# Patient Record
Sex: Female | Born: 1935 | Race: White | Hispanic: No | Smoking: Former smoker
Health system: Southern US, Community
[De-identification: ages and names within clinical notes are randomized; demographics above are authoritative.]

## PROBLEM LIST (undated history)

## (undated) DIAGNOSIS — I509 Heart failure, unspecified: Secondary | ICD-10-CM

## (undated) DIAGNOSIS — E119 Type 2 diabetes mellitus without complications: Secondary | ICD-10-CM

## (undated) DIAGNOSIS — E079 Disorder of thyroid, unspecified: Secondary | ICD-10-CM

## (undated) DIAGNOSIS — E785 Hyperlipidemia, unspecified: Secondary | ICD-10-CM

## (undated) HISTORY — DX: Disorder of thyroid, unspecified: E07.9

## (undated) HISTORY — DX: Type 2 diabetes mellitus without complications: E11.9

## (undated) HISTORY — DX: Hyperlipidemia, unspecified: E78.5

## (undated) HISTORY — DX: Heart failure, unspecified: I50.9

---

## 2001-12-01 ENCOUNTER — Other Ambulatory Visit: Admission: RE | Admit: 2001-12-01 | Discharge: 2001-12-01 | Payer: Self-pay

## 2002-12-05 ENCOUNTER — Other Ambulatory Visit: Admission: RE | Admit: 2002-12-05 | Discharge: 2002-12-05 | Payer: Self-pay | Admitting: Obstetrics & Gynecology

## 2004-06-22 ENCOUNTER — Other Ambulatory Visit: Admission: RE | Admit: 2004-06-22 | Discharge: 2004-06-22 | Payer: Self-pay | Admitting: Obstetrics and Gynecology

## 2006-04-07 ENCOUNTER — Inpatient Hospital Stay (HOSPITAL_COMMUNITY): Admission: RE | Admit: 2006-04-07 | Discharge: 2006-04-13 | Payer: Self-pay | Admitting: Orthopaedic Surgery

## 2006-04-08 ENCOUNTER — Ambulatory Visit: Payer: Self-pay | Admitting: Physical Medicine & Rehabilitation

## 2006-05-11 ENCOUNTER — Other Ambulatory Visit: Admission: RE | Admit: 2006-05-11 | Discharge: 2006-05-11 | Payer: Self-pay | Admitting: Obstetrics and Gynecology

## 2006-05-25 ENCOUNTER — Ambulatory Visit (HOSPITAL_COMMUNITY): Admission: RE | Admit: 2006-05-25 | Discharge: 2006-05-25 | Payer: Self-pay | Admitting: Internal Medicine

## 2010-09-26 ENCOUNTER — Encounter: Payer: Self-pay | Admitting: Specialist

## 2012-04-27 DIAGNOSIS — E039 Hypothyroidism, unspecified: Secondary | ICD-10-CM | POA: Diagnosis not present

## 2012-04-27 DIAGNOSIS — R609 Edema, unspecified: Secondary | ICD-10-CM | POA: Diagnosis not present

## 2012-04-27 DIAGNOSIS — Z23 Encounter for immunization: Secondary | ICD-10-CM | POA: Diagnosis not present

## 2012-04-27 DIAGNOSIS — E7800 Pure hypercholesterolemia, unspecified: Secondary | ICD-10-CM | POA: Diagnosis not present

## 2012-04-27 DIAGNOSIS — F329 Major depressive disorder, single episode, unspecified: Secondary | ICD-10-CM | POA: Diagnosis not present

## 2012-04-27 DIAGNOSIS — Z79899 Other long term (current) drug therapy: Secondary | ICD-10-CM | POA: Diagnosis not present

## 2013-05-01 DIAGNOSIS — Z1331 Encounter for screening for depression: Secondary | ICD-10-CM | POA: Diagnosis not present

## 2013-05-01 DIAGNOSIS — Z79899 Other long term (current) drug therapy: Secondary | ICD-10-CM | POA: Diagnosis not present

## 2013-05-01 DIAGNOSIS — M1710 Unilateral primary osteoarthritis, unspecified knee: Secondary | ICD-10-CM | POA: Diagnosis not present

## 2013-05-01 DIAGNOSIS — M79609 Pain in unspecified limb: Secondary | ICD-10-CM | POA: Diagnosis not present

## 2013-05-01 DIAGNOSIS — F329 Major depressive disorder, single episode, unspecified: Secondary | ICD-10-CM | POA: Diagnosis not present

## 2013-05-01 DIAGNOSIS — IMO0002 Reserved for concepts with insufficient information to code with codable children: Secondary | ICD-10-CM | POA: Diagnosis not present

## 2013-05-01 DIAGNOSIS — E039 Hypothyroidism, unspecified: Secondary | ICD-10-CM | POA: Diagnosis not present

## 2013-06-13 DIAGNOSIS — M25579 Pain in unspecified ankle and joints of unspecified foot: Secondary | ICD-10-CM | POA: Diagnosis not present

## 2013-08-15 DIAGNOSIS — E039 Hypothyroidism, unspecified: Secondary | ICD-10-CM | POA: Diagnosis not present

## 2013-11-19 DIAGNOSIS — E039 Hypothyroidism, unspecified: Secondary | ICD-10-CM | POA: Diagnosis not present

## 2014-02-18 DIAGNOSIS — E039 Hypothyroidism, unspecified: Secondary | ICD-10-CM | POA: Diagnosis not present

## 2014-05-02 DIAGNOSIS — E7800 Pure hypercholesterolemia, unspecified: Secondary | ICD-10-CM | POA: Diagnosis not present

## 2014-05-02 DIAGNOSIS — Z79899 Other long term (current) drug therapy: Secondary | ICD-10-CM | POA: Diagnosis not present

## 2014-05-02 DIAGNOSIS — F411 Generalized anxiety disorder: Secondary | ICD-10-CM | POA: Diagnosis not present

## 2014-05-02 DIAGNOSIS — Z23 Encounter for immunization: Secondary | ICD-10-CM | POA: Diagnosis not present

## 2014-05-02 DIAGNOSIS — E039 Hypothyroidism, unspecified: Secondary | ICD-10-CM | POA: Diagnosis not present

## 2014-06-10 DIAGNOSIS — F4320 Adjustment disorder, unspecified: Secondary | ICD-10-CM | POA: Diagnosis not present

## 2014-06-24 DIAGNOSIS — F411 Generalized anxiety disorder: Secondary | ICD-10-CM | POA: Diagnosis not present

## 2014-06-24 DIAGNOSIS — F331 Major depressive disorder, recurrent, moderate: Secondary | ICD-10-CM | POA: Diagnosis not present

## 2014-07-04 DIAGNOSIS — F331 Major depressive disorder, recurrent, moderate: Secondary | ICD-10-CM | POA: Diagnosis not present

## 2014-07-04 DIAGNOSIS — F411 Generalized anxiety disorder: Secondary | ICD-10-CM | POA: Diagnosis not present

## 2014-07-29 DIAGNOSIS — F411 Generalized anxiety disorder: Secondary | ICD-10-CM | POA: Diagnosis not present

## 2014-07-29 DIAGNOSIS — F331 Major depressive disorder, recurrent, moderate: Secondary | ICD-10-CM | POA: Diagnosis not present

## 2014-08-05 DIAGNOSIS — F331 Major depressive disorder, recurrent, moderate: Secondary | ICD-10-CM | POA: Diagnosis not present

## 2014-08-05 DIAGNOSIS — F411 Generalized anxiety disorder: Secondary | ICD-10-CM | POA: Diagnosis not present

## 2014-08-06 ENCOUNTER — Ambulatory Visit: Payer: Self-pay | Admitting: Gynecology

## 2014-08-21 DIAGNOSIS — F411 Generalized anxiety disorder: Secondary | ICD-10-CM | POA: Diagnosis not present

## 2014-08-21 DIAGNOSIS — F331 Major depressive disorder, recurrent, moderate: Secondary | ICD-10-CM | POA: Diagnosis not present

## 2014-08-26 DIAGNOSIS — F411 Generalized anxiety disorder: Secondary | ICD-10-CM | POA: Diagnosis not present

## 2014-08-26 DIAGNOSIS — F331 Major depressive disorder, recurrent, moderate: Secondary | ICD-10-CM | POA: Diagnosis not present

## 2014-09-16 DIAGNOSIS — M19072 Primary osteoarthritis, left ankle and foot: Secondary | ICD-10-CM | POA: Diagnosis not present

## 2014-09-16 DIAGNOSIS — M179 Osteoarthritis of knee, unspecified: Secondary | ICD-10-CM | POA: Diagnosis not present

## 2014-09-16 DIAGNOSIS — F419 Anxiety disorder, unspecified: Secondary | ICD-10-CM | POA: Diagnosis not present

## 2014-09-16 DIAGNOSIS — M19071 Primary osteoarthritis, right ankle and foot: Secondary | ICD-10-CM | POA: Diagnosis not present

## 2014-09-16 DIAGNOSIS — F329 Major depressive disorder, single episode, unspecified: Secondary | ICD-10-CM | POA: Diagnosis not present

## 2014-09-16 DIAGNOSIS — E039 Hypothyroidism, unspecified: Secondary | ICD-10-CM | POA: Diagnosis not present

## 2014-09-17 DIAGNOSIS — F331 Major depressive disorder, recurrent, moderate: Secondary | ICD-10-CM | POA: Diagnosis not present

## 2014-09-17 DIAGNOSIS — F411 Generalized anxiety disorder: Secondary | ICD-10-CM | POA: Diagnosis not present

## 2014-10-15 DIAGNOSIS — F411 Generalized anxiety disorder: Secondary | ICD-10-CM | POA: Diagnosis not present

## 2014-10-15 DIAGNOSIS — F331 Major depressive disorder, recurrent, moderate: Secondary | ICD-10-CM | POA: Diagnosis not present

## 2014-12-30 DIAGNOSIS — F331 Major depressive disorder, recurrent, moderate: Secondary | ICD-10-CM | POA: Diagnosis not present

## 2014-12-30 DIAGNOSIS — F411 Generalized anxiety disorder: Secondary | ICD-10-CM | POA: Diagnosis not present

## 2015-04-20 DIAGNOSIS — J209 Acute bronchitis, unspecified: Secondary | ICD-10-CM | POA: Diagnosis not present

## 2015-04-29 ENCOUNTER — Ambulatory Visit
Admission: RE | Admit: 2015-04-29 | Discharge: 2015-04-29 | Disposition: A | Discharge status: Home / Self Care | Payer: Medicare Other | Source: Ambulatory Visit | Attending: Internal Medicine | Admitting: Internal Medicine

## 2015-04-29 ENCOUNTER — Other Ambulatory Visit: Payer: Self-pay | Admitting: Internal Medicine

## 2015-04-29 DIAGNOSIS — R05 Cough: Secondary | ICD-10-CM | POA: Diagnosis not present

## 2015-04-29 DIAGNOSIS — J209 Acute bronchitis, unspecified: Secondary | ICD-10-CM | POA: Diagnosis not present

## 2015-04-29 DIAGNOSIS — R0602 Shortness of breath: Secondary | ICD-10-CM | POA: Diagnosis not present

## 2015-05-07 ENCOUNTER — Other Ambulatory Visit (HOSPITAL_COMMUNITY): Payer: Self-pay | Admitting: Respiratory Therapy

## 2015-05-07 DIAGNOSIS — R946 Abnormal results of thyroid function studies: Secondary | ICD-10-CM | POA: Diagnosis not present

## 2015-05-07 DIAGNOSIS — R05 Cough: Secondary | ICD-10-CM | POA: Diagnosis not present

## 2015-05-07 DIAGNOSIS — R053 Chronic cough: Secondary | ICD-10-CM

## 2015-05-07 DIAGNOSIS — E785 Hyperlipidemia, unspecified: Secondary | ICD-10-CM | POA: Diagnosis not present

## 2015-05-07 DIAGNOSIS — R600 Localized edema: Secondary | ICD-10-CM | POA: Diagnosis not present

## 2015-05-07 DIAGNOSIS — F325 Major depressive disorder, single episode, in full remission: Secondary | ICD-10-CM | POA: Diagnosis not present

## 2015-05-07 DIAGNOSIS — E882 Lipomatosis, not elsewhere classified: Secondary | ICD-10-CM | POA: Diagnosis not present

## 2015-05-07 DIAGNOSIS — R5383 Other fatigue: Secondary | ICD-10-CM | POA: Diagnosis not present

## 2015-05-07 DIAGNOSIS — E039 Hypothyroidism, unspecified: Secondary | ICD-10-CM | POA: Diagnosis not present

## 2015-05-14 ENCOUNTER — Encounter (HOSPITAL_COMMUNITY): Payer: Medicare Other

## 2015-08-07 DIAGNOSIS — F325 Major depressive disorder, single episode, in full remission: Secondary | ICD-10-CM | POA: Diagnosis not present

## 2015-08-07 DIAGNOSIS — Z1389 Encounter for screening for other disorder: Secondary | ICD-10-CM | POA: Diagnosis not present

## 2015-08-07 DIAGNOSIS — E039 Hypothyroidism, unspecified: Secondary | ICD-10-CM | POA: Diagnosis not present

## 2015-11-24 DIAGNOSIS — M545 Low back pain: Secondary | ICD-10-CM | POA: Diagnosis not present

## 2016-06-28 DIAGNOSIS — Z0000 Encounter for general adult medical examination without abnormal findings: Secondary | ICD-10-CM | POA: Diagnosis not present

## 2016-06-28 DIAGNOSIS — Z1389 Encounter for screening for other disorder: Secondary | ICD-10-CM | POA: Diagnosis not present

## 2016-06-28 DIAGNOSIS — R600 Localized edema: Secondary | ICD-10-CM | POA: Diagnosis not present

## 2016-06-28 DIAGNOSIS — F325 Major depressive disorder, single episode, in full remission: Secondary | ICD-10-CM | POA: Diagnosis not present

## 2016-06-28 DIAGNOSIS — Z13820 Encounter for screening for osteoporosis: Secondary | ICD-10-CM | POA: Diagnosis not present

## 2016-06-28 DIAGNOSIS — Z23 Encounter for immunization: Secondary | ICD-10-CM | POA: Diagnosis not present

## 2016-06-28 DIAGNOSIS — E039 Hypothyroidism, unspecified: Secondary | ICD-10-CM | POA: Diagnosis not present

## 2016-06-28 DIAGNOSIS — R05 Cough: Secondary | ICD-10-CM | POA: Diagnosis not present

## 2016-06-28 DIAGNOSIS — E559 Vitamin D deficiency, unspecified: Secondary | ICD-10-CM | POA: Diagnosis not present

## 2016-06-28 DIAGNOSIS — R5383 Other fatigue: Secondary | ICD-10-CM | POA: Diagnosis not present

## 2016-06-28 DIAGNOSIS — R159 Full incontinence of feces: Secondary | ICD-10-CM | POA: Diagnosis not present

## 2016-06-28 DIAGNOSIS — E882 Lipomatosis, not elsewhere classified: Secondary | ICD-10-CM | POA: Diagnosis not present

## 2016-06-28 DIAGNOSIS — E785 Hyperlipidemia, unspecified: Secondary | ICD-10-CM | POA: Diagnosis not present

## 2016-06-28 DIAGNOSIS — F329 Major depressive disorder, single episode, unspecified: Secondary | ICD-10-CM | POA: Diagnosis not present

## 2016-07-05 DIAGNOSIS — F331 Major depressive disorder, recurrent, moderate: Secondary | ICD-10-CM | POA: Diagnosis not present

## 2016-07-05 DIAGNOSIS — F411 Generalized anxiety disorder: Secondary | ICD-10-CM | POA: Diagnosis not present

## 2016-07-26 DIAGNOSIS — Z780 Asymptomatic menopausal state: Secondary | ICD-10-CM | POA: Diagnosis not present

## 2016-07-26 DIAGNOSIS — Z13820 Encounter for screening for osteoporosis: Secondary | ICD-10-CM | POA: Diagnosis not present

## 2016-07-26 DIAGNOSIS — M810 Age-related osteoporosis without current pathological fracture: Secondary | ICD-10-CM | POA: Diagnosis not present

## 2016-11-03 DIAGNOSIS — M810 Age-related osteoporosis without current pathological fracture: Secondary | ICD-10-CM | POA: Diagnosis not present

## 2017-01-19 ENCOUNTER — Encounter: Payer: Self-pay | Admitting: Gynecology

## 2017-04-14 DIAGNOSIS — E785 Hyperlipidemia, unspecified: Secondary | ICD-10-CM | POA: Diagnosis not present

## 2017-04-14 DIAGNOSIS — R600 Localized edema: Secondary | ICD-10-CM | POA: Diagnosis not present

## 2017-04-14 DIAGNOSIS — F411 Generalized anxiety disorder: Secondary | ICD-10-CM | POA: Diagnosis not present

## 2017-04-14 DIAGNOSIS — M810 Age-related osteoporosis without current pathological fracture: Secondary | ICD-10-CM | POA: Diagnosis not present

## 2017-04-14 DIAGNOSIS — E039 Hypothyroidism, unspecified: Secondary | ICD-10-CM | POA: Diagnosis not present

## 2017-04-14 DIAGNOSIS — F329 Major depressive disorder, single episode, unspecified: Secondary | ICD-10-CM | POA: Diagnosis not present

## 2017-04-14 DIAGNOSIS — M179 Osteoarthritis of knee, unspecified: Secondary | ICD-10-CM | POA: Diagnosis not present

## 2017-04-14 DIAGNOSIS — M25471 Effusion, right ankle: Secondary | ICD-10-CM | POA: Diagnosis not present

## 2017-04-14 DIAGNOSIS — Z79899 Other long term (current) drug therapy: Secondary | ICD-10-CM | POA: Diagnosis not present

## 2017-04-15 DIAGNOSIS — M25475 Effusion, left foot: Secondary | ICD-10-CM | POA: Diagnosis not present

## 2017-04-15 DIAGNOSIS — M810 Age-related osteoporosis without current pathological fracture: Secondary | ICD-10-CM | POA: Diagnosis not present

## 2017-04-15 DIAGNOSIS — F419 Anxiety disorder, unspecified: Secondary | ICD-10-CM | POA: Diagnosis not present

## 2017-04-15 DIAGNOSIS — M179 Osteoarthritis of knee, unspecified: Secondary | ICD-10-CM | POA: Diagnosis not present

## 2017-04-15 DIAGNOSIS — E7800 Pure hypercholesterolemia, unspecified: Secondary | ICD-10-CM | POA: Diagnosis not present

## 2017-04-15 DIAGNOSIS — M19072 Primary osteoarthritis, left ankle and foot: Secondary | ICD-10-CM | POA: Diagnosis not present

## 2017-04-15 DIAGNOSIS — R269 Unspecified abnormalities of gait and mobility: Secondary | ICD-10-CM | POA: Diagnosis not present

## 2017-04-15 DIAGNOSIS — Z79899 Other long term (current) drug therapy: Secondary | ICD-10-CM | POA: Diagnosis not present

## 2017-04-15 DIAGNOSIS — E039 Hypothyroidism, unspecified: Secondary | ICD-10-CM | POA: Diagnosis not present

## 2017-04-15 DIAGNOSIS — M25474 Effusion, right foot: Secondary | ICD-10-CM | POA: Diagnosis not present

## 2017-04-15 DIAGNOSIS — M19071 Primary osteoarthritis, right ankle and foot: Secondary | ICD-10-CM | POA: Diagnosis not present

## 2017-04-15 DIAGNOSIS — F329 Major depressive disorder, single episode, unspecified: Secondary | ICD-10-CM | POA: Diagnosis not present

## 2017-04-18 DIAGNOSIS — M25474 Effusion, right foot: Secondary | ICD-10-CM | POA: Diagnosis not present

## 2017-04-18 DIAGNOSIS — M25475 Effusion, left foot: Secondary | ICD-10-CM | POA: Diagnosis not present

## 2017-04-19 DIAGNOSIS — M25474 Effusion, right foot: Secondary | ICD-10-CM | POA: Diagnosis not present

## 2017-04-19 DIAGNOSIS — M25475 Effusion, left foot: Secondary | ICD-10-CM | POA: Diagnosis not present

## 2017-04-21 DIAGNOSIS — M25475 Effusion, left foot: Secondary | ICD-10-CM | POA: Diagnosis not present

## 2017-04-21 DIAGNOSIS — M25474 Effusion, right foot: Secondary | ICD-10-CM | POA: Diagnosis not present

## 2017-04-22 DIAGNOSIS — M25475 Effusion, left foot: Secondary | ICD-10-CM | POA: Diagnosis not present

## 2017-04-22 DIAGNOSIS — M25474 Effusion, right foot: Secondary | ICD-10-CM | POA: Diagnosis not present

## 2017-04-26 DIAGNOSIS — M25474 Effusion, right foot: Secondary | ICD-10-CM | POA: Diagnosis not present

## 2017-04-26 DIAGNOSIS — M25475 Effusion, left foot: Secondary | ICD-10-CM | POA: Diagnosis not present

## 2017-04-27 DIAGNOSIS — M25474 Effusion, right foot: Secondary | ICD-10-CM | POA: Diagnosis not present

## 2017-04-27 DIAGNOSIS — M25475 Effusion, left foot: Secondary | ICD-10-CM | POA: Diagnosis not present

## 2017-04-28 DIAGNOSIS — M25474 Effusion, right foot: Secondary | ICD-10-CM | POA: Diagnosis not present

## 2017-04-28 DIAGNOSIS — M25475 Effusion, left foot: Secondary | ICD-10-CM | POA: Diagnosis not present

## 2017-05-03 DIAGNOSIS — M25475 Effusion, left foot: Secondary | ICD-10-CM | POA: Diagnosis not present

## 2017-05-03 DIAGNOSIS — M25474 Effusion, right foot: Secondary | ICD-10-CM | POA: Diagnosis not present

## 2017-05-04 ENCOUNTER — Other Ambulatory Visit: Payer: Self-pay | Admitting: Pharmacist

## 2017-05-04 DIAGNOSIS — Z598 Other problems related to housing and economic circumstances: Secondary | ICD-10-CM

## 2017-05-04 DIAGNOSIS — M25475 Effusion, left foot: Secondary | ICD-10-CM | POA: Diagnosis not present

## 2017-05-04 DIAGNOSIS — M25474 Effusion, right foot: Secondary | ICD-10-CM | POA: Diagnosis not present

## 2017-05-04 DIAGNOSIS — Z599 Problem related to housing and economic circumstances, unspecified: Secondary | ICD-10-CM

## 2017-05-04 NOTE — Patient Outreach (Signed)
[facility]) Care Management  [facility]   05/04/2017  Sue [name] June 27, 1930 [id number]  Subjective: Sue [name] was referred to pharmacy by her PCP office for medication review. Called and spoke with patient. HIPAA identifiers verified and verbal consent received. Review with patient each of her medications including name, strength, indication and administration. Patient confirms that her current PCP is Dr. Marden Noble at [facility].  Reports that she currently has a nurse coming from [facility] a couple of times/week. Reports that she is learning to fill her own pillbox with nurse from [facility]. States that he fills it with her once a week. Reports that her son also helps when she needs assistance with managing her medications.   Reports that she does have difficulty with affording her medications as she does not currently have Medicare Part D coverage. Reports that she had a Part D plan in the past, but that it was too expensive for her to maintain and that now she only has Parts A and B. Reports that she pays for her medications each month when she receives her check from [employer]. Reports that she currently has 6 days of her medications left and that she will get all of her prescriptions refilled when she receives her check on Monday. Counsel patient on the importance of medication adherence. Advise patient that she might qualify for reduced deductible and copayments for her Part D plan if she qualifies for Extra Help through Social Security. Patient reports that she is not currently interested in signing up for a Part D plan. Advise patient to consider scheduling and appointment with a Seniors' [employer] Information Program [facility]) counselor. Sue [name] states that she will give [facility] a call.  Patient reports that she is uncertain about whether she might qualify for Medicaid and other community resources. Patient is interested  in speaking with a [facility] psychotherapist. Will send a referral.  Counsel patient about the risk of dizziness and cognitive impairment with clonazepam. Patient denies any dizziness or sedation with the medication. Denies any falls in the past year.  Reports that she takes both ibuprofen and meloxicam on a scheduled basis for leg pain. 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 Let patient know that I will call to follow up with her PCP about this duplication.  Patient reports that she had been taking Vitamin D3 as directed by Dr. Kevan Ny, but that she discontinued this when a nurse from [facility] told her that she didn't need to keep taking it. Reports that Dr. Kevan Ny had called in a prescription for weekly alendronate for her to her pharmacy, but that she never started taking it because she did not feel like it had been communicated to her by her PCP that she needed to start taking it. Sue [name] reports that she took citalopram in the past, but is not taking it now and is not sure why or when she stopped taking this medication. Let patient know that I will follow up with her PCP about the alendronate, Vitamin D3 and citalopram as well.  Patient denies any further medication questions/concerns at this time. Provide patient with my phone number.   Objective:   Encounter Medications: Outpatient Encounter Prescriptions as of 05/04/2017  Medication Sig  . buPROPion (WELLBUTRIN SR) 150 MG 12 hr tablet Take 150 mg by mouth daily.  . clonazePAM (KLONOPIN) 0.5 MG tablet Take 0.5 mg by mouth 2 (  two) times daily as needed for anxiety.  . furosemide (LASIX) 40 MG tablet Take 40 mg by mouth every morning.  Marland Kitchen ibuprofen (ADVIL,MOTRIN) 800 MG tablet Take 800 mg by mouth every 8 (eight) hours as needed.  Marland Kitchen levothyroxine (SYNTHROID, LEVOTHROID) 100 MCG tablet Take 100 mcg by mouth daily before breakfast.  . meloxicam (MOBIC) 15 MG tablet Take 15 mg by mouth daily.  .  potassium chloride (K-DUR,KLOR-CON) 10 MEQ tablet Take 20 mEq by mouth every morning.  . simvastatin (ZOCOR) 20 MG tablet Take 20 mg by mouth daily.   No facility-administered encounter medications on file as of 05/04/2017.     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 Patient counseled Other issues noted:  Patient currently taking Wellbutrin SR once daily, typically dosed twice daily due to pharmacokinetics. PCP contacted.   Plan:  1) Will send a referral to [facility] Social Worker to request follow up with patient about whether she might qualify for Medicaid and other community resources.  2) Will call to follow up with patient's PCP regarding:  A) NSAID therapeutic duplication (ibuprofen and meloxicam)   B) Whether patient should resume taking Vitamin D3  C) Whether patient is to start taking alendronate  D) Verify dosing schedule and release form of the patient's Wellbutrin.  E) Confirm that patient was to have stopped taking citalopram.  3) Will call to follow up with patient once I hear back from patient's PCP  4) Patient to follow up with [facility] counselor.   Duanne Moron, PharmD, [facility] Clinical Pharmacist [facility] Care Management [phone]

## 2017-05-06 ENCOUNTER — Other Ambulatory Visit: Payer: Self-pay | Admitting: Pharmacist

## 2017-05-06 DIAGNOSIS — M25474 Effusion, right foot: Secondary | ICD-10-CM | POA: Diagnosis not present

## 2017-05-06 DIAGNOSIS — M25475 Effusion, left foot: Secondary | ICD-10-CM | POA: Diagnosis not present

## 2017-05-06 NOTE — Patient Outreach (Signed)
Call to follow up with patient's PCP regarding:  A) NSAID therapeutic duplication (ibuprofen and meloxicam)   B) Whether patient should resume taking Vitamin D3  C) Whether patient is to start taking alendronate  D) Verify dosing schedule and release form of the patient's Wellbutrin.  E) Confirm that patient was to have stopped taking citalopram.  Leave a message with Katrina in Dr. Kevan NyGates' office.  Duanne MoronElisabeth Geoffery Aultman, PharmD, [facility]BCACP Clinical Pharmacist [facility] Care Management [phone]

## 2017-05-06 NOTE — Patient Outreach (Signed)
Receive a message from Katrina in Dr. Kevan NyGates' office in response to my message from 05/04/17. Per Katrina, Dr. Kevan NyGates instructs that patient is to:  1) Stop taking ibuprofen and to continue meloxicam 2) Not to take citalopram 3) Continue Wellbutrin SR 150 mg once daily 4) Resume taking Vitamin D3 as directed 5) Start taking the alendronate once weekly.  Reports that she has spoken with the patient to let her know about each of these instructions.  Call to follow up with [name] regarding medication management. HIPAA identifiers verified and verbal consent received. [name] reports that she did speak to [place]. Review medication changes/instructions from Katrina/Dr. Kevan NyGates. Patient also reports that she requested that Dr. Kevan NyGates' office email her an updated medication list for further clarification. Reports that she has not yet received this updated list, but is hoping to today. Patient thanks me for my help and asks that I call again next week.  Will call patient again on 05/11/17 to follow up about her medication management.  Duanne MoronElisabeth Bryann Mcnealy, PharmD, [facility] Clinical Pharmacist [facility] Care Management [phone]

## 2017-05-10 NOTE — Addendum Note (Signed)
Addended by: Duanne MoronHALLA, Mayola Mcbain A on: 05/10/2017 06:31 PM   Modules accepted: Orders

## 2017-05-11 ENCOUNTER — Encounter: Payer: Self-pay | Admitting: *Deleted

## 2017-05-11 ENCOUNTER — Other Ambulatory Visit: Payer: Self-pay | Admitting: Pharmacist

## 2017-05-11 NOTE — Patient Outreach (Addendum)
Call to follow-up with [name] regarding her medication management. Called and spoke with patient. HIPAA identifiers verified and verbal consent received.  Patient reports that she did receive her updated medication list from Dr. Kevan NyGates' office. Reports that she has all of her medications, except for her potassium. Reports that she is going to pick up her potassium from her pharmacy today, but that she had to wait until today because the pharmacy was out of stock.  Reports that she has asked the [place] [facility] [place] [facility] [place] [facility] to stop coming as she found some of the things that he said to be unkind. Reports that in his place she will have her son come once a week to help her to fill her pillbox. Expresses confidence that her son will be able to come once a week to do this for her accurately.   Reports that she has not yet called to speak with the counselor from the Seniors' [employer] Insurance Information Program [facility]II[facility]), but reports that she will call to schedule an appointment. Reports that her income is just too high for her to qualify for extra help. Counsel patient on the benefits of signing up for a Part D plan for the coming year. Patient verbalizes understanding.  [name] reports no further medication questions/concerns at this time, but confirms that she has my phone number and will call if she has further questions.  Will close pharmacy episode at this time.  Sue [name], PharmD, [facility] Clinical Pharmacist [facility] Care Management [phone]

## 2017-05-12 ENCOUNTER — Encounter: Payer: Self-pay | Admitting: *Deleted

## 2017-05-12 ENCOUNTER — Other Ambulatory Visit: Payer: Self-pay | Admitting: *Deleted

## 2017-05-12 NOTE — Patient Outreach (Signed)
[facility]) Care Management  05/12/2017  Sue NeighborJocelyn J [name] 1930/01/13 [id number]   CSW was able to make initial contact with patient today to perform phone assessment, as well as assess and assist with social work needs and services.  CSW introduced self, explained role and types of services provided through [facility] Care Management [facility] Care Management).  CSW further explained to patient that CSW works with patient's Pharmacist, also with [facility] Care [facility], Sue [name]. CSW then explained the reason for the call, indicating that Mrs. [name] thought that patient would benefit from social work services and resources to assist with applying for Adult Medicaid coverage through the [facility] [place] Department of [facility]. CSW obtained two HIPAA compliant identifiers from patient, which included patient's name and date of birth. Patient admitted that she is interested in applying for Adult Medicaid through the [facility] Department of [facility], wanting CSW to assist her with the application process.  CSW began completing the application with patient over the phone, per patient's request, but stopped after learning that patient makes over $1,500.00 per month in [employer] Income.  The Poverty Guidelines for the [place] for fiscal year 2018/2019 is $1012.00, meaning that patient is well over-qualified.  Patient did not wish to proceed with completing the application.  CSW inquired about Food Stamps/EBT, also through the [facility] Department of [facility] HealthcareSocial Services, which patient is already receiving.  Patient reported that she only receives $16.00 per month, but is grateful, realizing that "every little bit helps".  CSW agreed to mail the following list of resources to patient: Consent for Treatment [facility] Care Management Welcome Packet The Little Green Book - Free Meals in [place] The [place] Book - Advice workerood  Banks and Stage managerood Pantries in [place] List of [employer]Community Agencies and KeyCorpesources Offering Financial Assistance CSW will then follow-up with patient in one week to ensure that she received the packet of information, as well as answer any questions she may have at that time.  CSW will also assist with referrals to various agencies, if necessary.  Patient voiced understanding and was agreeable to this plan. Sue BadJoanna [name], BSW, MSW, LCSW  Licensed Restaurant manager, fast foodClinical Social Worker  [facility] Care Management [place] [facility]  Mailing [place], [place], [place] [address] Physical Address-[address], [place], [place] [address] Toll Free Main # [phone] Fax # [phone] Cell # [phone]  Office # [phone] [name]

## 2017-05-12 NOTE — Patient Outreach (Signed)
  [facility]) Care Management  05/12/2017  Sue NeighborJocelyn J [name] [birth year] [id number]   Erroneous Encounter. Danford BadJoanna Saporito, BSW, MSW, LCSW  Licensed Restaurant manager, fast foodClinical Social Worker  [facility] Care Management [place] [facility]  Mailing [place]-[address], [place], [place] [address] Physical Address-[address], [place], [place] [address] Toll Free Main # [phone] Fax # [phone] Cell # [phone]  Office # [phone] [email or web address]

## 2017-05-13 NOTE — Patient Outreach (Signed)
Request received from [name], LCSW to mail patient personal care resources.  Information mailed today. 

## 2017-05-20 ENCOUNTER — Other Ambulatory Visit: Payer: Self-pay | Admitting: *Deleted
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
## 2017-05-24 ENCOUNTER — Other Ambulatory Visit: Payer: Self-pay | Admitting: *Deleted

## 2017-05-24 ENCOUNTER — Encounter: Payer: Self-pay | Admitting: *Deleted

## 2017-05-24 NOTE — Patient Outreach (Signed)
[facility]) Care Management  05/24/2017  Sue [name] [birth year] [id number]   CSW was able to make contact with patient today to follow-up regarding social work services and resources, as well as to ensure that patient received the packet of resource information that CSW mailed to patient's home. Patient admitted that she received the resources yesterday and that she has already had an opportunity to review the information.  Patient found the information and resources to be extremely helpful and plans to utilize the list of agencies that may be able to offer financial assistance.  Patient indicated that she would contact CSW directly if she has any additional questions and/or concerns, requesting that CSW not make additional outreach attempts in the future.  CSW voiced understanding and was agreeable to this plan. CSW will perform a case closure on patient, as all goals of treatment have been met from social work standpoint and no additional social work needs have been identified at this time.  CSW will fax an update to patient's Primary Care Physician, Dr. Josetta Huddle to ensure that they are aware of CSW's involvement with patient's plan of care.  CSW will submit a case closure request to Verlon Setting, Care Management Assistant with [place] Management, in the form of an In [employer].   Nat Christen, BSW, MSW, LCSW  Licensed Education officer, environmental Health System  Mailing [place] [address], [place], [place] [address] Physical Address-[address], [place], [place] [address] Toll Free Main # [phone] Fax # [phone] Cell # [phone]  Office # [phone] [email or web address]

## 2017-05-26 DIAGNOSIS — E039 Hypothyroidism, unspecified: Secondary | ICD-10-CM | POA: Diagnosis not present

## 2017-05-26 DIAGNOSIS — F411 Generalized anxiety disorder: Secondary | ICD-10-CM | POA: Diagnosis not present

## 2017-05-26 DIAGNOSIS — R600 Localized edema: Secondary | ICD-10-CM | POA: Diagnosis not present

## 2017-05-26 DIAGNOSIS — M179 Osteoarthritis of knee, unspecified: Secondary | ICD-10-CM | POA: Diagnosis not present

## 2017-05-26 DIAGNOSIS — M810 Age-related osteoporosis without current pathological fracture: Secondary | ICD-10-CM | POA: Diagnosis not present

## 2017-05-26 DIAGNOSIS — R159 Full incontinence of feces: Secondary | ICD-10-CM | POA: Diagnosis not present

## 2017-05-26 DIAGNOSIS — Z23 Encounter for immunization: Secondary | ICD-10-CM | POA: Diagnosis not present

## 2017-05-26 DIAGNOSIS — F325 Major depressive disorder, single episode, in full remission: Secondary | ICD-10-CM | POA: Diagnosis not present

## 2017-05-26 DIAGNOSIS — E7800 Pure hypercholesterolemia, unspecified: Secondary | ICD-10-CM | POA: Diagnosis not present

## 2017-05-26 DIAGNOSIS — E559 Vitamin D deficiency, unspecified: Secondary | ICD-10-CM | POA: Diagnosis not present

## 2017-05-27 ENCOUNTER — Ambulatory Visit: Payer: Self-pay | Admitting: *Deleted

## 2017-10-27 DIAGNOSIS — Z79899 Other long term (current) drug therapy: Secondary | ICD-10-CM | POA: Diagnosis not present

## 2017-10-27 DIAGNOSIS — E559 Vitamin D deficiency, unspecified: Secondary | ICD-10-CM | POA: Diagnosis not present

## 2017-10-27 DIAGNOSIS — F411 Generalized anxiety disorder: Secondary | ICD-10-CM | POA: Diagnosis not present

## 2017-10-27 DIAGNOSIS — Z0000 Encounter for general adult medical examination without abnormal findings: Secondary | ICD-10-CM | POA: Diagnosis not present

## 2017-10-27 DIAGNOSIS — M179 Osteoarthritis of knee, unspecified: Secondary | ICD-10-CM | POA: Diagnosis not present

## 2017-10-27 DIAGNOSIS — E785 Hyperlipidemia, unspecified: Secondary | ICD-10-CM | POA: Diagnosis not present

## 2017-10-27 DIAGNOSIS — F325 Major depressive disorder, single episode, in full remission: Secondary | ICD-10-CM | POA: Diagnosis not present

## 2017-10-27 DIAGNOSIS — E039 Hypothyroidism, unspecified: Secondary | ICD-10-CM | POA: Diagnosis not present

## 2017-10-27 DIAGNOSIS — F329 Major depressive disorder, single episode, unspecified: Secondary | ICD-10-CM | POA: Diagnosis not present

## 2018-04-10 ENCOUNTER — Other Ambulatory Visit: Payer: Self-pay | Admitting: *Deleted

## 2018-04-10 DIAGNOSIS — R609 Edema, unspecified: Secondary | ICD-10-CM

## 2018-04-10 NOTE — Patient Outreach (Signed)
Telephone call to pt for follow up on completed Health Risk Assessment for HTA, spoke with pt, HIPAA verified, pt reports she has no issues with ambulation, no issues with bladder control (as states on HRA), no difficulty picking up medications from pharmacy (as stated on HRA), family assists, pt reports she is on medication for depression for years and it is " manageable".  Pt reports she has edema to lower extremities and this is chronic condition that she has had for quite some time, pt states she takes diuretic and plans to discuss with MD use of compression hose.  Pt reports she is aware of following low salt diet and keeping feet elevated.  Pt denies pain.  Pt reports she does not want a nurse or anyone in her home and biggest need is not being able to always afford her medication, pt agreeable to have [facility] outreach.  RN CM placed order for [facility] pharmac[facility] and mailed successful outreach letter to pt home.  Sue ShowsJulie Karlissa [name] [facility], BSN [facility] Community Care Coordinator [phone]

## 2018-04-14 ENCOUNTER — Telehealth: Payer: Self-pay | Admitting: Pharmacist

## 2018-04-14 NOTE — Telephone Encounter (Signed)
-----   Message from [name], [facility] sent at 04/14/2018  2:27 PM EDT ----- Regarding: FW: Referral: Order for Radford,Katyra J Here ya go! ----- Message ----- From: [name], [name] Sent: 04/11/2018  11:09 AM EDT To: Audrie GallusJulie A Farmer, RN, [name], [facility] Subject: Referral: Order for Candie EchevariaWEISS,[name]            Katina  Referral from Irving ShowsJulie Farmer, RN  "Please see below request"  Elza RafterLavelda ----- Message ----- From: Audrie GallusFarmer, Julie A, RN Sent: 04/10/2018   7:51 PM To: Thn Cm Communication Orders Subject: Order for Eleanora NeighborWEISS,[name]                       Patient Name: Sue CzarWEISS,Sue [name]([id number]) Sex: Female DOB: 12/13/1929    PCP: [name], [name]   Center: [facility]   Types of orders made on 04/10/2018: Nursing  Order Date:04/10/2018 Ordering User:FARMER, Zenaida DeedJULIE A [[id number]][[id number]] Encounter Provider:Farmer, Zenaida DeedJulie A, RN [[id number]][[id number]] Authorizing  Provider: [name], [name], MD [[id number]] Department:[facility]-COMMUNITY[[id number]]  Order Specific Information Order: Comm to Pharmacy [Custom: [id number]]  Order #: [id number] Qty: 1   Priority: Routine  Class: Clinic Performed   Comment:Pt contacted follow up health risk assessment for HTA insurance, pt            states she cannot afford meds and would like pharmacist to contact            her.   thanks   A ssociated Diagnoses     R60.9 Edema, unspecified type     Priority: Routine  Class: Clinic Performed   Comment:Pt contacted follow up health risk assessment for HTA insurance, pt            states she cannot afford meds and would like pharmacist to contact            her.   thanks   Associated Diagnoses     R60.9 Edema, unspecified type

## 2018-04-14 NOTE — Patient Outreach (Signed)
[facility]) Care Management  04/14/2018   Sue NeighborJocelyn J [name] [birth year] [id number]  Subjective:   Patient is a 84 year old female [facility] by Irving ShowsJulie Farmer, RN after HTA Health Risk Assessment.[facility].  [facility] Pharmacy services requested for medication management.  PMHx includes, but not limited to, depression/anxiety, hypothyroidism, vitamin D deficiency, and edema.   Patient expressed concern regarding her knowledge of her medications and indications. She feels like she isn't sure why she's taking certain medications, and has multiple medication lists from her PCP's office that have differing medication instructions.   She notes that she continues to have lower extremity edema, even with adherence to furosemide.   Objective:    Current Medications:  Current Outpatient Medications  Medication Sig Dispense Refill  . clonazePAM (KLONOPIN) 0.5 MG tablet Take 0.5 mg by mouth 2 (two) times daily as needed for anxiety.    Marland Kitchen. buPROPion (WELLBUTRIN SR) 150 MG 12 hr tablet Take 150 mg by mouth daily.    . furosemide (LASIX) 40 MG tablet Take 40 mg by mouth every morning.    Marland Kitchen. ibuprofen (ADVIL,MOTRIN) 800 MG tablet Take 800 mg by mouth every 8 (eight) hours as needed.    Marland Kitchen. levothyroxine (SYNTHROID, LEVOTHROID) 100 MCG tablet Take 100 mcg by mouth daily before breakfast.    . meloxicam (MOBIC) 15 MG tablet Take 15 mg by mouth daily.    . potassium chloride (K-DUR,KLOR-CON) 10 MEQ tablet Take 20 mEq by mouth every morning.    . simvastatin (ZOCOR) 20 MG tablet Take 20 mg by mouth daily.     No current facility-administered medications for this visit.     Functional Status:  In your present state of health, do you have any difficulty performing the following activities: 05/12/2017  Hearing? N  Vision? N  Difficulty concentrating or making decisions? N  Walking or climbing stairs? N  Dressing or bathing? N  Doing errands, shopping? N  Preparing Food and eating ? N   Using the Toilet? N  In the past six months, have you accidently leaked urine? N  Do you have problems with loss of bowel control? N  Managing your Medications? N  Managing your Finances? Y  Housekeeping or managing your Housekeeping? N  Some recent data might be hidden    Fall/Depression Screening: Fall Risk  05/12/2017  Falls in the past year? No   PHQ 2/9 Scores 05/12/2017  PHQ - 2 Score 0    Assessment:   Drugs sorted by system:  Neurologic/Psychologic: bupropion SR, clonazepam   Cardiovascular: furosemide, simvastatin  Vitamins/Minerals: potassium   Medications to avoid in the elderly:  - Clonazepam - This drug is identified in the [place] Criteria as a potentially inappropriate medication to be avoided in patients 65 years and older due to increased risk of impaired cognition, delirium, falls, fractures, and motor vehicle accidents with benzodiazepine use.  Plan:  - Scheduled home visit to review medications and dosing instructions - Will contact Dr. Kevan NyGates' office for the most updated medication list    Catie Feliz Beamravis, PharmD PGY2 Ambulatory Care Pharmacy Resident Phone: [phone]

## 2018-04-18 ENCOUNTER — Other Ambulatory Visit: Payer: Self-pay | Admitting: Pharmacist

## 2018-04-18 NOTE — Patient Outreach (Signed)
[facility]) Care Management  [facility]   04/18/2018  Sue NeighborJocelyn J [name] [birth year] [id number]  Subjective: Patient is a 84 year old female [facility] by Sue ShowsJulie Farmer, RN after [facility] Health Risk Assessment.Sue [name].  [facility] Pharmacy services requested for medication management.  PMHx includes, but not limited to, depression/anxiety, hypothyroidism, vitamin D deficiency, and edema.   During phone visit on 04/14/2018, patient expressed confusion regarding her medication regimen, and requested a pharmacy home visit.   Patient keeps her medications together in a basket. She used to use a weekly pill box, but has stopped doing so recently since she became confused about her intended medication regimens. She has multiple medication lists from previous physician visits that she compares medication instructions between, causing confusion about the current prescribed directions. She is particularly concerned about the intended dosing for furosemide and potassium. She is currently taking furosemide 20 mg BID and potassium 20 mEq BID.  She notes that her lower extremity edema is generally well controlled, but she does not often weigh herself.   Objective:   Encounter Medications: Outpatient Encounter Medications as of 04/18/2018  Medication Sig Note  . buPROPion (WELLBUTRIN SR) 150 MG 12 hr tablet Take 150 mg by mouth 2 (two) times daily.  04/14/2018: Taking twice daily   . clonazePAM (KLONOPIN) 0.5 MG tablet Take 0.5 mg by mouth 2 (two) times daily as needed for anxiety.   . furosemide (LASIX) 20 MG tablet Take 20 mg by mouth 2 (two) times daily.    Marland Kitchen. levothyroxine (SYNTHROID, LEVOTHROID) 100 MCG tablet Take 100 mcg by mouth daily before breakfast.   . meloxicam (MOBIC) 15 MG tablet Take 15 mg by mouth daily.   . potassium chloride (K-DUR,KLOR-CON) 10 MEQ tablet Take 20 mEq by mouth 2 (two) times daily.    . simvastatin (ZOCOR) 20 MG tablet Take 20 mg by mouth daily.   .  cholecalciferol (VITAMIN D) 1000 units tablet Take 1,000 Units by mouth daily.   . citalopram (CELEXA) 40 MG tablet Take 40 mg by mouth daily.   Marland Kitchen. ibuprofen (ADVIL,MOTRIN) 800 MG tablet Take 800 mg by mouth every 8 (eight) hours as needed.    No facility-administered encounter medications on file as of 04/18/2018.     Functional Status: In your present state of health, do you have any difficulty performing the following activities: 05/12/2017  Hearing? N  Vision? N  Difficulty concentrating or making decisions? N  Walking or climbing stairs? N  Dressing or bathing? N  Doing errands, shopping? N  Preparing Food and eating ? N  Using the Toilet? N  In the past six months, have you accidently leaked urine? N  Do you have problems with loss of bowel control? N  Managing your Medications? N  Managing your Finances? Y  Housekeeping or managing your Housekeeping? N  Some recent data might be hidden    Fall/Depression Screening: Fall Risk  05/12/2017  Falls in the past year? No   PHQ 2/9 Scores 05/12/2017  PHQ - 2 Score 0   Assessment:  Drugs sorted by [name]:  Neurologic/Psychologic: bupropion, citalopram, clonazepam  Cardiovascular: furosemide, simvastatin  Endocrine: levothyroxine  Pain: meloxicam  Vitamins/Minerals: vitamin D, potassium  Other issues noted:  - Received an updated medication list from Sue [name]. Lists intended furosemide dosing as 20 mg daily, but potassium as 20 mEq twice daily with furosemide. Also lists magnesium, though patient is not currently taking magnesium. She has also not  taking methocarbamol.  Plan: - Will route visit note to PCP Dr. Kevan [name] for clarification on furosemide, potassium, and magnesium dosing. Once received, will contact patient and mail updated medication list.    Sue [name], PharmD PGY2 Ambulatory Care Pharmacy Resident Phone: [phone]

## 2018-04-19 ENCOUNTER — Other Ambulatory Visit: Payer: Self-pay | Admitting: Pharmacist

## 2018-04-19 NOTE — Patient Outreach (Signed)
[facility]) Care Management  04/19/2018  Sue NeighborJocelyn J Bridwell 1930-07-30 [id number]   [facility]evan [facility]ates' office. Confirmed that patient is to be taking furosemide 20 mg once daily and potassium 10 mEq twice daily.   Contacted patient, HIPAA identifiers obtained. Relayed these dosing instructions, she voiced understanding.   Plan:  - Will mail updated medication list to [name] - Will fax case closure letter to PCP Dr. Kevan NyGates - Will close case at this time   Catie Feliz Beamravis, PharmD PGY2 Ambulatory Care Pharmacy Resident Phone: [phone]

## 2018-06-02 ENCOUNTER — Ambulatory Visit
Admission: RE | Admit: 2018-06-02 | Discharge: 2018-06-02 | Disposition: A | Discharge status: Home / Self Care | Payer: PPO | Source: Ambulatory Visit | Attending: Nurse Practitioner | Admitting: Nurse Practitioner

## 2018-06-02 ENCOUNTER — Other Ambulatory Visit: Payer: Self-pay | Admitting: Nurse Practitioner

## 2018-06-02 DIAGNOSIS — R739 Hyperglycemia, unspecified: Secondary | ICD-10-CM | POA: Diagnosis not present

## 2018-06-02 DIAGNOSIS — R0682 Tachypnea, not elsewhere classified: Secondary | ICD-10-CM | POA: Diagnosis not present

## 2018-06-02 DIAGNOSIS — I951 Orthostatic hypotension: Secondary | ICD-10-CM | POA: Diagnosis not present

## 2018-06-02 DIAGNOSIS — R05 Cough: Secondary | ICD-10-CM | POA: Diagnosis not present

## 2018-06-02 DIAGNOSIS — R062 Wheezing: Secondary | ICD-10-CM | POA: Diagnosis not present

## 2018-06-02 DIAGNOSIS — R5382 Chronic fatigue, unspecified: Secondary | ICD-10-CM | POA: Diagnosis not present

## 2018-06-02 DIAGNOSIS — I4510 Unspecified right bundle-branch block: Secondary | ICD-10-CM | POA: Diagnosis not present

## 2018-06-02 DIAGNOSIS — R000 Tachycardia, unspecified: Secondary | ICD-10-CM | POA: Diagnosis not present

## 2018-06-06 DIAGNOSIS — E039 Hypothyroidism, unspecified: Secondary | ICD-10-CM | POA: Diagnosis not present

## 2018-06-06 DIAGNOSIS — J40 Bronchitis, not specified as acute or chronic: Secondary | ICD-10-CM | POA: Diagnosis not present

## 2018-06-06 DIAGNOSIS — I509 Heart failure, unspecified: Secondary | ICD-10-CM | POA: Diagnosis not present

## 2018-06-09 ENCOUNTER — Other Ambulatory Visit (HOSPITAL_COMMUNITY): Payer: Self-pay | Admitting: Internal Medicine

## 2018-06-09 ENCOUNTER — Other Ambulatory Visit (HOSPITAL_COMMUNITY): Payer: Self-pay | Admitting: Pediatric Surgery

## 2018-06-09 DIAGNOSIS — I4510 Unspecified right bundle-branch block: Secondary | ICD-10-CM

## 2018-06-22 ENCOUNTER — Encounter: Payer: Self-pay | Admitting: Internal Medicine

## 2018-06-22 DIAGNOSIS — I509 Heart failure, unspecified: Secondary | ICD-10-CM | POA: Diagnosis not present

## 2018-06-28 ENCOUNTER — Other Ambulatory Visit (HOSPITAL_COMMUNITY): Payer: PPO

## 2018-06-30 ENCOUNTER — Other Ambulatory Visit: Payer: Self-pay

## 2018-06-30 ENCOUNTER — Ambulatory Visit (HOSPITAL_COMMUNITY): Payer: PPO | Attending: Internal Medicine

## 2018-06-30 DIAGNOSIS — I4510 Unspecified right bundle-branch block: Secondary | ICD-10-CM | POA: Diagnosis not present

## 2018-07-27 DIAGNOSIS — I509 Heart failure, unspecified: Secondary | ICD-10-CM | POA: Diagnosis not present

## 2018-07-27 DIAGNOSIS — F322 Major depressive disorder, single episode, severe without psychotic features: Secondary | ICD-10-CM | POA: Diagnosis not present

## 2018-07-27 DIAGNOSIS — Z23 Encounter for immunization: Secondary | ICD-10-CM | POA: Diagnosis not present

## 2018-07-27 DIAGNOSIS — M545 Low back pain: Secondary | ICD-10-CM | POA: Diagnosis not present

## 2018-07-27 DIAGNOSIS — F411 Generalized anxiety disorder: Secondary | ICD-10-CM | POA: Diagnosis not present

## 2018-07-27 DIAGNOSIS — E039 Hypothyroidism, unspecified: Secondary | ICD-10-CM | POA: Diagnosis not present

## 2018-08-01 ENCOUNTER — Ambulatory Visit (INDEPENDENT_AMBULATORY_CARE_PROVIDER_SITE_OTHER): Payer: PPO | Admitting: Cardiology

## 2018-08-01 ENCOUNTER — Encounter: Payer: Self-pay | Admitting: Cardiology

## 2018-08-01 VITALS — BP 132/80 | HR 81 | Ht 64.0 in | Wt 206.0 lb

## 2018-08-01 DIAGNOSIS — F329 Major depressive disorder, single episode, unspecified: Secondary | ICD-10-CM

## 2018-08-01 DIAGNOSIS — Z6835 Body mass index (BMI) 35.0-35.9, adult: Secondary | ICD-10-CM | POA: Diagnosis not present

## 2018-08-01 DIAGNOSIS — Z79899 Other long term (current) drug therapy: Secondary | ICD-10-CM

## 2018-08-01 DIAGNOSIS — Z7189 Other specified counseling: Secondary | ICD-10-CM

## 2018-08-01 DIAGNOSIS — I5042 Chronic combined systolic (congestive) and diastolic (congestive) heart failure: Secondary | ICD-10-CM | POA: Diagnosis not present

## 2018-08-01 DIAGNOSIS — R600 Localized edema: Secondary | ICD-10-CM

## 2018-08-01 MED ORDER — CARVEDILOL 3.125 MG PO TABS: 6.2500 mg | ORAL_TABLET | Freq: Two times a day (BID) | ORAL | 3 refills | Status: DC

## 2018-08-01 NOTE — Progress Notes (Signed)
Cardiology Office Note:    Date:  08/01/2018   ID:  Sue [name], DOB Apr 12, 1930, MRN [id number]  PCP:  Renford Dills, MD  Cardiologist:  Jodelle Red, MD PhD  Referring MD: Renford Dills, MD   CC: establish care/abnormal echocardiogram  History of Present Illness:    Sue [name] a 84 y.o. female with a hx of hypothyroidism, RBBB, recently diagnosed chronic systolic and diastolic heart failure who [name] seen as a new consult at the request of Renford Dills, MD for the evaluation and management of abnormal echocardiogram.  About 18 months ago, she noted onset of edema in bilateral lower extremities. Had never had a heart history, no history of MI, heart cath, or stress test. Edema improved with lasix and potassium.Had an echocardiogram, she wasn't sure what it showed but recommended referral to cardiology. Reviewed findings with her today, which are consistent with systolic and diastolic heart failure, likely chronic based on her symptoms. We discussed workup and management of her heart failure, which [name] reviewed below.  Has never had chest pain. No PND or orthopnea. No sycope. No palpitations. Sleeps on 1 pillow. Doesn't weigh herself routinely at home. Very thirsty. Exertion level decreased, can only do about half a room of vacuuming before she has to stop. Doesn't leave the house much because she [name] easily exhausted. Does not have a scale or blood pressure cuff currently, cannot afford to buy one out of pocket.  She notes that she has been very depressed. We spent significant time talking about her family stressors, life at home since her husband passed away; living with her younger son and his challenges, and overall not feeling joy in life. She has no thoughts of harming herself but thinks "it wouldn't be the worst thing if God took me at night." She has been on medications for depression and anxiety; she [name] on citalopram but notes it has been making her more nauseated.  She also notes that she takes clonazepam at night for sleep, but due to a medication issue with her pharmacy, she will not have more until January. Has seen a psychologist in the past but can no longer afford the copay. We talked about reaching out for help, offered community resources to her, which she appreciated.   Past Medical History:  Diagnosis Date  . Hyperlipidemia    Reported PMH/per notes: Hypercholesterolemia Hypothyroidism Osteoarthritis Anxiety and depression Chronic systolic and diastolic heart failure Right bundle branch block Fatigue  History reviewed. No pertinent surgical history.  Current Medications: Current Outpatient Medications on File Prior to Visit  Medication Sig  . cholecalciferol (VITAMIN D) 1000 units tablet Take 1,000 Units by mouth daily.  . citalopram (CELEXA) 40 MG tablet Take 40 mg by mouth daily.  . clonazePAM (KLONOPIN) 0.5 MG tablet Take 0.5 mg by mouth 2 (two) times daily as needed for anxiety.  . furosemide (LASIX) 20 MG tablet Take 20 mg by mouth daily.   Marland Kitchen ibuprofen (ADVIL,MOTRIN) 800 MG tablet Take 800 mg by mouth every 8 (eight) hours as needed.  Marland Kitchen levothyroxine (SYNTHROID, LEVOTHROID) 100 MCG tablet Take 100 mcg by mouth daily before breakfast.  . meloxicam (MOBIC) 15 MG tablet Take 15 mg by mouth daily.  . potassium chloride (K-DUR,KLOR-CON) 10 MEQ tablet Take 10 mEq by mouth 2 (two) times daily.   . simvastatin (ZOCOR) 20 MG tablet Take 20 mg by mouth daily.   No current facility-administered medications on file prior to visit.  Allergies:   Patient has no allergy information on record.   Social History   Socioeconomic History  . Marital status: Married    Spouse name: Not on file  . Number of children: Not on file  . Years of education: Not on file  . Highest education level: Not on file  Occupational History  . Not on file  Social Needs  . Financial resource strain: Not on file  . Food insecurity:    Worry: Not on  file    Inability: Not on file  . Transportation needs:    Medical: Not on file    Non-medical: Not on file  Tobacco Use  . Smoking status: Not on file  . Smokeless tobacco: Never Used  Substance and Sexual Activity  . Alcohol use: No  . Drug use: No  . Sexual activity: Never  Lifestyle  . Physical activity:    Days per week: Not on file    Minutes per session: Not on file  . Stress: Not on file  Relationships  . Social connections:    Talks on phone: Not on file    Gets together: Not on file    Attends religious service: Not on file    Active member of club or organization: Not on file    Attends meetings of clubs or organizations: Not on file    Relationship status: Not on file  Other Topics Concern  . Not on file  Social History Narrative  . Not on file     Family History: The patient's mother passed away at age 54, father passed away age 79, unclear cause. Son passed away as a result of injuries from a car accident. Three living children.  ROS:   Please see the history of present illness.  Additional pertinent ROS:  Constitutional: Negative for chills, fever, night sweats, unintentional weight loss. Positive for fatigue HENT: Negative for ear pain and hearing loss.  Positive for nasal drip/congestion Eyes: Negative for loss of vision and eye pain.  Respiratory: Positive for dyspnea on exertion, cough with clear to yellow sputum. Negative for wheezing.   Cardiovascular: Positive for LE edema. Negative for chest pain, palpitations, PND, orthopnea, and claudication.  Gastrointestinal: Negative for abdominal pain, melena, and hematochezia.  Genitourinary: Negative for dysuria and hematuria.  Musculoskeletal: Negative for falls and myalgias.  Skin: Negative for itching and rash.  Neurological: Negative for focal weakness, focal sensory changes and loss of consciousness.  Endo/Heme/Allergies: Does not bruise/bleed easily.   EKGs/Labs/Other Studies Reviewed:    The  following studies were reviewed today: Echo 06/30/18 Study Conclusions - Left ventricle: The cavity size was normal. Wall thickness was   increased in a pattern of mild LVH. Systolic function was mildly   to moderately reduced. The estimated ejection fraction was in the   range of 40% to 45%. Incoordinate septal motion and anterolateral   hypokinesis. Doppler parameters are consistent with abnormal left   ventricular relaxation (grade 1 diastolic dysfunction). The E/e&'   ratio [name] between 8-15, suggesting indeterminate LV filling   pressure. - Mitral valve: Mildly thickened leaflets . There was trivial   regurgitation. - Left atrium: The atrium was normal in size. - Pericardium, extracardiac: A trivial pericardial effusion was   identified posterior to the heart.  Impressions: - LVEF 40-45%, mild LVH, incoordinate septal motion and   anterolateral hypokinesis, grade 1 DD, indeterminate LV filling   pressure, normal LA size, trivial posterior pericardial effusion.  EKG:  EKG [name]  personally reviewed.  The ekg ordered today demonstrates normal sinus rhythm with RBBB  Recent Labs: No results found for requested labs within last 8760 hours.  Recent Lipid Panel No results found for: CHOL, TRIG, HDL, CHOLHDL, VLDL, LDLCALC, LDLDIRECT  Physical Exam:    VS:  BP 132/80   Pulse 81   Ht 5\' 4"  (1.626 m)   Wt 206 lb (93.4 kg)   SpO2 94%   BMI 35.36 kg/m     Wt Readings from Last 3 Encounters:  08/01/18 206 lb (93.4 kg)     GEN: Well nourished, well developed in no acute distress HEENT: Normal NECK: No JVD; No carotid bruits LYMPHATICS: No lymphadenopathy CARDIAC: regular rhythm, normal S1 and S2, no murmurs, rubs, gallops. Radial and DP pulses 2+ bilaterally. RESPIRATORY:  Clear to auscultation without rales, wheezing or rhonchi  ABDOMEN: Soft, non-tender, non-distended MUSCULOSKELETAL:  Trace bilateral LE edema; No deformity  SKIN: Warm and dry NEUROLOGIC:  Alert and  oriented x 3 PSYCHIATRIC:  Normal affect   ASSESSMENT:    1. Bilateral leg edema   2. Chronic combined systolic and diastolic heart failure (HCC)   3. Encounter for education about heart failure   4. Class 2 severe obesity due to excess calories with serious comorbidity and body mass index (BMI) of 35.0 to 35.9 in adult (HCC)   5. Medication management   6. Major depressive disorder with current active episode, unspecified depression episode severity, unspecified whether recurrent    PLAN:    1. Chronic systolic and diastolic heart failure, with major symptom of LE edema: -echo as above shows EF 40-45%, with anterolateral hypokinesis and grade 1 diastolic dysfunction -counseled on how we usually work up heart failure to determine if ischemic or nonischemic. Discussed options of chemical nuclear MPI, coronary CTA, or heart cath. She does not want an invasive procedure. We initially planned for lexiscan nuclear MPI, but on further consideration she does not think she would want a heart cath, so she would rather treat medically and not pursue any additional testing at this time -appears euvolemic today. Given instructions on heart failure education -contacted her insurance to see if she qualifies for a scale/BP cuff for heart failure management. -discussed medical management of heart failure. Has not been on any beta blocker or ACEI/ARB. Would like to start on one medication at a time. Reviewed pharmacy coverage for beta blockers, carvedilol [name] best option. Will start at low dose to make sure she tolerates. At follow up, would check renal panel (on KPN today, recent Cr was 1.16) and start ACEI or ARB. She [name] nervous about the cost of entresto, so will start with ACEI/ARB to see if she tolerates.  2. Depression: has a history of major depression, appears to be active at this time. No suicidal ideation or plans. Offered a variety of options for assistance. She [name] [employer] and [name] amenable to  [facility] for counseling options. Also referred her back to PCP to discuss medication options given her nausea on citalopram.  3. Health counseling and prevention -recommend heart healthy/Mediterranean diet, with whole grains, fruits, vegetable, fish, lean meats, nuts, and olive oil. Limit salt. -recommend moderate walking, 3-5 times/week for 30-50 minutes each session. Aim for at least 150 minutes.week. Goal should be pace of 3 miles/hours, or walking 1.5 miles in 30 minutes -recommend avoidance of tobacco products. Avoid excess alcohol. -Additional risk factor control:  -Diabetes: A1c [name] 6.7, technically diabetic by cutoff. Given her age, will aim  for lifestyle management. Would consider SGLT2i in the future given her heart failure  -Lipids: LDL 89, HDL 37, Tchol 162, TG 182. On simvastatin. Given age and that she has tolerated this, will not intensify at this time.  -Blood pressure control: BP 132/80, expect lowering effect with initiation of carvedilol.  -Weight: BMI 35, consistent with class 2 severe obesity. Very limited in her activity currently. Counseled on gradual improvements in her lifestyle as above  Plan for follow up: about 4-6 weeks with PharmD clinic to follow up on carvedilol/start ACEI/ARB, then about 3 mos with me for further adjustments.  TIME SPENT WITH PATIENT: >45 minutes of direct patient care. More than 50% of that time was spent on coordination of care and counseling regarding heart failure causes, workup, treatment, education. Also discussed depression and resources.  Jodelle RedBridgette Joshaua Epple, MD, PhD [place]  [facility]   Medication Adjustments/Labs and Tests Ordered: Current medicines are reviewed at length with the patient today.  Concerns regarding medicines are outlined above.  Orders Placed This Encounter  Procedures  . Basic metabolic panel  . EKG 12-Lead   Meds ordered this encounter  Medications  . carvedilol (COREG) 3.125 MG  tablet    Sig: Take 2 tablets (6.25 mg total) by mouth 2 (two) times daily.    Dispense:  90 tablet    Refill:  3    Patient Instructions  Medication Instructions:  Start: Coreg 3.125 mg two times a day  If you need a refill on your cardiac medications before your next appointment, please call your pharmacy.   Lab work: Your physician recommends that you return for lab work in 1 month (BMP)  If you have labs (blood work) drawn today and your tests are completely normal, you will receive your results only by: Marland Kitchen. MyChart Message (if you have MyChart) OR . A paper copy in the mail If you have any lab test that [name] abnormal or we need to change your treatment, we will call you to review the results.  Testing/Procedures: None  Follow-Up: At [facility], you and your health needs are our priority.  As part of our continuing mission to provide you with exceptional heart care, we have created designated Provider Care Teams.  These Care Teams include your primary Cardiologist (physician) and Advanced Practice Providers (APPs -  Physician Assistants and Nurse Practitioners) who all work together to provide you with the care you need, when you need it. You will need a follow up appointment in 3 months.  Please call our office 2 months in advance to schedule this appointment.  You may see Dr. Cristal Deerhristopher or one of the following Advanced Practice Providers on your designated Care Team:   Theodore DemarkRhonda Barrett, PA-C . Joni ReiningKathryn Lawrence, DNP, ANP  Your physician recommends that you schedule a follow-up appointment in 1 month with Pharm D   Any Other Special Instructions Will Be Listed Below (If Applicable).   Do the following things EVERY DAY:  1) Weigh yourself EVERY morning after you go to the bathroom but before you eat or drink anything. Write this number down in a weight log/diary. If you gain 3 pounds overnight or 5 pounds in a week, call the office.  2) Take your medicines as prescribed. If  you have concerns about your medications, please call us before you stop taking them.   3) Eat low salt foods-Limit salt (sodium) to 2000 mg per day. This will help prevent your body from holding onto fluid. Read  food labels as many processed foods have a lot of sodium, especially canned goods and prepackaged meats. If you would like some assistance choosing low sodium foods, we would be happy to set you up with a nutritionist.  4) Stay as active as you can everyday. Staying active will give you more energy and make your muscles stronger. Start with 5 minutes at a time and work your way up to 30 minutes a day. Break up your activities--do some in the morning and some in the afternoon. Start with 3 days per week and work your way up to 5 days as you can.  If you have chest pain, feel short of breath, dizzy, or lightheaded, STOP. If you don't feel better after a short rest, call 911. If you do feel better, call the office to let us know you have symptoms with exercise.  5) Limit all fluids for the day to less than 2 liters (or 64 ounces). Fluid includes all drinks, coffee, juice, ice chips, soup, jello, and all other liquids.  [employer]:  [place] [address], [phone]     Signed, Jodelle Red, MD PhD 08/01/2018 3:27 PM    [facility]

## 2018-08-01 NOTE — Patient Instructions (Addendum)
Medication Instructions:  Start: Coreg 3.125 mg two times a day  If you need a refill on your cardiac medications before your next appointment, please call your pharmacy.   Lab work: Your physician recommends that you return for lab work in 1 month (BMP)  If you have labs (blood work) drawn today and your tests are completely normal, you will receive your results only by: Marland Kitchen. MyChart Message (if you have MyChart) OR . A paper copy in the mail If you have any lab test that is abnormal or we need to change your treatment, we will call you to review the results.  Testing/Procedures: None  Follow-Up: At [facility], you and your health needs are our priority.  As part of our continuing mission to provide you with exceptional heart care, we have created designated Provider Care Teams.  These Care Teams include your primary Cardiologist (physician) and Advanced Practice Providers (APPs -  Physician Assistants and Nurse Practitioners) who all work together to provide you with the care you need, when you need it. You will need a follow up appointment in 3 months.  Please call our office 2 months in advance to schedule this appointment.  You may see Dr. Cristal Deerhristopher or one of the following Advanced Practice Providers on your designated Care Team:   Theodore DemarkRhonda Barrett, PA-C . Joni ReiningKathryn Lawrence, DNP, ANP  Your physician recommends that you schedule a follow-up appointment in 1 month with Pharm D   Any Other Special Instructions Will Be Listed Below (If Applicable).   Do the following things EVERY DAY:  1) Weigh yourself EVERY morning after you go to the bathroom but before you eat or drink anything. Write this number down in a weight log/diary. If you gain 3 pounds overnight or 5 pounds in a week, call the office.  2) Take your medicines as prescribed. If you have concerns about your medications, please call us before you stop taking them.   3) Eat low salt foods-Limit salt (sodium) to 2000 mg per  day. This will help prevent your body from holding onto fluid. Read food labels as many processed foods have a lot of sodium, especially canned goods and prepackaged meats. If you would like some assistance choosing low sodium foods, we would be happy to set you up with a nutritionist.  4) Stay as active as you can everyday. Staying active will give you more energy and make your muscles stronger. Start with 5 minutes at a time and work your way up to 30 minutes a day. Break up your activities--do some in the morning and some in the afternoon. Start with 3 days per week and work your way up to 5 days as you can.  If you have chest pain, feel short of breath, dizzy, or lightheaded, STOP. If you don't feel better after a short rest, call 911. If you do feel better, call the office to let us know you have symptoms with exercise.  5) Limit all fluids for the day to less than 2 liters (or 64 ounces). Fluid includes all drinks, coffee, juice, ice chips, soup, jello, and all other liquids.  [employer]:  [place] [address], [address] [phone]

## 2018-08-09 ENCOUNTER — Telehealth (HOSPITAL_COMMUNITY): Payer: Self-pay | Admitting: Licensed Clinical Social Worker

## 2018-08-09 ENCOUNTER — Telehealth: Payer: Self-pay | Admitting: Cardiology

## 2018-08-09 NOTE — Telephone Encounter (Signed)
CSW referred by MD to assist patient with obtaining a BP cuff and a scale. CSW contacted patient who shared that she lives alone in an apartment and pays $1,099 rent with a monthly income of $1,500. She reports her son assists with driving and grocery shopping and she can drive herself during daylight hours if needed.  Patient mentioned that she has had some unfortunate events since her husbands passing 10 years ago with financial issues and her oldest son which led to foreclosure of her home. CSW offered assistance with BP cuff and scale and patient stated she was grateful "just to talk". CSW will follow up with patient tomorrow after exploring resources for above items. Lasandra BeechJackie Reuel Lamadrid, LCSW, CCSW-MCS [phone]

## 2018-08-09 NOTE — Telephone Encounter (Signed)
° °  Patient calling, states she was told someone would reach out to her regarding blood pressure machine and scale. Requesting call from nurse

## 2018-08-09 NOTE — Telephone Encounter (Signed)
Spoke with pt and informed that I have reached out to the [facility] social worker to see if she knew of any resources. Currently awaiting a call back. Pt verbalized understanding.

## 2018-08-09 NOTE — Telephone Encounter (Signed)
Spoke with pt and inform her that the social worker from Heart Failure clinic will be reaching out to her. Pt verbalized understanding.

## 2018-08-10 ENCOUNTER — Telehealth (HOSPITAL_COMMUNITY): Payer: Self-pay | Admitting: Licensed Clinical Social Worker

## 2018-08-10 NOTE — Telephone Encounter (Signed)
CSW called patient to inform that we were able to obtain a BP cuff and a scale for her. CSW will deliver both to patient later today. Patient very appreciative. CSW available if further issues should arise. Lasandra BeechJackie Shalise Rosado, LCSW, CCSW-MCS [phone]

## 2018-09-19 ENCOUNTER — Telehealth: Payer: Self-pay | Admitting: Cardiology

## 2018-09-19 NOTE — Telephone Encounter (Signed)
Called patient, gave number to [facility] for them to assist her in finding a PCP.  Patient verbalized understanding, had no questions.

## 2018-09-19 NOTE — Telephone Encounter (Signed)
New Message     Patient needs recommendations for an internest or general physician.

## 2018-09-29 DIAGNOSIS — F419 Anxiety disorder, unspecified: Secondary | ICD-10-CM | POA: Diagnosis not present

## 2018-09-29 DIAGNOSIS — E039 Hypothyroidism, unspecified: Secondary | ICD-10-CM | POA: Diagnosis not present

## 2018-09-29 DIAGNOSIS — F329 Major depressive disorder, single episode, unspecified: Secondary | ICD-10-CM | POA: Diagnosis not present

## 2018-09-29 DIAGNOSIS — I509 Heart failure, unspecified: Secondary | ICD-10-CM | POA: Diagnosis not present

## 2018-09-29 DIAGNOSIS — I10 Essential (primary) hypertension: Secondary | ICD-10-CM | POA: Diagnosis not present

## 2018-09-29 DIAGNOSIS — E1169 Type 2 diabetes mellitus with other specified complication: Secondary | ICD-10-CM | POA: Diagnosis not present

## 2018-11-01 ENCOUNTER — Ambulatory Visit: Payer: PPO | Admitting: Cardiology

## 2018-11-02 ENCOUNTER — Encounter: Payer: Self-pay | Admitting: *Deleted

## 2018-11-09 ENCOUNTER — Other Ambulatory Visit: Payer: Self-pay | Admitting: *Deleted

## 2018-11-09 NOTE — Patient Outreach (Addendum)
[facility]) Care Management  11/09/2018  Sue [name] 1930/05/10 [id number]   Telephone Screen  Referral Date: 11/06/18 Referral Source: HTA referral routine Referral Reason: Resources to assist with premium- [facility] [phone] Insurance: HTA (Health team advantage)   Outreach attempt # 1 successful with the patient  Patient is able to verify HIPAA Reviewed and addressed referral to [facility] with patient  Sue [name] confirms contact with Health team advantage staff recently to discuss a premium She informs this Cm that she has "a  Zero premium but is charged ten dollars a month for penalty of not having hospitalizations." She states she "had to drop blue cross and blue shield" coverage but is now being penalized and she can not afford to pay a charge monthly  CM assisted Sue [name] in a conference call to HTA [phone]. She and Cm spoke with HTA staff, Morrie Sheldon. Sue [name] confirmed with Morrie Sheldon during the HIPAA verification that Dr Renford Dills is her primary MD. CM explained to Morrie Sheldon that a referral was received to assist with resources for patient premium. Sue [name] explained to [place] that she had Blue cross and blue shield and had to drop coverage. Morrie Sheldon reports she is able to see the referral to [facility]. Morrie Sheldon explains to Sue [name] that there is a late enrollment fee of  $10 and will send Sue [name] forms to complete to submit to [employer] who is charging the late enrollment penalty. Sue [name] inquired about how long the penalty will [name] and was informed "lifetime" Sue [name] again states she is on a fixed income and can not afford a penalty for "lifetime". Sue [name] informs Morrie Sheldon she had drug coverage via Blue cross and blue shield. Morrie Sheldon recommended she indicate this on the forms being sent to her along with Ashley's business card.   Cm inquired of Sue [name] if she understood and was able to ask all the questions needed to complete the process  recommended. Sue [name] confirmed "as of right now yes" The conference call was concluded  [facility] RN CM returned a call to Sue [name] to check in with her and to again assess her for any possible THN needs. She is able to verify HIPAA. She denies need of home medical management or education. She denies need of assistance to medical appointments. She denies need of assistance with advance directives. She states that she has lots of medications, She does speak of concerns with the cost of her medications but reports she is not able to go over her medication list with CM at the time of the call. She requests a return call from CM. CM offered CM number but she stated she did not the time to take the number today  Cm discussed making another attempt to speak with her to see if she needed possible other services and she agreed for contact before concluding the call.    THN RN CM consulted with THN SW, K Humble for other possible information to assist Sue [name]. Discussed at the time of the call pt did not share her financial status. SW recommends assessing Sue [name] during the next contact for possible need of medicaid assistance to help pay for medicare premium (?MQBE)  [facility] RN CM notes with review of Epic Sue [name] has been assisted by [facility] SW in 2018 and [facility] RN CM and pharmacy in 2019   Social: Sue [name] is widowed for 10 years and lives alone  in an apartment. She has a son who assists her with driving and grocery shopping. She can drive herself during the day time prn She reports she is independent with her ADLs. She reports being on a fixed income    Conditions: "pre diabetes" per pt/DM type 2, RBBB hx, hypothyroidism hx, Chronic systolic and diastolic HF, HTN, HDL, osteoarthritis of knee, ankle, vitamin D Deficiency, major depressive disorder, anxiety    DME BP and scales (HF clinic SW assisted her to obtain in December 2019)  Medications: She does speak of concerns with the cost of her medications but  reports she is not able to go over her medication list with CM at the time of the call    Appointments: [name] seen by Dr Nehemiah Settle on 08/01/2018    Advance Directives: Denies need for assist with advance directives    Consent: THN RN CM reviewed [facility] services with patient. Patient gave verbal consent for services.  Plan: [facility] RN CM will follow up with Sue [name] within the next 7 business days to assess if any further services needed from [facility] and plan for case closure if no needs identified [facility] RN CM sent a successful outreach letter as discussed with [facility] brochure enclosed for review   Ngozi Alvidrez L. Noelle Penner, RN, BSN, CCM [facility] Telephonic Care Management Care Coordinator Office number [phone] Mobile number [phone]  Main THN number [phone] Fax number [phone]

## 2018-11-10 ENCOUNTER — Ambulatory Visit: Payer: Self-pay | Admitting: *Deleted

## 2018-11-14 ENCOUNTER — Other Ambulatory Visit: Payer: Self-pay

## 2018-11-14 ENCOUNTER — Other Ambulatory Visit: Payer: Self-pay | Admitting: *Deleted

## 2018-11-14 NOTE — Patient Outreach (Addendum)
[facility]) Care Management  11/14/2018  Sue [name] 12-20-1929 [id number]   Telephone Screen  Referral Date: 11/06/18 Referral Source: HTA referral routine Referral Reason: Resources to assist with premium- Annie Main [facility] [phone] Insurance: HTA (Health team advantage)  [facility] RN CM returned a call to Mrs Louch to check in with her and to again assess her for any possible THN needs. She is able to verify HIPAA.  Outreach attempt # 2 successful with the patient  Patient is able to verify HIPAA Cm again discussed THNservices with patient and assessed her for her needs  She reports she has not received the forms from HTA that was discussed with her on 11/10/18 by Morrie Sheldon related to the fee for late enrollment from blue cross to HTA Please refer to CM note form 11/09/18   Cm discussed the consult with [facility] SW  for possible need of medicaid assistance to help pay for medicare premium (?MQBE) Mrs Pickron is interested in a SW referral for this and a medical alert system resource She also reports that on 11/14/18 she is almost out of money and has financial concerns   During the assessment she also voices interest in a Shower chair . Cm spoke with her about an order from Dr Nehemiah Settle to be sent to a DME provider that would call her about details and delivery/pick up of a shower chair    She denies need of home medical management or education. She denies need of assistance to medical appointments. She denies need of assistance with advance directives.   She states that she has lots of medications and is scheduled to meet with a health advisor at Dr Nehemiah Settle office but would be interested in a [facility] pharmacy referral to review her medicines. "I thing I am on too many and they are getting expensive"    [facility] RN CM notes with review of Epic Mrs Trautwein has  been assisted by [facility] SW in 2018 and [facility] RN CM and pharmacy in 2019   Social: Mrs Thunberg is widowed for 10 years and lives  alone in an apartment. She has a son who assists her with driving and grocery shopping. She can drive herself during the day time prn She reports she is independent with her ADLs. She reports being on a fixed income  On 3/10//20 She discussed her daughter support also   Conditions: "pre diabetes" per pt/DM type 2, RBBB hx, hypothyroidism hx, Chronic systolic and diastolic HF, HTN, HDL, osteoarthritis of knee, ankle, vitamin D Deficiency, major depressive disorder, anxiety    DME BP and scales (HF clinic SW assisted her to obtain in December 2019)  Medications: She does speak of concerns with the cost of her medications but reports she is not able to go over her medication list with CM at the time of the call    Appointments: last seen by Dr Nehemiah Settle on 08/01/2018    Advance Directives: Denies need for assist with advance directives    Consent: THN RN CM reviewed [facility] services with patient. Patient gave verbal consent for services.  Plan:  [facility] RN CM will refer her to [facility] SW for assistance with medical alert resource, financial assistance and possible medicaid assistance to help pay for medicare premium (?MQBE) Mrs Giordani is interested in a SW referral for this and a medical alert  [facility] RN CM will refer her to [facility] pharmacy for assistance with medication management and voiced concern with polypharmacy and cost  of medications  THN RN CM referred her to the successful outreach letter as discussed with [facility] brochure enclosed for review Cm encouraged a call to CM prn    Plez Belton L. Noelle Penner, RN, BSN, CCM [facility] Telephonic Care Management Care Coordinator Office number [phone] Mobile number [phone]  Main THN number [phone] Fax number [phone]

## 2018-11-15 ENCOUNTER — Other Ambulatory Visit: Payer: Self-pay | Admitting: *Deleted

## 2018-11-15 NOTE — Patient Outreach (Signed)
[facility]) Care Management  11/15/2018  Sue [name] April 10, 1930 [id number]   Care coordination DME call to primary care MD office Dr Nehemiah Settle    [facility] RN CM left a message for Darlene clinical medical assistant of Dr Nehemiah Settle related to patient request for assistance with a shower chair or tub bench via [facility]  CM left CM & Pt contact information for a return call prn      Shabreka Coulon L. Noelle Penner, RN, BSN, CCM [facility] Telephonic Care Management Care Coordinator Office number [phone] Mobile number [phone]  Main THN number [phone] Fax number [phone]

## 2018-11-17 ENCOUNTER — Other Ambulatory Visit: Payer: Self-pay

## 2018-11-17 NOTE — Patient Outreach (Signed)
[facility]) Care Management  11/17/2018  IZETTA BARRANCO 1929-10-13 [id number]   Successful outreach to patient regarding social work referral for medical alert resources, financial assistance, and possible Medicaid assistance. BSW encouraged patient to contact insurance provider to inquire about whether or not medical alert system is covered.  BSW offered to send patient a list of systems that are in the $20/month range.  Patient declined stating she cannot afford this. BSW inquired about whether or not patient has ever applied for Medicaid.  She was not familiar with this coverage so BSW educated her about this and the application process.  Patient reported that she did not want to apply at this time but agreed to [facility] mailing application for possible use in the future.  BSW also agreed to send a list of other financial resources. BSW will follow up within the next two weeks to ensure receipt of resources.   Malachy Chamber, BSW Social Worker [phone]

## 2018-11-20 ENCOUNTER — Other Ambulatory Visit: Payer: Self-pay | Admitting: Pharmacist

## 2018-11-20 NOTE — Patient Outreach (Signed)
[facility]) Care Management  11/20/2018  EREN RICCIUTI [birth year] [id number]   Since patient's PCP is Dr. Nehemiah Settle and his office has an agreement for pharmacy services with [facility], patient was referred to the [facility] Pharmacist assigned to Dr. Thea Gist, PharmD. (Per pharmacy workflow)  Georgie Chard contact info:  Dorisann Frames  [phone]  [email or web address]    Plan: [facility] case will be closed. Note will be sent to the referring [facility] staff.  Beecher Mcardle, PharmD, BCACP [facility] Clinical Pharmacist [phone]

## 2018-11-24 DIAGNOSIS — I509 Heart failure, unspecified: Secondary | ICD-10-CM | POA: Diagnosis not present

## 2018-11-29 ENCOUNTER — Other Ambulatory Visit: Payer: Self-pay

## 2018-11-29 NOTE — Patient Outreach (Signed)
[facility]) Care Management  11/29/2018  Sue [name] 22-Jun-1930 [id number]   Outreach to patient who confirmed receipt of Medicaid application mailed on 11/17/18.  Patient denied having any questions or needing further assistance at this time.  BSW closing case.  Malachy Chamber, BSW Social Worker [phone]

## 2018-12-05 DIAGNOSIS — Z1389 Encounter for screening for other disorder: Secondary | ICD-10-CM | POA: Diagnosis not present

## 2018-12-05 DIAGNOSIS — E039 Hypothyroidism, unspecified: Secondary | ICD-10-CM | POA: Diagnosis not present

## 2018-12-05 DIAGNOSIS — I509 Heart failure, unspecified: Secondary | ICD-10-CM | POA: Diagnosis not present

## 2018-12-05 DIAGNOSIS — I10 Essential (primary) hypertension: Secondary | ICD-10-CM | POA: Diagnosis not present

## 2018-12-05 DIAGNOSIS — E1169 Type 2 diabetes mellitus with other specified complication: Secondary | ICD-10-CM | POA: Diagnosis not present

## 2018-12-05 DIAGNOSIS — Z0000 Encounter for general adult medical examination without abnormal findings: Secondary | ICD-10-CM | POA: Diagnosis not present

## 2018-12-05 DIAGNOSIS — F419 Anxiety disorder, unspecified: Secondary | ICD-10-CM | POA: Diagnosis not present

## 2018-12-13 ENCOUNTER — Other Ambulatory Visit: Payer: Self-pay | Admitting: Cardiology

## 2018-12-25 DIAGNOSIS — F322 Major depressive disorder, single episode, severe without psychotic features: Secondary | ICD-10-CM | POA: Diagnosis not present

## 2018-12-25 DIAGNOSIS — E785 Hyperlipidemia, unspecified: Secondary | ICD-10-CM | POA: Diagnosis not present

## 2018-12-25 DIAGNOSIS — I10 Essential (primary) hypertension: Secondary | ICD-10-CM | POA: Diagnosis not present

## 2018-12-25 DIAGNOSIS — E039 Hypothyroidism, unspecified: Secondary | ICD-10-CM | POA: Diagnosis not present

## 2018-12-25 DIAGNOSIS — M179 Osteoarthritis of knee, unspecified: Secondary | ICD-10-CM | POA: Diagnosis not present

## 2018-12-25 DIAGNOSIS — M810 Age-related osteoporosis without current pathological fracture: Secondary | ICD-10-CM | POA: Diagnosis not present

## 2019-01-23 DIAGNOSIS — R0789 Other chest pain: Secondary | ICD-10-CM | POA: Diagnosis not present

## 2019-01-23 DIAGNOSIS — E1169 Type 2 diabetes mellitus with other specified complication: Secondary | ICD-10-CM | POA: Diagnosis not present

## 2019-01-23 DIAGNOSIS — F419 Anxiety disorder, unspecified: Secondary | ICD-10-CM | POA: Diagnosis not present

## 2019-01-23 DIAGNOSIS — E039 Hypothyroidism, unspecified: Secondary | ICD-10-CM | POA: Diagnosis not present

## 2019-01-23 DIAGNOSIS — I509 Heart failure, unspecified: Secondary | ICD-10-CM | POA: Diagnosis not present

## 2019-01-23 DIAGNOSIS — I10 Essential (primary) hypertension: Secondary | ICD-10-CM | POA: Diagnosis not present

## 2019-07-13 DIAGNOSIS — M545 Low back pain: Secondary | ICD-10-CM | POA: Diagnosis not present

## 2019-07-13 DIAGNOSIS — I10 Essential (primary) hypertension: Secondary | ICD-10-CM | POA: Diagnosis not present

## 2019-07-13 DIAGNOSIS — F329 Major depressive disorder, single episode, unspecified: Secondary | ICD-10-CM | POA: Diagnosis not present

## 2019-07-13 DIAGNOSIS — E1169 Type 2 diabetes mellitus with other specified complication: Secondary | ICD-10-CM | POA: Diagnosis not present

## 2019-07-13 DIAGNOSIS — Z23 Encounter for immunization: Secondary | ICD-10-CM | POA: Diagnosis not present

## 2019-07-13 DIAGNOSIS — E7800 Pure hypercholesterolemia, unspecified: Secondary | ICD-10-CM | POA: Diagnosis not present

## 2019-07-13 DIAGNOSIS — R197 Diarrhea, unspecified: Secondary | ICD-10-CM | POA: Diagnosis not present

## 2019-07-13 DIAGNOSIS — F419 Anxiety disorder, unspecified: Secondary | ICD-10-CM | POA: Diagnosis not present

## 2019-07-13 DIAGNOSIS — E039 Hypothyroidism, unspecified: Secondary | ICD-10-CM | POA: Diagnosis not present

## 2019-09-24 DIAGNOSIS — M1712 Unilateral primary osteoarthritis, left knee: Secondary | ICD-10-CM | POA: Diagnosis not present

## 2019-11-06 ENCOUNTER — Other Ambulatory Visit: Payer: Self-pay

## 2019-11-06 NOTE — Patient Outreach (Addendum)
[name]) Care Management Chronic Special Needs Program  11/06/2019  Name: Sue [name] DOB: 04/02/1930  MRN: [id number]  Sue [name] is enrolled in a chronic special needs plan for Diabetes. Chronic Care Management Coordinator telephoned client to review health risk assessment and to develop individualized care plan.  HIPAA verified.   Introduced the chronic care management program, importance of client participation, and taking their care plan to all provider appointments and inpatient facilities.    Subjective: Client states she is having difficulty affording food. She reports having a "mix up with her EBT."  Client states she is not able to afford copays for specialist visits and having difficulty overall managing her bills.   Client states, "I'm just not sure what the story is about my diabetes."  Client states she does not recall ever being diagnosed with prediabetes originally. She states her current diagnosis of being diabetic came approximately 1 1/2 year ago. Client denies checking her blood sugars or having a blood glucometer. Client states she is not taking the diabetes medication metformin because it caused her problems. Client denies having had any diabetic education.  Client reports her most recent A1c was 6.6.  She reports her last follow up visit with her primary care provider was 07/13/19. Client states her son takes her to her appointments.  Client states she has not been to the eye doctor in a few years. Client states she does not have a follow up appointment that she knows of scheduled with her primary MD.  RNCM offered to contact client's primary MD for update on plan of care, referral for diabetic education, and glucometer prescription. Client agreeable.  Advised client to contact her health team advantage concierge to determine glucometer coverage.   Client states she is not taking all of her medications consistently because she is not sure if they are  really helping or how many of them are really needed. Client states she has bins filled with medications that have been prescribed and she becomes disheartened at times due to this. Client states she has difficulty affording her medications and picking them up from the pharmacy.  She states her son assists her when he can.  RNCM discussed and offered referral to [facility] care management pharmacist. Client verbally agreed.   Client states she has ongoing swelling in her left leg.  She states she has discussed this with her doctor.  She reports the fluid pills that were prescribed do not seem to help. Client states she has compression stockings but is not wearing them at this time because they are difficult to put on and take off and they also cause the swelling to go into her thigh if and when she wears them.  RN case manager advised client to contact her primary care provider to discuss her ongoing symptoms. Client reports she saw the orthopedic doctor and had cortisone injections to her left knee.  She reports this did not help. Client states she uses a cane or walker for ambulation. She denies any recent falls.  Client states she is not able to get out of her home much. She reports her husband passed away 11 years ago. She reports having strained family dynamics that saddens her . Client states she saw Dr. Redmond Pulling a few years ago for depression but is no longer seeing anyone at this time.    Client states her last follow up visit with her cardiologist was 07/21/19. Client denies weighing herself consistently.   Client  states she would like assistance with an Advanced directive. She states she thinks she completed a DNR order with the primary providers office. She states she wants to make sure this is still in effect.    Goals Addressed            This Visit's Progress   .  Acknowledge receipt of Building control surveyor will mail client Advanced Directive packet.  Call RN case  manager for social work referral if assistance is needed with Advanced Directive.      . Client understands the importance of follow-up with providers by attending scheduled visits       Per chart review, most recent follow up with primary care provider 07/13/19, most recent follow up with cardiologist 07/21/19 RN Case manager discussed importance of ongoing follow up visits with provider.  Continue to keep follow up appointments with your providers.       . Client verbalize knowledge of Heart Failure disease self management skills within 6 months       RNCM will send client education articles:   Heart failure: understanding water pills and  thirst, Heart failure: Keeping tract of your weight each day, Low Salt Diet, Heart failure: When to call your doctor, and What is heart failure, Heart healthy diet RN case manager discussed signs/ symptoms of heart failure. Contact your doctor for mild to moderate symptoms.  Contact 911 for severe symptoms.  Weigh daily and record weights Adhere to low salt diet     . Client will report abillity to obtain Medications within 90 days       RN case manager will refer client to [facility] care management pharmacist for medication assistance and education.     . Client will report having follow up with [facility] social worker within 3 months.       RN case manager will refer client to social worker to assist with Advanced Directive/ DNR order, community resources for food and Medicaid application, and depression symptoms.    . client will verbalize reporting ongoing leg swelling symptoms to primary care provide within 3 months.       RN case manager advised client to notify her doctor of ongoing leg symptoms.     . Diabetes Patient stated goal: "I want to understand diabetes" (pt-stated)       RN case manager discussed A1c, blood sugar checks and using a glucometer. Discussed diabetes self management actions:  Glucose monitoring per provider recommendation  Check  feet daily  Visit provider every 3-6 months as directed  Hbg A1C level every 3-6 months.  Eye Exam yearly  Diabetes diet meal planning (RN case manager will mail client education article on Diabetic meal planning,   Taking diabetes medication as prescribed by provider  Physical activity   RN case manager will contact clients primary care provider to request diabetic education referral, order for glucometer, and provider diabetic treatment plan.     Marland Kitchen HEMOGLOBIN A1C < 7.0       Hgb A1c completed 07/13/19  Glucose monitoring per provider recommendation  Visit provider every 3-6 months as directed  Hbg A1C level every 3-6 months.  Carbohydrate controlled meal planning (RN case manager will send client education articles: Diabetic meal planning, Diabetes and diet.   Taking diabetes medication as prescribed by provider  Physical activity     . Maintain timely refills of diabetic medication as prescribed within the year .  Take medication as prescribed.  RN case manager will refer client to [facility] Care management pharmacist for medication assistance, education, management and polypharmacy.  Follow up  with your provider if you have questions regarding your medications    . Obtain annual  Lipid Profile, LDL-C       Lipid profile was completed 07/13/19 The goal for LDL (bad cholesterol) is less than 70 mg/dl as you are at high risk for complications try to avoid saturated fats, trans-fats, and eat more fiber.  RN case manager will send client education article: Heart healthy diet.     . Obtain Annual Eye (retinal)  Exam        Discussed importance of having a diabetic eye exam  Call and schedule eye exam Contact your health team advantage concierge for in network providers.     . Obtain Annual Foot Exam       Continue to check your feet daily and report any concerns to your doctor,.  Follow up with your doctor for  your annual physical and follow up appointment as recommended.      . Obtain annual screen for micro albuminuria (urine) , nephropathy (kidney problems)       Discussed with client importance of yearly physicals and follow up with primary care provider Schedule your yearly physical and lab checks as recommended by your provider.     Clydene Pugh Hemoglobin A1C at least 2 times per year       Hgb A1c completed 07/13/19 Discussed with client importance of consistent follow up with provider for exams and lab work    . Visit Primary Care Provider or Endocrinologist at least 2 times per year        Assessment:  PHQ2 - 4, PHQ9 - 15.  Client will benefit from referral to social worker for depression symptoms, community resources for food, Medicaid application, and assistance with Advanced Directive DNR order.   Client will benefit from referral to pharmacy for medication assistance, education, management and polypharmacy.  Assessment: Client is meeting diabetes self-management goal of hemoglobin A1C of <7.0% with most recent reading of 6.6% on 07/13/19. Client is not checking blood sugars or taking diabetic medications.   Client has good understanding of:  COVID-19 cause, symptoms, precautions (social distancing, stay at home order, hand washing, wear face covering when unable to maintain or ensure 6 foot social distancing), and symptoms requiring provider notification.  Plan:  Send successful outreach letter with a copy of their individualized care plan, Send individual care plan to provider and Send educational material: Heart failure: understanding water pills and thirst, What is heart failure, Heart failure: keeping tract of your weight each day, Diabetes and diet, Diabetic meal planning, Heart failure: When to call your doctor or 911, Heart healthy diet. Heart failure: How to be salt smart.   Chronic care management coordination will outreach in:  1 month  Will refer client to:  Social Work and Pharmacy   [name] RN,BSN,CCM Chronic Care Management  Coordinator [facility] Care Management [phone]

## 2019-11-08 NOTE — Patient Outreach (Signed)
Patient was referred to [facility] Pharmacy department.

## 2019-11-09 ENCOUNTER — Other Ambulatory Visit: Payer: Self-pay | Admitting: Pharmacist

## 2019-11-09 ENCOUNTER — Other Ambulatory Visit: Payer: Self-pay

## 2019-11-09 ENCOUNTER — Other Ambulatory Visit: Payer: Self-pay | Admitting: *Deleted

## 2019-11-09 NOTE — Patient Outreach (Signed)
  [facility]) Care Management Chronic Special Needs Program    11/09/2019  Name: Sue [name], DOB: [birth year]  MRN: [id number]   [name] is enrolled in a chronic special needs plan for Diabetes. Received call from client. Client states she is unable to afford her clonazepam refill due for pick up today. She states she is not able to afford her medicines because her refills are to frequent.  RNCM informed client she has been referred to [facility] pharmacist for medication assistance. Client informed RNCM will follow up with [facility] pharmacy regarding clients medication concern. Client states [facility] uses [facility] on [address].  RNCM contacted [facility] pharmacy and spoke with Marchelle Folks. Marchelle Folks states client's clonazepam has been filled and is ready for pickup, cost $13.22.  Marchelle Folks states client is overdue for refills on her simvastatin, levothyroxine and wellbutrin.  RNCM sent message to Jill Alexanders with [facility] care management requesting assistance.  RNCM sent in basket message to pharmacist, Nunzio Cobbs explaining clients current medication concern.   PLAN: RNCM will follow up with client within 3 business day.   [name] RN,BSN,CCM Chronic Care Management Coordinator [facility] Care Management [phone]

## 2019-11-09 NOTE — Patient Outreach (Signed)
  [facility]) Care Management  11/09/2019  Sue [name] 1930/03/24 [id number]     CSW made initial contact with pt today. CSW introduced self and role.  CSW attempted to confirm pt's identity however pt was  not wanting to talk today. Pt was questioning about who and why I was calling and my response did not appease her.  When CSW attempted to confirm her identity by her stating her DOB and address she asked that I tell it to her and she would then confirm if it was right. CSW advised her it could not be confirmed that way, pt stated; "please call another day". CSW will attempt again next week and will advise [facility] team (RNCM assigned) also.   Reece Levy, MSW, LCSW Clinical Social Worker  [facility] [phone]

## 2019-11-09 NOTE — Patient Outreach (Signed)
[facility]) Care Management  11/09/2019  Sue [name] [birth year] [id number]   Patient was called regarding medication adherence and assistance. Unfortunately, she did not answer her phone. HIPAA compliant message was left on her voicemail.  Per the referral, patient was having difficulty affording and obtaining clonazepam due to early refills.   Since the medication is a controlled substance, the pharmacy and insurance are going to be hesitant about early refills. According to [facility], she picked up the clonazepam today and paid $13.22.  Since clonazepam is a generic, there is not a "patient assistance program" to help with the cost. She could access RX Outreach and get 90 tablets for $9.00 through their program.  Patient also appeared to have a medication adherence issue with her chronic medications. I will attempt to address adherence during our next phone call.  Plan: Call patient back in 5-7 business days. Send unsuccessful outreach letter.  Beecher Mcardle, PharmD, BCACP [facility] Clinical Pharmacist [phone]

## 2019-11-11 ENCOUNTER — Other Ambulatory Visit: Payer: Self-pay

## 2019-11-11 NOTE — Patient Outreach (Signed)
  [facility]) Care Management Chronic Special Needs Program    11/11/2019  Name: Sue [name], DOB: Feb 23, 1930  MRN: [id number]   [name] is enrolled in a chronic special needs plan for Diabetes. Voice mail message received from Sue [name] from Dr. Idelle [name] office. Sue [name] states Dr. Nehemiah [name] says there is no need for client to check blood sugars at this time and he will discuss her treatment plan with her further after her next A1c check and at her next follow up visit.  Follow up received from Sue [name], [facility] pharmacist.  Sue [name] states she attempted to contact patient on 11/09/19 without success. Sue [name] reports she contacted the [employer] 11/09/19 and was made aware that client picked up her clonazepam on that day and paid  $13.22 for the prescription per the pharmacy.  Sue [name] states she will follow up with client within 1 week.   Plan:  RNCM will follow up with client within 3 business days to inform her of Dr. Idelle [name] advisement and Sue [name] attempting to  call.   Sue Ina RN,BSN,CCM Chronic Care Management Coordinator [facility] Care Management [phone]   .

## 2019-11-13 ENCOUNTER — Ambulatory Visit: Payer: Self-pay

## 2019-11-14 ENCOUNTER — Other Ambulatory Visit: Payer: Self-pay | Admitting: *Deleted

## 2019-11-14 NOTE — Patient Outreach (Signed)
[facility]) Care Management  11/14/2019  Sue [name] 01/15/1930 [id number]   CSW was able to speak to pt and confirmed her identity.  Pt was more pleasant and accepting of this call and CSW advised her to the referral requests that were sent: to include depression, community resource needs, Adv Directive.  Pt admits to feeling depressed and "down sometimes". She has been prescribed Wellbutrin however she does not take it daily as prescribed.  "I take it when I feel I need it".  CSW advised pt of the importance of taking it as prescribed (as well as all meds) and stressed the necessity of it being taken daily/as prescribed in order to get the full benefit of it.  Pt states she has just had it refilled and will begin to take it daily as prescribed going forward.  Pt denies having any history or current mental health hospitalizations or suicidal ideations/attempts.  She does not want to pursue counseling at this time and states she is "not interested right now".  She enjoys reading, watching tv and doing things around the house "as I'm able".  She would like to have some in home assistance with housekeeping- CSW will link pt with support agency for her to consider and to reach out as she desires.  CSW also discussed Advance Directives; to which she states she already has completed.  She does not know where the documents are; "when you're 86 years old you can't keep up with all the papers".  CSW encouraged pt to look for them and/or to complete a new document so its readily available for when and if needed.  Pt was agreeable to CSW mailing her the Advance Directive packet for review and completion with a Notary. Pt also shared that she lives alone "for the most part" and has a son named Thermon Leyland who helps her with transportation, getting groceries and her medications.  She is on a limited income and does receive food stamps.  Pt may be eligible for Medicaid assistance and CSW will  investigate and mail her application and info on Medicaid services as well.    Pt shared with CSW that she has DM and states, " I am not on any medicine for my diabetes now".  She seems concerned and wondering if she needs to be on anything- CSW discussed with her that [facility] has a Teacher, early years/pre and a Nurse who have also been assigned to reach out to her and that they have tried to call her and will be calling again.  CSW encouraged pt to expect their calls this week.  CSW will share this conversation with [facility] team as well as PCP.   CSW will plan to outreach pt again next week. Encouraged pt to call if needs arise prior to follow up call.   Reece Levy, MSW, LCSW Clinical Social Worker  [facility] [phone]

## 2019-11-16 ENCOUNTER — Ambulatory Visit: Payer: Self-pay | Admitting: Pharmacist

## 2019-11-20 ENCOUNTER — Other Ambulatory Visit: Payer: Self-pay

## 2019-11-20 NOTE — Patient Outreach (Signed)
  [facility]) Care Management Chronic Special Needs Program    11/20/2019  Name: ENGLISH TOMER, DOB: [birth year]  MRN: [id number]   Ms. Rainn Zupko is enrolled in a chronic special needs plan for Diabetes. Return call to client. Unable to reach. HIPAA compliant voice message left with call back phone number.   PLAN; RNCM will attempt 2nd telephone outreach to client within 3 business days.   [name] RN,BSN,CCM Chronic Care Management Coordinator [facility] Care Management [phone]

## 2019-11-22 ENCOUNTER — Other Ambulatory Visit: Payer: Self-pay | Admitting: Pharmacist

## 2019-11-22 ENCOUNTER — Ambulatory Visit: Payer: Self-pay | Admitting: Pharmacist

## 2019-11-22 ENCOUNTER — Ambulatory Visit: Payer: Self-pay

## 2019-11-22 NOTE — Patient Outreach (Signed)
[facility]) Care Management  11/22/2019  Sue [name] [birth year] [id number]   Patient was referred to [facility] Pharmacist at her Provider's office.  Plan: Close patient's pharmacy case. [facility] will address her medication adherence issues.  Beecher Mcardle, PharmD, BCACP [facility] Clinical Pharmacist [phone]

## 2019-11-26 ENCOUNTER — Ambulatory Visit: Payer: Self-pay | Admitting: Pharmacist

## 2019-11-29 ENCOUNTER — Ambulatory Visit: Payer: Self-pay

## 2019-11-30 ENCOUNTER — Other Ambulatory Visit: Payer: Self-pay | Admitting: *Deleted

## 2019-11-30 ENCOUNTER — Other Ambulatory Visit: Payer: Self-pay

## 2019-11-30 NOTE — Patient Outreach (Signed)
[facility]) Care Management  11/30/2019  Sue [name] Feb 29, 1930 [id number]   CSW made contact with pt today for follow up. Pt continues to say, "I can't hear you" and "now where are calling from".  CSW having to yell to attempt to ask pt to turn her tv down so she can hear me...Marland KitchenMarland Kitchen  Pt fixated on certain things throughout the conversation; mostly around not hearing back from her Doctor about changing a medication because she is in pain".  "I have fibromyalgia".  CSW encouraged pt to call again if the allotted time as passed that she was told to expect a return call.   CSW has also offered to message PCP office regarding this request. Pt reports her depression is "better" and wants to hold off on more questions/screening assessment; " I have someone coming to see me Monday from [employer]". Pt is not certain who is coming but is encouraged to talk with them about any and all issues and concerns related to her healthcare (meds, complaints, needs, etc.) Pt is receiving an increase in her Food Stamps and denies any concerns related to RX, food, transportation,etc.  CSW suggested she make an appointment to see her PCP; however, she states she has "not felt well enough to go".  CSW validated her feelings yet stressed to her that if she's not feeling well that is the main reason she needs to go see her Doctor.  CSW encouraged pt to call and schedule appointment.  CSW will route this chart note to PCP as well as [facility] team and plan a follow up call next week.   Reece Levy, MSW, LCSW Clinical Social Worker  [facility] [phone]

## 2019-11-30 NOTE — Patient Outreach (Signed)
  [facility]) Care Management Chronic Special Needs Program    11/30/2019  Name: ZHANAE PROFFIT, DOB: [birth year]  MRN: [id number]   Ms. Ronnae Kaser is enrolled in a chronic special needs plan for Diabetes. Second return call attempt to client. Unable to reach. HIPAA compliant voice message left with call back phone number and return call request.   PLAN; RNCM will attempt 3rd telephone outreach to client within 1 week   [name] RN,BSN,CCM Chronic Care Management Coordinator [facility] Care Management [phone]

## 2019-12-04 ENCOUNTER — Ambulatory Visit: Payer: Self-pay | Admitting: *Deleted

## 2019-12-06 ENCOUNTER — Other Ambulatory Visit: Payer: Self-pay

## 2019-12-06 ENCOUNTER — Other Ambulatory Visit: Payer: Self-pay | Admitting: *Deleted

## 2019-12-06 NOTE — Patient Outreach (Addendum)
  [facility]) Care Management Chronic Special Needs Program    12/06/2019  Name: [name], DOB: [birth year]  MRN: [id number]   [name] is enrolled in a chronic special needs plan for Diabetes. Return call to client. HIPAA verified. Client states she continues to have problems with her left leg/ knee area and is unsure what is causing her issue.  She reports swelling in her knee and states she takes lasix and potassium pills that seem to help on occasion. She states her primary care provider is aware.  Client reports she saw her primary care provider on 12/04/19 with no change in treatment plan. Client states she saw an orthopedic doctor " a while back" and received a shot in her left knee.  She states the shot did not seem to help.  Per chart review, last orthopedic visit was 09/24/19 with Dr. Jerl Santos.  RNCM advised client to call Dr. Nolon Nations office and explain to them she continues to have problems with her knee and request a follow up visit. Client verbalized understanding.  Client states she had lab work done at her follow up visit with her primary care provider. She reports her A1c was 7.0.   PLAN: RNCM will follow up with client within 3 months as per tier level.  RNCM advised client to notify MD of any changes in condition prior to scheduled appointment. Client advised to contact RNCM as needed and contact her HTA concierge for benefit questions.  RNCM advised  client to utilize the 24 hour HTA nurse advise line number [phone] if needed.   [name] RN,BSN,CCM Chronic Care Management Coordinator [facility] Care Management [phone]

## 2019-12-06 NOTE — Patient Outreach (Signed)
[facility]) Care Management  12/06/2019  CHAUNA OSORIA Mar 17, 1930 [id number]   CSW made phone contact with pt today. Pt reports she is doing fine. She shared that she did hear back from her PCP office and they made some suggestions but she has not tried it yet (it being RX I believe she was explaining).  "They don't know if it may be arthritis or what".  She also shared with CSW she is having a difficult time affording her meds.  Discussed the pending stimulus check and temporary increase in EBT(food stamps) assistance which may offer additional funds to help out for now.  CSW will investigate the Pharmacy support pt is eligible for and ask for follow up with pt.   Pt reports not getting the packet of resources mailed to her as of yet. CSW will touch base again in 7-10 days for an update on things as well as material mailed.   Reece Levy, MSW, LCSW Clinical Social Worker  [facility] [phone]

## 2019-12-10 ENCOUNTER — Ambulatory Visit: Payer: Self-pay

## 2019-12-11 ENCOUNTER — Other Ambulatory Visit: Payer: Self-pay | Admitting: *Deleted

## 2019-12-11 NOTE — Patient Outreach (Signed)
[facility]) Care Management  12/11/2019  Sue [name] 1930/06/17 [id number]   CSW spoke with pt by phone today to advise her to contact her PCP office for any medication assistance needs/concerns.  CSW advised pt of phone call with the Pharmacist at PCP's office who has indicated plans to get their team (RN, SW) involved to assist.  CSW advised pt  that she should expect a call from their office in regards to potential support and assistance they can provide.   CSW will plan to touch base with pt in 7-10 days for updates on her progress with RX cost/concerns and for further needs and support.

## 2019-12-13 ENCOUNTER — Ambulatory Visit: Payer: HMO | Admitting: *Deleted

## 2019-12-20 ENCOUNTER — Other Ambulatory Visit: Payer: Self-pay | Admitting: *Deleted

## 2019-12-20 NOTE — Patient Outreach (Signed)
[facility]) Care Management  12/20/2019  Sue [name] 14-Nov-1929 [id number]   CSW made contact with pt today who denies any concerns at this time. Pt pleasantly states she would "prefer to not participate".  CSW reminded her the program is free and she politely declined.  CSW did contact PCP office to inquire with the CCM team. They have reached out to her and plan to follow up in about 3 weeks.  "the Nurse who does chronic care management gave her info on meals on wheels and other resources.  CSW will close case referral and advise PCP and [facility] team of above.  CSW encouraged pt to reach out if needs arise.   Reece Levy, MSW, LCSW Clinical Social Worker  [facility] [phone]

## 2019-12-26 ENCOUNTER — Other Ambulatory Visit (HOSPITAL_COMMUNITY): Payer: Self-pay | Admitting: Internal Medicine

## 2019-12-26 ENCOUNTER — Telehealth (HOSPITAL_COMMUNITY): Payer: Self-pay | Admitting: Internal Medicine

## 2019-12-26 DIAGNOSIS — I509 Heart failure, unspecified: Secondary | ICD-10-CM

## 2019-12-26 NOTE — Telephone Encounter (Signed)
I called patient to schedule an echocardiogram per an order from Dr. Nehemiah Settle.  Patient was very upset and declined to schedule the echocardiogram. She states she was unable to get thru to the PCP about a new med he started her on and she would not follow up for anything until she talked to them.

## 2020-01-23 ENCOUNTER — Other Ambulatory Visit: Payer: Self-pay

## 2020-01-23 NOTE — Patient Outreach (Signed)
  [facility]) Care Management Chronic Special Needs Program    01/23/2020  Name: Sue [name], DOB: [birth year]  MRN: [id number]   Sue [name] is enrolled in a chronic special needs plan for Diabetes.Telephone call to client for assessment follow up. Unable to reach. HIPAA compliant voice message left with call back phone number.  PLAN; RNCM will attempt 2nd telephone call to client within 1 week.   [name] RN,BSN,CCM Chronic Care Management Coordinator [facility] Care Management [phone]

## 2020-01-23 NOTE — Patient Outreach (Signed)
  [facility]) Care Management Chronic Special Needs Program   01/23/2020  Name: Sue [name], DOB: 09-09-1929  MRN: [id number]  The client was discussed in today's interdisciplinary care team meeting.  The following issues were discussed:  Client's needs, Changes in health status, Key risk triggers/risk stratification, Care Plan, Coordination of care and Issues/barriers to care   Livia Snellen, BSN, MS, CCM  [name], BSN, CCM   [facility], BSN, CCM, CDE  Kathyrn Sheriff, BSN, MSN, CCM  [name], RN, BSN   Iverson Alamin, CBCC/ CMAA  Dr. Charlott Rakes   Dr. Jacob Moores   Mosie Lukes, RD, LDN (HTA)  Tommye Standard, Pharm D (HTA)  Dessa Phi, RN ([facility])  Dr. Darrick Meigs ([facility])      Plan: [facility] will continue to follow as clients CSNP care management care coordinator  [name] RN,BSN,CCM Chronic Care Management Coordinator [facility] Care Management [phone]

## 2020-01-28 ENCOUNTER — Other Ambulatory Visit: Payer: Self-pay

## 2020-01-28 NOTE — Patient Outreach (Signed)
[facility]) Care Management Chronic Special Needs Program  01/28/2020  Name: Sue [name] DOB: 02-04-1930  MRN: [id number]  Ms. Artemisa Sladek is enrolled in a chronic special needs plan for Diabetes.Telephone call to client regarding assessment follow up. HIPAA verified.  Client states she is not getting the help she needs from her current primary care provider. Client states she has scheduled to have a second opinion with Dr. Sigmund Hazel on 02/08/20. She reports having transportation for this appointment.  Client reports having ongoing pain in her left hip and leg.  She states she thinks she has arthritis and needs to a rheumatologist.  Client states she is unsure if this would be covered.  She reports she does not get around much in her home only getting up if she has to.  She states she uses a walker for ambulation assistance. Client denies any falls. Client reports the meloxicam prescribed by her doctor does not work and she is afraid to take the ultram because she is concerned it may addictive.   Client states she does not take diabetic medication or check blood sugars because she has not been advised to by her doctor.   RNCM contacted Cristopher Estimable, [facility] pharmacist with primary MD practice. She states it has been difficult to get client in for follow up. She states she recently completed a medication review with client and discussed her concerns regarding the  meloxicam and ultram. She states client was also not taking her wellbutrin as prescribed and she advised her to take at least 1 wellbutrin per day. Pharmacist states client has been contacted by the [place] [facility] program to assist with finances, transportation and resources but client has declined. Pharmacist states she also offered to enroll client in the [facility] pharmacy program so that she can receive her medications by mail but client declined. Pharmacist states client was originally  scheduled for a pharmacy follow up in   August 2021 but she will attempt to contact client in 1 month to reoffer assistance. Individualized care plan reviewed and updated.   Goals Addressed            This Visit's Progress   . Client understands the importance of follow-up with providers by attending scheduled visits   On track    Most recent follow up with primary care provider 07/13/19 and 12/03/19 Continue to keep follow up appointments with your providers.       . Client verbalize knowledge of Heart Failure disease self management skills within 6 months   On track    Continue to take your medications as prescribed.  Continue to follow up with your doctor as recommended.  Refer to your [employer] calendar for heart failure action plan Continue  to Weigh daily and record weights Continue to Adhere to low salt diet RN case manager will send client education article: Heart failure: Working with your doctor.      . COMPLETED: Client will report abillity to obtain Medications within 90 days       Client reports having the medications that her doctor has prescribed.     . Client will report having contact with HTA concierge to discuss benefit coverage for referral to rheumatologist and/ or specialist within 3 weeks.   On track    RN case manager sent confidential email to HTA concierge requesting they call client regarding benefit coverage for specialist/ rheumatology visit.     . COMPLETED: Client will report having follow up with [facility]  Child psychotherapist within 3 months.       Client reports having follow up calls with social worker Per chart review client has had social worker follow up on 11/14/19, 11/30/19, 12/06/19, and 12/20/19.       Marland Kitchen Client will report keeping follow up appointment with Dr. Sigmund Hazel to discuss ongoing leg/ hip pain.   On track    RN case manager advised client to keep follow up appointment with doctor and discuss ongoing left leg/ hip pain RN case manager confirmed client has transportation for  appointment.     . COMPLETED: client will verbalize reporting ongoing leg swelling symptoms to primary care provide within 3 months.       Client reports having follow up appointment with primary MD on 12/03/19    . Diabetes Patient stated goal: "I want to understand diabetes" (pt-stated)   On track    RN rediscussed diabetes self management actions:  Glucose monitoring per provider recommendation  Visit provider every 3-6 months as directed  Hbg A1C level every 3-6 months.  Diabetes diet meal planning (RN case manager will mail client education article on Diabetic meal planning,   Taking diabetes medication as prescribed by provider  Physical activity  RN contacted clients primary care provider regarding diabetes treatment plan: Advisement: client does not need to check blood sugars at this time and diabetes treatment plan would be discussed at next follow up visit/ Hgb A1c.      Marland Kitchen HEMOGLOBIN A1C < 7.0       Hgb A1c completed 12/03/19 Continue diabetes self management  Glucose monitoring per provider recommendation  Visit provider every 3-6 months as directed  Hbg A1C level every 3-6 months.  Carbohydrate controlled meal planning (RN case manager will send client education articles: Diabetic meal planning, Diabetes and diet.   Taking diabetes medication as prescribed by provider  Physical activity     . COMPLETED: Maintain timely refills of diabetic medication as prescribed within the year .       Continue to Take medication as prescribed.  Client reports having follow up with primary care provider office pharmacist. Follow up  with your provider if you have questions regarding your medications Client reports she is not taking any diabetic medications at this time.          . Obtain annual  Lipid Profile, LDL-C   On track    Lipid profile was completed 12/03/19 Continue to eat a heart healthy diet   Continue to take your statin (Cholesterol) medication as prescribed.    RN case manager will send client education article: Low cholesterol, saturated fat, and trans fat diet.     . Obtain Annual Eye (retinal)  Exam    On track    Client advised to schedule appointment for eye exam.  Last eye exam not noted in chart.     . Obtain Annual Foot Exam   On track    Last foot exam date not noted in chart Continue diabetes foot care - Check feet daily at home (look for skin color changes, cuts, sores or cracks in the skin, swelling of feet or ankles, ingrown or fungal toenails, corn or calluses). Report these findings to your doctor - Wash feet with soap and water, dry feet well especially between toes - Moisturize your feet but not between the toes - Always wear shoes that protect your whole feet.      . Obtain annual screen for micro albuminuria (urine) ,  nephropathy (kidney problems)   On track    Obtain your annual screen for microalbuminuria - diabetes can affect your kidneys - it is important for your doctor to check your urine at least once per year. These tests show how your kidney's are working    . Obtain Hemoglobin A1C at least 2 times per year   On track    Hgb A1c completed 07/13/19 and 12/03/19 It is important to follow up with the doctor for your annual wellness visit every year    . Visit Primary Care Provider or Endocrinologist at least 2 times per year        Most recent primary care provider appointment: 07/13/19 AND 12/03/19       Plan:  Send successful outreach letter with a copy of their individualized care plan, Send individual care plan to provider and Send educational material  Chronic care management coordinator will outreach in:  3 Months  RNCM sent request to HTA concierge to contact client to discuss her benefit coverage concerns for specialist/ rheumatologist visit.    Quinn Plowman RN,BSN,CCM Chronic Care Management Coordinator [facility] [phone]    .

## 2020-01-30 ENCOUNTER — Ambulatory Visit: Payer: Self-pay

## 2020-02-12 ENCOUNTER — Other Ambulatory Visit: Payer: Self-pay

## 2020-02-12 NOTE — Patient Outreach (Signed)
  [facility]) Care Management Chronic Special Needs Program    02/12/2020  Name: Sue [name], DOB: 1930-03-14  MRN: [id number]   Sue [name] is enrolled in a chronic special needs plan for Diabetes. Telephone call to client for post follow up visit with Dr. Hyacinth Meeker. Client states she is waiting for lab work to come back. She denies any change in her current treatment plan or specialist referral. Client states she does not have any answers at this time. Client agreed to another follow up call with RNCM within 2 weeks.   PLAN:  RNCM will follow up with client within 2 weeks.   [name] RN,BSN,CCM Chronic Care Management Coordinator [facility] Management [phone]

## 2020-02-28 ENCOUNTER — Ambulatory Visit: Payer: Self-pay

## 2020-03-28 ENCOUNTER — Other Ambulatory Visit: Payer: Self-pay | Admitting: *Deleted

## 2020-03-28 NOTE — Patient Outreach (Signed)
  [facility]) Care Management Chronic Special Needs Program    03/28/2020  Name: Sue [name], DOB: 1930-08-03  MRN: [id number]   [name] is enrolled in a chronic special needs plan for Diabetes. (RN Futures trader covering for colleague/ RN care manager [name]), received referral from [facility] Utilization Management Department stating client needs assistance in the home or skilled nursing facility due to health is declining, per referral, client cannot care for herself at home and has no money to pay for services.  Client is also trying to find new primary care provider.  Referral also sent to [facility]. Social worker has previously worked with client.  Outreach call to client to address above mentioned issues, no answer to telephone.    PLAN Outreach client next week Provide update to primary care manager  [name] [facility], BSN [facility] RN Care Coordinator, CSNP [phone]

## 2020-04-07 ENCOUNTER — Other Ambulatory Visit: Payer: Self-pay

## 2020-04-07 NOTE — Patient Outreach (Signed)
[facility]) Care Management Chronic Special Needs Program    04/07/2020  Name: Sue [name], DOB: 1930/06/01  MRN: [id number]   [name] is enrolled in a chronic special needs plan for Diabetes. Telephone call to client for CSNP assessment follow up and for [facility] utilization management referral follow up.  Referral  from [facility] Utilization Management Department states client needs assistance in the home or skilled nursing facility due to health is declining, per referral, client cannot care for herself at home and has no money to pay for services.  Client is also trying to find new primary care provider.  Referral also sent to [facility].  HIPAA verified by client. Client states she needs assistance at home because her health is declining and it's becoming more difficult for her to care for herself.  Client states ,"I am not getting any answers from my doctors regarding a diagnosis."  Client reports she has swelling in her legs and has to use a walker for ambulation. She reports her doctor  has prescribed furosemide and potassium which helps at minimum. Client states she has seen  Dr. Hyacinth Meeker.    She states her sleeping medication was recently adjusted and now she is not sleeping well. Client states, "I don't feel I am getting the appropriate care. I really feel the care I am getting is sub par. I need to see someone who can give me answers as to what is really going on with me." RNCM offered to re-refer client to the social worker to assist with home care needs. Client verbalized agreement.  RNCM offered to have HTA concierge send an in plan primary care provider list if considering new primary care provider.  Client states she has a list. She states she is unsure if she is changing her provider at this time. She states, " I don't know what to do."  RNCM offered to contact clients primary care provider to discuss her concerns. Client verbalized agreement.  Client states she does not  feel good and she is tired because she did not sleep well due to her doctor adjusting her sleep medication. Client states she does not want to continue call at this time if I was unable to provider her with answers.   RNCM contacted clients primary care provider office and spoke with Amy regarding client's concerns. Amy requested RNCM leave message for primary care provider's nurse.  Voice message left with provider office regarding client concerns.  Return call requested.  RNCM spoke with [place] at  Dr. Gus Rankin office. Requested return call to discuss client's concerns and current plan of care.   Goals       Acknowledge receipt of Advanced Directive package      RN case manager will mail client Advanced Directive packet.  Call RN case manager for social work referral if assistance is needed with Advanced Directive.        Client understands the importance of follow-up with providers by attending scheduled visits      Most recent follow up with primary care provider 07/13/19 and 12/03/19 Continue to keep follow up appointments with your providers.         Client verbalize knowledge of Heart Failure disease self management skills within 6 months      Continue to take your medications as prescribed.  Continue to follow up with your doctor as recommended.  Refer to your [employer] calendar for heart failure action plan Continue  to Weigh daily  and record weights Continue to Adhere to low salt diet RN case manager will send client education article: Heart failure: Working with your doctor.        Client will express      Client will express improved sleep quality in 3 months.      RN case manager will send client education article: Tips for getting better sleep. Continue taking your prescribed medication as recommended by your doctor.  Maintain a consistent time for going to bed and waking up Adjust room temperature and ventilation Darken room light Read or listen to  calming music Warm bath Avoid heavy meals and limit fluid intake before bedtime.  Contact your doctor for questions.       Client will report having contact with HTA concierge to discuss benefit coverage for referral to rheumatologist and/ or specialist within 3 weeks.      RN case manager sent confidential email to HTA concierge requesting they call client regarding benefit coverage for specialist/ rheumatology visit.       Client will verbalize having contact from social worker to discuss personal / home care needs in 3 months.      RN case manager will refer client to social worker for home care needs/ resources.       Client will verbalize improvement in health care needs in 3 months      RN case manager will contact clients primary care provider to discuss current plan of care RN case manager will refer client to social worker RN case manager will assist client with care coordination as needed.  RN case manager will provider client opportunity  to express concerns and feelings regarding her health conditions/ needs.       Diabetes Patient stated goal: "I want to understand diabetes" (pt-stated)      RN rediscussed diabetes self management actions:  Glucose monitoring per provider recommendation  Visit provider every 3-6 months as directed  Hbg A1C level every 3-6 months.  Diabetes diet meal planning (RN case manager will mail client education article on Diabetic meal planning,   Taking diabetes medication as prescribed by provider  Physical activity  RN contacted clients primary care provider regarding diabetes treatment plan: Advisement: client does not need to check blood sugars at this time and diabetes treatment plan would be discussed at next follow up visit/ Hgb A1c.        HEMOGLOBIN A1C < 7.0      Hgb A1c completed 12/03/19 Continue diabetes self management  Glucose monitoring per provider recommendation  Visit provider every 3-6 months as directed  Hbg A1C  level every 3-6 months.  Carbohydrate controlled meal planning (RN case manager will send client education articles: Diabetic meal planning, Diabetes and diet.   Taking diabetes medication as prescribed by provider  Physical activity       Obtain annual  Lipid Profile, LDL-C      Lipid profile was completed 12/03/19 Continue to eat a heart healthy diet   Continue to take your statin (Cholesterol) medication as prescribed.  RN case manager will send client education article: Low cholesterol, saturated fat, and trans fat diet.       Obtain Annual Eye (retinal)  Exam       Client advised to schedule appointment for eye exam.  Last eye exam not noted in chart.       Obtain Annual Foot Exam      Last foot exam date not noted in chart Continue diabetes foot  care - Check feet daily at home (look for skin color changes, cuts, sores or cracks in the skin, swelling of feet or ankles, ingrown or fungal toenails, corn or calluses). Report these findings to your doctor - Wash feet with soap and water, dry feet well especially between toes - Moisturize your feet but not between the toes - Always wear shoes that protect your whole feet.        Obtain annual screen for micro albuminuria (urine) , nephropathy (kidney problems)      Obtain your annual screen for microalbuminuria - diabetes can affect your kidneys - it is important for your doctor to check your urine at least once per year. These tests show how your kidney's are working      Obtain Hemoglobin A1C at least 2 times per year      Hgb A1c completed 07/13/19 and 12/03/19 It is important to follow up with the doctor for your annual wellness visit every year      Visit Primary Care Provider or Endocrinologist at least 2 times per year       Most recent primary care provider appointment: 07/13/19 AND 12/03/19        PLAN; RNCM will refer client to social worker.   RNCM will follow up with client in 1 week.  [name]  RN,BSN,CCM Chronic Care Management Coordinator [facility] [phone]

## 2020-04-10 ENCOUNTER — Ambulatory Visit: Payer: Self-pay

## 2020-04-11 ENCOUNTER — Other Ambulatory Visit: Payer: Self-pay

## 2020-04-11 NOTE — Patient Outreach (Signed)
  [name]) Care Management Chronic Special Needs Program    04/11/2020  Name: Sue [name], DOB: 1930-05-12  MRN: [id number]   [name] is enrolled in a chronic special needs plan for Diabetes. Voice mail message  Received from [place] with Dr. Rondel Baton office.   RNCM returned call and discussed plan of care. Sue [name] states cardiac and ortho referrals have been offered to client previously  regarding her leg swelling and client declined. She states she will discuss current plan of care with Dr. Hyacinth Meeker and return call to Sue [name].    PLAN; Will await follow up call from [facility] with Dr. Rondel Baton office.   Sue Ina RN,BSN,CCM Chronic Care Management Coordinator [facility] [phone]

## 2020-04-11 NOTE — Patient Outreach (Signed)
  [facility]) Care Management Chronic Special Needs Program    04/11/2020  Name: Sue [name], DOB: 14-Jun-1930  MRN: [id number]   [name] is enrolled in a chronic special needs plan for Diabetes. Return call received from Noreene Larsson, nurse with Dr. Wynonia Lawman office.  Nurse states client has declined Dr. Rondel Baton offer to do an echocardiogram and xray of spine due to leg swelling.  She reports client has declined most recommendations.  Nurse states client has  follow up with the in office pharmacist next week. Nurse states Dr. Hyacinth Meeker unsure how to proceed at this point due to client denying recommended treatment / testing.    [name] RN,BSN,CCM Chronic Care Management Coordinator [facility] Management [phone]

## 2020-04-14 ENCOUNTER — Other Ambulatory Visit: Payer: Self-pay

## 2020-04-14 DIAGNOSIS — E119 Type 2 diabetes mellitus without complications: Secondary | ICD-10-CM | POA: Insufficient documentation

## 2020-04-14 NOTE — Patient Outreach (Signed)
[facility]) Care Management Chronic Special Needs Program  04/14/2020  Name: Sue [name] DOB: 31-Oct-1929  MRN: [id number]  [name] is enrolled in a chronic special needs plan for Diabetes. Telephone call to client for CSNP assessment follow up. HIPAA verified. Client states she continues to experience discomfort in her legs and knees with swelling in her lower legs. Client states she experiences pain when walking and states her feet feel full from swelling. Client denies any shortness of breath.   She states, "No one has been able to give me an answer to what is going on with my legs."  RNCM advised client to consider a follow up visit with her cardiologist and orthopedic doctor. RNCM discussed signs/ symptoms of heart failure and home management. RNCM discussed with client importance of follow up with her cardiologist. Offered to contact cardiologist to arrange follow up appointment. Client verbally agreed. Client reports she has not been out of her house in over a year except for doctors appointments due to COVID. She reports she has not gotten the COVID vaccine and does not plan on taking it at this time. She states she does not feel there is enough information to support the vaccine and its success in keeping people from catching it. Client verbalized appreciation of RNCM providing an informative discussion but states this is where she stands at this time.   RNCM contacted Dr. Jodelle Red, cardiology office and spoke with [facility].  Follow up appointment scheduled for client for 05/16/20 at 1:20pm.  RNCM attempted to call client x 2 to inform her of appointment. Unable to reach x 2.  Will attempt follow up call to client.   Goals Addressed              This Visit's Progress      Acknowledge receipt of Advanced Directive package   On track     RN case manager will re-send Advance Directive packet due to client being unsure if received.  Call RN case  manager for social work referral if assistance is needed with Advanced Directive.        Client understands the importance of follow-up with providers by attending scheduled visits   Not on track     Most recent follow up with primary care provider 12/03/19 and 03/14/20 Cardiology follow up 08/01/18.  RNCM contacted clients cardiology office to schedule follow visit.  Visit scheduled for 05/16/20.  Continue to keep follow up appointments with your providers.         Client verbalize knowledge of Heart Failure disease self management skills within 6 months   Not on track     Client states she is unsure of Heart failure diagnosis.  RNCM contacted clients cardiology office to schedule follow visit.  Visit scheduled for 05/16/20. Please follow up with your cardiologist to discuss diagnosed conditions and treatment plan.  Continue to take your medications as prescribed.  Refer to your [employer] calendar for heart failure action plan Continue  to Weigh daily and record weights Continue to Adhere to low salt diet        Client will express improved sleep quality in 3 months.   On track     RN case manager will send client education article: Tips for getting better sleep. Continue taking your prescribed medication as recommended by your doctor.  Maintain a consistent time for going to bed and waking up Adjust room temperature and ventilation Darken room light Read or listen  to calming music Warm bath Avoid heavy meals and limit fluid intake before bedtime.  Contact your doctor for questions.       Client will report having contact with HTA concierge to discuss benefit coverage for referral to rheumatologist and/ or specialist within 3 weeks.   Not on track     Client reports she is unsure if she received call from [employer].  Client advised to contact her [employer] concierge regarding benefit coverage for specialist.        COMPLETED: Client will  verbalize having contact from social worker to discuss personal / home care needs in 3 months.   Not on track     Client denies need for social work services at this time.        Client will verbalize improvement in health care needs in 3 months   On track     RN case manager will contact clients primary care provider to discuss current plan of care RN case manager will assist client with care coordination as needed.  RN case manager assisted client with scheduling follow up visit with cardiologist.  RN case manager will provider client opportunity  to express concerns and feelings regarding her health conditions/ needs.        client will verbalize increase knowledge of COVID vaccine benefits in 3 months.        RN case Production designer, theatre/television/film discussed COVID vaccine benefits with client RN case manager will send client education articles: COVID 19 overview and COVID 19 vaccines.       client will verbalize leg pain level has decreased to a 2 on a 10 point scale in 6 months.        RN case manager assisted client with scheduling follow up with cardiologist to review cardiac conditions and treatment plan  RN case manager advised client to contact her orthopedic doctors office to scheduled follow up visit.  Elevate legs above heart when sitting / laying Plan to follow a low salt diet.  Review [employer] exercise booklet sent to you in the mail for chair exercises (Please ask your doctor if exercises are recommended for you prior to performing)       Diabetes Patient stated goal: "I want to understand diabetes" (pt-stated)   On track     RN case manager reviewed diabetes self management actions Visit provider every 3-6 months as directed  Hbg A1C level every 3-6 months.  Diabetes diet meal planning (RN case manager will mail client education article on Diabetic meal planning,   Physical activity   RN contacted clients primary care provider regarding diabetes treatment plan: Advisement:  client does not need to check blood sugars at this time and diabetes treatment plan would be discussed at next follow up visit/ Hgb A1c.        HEMOGLOBIN A1C < 7        Hgb A1c completed 03/14/20 was 6.7 RN case manager reviewed diabetes self management actions Visit provider every 3-6 months as directed  Hbg A1C level every 3-6 months.  Diabetes diet meal planning (RN case manager will mail client education article on Diabetic meal planning,   Physical activity   RN contacted clients primary care provider regarding diabetes treatment plan: Advisement: client does not need to check blood sugars at this time and diabetes treatment plan would be discussed at next follow up visit/ Hgb A1c.        COMPLETED: Obtain annual  Lipid  Profile, LDL-C   On track     Lipid profile was completed 02/08/20       Obtain Annual Eye (retinal)  Exam    Not on track     Client reports she has not had an eye doctor appointment in years.  RN case manager discussed importance of diabetic eye exams yearly.  RN advised client to contact her [employer] concierge to request list of participating providers.  Advised client to schedule appointment with eye doctor.       Obtain Annual Foot Exam   Not on track     Last foot exam date not noted in chart It is important that your feet are checked regularly by your doctor. Discuss this with your doctor at your next follow up.  Diabetes can affect the nerves in your feet causing decreased feeling or numbness. If you experience these symptoms report this to your doctor as soon as possible.         Obtain annual screen for micro albuminuria (urine) , nephropathy (kidney problems)   Not on track     Unable to locate micro albuminuria in chart.  Obtain your annual screen for microalbuminuria - diabetes can affect your kidneys - it is important for your doctor to check your urine at least once per year. These tests show how your kidney's are working.   Discuss this with your doctor at your next follow up visit.       COMPLETED: Obtain Hemoglobin A1C at least 2 times per year   On track     Hgb A1c completed 12/03/19 and 03/14/20       Visit Primary Care Provider or Endocrinologist at least 2 times per year    On track     Most recent primary care provider appointment: 12/03/19 and 03/14/20 Continue to follow up with your doctor regularly and have lab work completed as recommended.        Plan:  Send successful outreach letter with a copy of their individualized care plan, Send individual care plan to provider and Send educational material  Chronic care management coordinator will outreach in:  3 Months     [name] RN,BSN,CCM Chronic Care Management Coordinator [facility] Care Management [phone]    .

## 2020-04-15 ENCOUNTER — Other Ambulatory Visit: Payer: Self-pay

## 2020-04-15 NOTE — Patient Outreach (Signed)
  [facility]) Care Management Chronic Special Needs Program    04/15/2020  Name: Sue [name], DOB: March 22, 1930  MRN: [id number]   Ms. [name] is enrolled in a chronic special needs plan for Diabetes. Telephone call received from client. HIPAA verified. Client states she contact her primary care provider office 3 times today regarding swelling in her ankles and feet discomfort when walking. She states she is concerned about the swelling and wanted to know from her doctor if she can increase her lasix and potassium medication. Client denies shortness of breath.   Client states she spoke with Noreene Larsson her doctors nurse but has not gotten an answer. She states she is very frustrated because she has not received an answer yet. RNCM offered to call her primary care providers office to discuss concerns and symptoms. Client verbally agreed.  RNCM contacted Noreene Larsson with Dr. Rondel Baton office. Explained that client is concerned about symptoms of increased ankle swelling swelling and feet discomfort when walking.   Noreene Larsson states she has left a message for Dr. Hyacinth Meeker and is waiting for a response. Noreene Larsson states she will contact client with Dr. Rondel Baton recommendations  once received.  RNCM contacted client and informed her that the nurse is waiting for a response from Dr. Hyacinth Meeker and she will call her with recommendations.  RNCM advised client if her symptoms worsen to follow up at the emergency department or if symptoms become severe call 911. Client verbalized appreciation for follow up.   Goals Addressed            This Visit's Progress   . Client will verbalize contact with primary care provider office regarding ankle swelling/ feet discomfort symptoms in 1 week.       RN case manager contact client's primary car provider office to report symptoms of increased ankle swelling and feet discomfort.       PLAN; RNCM will follow up with client in 3 business days.    [name]  RN,BSN,CCM Chronic Care Management Coordinator [facility] Care Management [phone]

## 2020-05-13 ENCOUNTER — Other Ambulatory Visit: Payer: Self-pay

## 2020-05-13 NOTE — Patient Outreach (Signed)
  [facility]) Care Management Chronic Special Needs Program    05/13/2020  Name: Sue [name], DOB: 10-08-1929  MRN: [id number]   [name] is enrolled in a chronic special needs plan for Diabetes. Returned telephone call to client. HIPAA verified. Client states she wasn't sure if she should keep her cardiology follow up appointment on Friday because she does not want to go in the hospital for a stent placement with all that's going on with COVID. RNCM encouraged client to attend upcoming visit with cardiologist for follow up due to last visit being in November 2019.  Advised client to discuss stent concerns with cardiologist. Client verbally agreed and stated she would keep follow up appointment.   PLAN; RNCM will follow up with client at next scheduled outreach.  [name] RN,BSN,CCM Chronic Care Management Coordinator [facility] Care Management [phone]

## 2020-05-16 ENCOUNTER — Ambulatory Visit: Payer: HMO | Admitting: Cardiology

## 2020-06-19 ENCOUNTER — Ambulatory Visit: Payer: HMO | Admitting: Cardiology

## 2020-06-25 ENCOUNTER — Other Ambulatory Visit: Payer: Self-pay

## 2020-06-25 NOTE — Patient Outreach (Signed)
  [facility]) Care Management Chronic Special Needs Program    06/25/2020  Name: Sue [name], DOB: June 17, 1930  MRN: [id number]   [name] is enrolled in a chronic special needs plan for Diabetes. Return telephone call to client. HIPAA verified. Client states she is not able to talk at this time and request return call from [facility] later today because she has a few questions.   PLAN; RNCM will return call to client today as requested.   [name] RN,BSN,CCM Chronic Care Management Coordinator [facility] Care Management [phone]

## 2020-06-26 ENCOUNTER — Other Ambulatory Visit: Payer: Self-pay

## 2020-06-26 NOTE — Patient Outreach (Signed)
  [facility]) Care Management Chronic Special Needs Program    06/26/2020  Name: Sue [name], DOB: 16-Oct-1929  MRN: [id number]   Sue [name] is enrolled in a chronic special needs plan for Diabetes.  Return telephone call to client. HIPAA verified. Client states she feels she is still not getting the answers she needs regarding the chronic pain in her legs. She reports having follow up with her primary care provider. Client reports she has been prescribed meloxicam in the past for her leg pain but this does not seem to help.  She reports the discomfort in her legs have made it difficult for her to walk at times even with her walker. Client reports she did not attend the follow up appointment with her cardiologist that was recently arranged by this RNCM.  Client states she has not taken the COVID vaccine therefore she would not feel comfortable having testing done where there is a lot of people.  Client states her primary care provider has prescribed meloxicam for her in the past which does not help her pain. RNCM offered to follow up with clients primary care provider to discuss concerns.  Client verbalized agreement.   [name] RN,BSN,CCM Chronic Care Management Coordinator [facility] Care Management [phone]

## 2020-07-01 ENCOUNTER — Other Ambulatory Visit: Payer: Self-pay

## 2020-07-01 NOTE — Patient Outreach (Addendum)
  [facility]) Care Management Chronic Special Needs Program    07/01/2020  Name: Sue [name], DOB: Sep 09, 1929  MRN: [id number]   [name] is enrolled in a chronic special needs plan for Diabetes. Telephone call to client's primary care provider office. Message left with Angelica Chessman to have primary care provider nurse return call regarding client medical concerns.   Return call received from [place] with Dr. Rondel Baton office. Notified Sue [name] of clients concerns regarding her ongoing leg pain. Client reports current pain medication, meloxicam is not working and client feels she is not getting answers regarding her ongoing leg pain.   Return call received from [facility] stating Dr. Hyacinth Meeker wants patient to schedule a follow up appointment to address her concerns.  RNCM contacted client and informed her of primary care provider request. RNCM offered to call provider office to schedule appointment. Client declined stating she would call provider office and schedule herself.   Goals Addressed            This Visit's Progress   . Client will verbalize improvement in health care needs in 3 months       RN case manager will contact primary care provider office to discuss clients concerns regarding ongoing leg pain.       . client will verbalize leg pain level has decreased to a 2 on a 10 point scale in 6 months.       RN case manager will contact primary care provider office to discuss client's concerns regarding ongoing leg pain.       PLAN: RNCM will await return call from provider office.  Addendum:  Return call received from primary care provider office.   [name] RN,BSN,CCM Chronic Care Management Coordinator [facility] Care Management [phone]

## 2020-07-08 ENCOUNTER — Ambulatory Visit: Payer: Self-pay

## 2020-07-09 ENCOUNTER — Other Ambulatory Visit: Payer: Self-pay

## 2020-07-09 ENCOUNTER — Ambulatory Visit: Payer: Self-pay

## 2020-07-10 NOTE — Patient Outreach (Signed)
  [facility]) Care Management Chronic Special Needs Program    07/10/2020  Name: Sue [name], DOB: 09-Dec-1929  MRN: [id number]   [employer] Care management team has assumed care and services for this member.  Case closed by [facility] care management.   [name] RN,BSN,CCM Chronic Care Management Coordinator [facility] Care Management [phone]

## 2020-07-18 ENCOUNTER — Telehealth: Payer: Self-pay | Admitting: Cardiology

## 2020-07-18 NOTE — Telephone Encounter (Signed)
Antanette with [employer] states the patient is unable to arrange transportation for appointment scheduled for 07/21/20 at 3:00 PM with Dr. Cristal Deer. Antanette would like to know if our office has any recommendations for affordable transportation services. Is social worker able to assist? Please advise.

## 2020-07-18 NOTE — Telephone Encounter (Signed)
CSW received question regarding transportation for pt to get to appointment on Monday. CSW called pt- introduced self, role, reason for call. Pt aware that [facility] has a Radiation protection practitioner. She is okay with CSW setting up free ride to appointment on Monday with Dr. Cristal Deer.   CSW called transportation services and scheduled pick up for pt for 2:15pm for 3pm appointment. CSW also called and explained this to Antanette with [employer]. We will touch base Monday again and make sure pt is able to get to her appointment.   Octavio Graves, MSW, LCSW [facility] Health Clinical Social Work

## 2020-07-21 ENCOUNTER — Telehealth: Payer: Self-pay | Admitting: Licensed Clinical Social Worker

## 2020-07-21 ENCOUNTER — Encounter: Payer: Self-pay | Admitting: Cardiology

## 2020-07-21 ENCOUNTER — Ambulatory Visit (INDEPENDENT_AMBULATORY_CARE_PROVIDER_SITE_OTHER): Payer: HMO | Admitting: Cardiology

## 2020-07-21 VITALS — BP 110/80 | HR 89 | Ht 64.0 in | Wt 202.0 lb

## 2020-07-21 DIAGNOSIS — R600 Localized edema: Secondary | ICD-10-CM

## 2020-07-21 DIAGNOSIS — E785 Hyperlipidemia, unspecified: Secondary | ICD-10-CM | POA: Diagnosis not present

## 2020-07-21 DIAGNOSIS — I5042 Chronic combined systolic (congestive) and diastolic (congestive) heart failure: Secondary | ICD-10-CM | POA: Diagnosis not present

## 2020-07-21 DIAGNOSIS — E119 Type 2 diabetes mellitus without complications: Secondary | ICD-10-CM

## 2020-07-21 DIAGNOSIS — Z7189 Other specified counseling: Secondary | ICD-10-CM

## 2020-07-21 NOTE — Telephone Encounter (Addendum)
9:52am- CSW spoke with pt and Antanette w/ [employer]. Pt aware that ride is scheduled. She had questions regarding her ride home. Explained that we could assist with ensuring Transportation Services is contacted when pt ready for pick up. I reached out to [place] to let them know I would be assisting with scheduling pt ride home when she is done with appointment w/ Dr. Cristal Deer.   8:58am- CSW called and confirmed ride scheduled for pt to pick her up at 2:15pm today. CSW also called [employer] liaison Antanette and left message as she was assisting w/ ensuring pt/pt family ready for ride today.  Octavio Graves, MSW, LCSW [facility] Health Clinical Social Work

## 2020-07-21 NOTE — Patient Instructions (Addendum)
Medication Instructions:  Keep an eye out for the compression stockings--I shipped them to your apartment. Put on first thing in the morning and then take off before bed.   You can take one dose of the lasix/potassium in the morning and one dose in the afternoon (don't take right before bed).  *If you need a refill on your cardiac medications before your next appointment, please call your pharmacy*   Lab Work: None   Testing/Procedures: None   Follow-Up: At [facility], you and your health needs are our priority.  As part of our continuing mission to provide you with exceptional heart care, we have created designated Provider Care Teams.  These Care Teams include your primary Cardiologist (physician) and Advanced Practice Providers (APPs -  Physician Assistants and Nurse Practitioners) who all work together to provide you with the care you need, when you need it.  We recommend signing up for the patient portal called "MyChart".  Sign up information is provided on this After Visit Summary.  MyChart is used to connect with patients for Virtual Visits (Telemedicine).  Patients are able to view lab/test results, encounter notes, upcoming appointments, etc.  Non-urgent messages can be sent to your provider as well.   To learn more about what you can do with MyChart, go to [email or web address].    Your next appointment:   3 month(s)  The format for your next appointment:   In Person  Provider:   Jodelle Red, MD

## 2020-07-21 NOTE — Progress Notes (Signed)
Heart and Vascular Care Navigation  07/21/2020  Sue [name] Oct 16, 1929 [id number]  Reason for Referral:  CSW consulted for transportation challenges                                                                                                   Assessment: CSW met with pt after visit today in clinic. She was able to utilize the [employer] to get to this appointment. Pt let this writer know that her PCP has changed to Dr. Kathyrn Lass, I will adjust in the system. Pt from home with her son, she shares with this Probation officer that she feels bad that he has to assist her. We discussed that providing care for our family members is indeed sometimes stressful but that it is a good thing that she has his support in her life and that we are here to support both of them the best we can. I am aware that we have contacted her insurance regarding a BP cuff and scale. If they are unable to assist we will see if she can get these from our office.   I let pt know that should she have any additional concerns about her mood, obtaining medications or getting to her appointments to call the office and speak with me to see how we can assist.   Pt expresses gratitude to the care team for assisting her with her appointment and transportation.                                        HRT/VAS Care Coordination    Patients Home Cardiology Office [place] Team Social Worker   Social Worker Name: Westley Hummer, [place], [place]   Living arrangements for the past 2 months Apartment   Lives with: Self; Adult Children   Patient Current Insurance Coverage Managed Medicare   Patient Has Concern With Paying Medical Bills No   Does Patient Have Prescription Coverage? Yes   Home Assistive Devices/Equipment Walker (specify type)  metal frame walker   DME Agency [employer]   Current home services DME      Social History:                                                                              SDOH Screenings   Alcohol Screen:   . Last Alcohol Screening Score (AUDIT): Not on file  Depression (PHQ2-9): Medium Risk  . PHQ-2 Score: 15  Financial Resource Strain: Low Risk   . Difficulty of Paying Living Expenses: Not very hard  Food Insecurity: No Food Insecurity  . Worried About Charity fundraiser in the Last Year: Never true  . Ran Out  of Food in the Last Year: Never true  Housing: Low Risk   . Last Housing Risk Score: 0  Physical Activity:   . Days of Exercise per Week: Not on file  . Minutes of Exercise per Session: Not on file  Social Connections:   . Frequency of Communication with Friends and Family: Not on file  . Frequency of Social Gatherings with Friends and Family: Not on file  . Attends Religious Services: Not on file  . Active Member of Clubs or Organizations: Not on file  . Attends Archivist Meetings: Not on file  . Marital Status: Not on file  Stress:   . Feeling of Stress : Not on file  Tobacco Use: Medium Risk  . Smoking Tobacco Use: Former Smoker  . Smokeless Tobacco Use: Never Used  Transportation Needs: Unmet Transportation Needs  . Lack of Transportation (Medical): Yes  . Lack of Transportation (Non-Medical): Yes    SDOH Interventions: Financial Resources:  Financial Strain Interventions: Intervention Not Indicated   Food Insecurity:  Food Insecurity Interventions: Intervention Not Indicated  Housing Insecurity:  Housing Interventions: Intervention Not Indicated  Transportation:       Other Care Navigation Interventions:     Patient expressed Mental Health concerns Stress surrounding being assisted by her son.  Patient Referred to: Offered resources, pt declines at this time.    Follow-up plan:   CSW will f/u w/ pt to schedule additional rides/assist as needed.

## 2020-07-21 NOTE — Progress Notes (Signed)
Cardiology Office Note:    Date:  07/21/2020   ID:  Sue [name], DOB [birth year], MRN [id number]  PCP:  Seward Carol, MD  Cardiologist:  Buford Dresser, MD PhD  Referring MD: Seward Carol, MD   CC: follow up  History of Present Illness:    Sue [name] is a 86 y.o. female with a hx of hypothyroidism, RBBB, chronic systolic and diastolic heart failure who is seen for follow up today. I initially met her 08/01/18 as a new consult at the request of Seward Carol, MD for the evaluation and management of abnormal echocardiogram.  Today: Main concern today is LE edema. No PND or orthopnea, sleeps on 1 pillow. Can vacuum the house. Taking furosemide and potassium BID, unsure of dose. Discussed potential etiologies of LE edema, management.   Denies chest pain, shortness of breath at rest or with normal exertion. No PND, orthopnea or unexpected weight gain. No syncope or palpitations.  Past Medical History:  Diagnosis Date  . CHF (congestive heart failure) ([place])   . Diabetes mellitus without complication ([place])   . Hyperlipidemia   . Thyroid disease    Reported PMH/per notes: Hypercholesterolemia Hypothyroidism Osteoarthritis Anxiety and depression Chronic systolic and diastolic heart failure Right bundle branch block Fatigue  No past surgical history on file.  Current Medications: Current Outpatient Medications on File Prior to Visit  Medication Sig  . carvedilol (COREG) 3.125 MG tablet TAKE TWO TABLETS BY MOUTH TWICE A DAY  . clonazePAM (KLONOPIN) 0.5 MG tablet Take 0.5 mg by mouth 2 (two) times daily as needed for anxiety.  . furosemide (LASIX) 20 MG tablet Take 20 mg by mouth daily.   Marland Kitchen ibuprofen (ADVIL,MOTRIN) 800 MG tablet Take 800 mg by mouth every 8 (eight) hours as needed.  Marland Kitchen levothyroxine (SYNTHROID, LEVOTHROID) 100 MCG tablet Take 100 mcg by mouth daily before breakfast.  . levothyroxine (SYNTHROID, LEVOTHROID) 112 MCG tablet   . meloxicam (MOBIC)  15 MG tablet Take 15 mg by mouth daily.  . potassium chloride (K-DUR,KLOR-CON) 10 MEQ tablet Take 10 mEq by mouth 2 (two) times daily.   . simvastatin (ZOCOR) 20 MG tablet Take 20 mg by mouth daily.  Marland Kitchen azithromycin (ZITHROMAX) 250 MG tablet  (Patient not taking: Reported on 04/14/2020)  . cholecalciferol (VITAMIN D) 1000 units tablet Take 1,000 Units by mouth daily. (Patient not taking: Reported on 04/14/2020)  . citalopram (CELEXA) 40 MG tablet Take 40 mg by mouth daily. (Patient not taking: Reported on 04/14/2020)  . metFORMIN (GLUCOPHAGE) 500 MG tablet  (Patient not taking: Reported on 04/14/2020)  . potassium chloride (K-DUR) 10 MEQ tablet   . traMADol (ULTRAM) 50 MG tablet  (Patient not taking: Reported on 04/14/2020)   No current facility-administered medications on file prior to visit.     Allergies:   Patient has no known allergies.   Social History   Tobacco Use  . Smoking status: Former Smoker    Types: Cigarettes    Quit date: 09/06/1958    Years since quitting: 62.1  . Smokeless tobacco: Never Used  Vaping Use  . Vaping Use: Never used  Substance Use Topics  . Alcohol use: No  . Drug use: No    Family History: The patient's mother passed away at age 50, father passed away age 13, unclear cause. Son passed away as a result of injuries from a car accident. Three living children.  ROS:   Please see the history of present illness.  Additional pertinent ROS otherwise  unremarkable.  EKGs/Labs/Other Studies Reviewed:    The following studies were reviewed today: Echo 06/30/18 Study Conclusions - Left ventricle: The cavity size was normal. Wall thickness was   increased in a pattern of mild LVH. Systolic function was mildly   to moderately reduced. The estimated ejection fraction was in the   range of 40% to 45%. Incoordinate septal motion and anterolateral   hypokinesis. Doppler parameters are consistent with abnormal left   ventricular relaxation (grade 1 diastolic  dysfunction). The E/e&'   ratio is between 8-15, suggesting indeterminate LV filling   pressure. - Mitral valve: Mildly thickened leaflets . There was trivial   regurgitation. - Left atrium: The atrium was normal in size. - Pericardium, extracardiac: A trivial pericardial effusion was   identified posterior to the heart.  Impressions: - LVEF 40-45%, mild LVH, incoordinate septal motion and   anterolateral hypokinesis, grade 1 DD, indeterminate LV filling   pressure, normal LA size, trivial posterior pericardial effusion.  EKG:  EKG is personally reviewed.  The ekg ordered today demonstrates normal sinus rhythm with RBBB  Recent Labs: No results found for requested labs within last 8760 hours.  Recent Lipid Panel No results found for: CHOL, TRIG, HDL, CHOLHDL, VLDL, LDLCALC, LDLDIRECT  Physical Exam:    VS:  BP 110/80 (BP Location: Left Arm, Patient Position: Sitting)   Pulse 89   Ht 5\' 4"  (1.626 m)   Wt 202 lb (91.6 kg)   SpO2 96%   BMI 34.67 kg/m     Wt Readings from Last 3 Encounters:  07/21/20 202 lb (91.6 kg)  08/01/18 206 lb (93.4 kg)    GEN: Well nourished, well developed in no acute distress HEENT: Normal, moist mucous membranes NECK: No JVD CARDIAC: regular rhythm, normal S1 and S2, no rubs or gallops. No murmur. VASCULAR: Radial and DP pulses 2+ bilaterally. No carotid bruits RESPIRATORY:  Clear to auscultation without rales, wheezing or rhonchi  ABDOMEN: Soft, non-tender, non-distended MUSCULOSKELETAL:  Ambulates independently SKIN: Warm and dry, 1+ bilateral LE edema NEUROLOGIC:  Alert and oriented x 3. No focal neuro deficits noted. PSYCHIATRIC:  Normal affect   ASSESSMENT:    No diagnosis found. PLAN:    Chronic systolic and diastolic heart failure, with major symptom of LE edema: -echo as above shows EF 40-45%, with anterolateral hypokinesis and grade 1 diastolic dysfunction -see prior discussion. She declines ischemic evaluation -appears  euvolemic today. Given instructions on heart failure education -tolerating carvedilol -we had planned for ACEi/ARB given reduced EF. Had discussed entresto. Does not appear that she was started on these. With diuretic recommendations (below), we will hold on ARB at this time but discuss at next visit -discussed compression stockings, elevation, salt avoidance given LE edema -discussed that she can take lasix/potassium BID on days where her weight is elevated or legs are very swollen. If this becomes routine she will call and let me know  Type II diabetes: -A1c 6.7, which meets diagnosis criteria -was on metformin, no longer taking -consider SGLT2i in the future given cardiomyopathy  Hyperlipidemia: -given age, will not intensify beyond simvastatin after shared decision making  CV risk counseling and prevention: -recommend heart healthy/Mediterranean diet, with whole grains, fruits, vegetable, fish, lean meats, nuts, and olive oil. Limit salt. -recommend moderate walking, 3-5 times/week for 30-50 minutes each session. Aim for at least 150 minutes.week. Goal should be pace of 3 miles/hours, or walking 1.5 miles in 30 minutes -recommend avoidance of tobacco products. Avoid excess alcohol.  Plan  for follow up: 3 mos or sooner as needed to monitor symptoms  Buford Dresser, MD, PhD [place]  [facility]   Medication Adjustments/Labs and Tests Ordered: Current medicines are reviewed at length with the patient today.  Concerns regarding medicines are outlined above.  Orders Placed This Encounter  Procedures  . EKG 12-Lead   No orders of the defined types were placed in this encounter.   Patient Instructions  Medication Instructions:  Keep an eye out for the compression stockings--I shipped them to your apartment. Put on first thing in the morning and then take off before bed.   You can take one dose of the lasix/potassium in the morning and one dose in the afternoon (don't  take right before bed).  *If you need a refill on your cardiac medications before your next appointment, please call your pharmacy*   Lab Work: None   Testing/Procedures: None   Follow-Up: At [facility], you and your health needs are our priority.  As part of our continuing mission to provide you with exceptional heart care, we have created designated Provider Care Teams.  These Care Teams include your primary Cardiologist (physician) and Advanced Practice Providers (APPs -  Physician Assistants and Nurse Practitioners) who all work together to provide you with the care you need, when you need it.  We recommend signing up for the patient portal called "MyChart".  Sign up information is provided on this After Visit Summary.  MyChart is used to connect with patients for Virtual Visits (Telemedicine).  Patients are able to view lab/test results, encounter notes, upcoming appointments, etc.  Non-urgent messages can be sent to your provider as well.   To learn more about what you can do with MyChart, go to [email or web address].    Your next appointment:   3 month(s)  The format for your next appointment:   In Person  Provider:   Buford Dresser, MD       Signed, Buford Dresser, MD PhD 07/21/2020  [place]

## 2020-07-22 ENCOUNTER — Ambulatory Visit: Payer: HMO | Admitting: Cardiology

## 2020-09-23 ENCOUNTER — Ambulatory Visit: Payer: HMO | Admitting: Cardiology

## 2020-10-09 ENCOUNTER — Telehealth: Payer: Self-pay | Admitting: Cardiology

## 2020-10-09 NOTE — Telephone Encounter (Signed)
Pt c/o swelling: STAT is pt has developed SOB within 24 hours  1) How much weight have you gained and in what time span?   2) If swelling, where is the swelling located? From the knee down  3) Are you currently taking a fluid pill? yes  4) Are you currently SOB? yes  5) Do you have a log of your daily weights (if so, list)? No. Pt has been in bed   6) Have you gained 3 pounds in a day or 5 pounds in a week? Not sure       Pt c/o Shortness Of Breath: STAT if SOB developed within the last 24 hours or pt is noticeably SOB on the phone  1. Are you currently SOB (can you hear that pt is SOB on the phone)?pt states she is not sob but she just says it is difficult to talk (pt sounds SOB)  2. How long have you been experiencing SOB?   3. Are you SOB when sitting or when up moving around? All the time  4. Are you currently experiencing any other symptoms? No   Patient is concerned about fluid build up and swelling. She saw an internal medicine doctor but does not feel satisfied with the care she has gotten from them

## 2020-10-09 NOTE — Telephone Encounter (Signed)
Spoke with pt, she reports swelling in her legs and feet, she was seen by her PCP and they sent her to a urologist who said they could not help to call us. She uses a walker but reports it is hard to walk due to the swelling. She does not indorse SOB but sounds congested and SOB on the phone. She has been wearing her compression hose but now they are too hard to get on. Patient was instructed to increase her furosemide to 40 mg twice daily for the next 3 days and then call if she has no improvement. She has a follow up with dr Cristal Deer 10/21/20, she will call prior to that appointment if concerned.

## 2020-10-18 ENCOUNTER — Telehealth: Payer: Self-pay | Admitting: Medical

## 2020-10-18 NOTE — Telephone Encounter (Signed)
   Patient called the after hours line to report issues with her compression stockings. She reports taking her stockings off yesterday and a small area of skin on her anterior tibia above her ankle came off. It initially bled but is now crusted over. She did not put her stockings back on. There is no significant swelling, redness, or pus drainage. Instructed patient to keep it clean. She can replace her compression stockings if the area is well protected. She is scheduled to see Dr. Cristal Deer 10/21/20. Will route to Dr. Cristal Deer as Lorain Childes in hopes that she can take a peak at it and insure no infection. Patient was appreciative of the call.   Beatriz Stallion, PA-C 10/18/20; 12:16 PM

## 2020-10-21 ENCOUNTER — Telehealth: Payer: Self-pay | Admitting: Cardiology

## 2020-10-21 ENCOUNTER — Ambulatory Visit: Payer: HMO | Admitting: Cardiology

## 2020-10-21 NOTE — Telephone Encounter (Signed)
Returned the call to the patient. She stated that she had to cancel her appointemnt with Dr. Cristal Deer due to being tired and her transportation cancelling.   She stated that she has a tightness in her feet and swelling but denies any weight gain. Her weight this morning was 194. She stated that she can now see the veins in her feet due to the tightness. She denies a temperature change on her feet. She does wear compression stockings during the day and has been advised to also elevate her legs and watch her sodium intake.   She is currently taking Furosemide 40 mg once daily and potassium 10 mEq once daily.

## 2020-10-21 NOTE — Telephone Encounter (Signed)
  Pt c/o swelling: STAT is pt has developed SOB within 24 hours  1) How much weight have you gained and in what time span? Doesn't think she has gained any  2) If swelling, where is the swelling located? Feet and ankles, patient states she feels tightness in her feet and she feels very tired  3) Are you currently taking a fluid pill? yes  4) Are you currently SOB? no  5) Do you have a log of your daily weights (if so, list)? no  6) Have you gained 3 pounds in a day or 5 pounds in a week? Not sure  7) Have you traveled recently? no

## 2020-10-23 NOTE — Telephone Encounter (Signed)
Attempted to contact pt. Unable to leave message as mailbox is not set up.    

## 2020-10-23 NOTE — Telephone Encounter (Signed)
Pt updated and verbalized understanding.  

## 2020-10-23 NOTE — Telephone Encounter (Signed)
Without weight gain, we have to be cautious about lasix so that we don't injure the kidneys. I is ok to take the lasix and potassium twice a day for three days, but after that I would return to daily dosing. Agree with compression, elevation, and avoiding salt.

## 2020-10-23 NOTE — Telephone Encounter (Signed)
Pt is returning call.  

## 2020-10-26 ENCOUNTER — Encounter: Payer: Self-pay | Admitting: Cardiology

## 2020-11-07 ENCOUNTER — Telehealth: Payer: Self-pay | Admitting: Cardiology

## 2020-11-07 NOTE — Telephone Encounter (Signed)
Pt report she has recently noticed small skin breakdown and bleeding when she takes off her compression stockings.   Nurse advised pt to contact pcp as they could possibly prescription some cream/oinment that can help with dryness.  Pt verbalized understanding.

## 2020-11-07 NOTE — Telephone Encounter (Signed)
    Pt said every night when she take out her compression stockings, her skin breaks out and was bleeding. She wanted to get Dr. Di Kindle recommendations

## 2020-12-05 ENCOUNTER — Ambulatory Visit: Payer: HMO | Admitting: Cardiology

## 2021-01-05 ENCOUNTER — Telehealth: Payer: Self-pay | Admitting: Cardiology

## 2021-01-05 NOTE — Telephone Encounter (Signed)
She could try tylenol or something else topical like aspercreme with lidocaine or icy-hot

## 2021-01-05 NOTE — Telephone Encounter (Signed)
Spoke with pt, aware of the recommendations.  

## 2021-01-05 NOTE — Telephone Encounter (Signed)
Sue [name] is calling stating she is having pain in her feet and knees. She states she has arthritis and was advised there was no medicine they could give her to alleviate the pain.  She is wanting to know Dr. Di Kindle recommendations to help treat the discomfort including what medications she recommends. Please advise.

## 2021-01-05 NOTE — Telephone Encounter (Signed)
Spoke with pt, her meloxicam was stopped by her medical doctor, she does not know why. She has tried Voltaren gel and it does not help. She is not willing to try OTC medications unless we give the okay. Aware dr Cristal Deer is out of the office so will forward to pharm md for advise.

## 2021-01-09 ENCOUNTER — Inpatient Hospital Stay (HOSPITAL_COMMUNITY)
Admission: EM | Admit: 2021-01-09 | Discharge: 2021-01-13 | DRG: 193 | Disposition: A | Discharge status: Home Health | Payer: HMO | Attending: Internal Medicine | Admitting: Internal Medicine

## 2021-01-09 ENCOUNTER — Encounter (HOSPITAL_COMMUNITY): Payer: Self-pay

## 2021-01-09 ENCOUNTER — Emergency Department (HOSPITAL_COMMUNITY): Payer: HMO

## 2021-01-09 DIAGNOSIS — Z79899 Other long term (current) drug therapy: Secondary | ICD-10-CM

## 2021-01-09 DIAGNOSIS — E039 Hypothyroidism, unspecified: Secondary | ICD-10-CM | POA: Diagnosis present

## 2021-01-09 DIAGNOSIS — E1159 Type 2 diabetes mellitus with other circulatory complications: Secondary | ICD-10-CM | POA: Diagnosis present

## 2021-01-09 DIAGNOSIS — Z87891 Personal history of nicotine dependence: Secondary | ICD-10-CM | POA: Diagnosis not present

## 2021-01-09 DIAGNOSIS — Z6834 Body mass index (BMI) 34.0-34.9, adult: Secondary | ICD-10-CM

## 2021-01-09 DIAGNOSIS — I5033 Acute on chronic diastolic (congestive) heart failure: Secondary | ICD-10-CM

## 2021-01-09 DIAGNOSIS — E785 Hyperlipidemia, unspecified: Secondary | ICD-10-CM | POA: Diagnosis present

## 2021-01-09 DIAGNOSIS — F419 Anxiety disorder, unspecified: Secondary | ICD-10-CM | POA: Diagnosis present

## 2021-01-09 DIAGNOSIS — R0609 Other forms of dyspnea: Secondary | ICD-10-CM | POA: Diagnosis not present

## 2021-01-09 DIAGNOSIS — R5382 Chronic fatigue, unspecified: Secondary | ICD-10-CM | POA: Insufficient documentation

## 2021-01-09 DIAGNOSIS — E1142 Type 2 diabetes mellitus with diabetic polyneuropathy: Secondary | ICD-10-CM | POA: Diagnosis not present

## 2021-01-09 DIAGNOSIS — J13 Pneumonia due to Streptococcus pneumoniae: Secondary | ICD-10-CM | POA: Diagnosis not present

## 2021-01-09 DIAGNOSIS — E876 Hypokalemia: Secondary | ICD-10-CM

## 2021-01-09 DIAGNOSIS — E119 Type 2 diabetes mellitus without complications: Secondary | ICD-10-CM

## 2021-01-09 DIAGNOSIS — M19079 Primary osteoarthritis, unspecified ankle and foot: Secondary | ICD-10-CM | POA: Insufficient documentation

## 2021-01-09 DIAGNOSIS — J9601 Acute respiratory failure with hypoxia: Secondary | ICD-10-CM | POA: Diagnosis present

## 2021-01-09 DIAGNOSIS — N1831 Chronic kidney disease, stage 3a: Secondary | ICD-10-CM | POA: Diagnosis present

## 2021-01-09 DIAGNOSIS — E1169 Type 2 diabetes mellitus with other specified complication: Secondary | ICD-10-CM | POA: Diagnosis present

## 2021-01-09 DIAGNOSIS — N390 Urinary tract infection, site not specified: Secondary | ICD-10-CM | POA: Diagnosis present

## 2021-01-09 DIAGNOSIS — I5042 Chronic combined systolic (congestive) and diastolic (congestive) heart failure: Secondary | ICD-10-CM | POA: Insufficient documentation

## 2021-01-09 DIAGNOSIS — I10 Essential (primary) hypertension: Secondary | ICD-10-CM | POA: Diagnosis not present

## 2021-01-09 DIAGNOSIS — Z79890 Hormone replacement therapy (postmenopausal): Secondary | ICD-10-CM

## 2021-01-09 DIAGNOSIS — I5032 Chronic diastolic (congestive) heart failure: Secondary | ICD-10-CM | POA: Insufficient documentation

## 2021-01-09 DIAGNOSIS — I130 Hypertensive heart and chronic kidney disease with heart failure and stage 1 through stage 4 chronic kidney disease, or unspecified chronic kidney disease: Secondary | ICD-10-CM | POA: Diagnosis present

## 2021-01-09 DIAGNOSIS — G9332 Myalgic encephalomyelitis/chronic fatigue syndrome: Secondary | ICD-10-CM | POA: Insufficient documentation

## 2021-01-09 DIAGNOSIS — Z20822 Contact with and (suspected) exposure to covid-19: Secondary | ICD-10-CM | POA: Diagnosis present

## 2021-01-09 DIAGNOSIS — J189 Pneumonia, unspecified organism: Principal | ICD-10-CM | POA: Diagnosis present

## 2021-01-09 DIAGNOSIS — I152 Hypertension secondary to endocrine disorders: Secondary | ICD-10-CM | POA: Diagnosis present

## 2021-01-09 DIAGNOSIS — E669 Obesity, unspecified: Secondary | ICD-10-CM | POA: Diagnosis present

## 2021-01-09 DIAGNOSIS — A419 Sepsis, unspecified organism: Secondary | ICD-10-CM

## 2021-01-09 DIAGNOSIS — F32A Depression, unspecified: Secondary | ICD-10-CM | POA: Diagnosis present

## 2021-01-09 DIAGNOSIS — E1122 Type 2 diabetes mellitus with diabetic chronic kidney disease: Secondary | ICD-10-CM | POA: Diagnosis present

## 2021-01-09 LAB — COMPREHENSIVE METABOLIC PANEL
ALT: 27 U/L (ref 0–44)
AST: 20 U/L (ref 15–41)
Albumin: 4 g/dL (ref 3.5–5.0)
Alkaline Phosphatase: 103 U/L (ref 38–126)
Anion gap: 14 (ref 5–15)
BUN: 17 mg/dL (ref 8–23)
CO2: 23 mmol/L (ref 22–32)
Calcium: 8.5 mg/dL — ABNORMAL LOW (ref 8.9–10.3)
Chloride: 98 mmol/L (ref 98–111)
Creatinine, Ser: 0.91 mg/dL (ref 0.44–1.00)
GFR, Estimated: >60 mL/min (ref >60)
Glucose, Bld: 207 mg/dL — ABNORMAL HIGH (ref 70–99)
Potassium: 3 mmol/L — ABNORMAL LOW (ref 3.5–5.1)
Sodium: 135 mmol/L (ref 135–145)
Total Bilirubin: 1.1 mg/dL (ref 0.3–1.2)
Total Protein: 7.4 g/dL (ref 6.5–8.1)

## 2021-01-09 LAB — URINALYSIS, ROUTINE W REFLEX MICROSCOPIC
Bilirubin Urine: NEGATIVE
Glucose, UA: NEGATIVE mg/dL
Ketones, ur: NEGATIVE mg/dL
Nitrite: NEGATIVE
Protein, ur: 30 mg/dL — AB
Specific Gravity, Urine: 1.013 (ref 1.005–1.030)
WBC, UA: >50 WBC/hpf — ABNORMAL HIGH (ref 0–5)
pH: 6 (ref 5.0–8.0)

## 2021-01-09 LAB — CBC
HCT: 50.5 % — ABNORMAL HIGH (ref 36.0–46.0)
HCT: 45.1 % (ref 36.0–46.0)
Hemoglobin: 16.8 g/dL — ABNORMAL HIGH (ref 12.0–15.0)
Hemoglobin: 15.1 g/dL — ABNORMAL HIGH (ref 12.0–15.0)
MCH: 32.1 pg (ref 26.0–34.0)
MCH: 31.9 pg (ref 26.0–34.0)
MCHC: 33.3 g/dL (ref 30.0–36.0)
MCHC: 33.5 g/dL (ref 30.0–36.0)
MCV: 96.6 fL (ref 80.0–100.0)
MCV: 95.3 fL (ref 80.0–100.0)
Platelets: 464 10*3/uL — ABNORMAL HIGH (ref 150–400)
Platelets: 465 10*3/uL — ABNORMAL HIGH (ref 150–400)
RBC: 5.23 MIL/uL — ABNORMAL HIGH (ref 3.87–5.11)
RBC: 4.73 MIL/uL (ref 3.87–5.11)
RDW: 15 % (ref 11.5–15.5)
RDW: 14.9 % (ref 11.5–15.5)
WBC: 18.5 10*3/uL — ABNORMAL HIGH (ref 4.0–10.5)
WBC: 18.7 10*3/uL — ABNORMAL HIGH (ref 4.0–10.5)
nRBC: 0 % (ref 0.0–0.2)
nRBC: 0 % (ref 0.0–0.2)

## 2021-01-09 LAB — GLUCOSE, CAPILLARY
Glucose-Capillary: 156 mg/dL — ABNORMAL HIGH (ref 70–99)
Glucose-Capillary: 121 mg/dL — ABNORMAL HIGH (ref 70–99)

## 2021-01-09 LAB — DIFFERENTIAL
Abs Immature Granulocytes: 0.11 10*3/uL — ABNORMAL HIGH (ref 0.00–0.07)
Basophils Absolute: 0.1 10*3/uL (ref 0.0–0.1)
Basophils Relative: 0 %
Eosinophils Absolute: 0.3 10*3/uL (ref 0.0–0.5)
Eosinophils Relative: 2 %
Immature Granulocytes: 1 %
Lymphocytes Relative: 2 %
Lymphs Abs: 0.3 10*3/uL — ABNORMAL LOW (ref 0.7–4.0)
Monocytes Absolute: 1.2 10*3/uL — ABNORMAL HIGH (ref 0.1–1.0)
Monocytes Relative: 6 %
Neutro Abs: 16.5 10*3/uL — ABNORMAL HIGH (ref 1.7–7.7)
Neutrophils Relative %: 89 %

## 2021-01-09 LAB — TROPONIN I (HIGH SENSITIVITY): Troponin I (High Sensitivity): 4 ng/L (ref <18)

## 2021-01-09 LAB — RAPID URINE DRUG SCREEN, HOSP PERFORMED
Amphetamines: NOT DETECTED
Barbiturates: NOT DETECTED
Benzodiazepines: NOT DETECTED
Cocaine: NOT DETECTED
Opiates: NOT DETECTED
Tetrahydrocannabinol: NOT DETECTED

## 2021-01-09 LAB — APTT: aPTT: 35 seconds (ref 24–36)

## 2021-01-09 LAB — BRAIN NATRIURETIC PEPTIDE: B Natriuretic Peptide: 57 pg/mL (ref 0.0–100.0)

## 2021-01-09 LAB — RESP PANEL BY RT-PCR (FLU A&B, COVID) ARPGX2
Influenza A by PCR: NEGATIVE
Influenza B by PCR: NEGATIVE
SARS Coronavirus 2 by RT PCR: NEGATIVE

## 2021-01-09 LAB — PROTIME-INR
INR: 1.1 (ref 0.8–1.2)
Prothrombin Time: 14.3 seconds (ref 11.4–15.2)

## 2021-01-09 LAB — LACTIC ACID, PLASMA: Lactic Acid, Venous: 1.9 mmol/L (ref 0.5–1.9)

## 2021-01-09 LAB — HEMOGLOBIN A1C
Hgb A1c MFr Bld: 6.6 % — ABNORMAL HIGH (ref 4.8–5.6)
Mean Plasma Glucose: 142.72 mg/dL

## 2021-01-09 LAB — CREATININE, SERUM
Creatinine, Ser: 0.99 mg/dL (ref 0.44–1.00)
GFR, Estimated: 55 mL/min — ABNORMAL LOW (ref >60)

## 2021-01-09 LAB — ETHANOL: Alcohol, Ethyl (B): <10 mg/dL (ref <10)

## 2021-01-09 MED ORDER — POTASSIUM CHLORIDE 20 MEQ PO PACK
20.0000 meq | PACK | Freq: Every day | ORAL | Status: DC
  Administered 2021-01-10: 20 meq via ORAL
  Filled 2021-01-09: qty 1

## 2021-01-09 MED ORDER — ONDANSETRON HCL 4 MG/2ML IJ SOLN: 4.0000 mg | Freq: Four times a day (QID) | INTRAMUSCULAR | Status: DC | PRN

## 2021-01-09 MED ORDER — SODIUM CHLORIDE 0.9 % IV SOLN
1.0000 g | INTRAVENOUS | Status: DC
  Administered 2021-01-10: 1 g via INTRAVENOUS
  Filled 2021-01-09 (×2): qty 10

## 2021-01-09 MED ORDER — SODIUM CHLORIDE 0.9 % IV SOLN
500.0000 mg | Freq: Once | INTRAVENOUS | Status: AC
  Administered 2021-01-09: 500 mg via INTRAVENOUS
  Filled 2021-01-09: qty 500

## 2021-01-09 MED ORDER — INSULIN ASPART 100 UNIT/ML IJ SOLN
0.0000 [IU] | Freq: Three times a day (TID) | INTRAMUSCULAR | Status: DC
  Administered 2021-01-09: 3 [IU] via SUBCUTANEOUS
  Administered 2021-01-10 – 2021-01-11 (×3): 2 [IU] via SUBCUTANEOUS

## 2021-01-09 MED ORDER — CARVEDILOL 6.25 MG PO TABS
6.2500 mg | ORAL_TABLET | Freq: Two times a day (BID) | ORAL | Status: DC
  Administered 2021-01-09 – 2021-01-13 (×9): 6.25 mg via ORAL
  Filled 2021-01-09 (×9): qty 1

## 2021-01-09 MED ORDER — ENOXAPARIN SODIUM 40 MG/0.4ML IJ SOSY
40.0000 mg | PREFILLED_SYRINGE | INTRAMUSCULAR | Status: DC
  Administered 2021-01-09 – 2021-01-12 (×4): 40 mg via SUBCUTANEOUS
  Filled 2021-01-09 (×4): qty 0.4

## 2021-01-09 MED ORDER — DOXYCYCLINE HYCLATE 100 MG PO TABS
100.0000 mg | ORAL_TABLET | Freq: Two times a day (BID) | ORAL | Status: DC
  Administered 2021-01-10 – 2021-01-11 (×3): 100 mg via ORAL
  Filled 2021-01-09 (×3): qty 1

## 2021-01-09 MED ORDER — FUROSEMIDE 40 MG PO TABS
40.0000 mg | ORAL_TABLET | Freq: Two times a day (BID) | ORAL | Status: DC
  Administered 2021-01-10: 40 mg via ORAL
  Filled 2021-01-09: qty 1

## 2021-01-09 MED ORDER — DOXYCYCLINE HYCLATE 100 MG PO TABS: 100.0000 mg | ORAL_TABLET | Freq: Two times a day (BID) | ORAL | Status: DC

## 2021-01-09 MED ORDER — HYDRALAZINE HCL 20 MG/ML IJ SOLN
10.0000 mg | Freq: Four times a day (QID) | INTRAMUSCULAR | Status: DC | PRN
Start: 2021-01-09 — End: 2021-01-13

## 2021-01-09 MED ORDER — ACETAMINOPHEN 325 MG PO TABS
650.0000 mg | ORAL_TABLET | Freq: Once | ORAL | Status: AC
  Administered 2021-01-09: 650 mg via ORAL
  Filled 2021-01-09: qty 2

## 2021-01-09 MED ORDER — SIMVASTATIN 20 MG PO TABS
20.0000 mg | ORAL_TABLET | Freq: Every day | ORAL | Status: DC
  Administered 2021-01-09 – 2021-01-13 (×5): 20 mg via ORAL
  Filled 2021-01-09 (×5): qty 1

## 2021-01-09 MED ORDER — CLONAZEPAM 0.125 MG PO TBDP
0.2500 mg | ORAL_TABLET | Freq: Two times a day (BID) | ORAL | Status: DC | PRN
  Administered 2021-01-12: 0.25 mg via ORAL
  Filled 2021-01-09: qty 2

## 2021-01-09 MED ORDER — POTASSIUM CHLORIDE IN NACL 20-0.9 MEQ/L-% IV SOLN: INTRAVENOUS | Status: DC

## 2021-01-09 MED ORDER — SODIUM CHLORIDE 0.9 % IV SOLN
1.0000 g | Freq: Once | INTRAVENOUS | Status: AC
  Administered 2021-01-09: 1 g via INTRAVENOUS
  Filled 2021-01-09: qty 10

## 2021-01-09 MED ORDER — LEVOTHYROXINE SODIUM 112 MCG PO TABS
112.0000 ug | ORAL_TABLET | Freq: Every day | ORAL | Status: DC
  Administered 2021-01-10 – 2021-01-13 (×4): 112 ug via ORAL
  Filled 2021-01-09 (×4): qty 1

## 2021-01-09 MED ORDER — SODIUM CHLORIDE 0.9 % IV BOLUS
500.0000 mL | Freq: Once | INTRAVENOUS | Status: AC
  Administered 2021-01-09: 500 mL via INTRAVENOUS

## 2021-01-09 MED ORDER — ONDANSETRON HCL 4 MG PO TABS: 4.0000 mg | ORAL_TABLET | Freq: Four times a day (QID) | ORAL | Status: DC | PRN

## 2021-01-09 MED ORDER — GUAIFENESIN 100 MG/5ML PO SOLN: 5.0000 mL | ORAL | Status: DC | PRN

## 2021-01-09 MED ORDER — POTASSIUM CHLORIDE CRYS ER 20 MEQ PO TBCR
40.0000 meq | EXTENDED_RELEASE_TABLET | Freq: Once | ORAL | Status: AC
  Administered 2021-01-09: 40 meq via ORAL
  Filled 2021-01-09: qty 2

## 2021-01-09 MED ORDER — ACETAMINOPHEN 500 MG PO TABS
1000.0000 mg | ORAL_TABLET | Freq: Four times a day (QID) | ORAL | Status: DC | PRN
  Administered 2021-01-12: 1000 mg via ORAL
  Filled 2021-01-09: qty 2

## 2021-01-09 MED ORDER — SODIUM CHLORIDE 0.9 % IV SOLN
100.0000 mL/h | INTRAVENOUS | Status: DC
  Administered 2021-01-09: 100 mL/h via INTRAVENOUS

## 2021-01-09 MED ORDER — ALBUTEROL SULFATE (2.5 MG/3ML) 0.083% IN NEBU: 2.5000 mg | INHALATION_SOLUTION | RESPIRATORY_TRACT | Status: DC | PRN

## 2021-01-09 NOTE — ED Notes (Signed)
Transport called to take patient upstairs 

## 2021-01-09 NOTE — ED Notes (Signed)
Pt became SHOB and O2 dropped to 88% during swallow screen due to slow breathing while swallowing. Placed on 2L [place].

## 2021-01-09 NOTE — ED Notes (Addendum)
Pt was able to slowly but continuously swallow 3oz of water without choking, coughing, or gagging.

## 2021-01-09 NOTE — ED Notes (Signed)
Hospitalist at bedside 

## 2021-01-09 NOTE — ED Triage Notes (Signed)
Pt BIB EMS from home for generalized weakness. Ambulatory with assistance. Lungs clear. Hx UTI. A&Ox4 but son reports changes in daily habits. New nitrofurantoin rx, 4th in as many months. Pt reports epigastric pain. EMS notes dark urine. 20GA LAC.  BP 158/103 ST @ 100, down to 90 after 250 NS, 600NS total EtCo2 28 RR 40 CBG 263 SpO2 93% 3L

## 2021-01-09 NOTE — H&P (Addendum)
History and Physical    DOA: 01/09/2021  PCP: Sigmund Hazel, MD  Patient coming from: home  Chief Complaint: weakness  HPI: Sue [name] is a 87 y.o. female with history h/o CHF, diabetes mellitus, hyperlipidemia, hypothyroidism who was a remote smoker presents today with complaints of generalized weakness.  Patient's son lives with her and apparently was concerned regarding progressive weakness where she could not walk with her walker or get up from the commode without help.  Patient was evaluated by her PCP couple of days back and prescribed Macrobid for presumed UTI but patient denies any history of dysuria.  She also denies any fevers or cough or dyspnea at home.  She refers to a surgical procedure (unspecified) that she underwent few months back after which she feels she has overall declined.  Patient currently appears tachypneic while talking full sentences but denies any cough.  She appears overall weak but is oriented x3. ED course: Noted to be febrile at 102F, tachypneic on arrival with respiratory rate in the 40s-improved to 30s -->now 20s.  Was noted to be saturating low 90s on arrival but did drop to 88% while in ED requiring 2 L nasal cannula.  Her heart rate has fluctuated between 86-122.  Labs show WBC 18 K, creatinine 0.9.  Potassium 3.0.  COVID-negative.  BNP 57, HS troponin 4,  lactate 1.9.  CT head unremarkable.  Chest x-ray showed left lower lobe patchy infiltrate suggestive of pneumonia. Patient received 500 mm IV fluid bolus with maintenance 100 mL/h, IV Rocephin, azithromycin, p.o. KCl 40 mEq in the ED.  UA still pending.  Admission to medical service requested for further evaluation and management.  Review of Systems: As per HPI, otherwise review of systems negative.    Past Medical History:  Diagnosis Date  . CHF (congestive heart failure) (HCC)   . Diabetes mellitus without complication (HCC)   . Hyperlipidemia   . Thyroid disease     History reviewed. No  pertinent surgical history.  Social history:  reports that she quit smoking about 62 years ago. Her smoking use included cigarettes. She has never used smokeless tobacco. She reports that she does not drink alcohol and does not use drugs.   No Known Allergies  No family history on file.    Prior to Admission medications   Medication Sig Start Date End Date Taking? Authorizing Provider  acetaminophen (TYLENOL) 500 MG tablet Take 1,000 mg by mouth every 6 (six) hours as needed for mild pain.   Yes [provider]  carvedilol (COREG) 3.125 MG tablet TAKE TWO TABLETS BY MOUTH TWICE A DAY Patient taking differently: 3.125 mg 2 (two) times daily with a meal. 12/13/18  Yes Jodelle Red, MD  clonazePAM (KLONOPIN) 0.5 MG tablet Take 0.25-0.5 mg by mouth 2 (two) times daily as needed for anxiety.   Yes Marden Noble, MD  furosemide (LASIX) 20 MG tablet Take 40 mg by mouth 2 (two) times daily.   Yes Marden Noble, MD  nitrofurantoin, macrocrystal-monohydrate, (MACROBID) 100 MG capsule Take 100 mg by mouth 2 (two) times daily. 01/06/21  Yes [provider]  potassium chloride (K-DUR,KLOR-CON) 10 MEQ tablet Take 10 mEq by mouth 2 (two) times daily.    Yes Marden Noble, MD  simvastatin (ZOCOR) 20 MG tablet Take 20 mg by mouth daily.   Yes Marden Noble, MD  levothyroxine (SYNTHROID, LEVOTHROID) 112 MCG tablet Take 112 mcg by mouth daily before breakfast. 09/05/18   [provider]  Physical Exam: Vitals:   01/09/21 1245 01/09/21 1300 01/09/21 1315 01/09/21 1330  BP: 120/85 (!) 128/91 135/71 (!) 154/70  Pulse: 84 73 92 91  Resp: (!) 33 (!) 33 (!) 22 (!) 29  Temp:      TempSrc:      SpO2: 96% 96% 96% 94%  Weight:      Height:        Constitutional: Elderly female able to lay flat in stretcher bed, appears overall weak, on 2 L nasal cannulae appears dyspneic while talking Eyes: PERRL, lids and conjunctivae normal ENMT: Mucous membranes are moist. Posterior  pharynx clear of any exudate or lesions.Normal dentition.  Neck: normal, supple, no masses, no thyromegaly Respiratory: clear to auscultation bilaterally, no wheezing, no crackles.  Tachypneic with increased respiratory effort while talking.  Cardiovascular: Regular rate and rhythm, no murmurs / rubs / gallops. No extremity edema. 2+ pedal pulses. No carotid bruits.  Abdomen: no tenderness, no masses palpated. No hepatosplenomegaly. Bowel sounds positive.  Musculoskeletal: No joint deformity upper and lower extremities. Good ROM, no contractures. Normal muscle tone.  Neurologic: CN 2-12 grossly intact. Sensation intact, DTR normal. Strength 4/5 in all 4.  Psychiatric: Normal judgment and insight. Alert and oriented x 3. Normal mood.  SKIN/catheters: no rashes, lesions, ulcers.  Prominent veins noted on lower extremities  Labs on Admission: I have personally reviewed following labs and imaging studies  CBC: Recent Labs  Lab 01/09/21 1027  WBC 18.5*  NEUTROABS 16.5*  HGB 16.8*  HCT 50.5*  MCV 96.6  PLT 464*   Basic Metabolic Panel: Recent Labs  Lab 01/09/21 1027  NA 135  K 3.0*  CL 98  CO2 23  GLUCOSE 207*  BUN 17  CREATININE 0.91  CALCIUM 8.5*   GFR: Estimated Creatinine Clearance: 45.7 mL/min (by C-G formula based on SCr of 0.91 mg/dL). Recent Labs  Lab 01/09/21 1027 01/09/21 1216  WBC 18.5*  --   LATICACIDVEN  --  1.9   Liver Function Tests: Recent Labs  Lab 01/09/21 1027  AST 20  ALT 27  ALKPHOS 103  BILITOT 1.1  PROT 7.4  ALBUMIN 4.0   No results for input(s): LIPASE, AMYLASE in the last 168 hours. No results for input(s): AMMONIA in the last 168 hours. Coagulation Profile: Recent Labs  Lab 01/09/21 1027  INR 1.1   Cardiac Enzymes: No results for input(s): CKTOTAL, CKMB, CKMBINDEX, TROPONINI in the last 168 hours. BNP (last 3 results) No results for input(s): PROBNP in the last 8760 hours. HbA1C: No results for input(s): HGBA1C in the last 72  hours. CBG: No results for input(s): GLUCAP in the last 168 hours. Lipid Profile: No results for input(s): CHOL, HDL, LDLCALC, TRIG, CHOLHDL, LDLDIRECT in the last 72 hours. Thyroid Function Tests: No results for input(s): TSH, T4TOTAL, FREET4, T3FREE, THYROIDAB in the last 72 hours. Anemia Panel: No results for input(s): VITAMINB12, FOLATE, FERRITIN, TIBC, IRON, RETICCTPCT in the last 72 hours. Urine analysis:    Component Value Date/Time   COLORURINE YELLOW 01/09/2021 1410   APPEARANCEUR CLOUDY (A) 01/09/2021 1410   LABSPEC 1.013 01/09/2021 1410   PHURINE 6.0 01/09/2021 1410   GLUCOSEU NEGATIVE 01/09/2021 1410   HGBUR SMALL (A) 01/09/2021 1410   BILIRUBINUR NEGATIVE 01/09/2021 1410   KETONESUR NEGATIVE 01/09/2021 1410   PROTEINUR 30 (A) 01/09/2021 1410   NITRITE NEGATIVE 01/09/2021 1410   LEUKOCYTESUR LARGE (A) 01/09/2021 1410    Radiological Exams on Admission: Personally reviewed  CT HEAD WO CONTRAST  Result Date: 01/09/2021 CLINICAL DATA:  Shortness of breath, weakness, focal neurological deficit for more than 6 hours suspected stroke, history CHF, diabetes mellitus EXAM: CT HEAD WITHOUT CONTRAST TECHNIQUE: Contiguous axial images were obtained from the base of the skull through the vertex without intravenous contrast. Sagittal and coronal MPR images reconstructed from axial data set. COMPARISON:  None FINDINGS: Brain: Generalized atrophy. Normal ventricular morphology. No midline shift or mass effect. Otherwise normal appearance of brain parenchyma. No intracranial hemorrhage, mass lesion, or evidence of acute infarction. No extra-axial fluid collections. Vascular: No hyperdense vessels. Minimally prominent basilar tip 5 mm diameter. Atherosclerotic calcifications of internal carotid arteries at skull base Skull: Demineralized Sinuses/Orbits: Clear Other: N/A IMPRESSION: Atrophy with small vessel chronic ischemic changes of deep cerebral white matter. No acute intracranial  abnormalities. Minimally prominent tip of basilar artery measuring 5 mm diameter. Electronically Signed   By: Ulyses SouthwardMark  Boles M.D.   On: 01/09/2021 12:46   DG Chest Portable 1 View  Result Date: 01/09/2021 CLINICAL DATA:  Weakness, shortness of breath EXAM: PORTABLE CHEST 1 VIEW COMPARISON:  06/02/2018 FINDINGS: Patchy left lower lobe opacity, suspicious for pneumonia. Mild patchy right lower lobe opacity, atelectasis versus pneumonia. No definite pleural effusions. No frank interstitial edema. No pneumothorax. The heart is normal in size.  Mild thoracic aortic atherosclerosis. IMPRESSION: Patchy left lower lobe opacity, suspicious for pneumonia. Mild patchy right lower lobe opacity, atelectasis versus pneumonia. Electronically Signed   By: Charline BillsSriyesh  Krishnan M.D.   On: 01/09/2021 10:46    EKG: Independently reviewed.  Rhythm with RBBB, QTC 469 ms     Assessment and Plan:   Active Problems:   Pneumonia    1.  Pneumonia, community-acquired with acute hypoxic respiratory failure: Present on admission.  Taper O2 as tolerated.  Patient meets sepsis criteria with tachypnea, tachycardia, hypoxia, leukocytosis on labs and chest x-ray showing left lower lobe pneumonia.  Blood pressure stable.  Received IV fluids and empiric antibiotics in the ED.  COVID/flu negative.  Given history of CHF, will hold off on further hydration, resume home diuretics from a.m.  2.  Chronic combined systolic/diastolic CHF: Appears compensated, no signs of leg edema, BNP normal.  Resume Coreg.  Last echo in 2019 showed EF 40 to 45% and stage I diastolic dysfunction.  Not sure why patient is not on ACE inhibitor.  Outside records indicate diagnosis of CKD stage IIIa but renal function normal currently.Resume home diuretics in AM.  3.  Hypertension: Resume Coreg  4.  Hypothyroidism: Resume Synthroid  5.  Hypokalemia: Likely in the setting of diuretic use, also on potassium supplements at home.  Received 40 mEq p.o. in the ED.   Resume p.o. potassium along with home Lasix in AM.   6.  Diabetes mellitus:?  Diet controlled.  Not on any home meds.  Check hemoglobin A1c.  Blood glucose on BMP was 207.  Will place on sliding scale insulin.  7. Depression/anxiety: Resume home medications  8.  Generalized weakness: Likely in the setting of problem #1 and hypoxia.?  UTI-follow-up urine cultures as patient denies any dysuria.  Hold Macrobid for now.  Likely component of dehydration as well.  Received fluids in the ED today.  Possibly worth checking orthostatics in AM.  PT/OT eval.  Lives at home with son.  DVT prophylaxis: Lovenox  COVID screen: Negative  Code Status: Full code as confirmed with patient.Health care proxy would be son  Patient/Family Communication: Discussed with patient and all questions answered to satisfaction.  Consults called: None Admission status :I certify that at the point of admission it is my clinical judgment that the patient will require inpatient hospital care spanning beyond 2 midnights from the point of admission due to high intensity of service and high frequency of surveillance required.Inpatient status is judged to be reasonable and necessary in order to provide the required intensity of service to ensure the patient's safety. The patient's presenting symptoms, physical exam findings, and initial radiographic and laboratory data in the context of their chronic comorbidities is felt to place them at high risk for further clinical deterioration. The following factors support the patient status of inpatient : Ammonia with hypoxia and sepsis criteria requiring IV antibiotics/supplemental O2.     Alessandra Bevels MD [facility] Pager in [place]  If 7PM-7AM, please contact night-coverage [email or web address]   01/09/2021, 3:04 PM

## 2021-01-09 NOTE — ED Notes (Signed)
Unsuccessful attempt at blood culture collection.

## 2021-01-09 NOTE — ED Notes (Signed)
Patient's son, Cleone Slim, [phone].

## 2021-01-09 NOTE — ED Notes (Signed)
Son at bedside.

## 2021-01-09 NOTE — ED Notes (Signed)
Patient provided with gingerale.

## 2021-01-09 NOTE — ED Notes (Signed)
Patient remains on bedpan per request. Purewick placed additionally in attempt to collect urine sample.

## 2021-01-09 NOTE — ED Provider Notes (Signed)
[facility]-EMERGENCY DEPT Provider Note   CSN: [id number] Arrival date & time: 01/09/21  0920     History Chief Complaint  Patient presents with  . Weakness    Sue [name] is a 87 y.o. female.  HPI   Patient presents to the ED with complaints of weakness.  Patient states normally she is able to get around using a walker at home.  This morning when she tried to get up she was feeling very weak and unable to do so.  Patient feels weak all over but she does feel like it is more on the left side.  Patient also feels like her mouth is very dry.  She is thirsty.  She denies any fevers.  No vomiting or diarrhea.  She is having some urinary discomfort and recently was diagnosed with a urinary tract infection and started on antibiotics.  She denies any vomiting or diarrhea.  No headache.  No difficulty breathing.  Past Medical History:  Diagnosis Date  . CHF (congestive heart failure) (HCC)   . Diabetes mellitus without complication (HCC)   . Hyperlipidemia   . Thyroid disease     Patient Active Problem List   Diagnosis Date Noted  . Diabetes (HCC) 04/14/2020    History reviewed. No pertinent surgical history.   OB History   No obstetric history on file.     No family history on file.  Social History   Tobacco Use  . Smoking status: Former Smoker    Types: Cigarettes    Quit date: 09/06/1958    Years since quitting: 62.3  . Smokeless tobacco: Never Used  Vaping Use  . Vaping Use: Never used  Substance Use Topics  . Alcohol use: No  . Drug use: No    Home Medications Prior to Admission medications   Medication Sig Start Date End Date Taking? Authorizing Provider  acetaminophen (TYLENOL) 500 MG tablet Take 1,000 mg by mouth every 6 (six) hours as needed for mild pain.   Yes [provider]  carvedilol (COREG) 3.125 MG tablet TAKE TWO TABLETS BY MOUTH TWICE A DAY Patient taking differently: 3.125 mg 2 (two) times daily with a meal.  12/13/18  Yes Jodelle Red, MD  clonazePAM (KLONOPIN) 0.5 MG tablet Take 0.25-0.5 mg by mouth 2 (two) times daily as needed for anxiety.   Yes Marden Noble, MD  furosemide (LASIX) 20 MG tablet Take 40 mg by mouth 2 (two) times daily.   Yes Marden Noble, MD  nitrofurantoin, macrocrystal-monohydrate, (MACROBID) 100 MG capsule Take 100 mg by mouth 2 (two) times daily. 01/06/21  Yes [provider]  potassium chloride (K-DUR,KLOR-CON) 10 MEQ tablet Take 10 mEq by mouth 2 (two) times daily.    Yes Marden Noble, MD  simvastatin (ZOCOR) 20 MG tablet Take 20 mg by mouth daily.   Yes Marden Noble, MD  levothyroxine (SYNTHROID, LEVOTHROID) 112 MCG tablet Take 112 mcg by mouth daily before breakfast. 09/05/18   [provider]    Allergies    Patient has no known allergies.  Review of Systems   Review of Systems  Physical Exam Updated Vital Signs BP 135/71   Pulse 92   Temp (!) 102 F (38.9 C) (Rectal)   Resp (!) 22   Ht 1.626 m (5\' 4" )   Wt 90.7 kg   SpO2 96%   BMI 34.33 kg/m   Physical Exam Vitals and nursing note reviewed.  Constitutional:      Appearance: She is  well-developed. She is ill-appearing.     Comments: Appears listless  HENT:     Head: Normocephalic and atraumatic.     Right Ear: External ear normal.     Left Ear: External ear normal.     Mouth/Throat:     Mouth: Mucous membranes are dry.  Eyes:     General: No scleral icterus.       Right eye: No discharge.        Left eye: No discharge.     Conjunctiva/sclera: Conjunctivae normal.  Neck:     Trachea: No tracheal deviation.  Cardiovascular:     Rate and Rhythm: Normal rate and regular rhythm.  Pulmonary:     Effort: Pulmonary effort is normal. No respiratory distress.     Breath sounds: Normal breath sounds. No stridor. No wheezing or rales.  Abdominal:     General: Bowel sounds are normal. There is no distension.     Palpations: Abdomen is soft.     Tenderness: There is no  abdominal tenderness. There is no guarding or rebound.  Musculoskeletal:        General: No tenderness.     Cervical back: Neck supple.     Right lower leg: No edema.     Left lower leg: No edema.  Skin:    General: Skin is warm and dry.     Findings: No rash.  Neurological:     Mental Status: She is alert.     Cranial Nerves: No cranial nerve deficit (no facial droop, extraocular movements intact, no slurred speech).     Sensory: No sensory deficit.     Motor: Weakness present. No tremor, abnormal muscle tone or seizure activity.     Comments: Weakness bilateral upper extremities and lower extremities, appears to have more difficulty with her left leg, had difficulty holding it off the bed more so than the right leg     ED Results / Procedures / Treatments   Labs (all labs ordered are listed, but only abnormal results are displayed) Labs Reviewed  CBC - Abnormal; Notable for the following components:      Result Value   WBC 18.5 (*)    RBC 5.23 (*)    Hemoglobin 16.8 (*)    HCT 50.5 (*)    Platelets 464 (*)    All other components within normal limits  DIFFERENTIAL - Abnormal; Notable for the following components:   Neutro Abs 16.5 (*)    Lymphs Abs 0.3 (*)    Monocytes Absolute 1.2 (*)    Abs Immature Granulocytes 0.11 (*)    All other components within normal limits  COMPREHENSIVE METABOLIC PANEL - Abnormal; Notable for the following components:   Potassium 3.0 (*)    Glucose, Bld 207 (*)    Calcium 8.5 (*)    All other components within normal limits  RESP PANEL BY RT-PCR (FLU A&B, COVID) ARPGX2  CULTURE, BLOOD (ROUTINE X 2)  CULTURE, BLOOD (ROUTINE X 2)  ETHANOL  PROTIME-INR  APTT  BRAIN NATRIURETIC PEPTIDE  LACTIC ACID, PLASMA  RAPID URINE DRUG SCREEN, HOSP PERFORMED  URINALYSIS, ROUTINE W REFLEX MICROSCOPIC  LACTIC ACID, PLASMA  TROPONIN I (HIGH SENSITIVITY)    EKG EKG Interpretation  Date/Time:  Friday Jan 09 2021 10:24:30 EDT Ventricular Rate:   84 PR Interval:  138 QRS Duration: 156 QT Interval:  396 QTC Calculation: 469 R Axis:   7 Text Interpretation: Sinus rhythm Atrial premature complexes Right bundle branch block , new  since last tracing Confirmed by Linwood Dibbles [phone]) on 01/09/2021 10:29:07 AM   Radiology CT HEAD WO CONTRAST  Result Date: 01/09/2021 CLINICAL DATA:  Shortness of breath, weakness, focal neurological deficit for more than 6 hours suspected stroke, history CHF, diabetes mellitus EXAM: CT HEAD WITHOUT CONTRAST TECHNIQUE: Contiguous axial images were obtained from the base of the skull through the vertex without intravenous contrast. Sagittal and coronal MPR images reconstructed from axial data set. COMPARISON:  None FINDINGS: Brain: Generalized atrophy. Normal ventricular morphology. No midline shift or mass effect. Otherwise normal appearance of brain parenchyma. No intracranial hemorrhage, mass lesion, or evidence of acute infarction. No extra-axial fluid collections. Vascular: No hyperdense vessels. Minimally prominent basilar tip 5 mm diameter. Atherosclerotic calcifications of internal carotid arteries at skull base Skull: Demineralized Sinuses/Orbits: Clear Other: N/A IMPRESSION: Atrophy with small vessel chronic ischemic changes of deep cerebral white matter. No acute intracranial abnormalities. Minimally prominent tip of basilar artery measuring 5 mm diameter. Electronically Signed   By: Ulyses Southward M.D.   On: 01/09/2021 12:46   DG Chest Portable 1 View  Result Date: 01/09/2021 CLINICAL DATA:  Weakness, shortness of breath EXAM: PORTABLE CHEST 1 VIEW COMPARISON:  06/02/2018 FINDINGS: Patchy left lower lobe opacity, suspicious for pneumonia. Mild patchy right lower lobe opacity, atelectasis versus pneumonia. No definite pleural effusions. No frank interstitial edema. No pneumothorax. The heart is normal in size.  Mild thoracic aortic atherosclerosis. IMPRESSION: Patchy left lower lobe opacity, suspicious for pneumonia.  Mild patchy right lower lobe opacity, atelectasis versus pneumonia. Electronically Signed   By: Charline Bills M.D.   On: 01/09/2021 10:46    Procedures .Critical Care Performed by: Linwood Dibbles, MD Authorized by: Linwood Dibbles, MD   Critical care provider statement:    Critical care time (minutes):  30   Critical care was time spent personally by me on the following activities:  Discussions with consultants, evaluation of patient's response to treatment, examination of patient, ordering and performing treatments and interventions, ordering and review of laboratory studies, ordering and review of radiographic studies, pulse oximetry, re-evaluation of patient's condition, obtaining history from patient or surrogate and review of old charts     Medications Ordered in ED Medications  sodium chloride 0.9 % bolus 500 mL (0 mLs Intravenous Stopped 01/09/21 1210)    Followed by  0.9 %  sodium chloride infusion (100 mL/hr Intravenous New Bag/Given 01/09/21 1035)  azithromycin (ZITHROMAX) 500 mg in sodium chloride 0.9 % 250 mL IVPB (500 mg Intravenous New Bag/Given 01/09/21 1333)  cefTRIAXone (ROCEPHIN) 1 g in sodium chloride 0.9 % 100 mL IVPB (0 g Intravenous Stopped 01/09/21 1320)  acetaminophen (TYLENOL) tablet 650 mg (650 mg Oral Given 01/09/21 1332)    ED Course  I have reviewed the triage vital signs and the nursing notes.  Pertinent labs & imaging results that were available during my care of the patient were reviewed by me and considered in my medical decision making (see chart for details).  Clinical Course as of 01/09/21 1342  Fri Jan 09, 2021  1138 Covid test is negative. [JK]  1139 White blood cell count is notable for significant leukocytosis [JK]  1139 Electrolyte panel notable for mild hypokalemia [JK]  1139 Chest x-ray concerning for the possibility of pneumonia.  Patient has been noted to have dyspnea.  She was placed back on oxygen [JK]  1140 Patient noted to have a fever.  Chest  x-ray showing pneumonia we will start her on fluids.  I will  add on lactic acid level and blood cultures [JK]    Clinical Course User Index [JK] Linwood Dibbles, MD   MDM Rules/Calculators/A&P                          Patient presented to the ED with complaints of weakness.  While she was here she did have a fever.  Patient did have elevated white blood cell count.  Laboratory test do not show any signs of severe electrolyte abnormalities.  Patient does not have any lactic acidosis to suggest sepsis.  Her respiratory panel is negative for COVID and influenza.  CT scan was performed because of her weakness but no signs of any acute abnormalities chest x-ray does suggest the possibility of pneumonia.  She does have increased work of breathing and an oxygen requirement.  Patient has also recently been diagnosed with a UTI.  Urinalysis currently pending.  With her degree of fever her age and risk factors I have ordered IV antibiotics.  We will consult the medical service for admission. Final Clinical Impression(s) / ED Diagnoses Final diagnoses:  Community acquired pneumonia, unspecified laterality      Linwood Dibbles, MD 01/09/21 1342

## 2021-01-10 ENCOUNTER — Other Ambulatory Visit: Payer: Self-pay

## 2021-01-10 DIAGNOSIS — I5033 Acute on chronic diastolic (congestive) heart failure: Secondary | ICD-10-CM

## 2021-01-10 DIAGNOSIS — E785 Hyperlipidemia, unspecified: Secondary | ICD-10-CM

## 2021-01-10 DIAGNOSIS — E1142 Type 2 diabetes mellitus with diabetic polyneuropathy: Secondary | ICD-10-CM

## 2021-01-10 DIAGNOSIS — J9601 Acute respiratory failure with hypoxia: Secondary | ICD-10-CM

## 2021-01-10 LAB — BASIC METABOLIC PANEL
Anion gap: 10 (ref 5–15)
BUN: 14 mg/dL (ref 8–23)
CO2: 25 mmol/L (ref 22–32)
Calcium: 8.7 mg/dL — ABNORMAL LOW (ref 8.9–10.3)
Chloride: 102 mmol/L (ref 98–111)
Creatinine, Ser: 0.97 mg/dL (ref 0.44–1.00)
GFR, Estimated: 56 mL/min — ABNORMAL LOW (ref >60)
Glucose, Bld: 127 mg/dL — ABNORMAL HIGH (ref 70–99)
Potassium: 3 mmol/L — ABNORMAL LOW (ref 3.5–5.1)
Sodium: 137 mmol/L (ref 135–145)

## 2021-01-10 LAB — CBC
HCT: 44.1 % (ref 36.0–46.0)
Hemoglobin: 14.4 g/dL (ref 12.0–15.0)
MCH: 31.4 pg (ref 26.0–34.0)
MCHC: 32.7 g/dL (ref 30.0–36.0)
MCV: 96.1 fL (ref 80.0–100.0)
Platelets: 336 10*3/uL (ref 150–400)
RBC: 4.59 MIL/uL (ref 3.87–5.11)
RDW: 15.1 % (ref 11.5–15.5)
WBC: 11.3 10*3/uL — ABNORMAL HIGH (ref 4.0–10.5)
nRBC: 0 % (ref 0.0–0.2)

## 2021-01-10 LAB — GLUCOSE, CAPILLARY
Glucose-Capillary: 148 mg/dL — ABNORMAL HIGH (ref 70–99)
Glucose-Capillary: 125 mg/dL — ABNORMAL HIGH (ref 70–99)
Glucose-Capillary: 130 mg/dL — ABNORMAL HIGH (ref 70–99)
Glucose-Capillary: 148 mg/dL — ABNORMAL HIGH (ref 70–99)
Glucose-Capillary: 115 mg/dL — ABNORMAL HIGH (ref 70–99)
Glucose-Capillary: 157 mg/dL — ABNORMAL HIGH (ref 70–99)

## 2021-01-10 MED ORDER — POTASSIUM CHLORIDE CRYS ER 20 MEQ PO TBCR
40.0000 meq | EXTENDED_RELEASE_TABLET | ORAL | Status: AC
  Administered 2021-01-10: 40 meq via ORAL
  Filled 2021-01-10: qty 2

## 2021-01-10 MED ORDER — FUROSEMIDE 10 MG/ML IJ SOLN
40.0000 mg | Freq: Two times a day (BID) | INTRAMUSCULAR | Status: DC
  Administered 2021-01-10 – 2021-01-11 (×2): 40 mg via INTRAVENOUS
  Filled 2021-01-10 (×2): qty 4

## 2021-01-10 MED ORDER — SALINE SPRAY 0.65 % NA SOLN
1.0000 | NASAL | Status: DC | PRN
  Administered 2021-01-10: 1 via NASAL
  Filled 2021-01-10: qty 44

## 2021-01-10 NOTE — Plan of Care (Signed)
  Problem: Health Behavior/Discharge Planning: Goal: Ability to manage health-related needs will improve Outcome: Progressing   Problem: Education: Goal: Knowledge of General Education information will improve Description: Including pain rating scale, medication(s)/side effects and non-pharmacologic comfort measures Outcome: Not Progressing   

## 2021-01-10 NOTE — Progress Notes (Signed)
PROGRESS NOTE    Sue [name]  EUM:[id number] DOB: 10/20/1929 DOA: 01/09/2021 PCP: Sigmund Hazel, MD    Brief Narrative:  Sue [name] was admitted to the hospital with the working diagnosis of acute hypoxic respiratory failure due to community-acquired pneumonia, acute pulmonary edema.  87 year old female past medical history for heart failure, type II diabetes mellitus, dyslipidemia and hypothyroidism who presents with generalized weakness.  At home patient was noted to have progressive weakness, decreased physical functional capacity, that prompted her to be brought to the hospital.  On her initial physical examination she was febrile 102 F, she was tachypneic, respiratory rate 30-40, oxygen saturation 88%, blood pressure 120/85, heart rate 84.  She had increased work of breathing, no wheezing or rhonchi, heart S1-S2, present, rhythmic, soft abdomen, lower extremity edema.  Sodium 135, potassium 3.0, chloride 98, bicarb 23, glucose 207, BUN 17, creatinine 0.91, white count 18.5, hemoglobin 16.8, hematocrit 50.5, platelets 464. SARS COVID-19 negative. Urinalysis specific gravity 1.013, 11-20 red cells,> 50 white cells.  Head CT with atrophy, small vessel chronic ischemic changes, no acute abnormalities. Chest radiograph with increased vascular interstitial markings bilaterally, bibasilar opacities, retrocardiac on the left.  EKG 84 bpm, normal axis, right bundle branch block, sinus rhythm with PACs, no significant ST segment or T wave changes.  Assessment & Plan:   Principal Problem:   Acute respiratory failure with hypoxemia (HCC) Active Problems:   Diabetes (HCC)   Pneumonia   Essential hypertension   Hyperlipidemia   Hypothyroidism   Acute on chronic diastolic CHF (congestive heart failure) (HCC)   1. Acute hypoxemic respiratory failure due to community acquired pneumonia, bilateral lower lobes.  Patient oxygenating at 98% on 2 L/min per [place].  Wbc is down to 11.3 from 18,7.  Blood cultures with no growth.   Continue antibiotic therapy with ceftriaxone and doxycycline. Chest film with signs of acute pulmonary edema, will change to IV furosemide to keep negative fluid balance.   2. Diastolic heart failure acute on chronic with acute pulmonary edema, blood pressure this am 115 systolic.   Will add one dose of furosemide. Continue blood pressure control with carvedilol.  Change oral furosemide to IV for now.   3. T2DM. Dyslipidemia. Continue glucose control with insulin sliding scale, patient is tolerating po well. Continue with statin therapy.   4. Hypothyroid. Continue with levothyroxine  5. Obesity class 1/ depression. Calculated BMI is 34.3 Continue with clonazepam.   6. Hypokalemia. Serum K continue to be low down to 3,0, had 20 kcl this am, will add 80 meq more in 2 divided doses   Patient continue to be at high risk for worsening respiratory failure   Status is: Inpatient  Remains inpatient appropriate because:IV treatments appropriate due to intensity of illness or inability to take PO   Dispo: The patient is from: Home              Anticipated d/c is to: SNF              Patient currently is not medically stable to d/c.   Difficult to place patient No   DVT prophylaxis: Enoxaparin   Code Status:   full  Family Communication:   I was not able to reach her son over the phone, not able to leave message, will try to contact him at a later time.    Antimicrobials:   Ceftriaxone and doxycycline     Subjective: Patient continue to have dyspnea, no chest pain, no nausea or  vomiting. Continue to be very weak and deconditioned.   Objective: Vitals:   01/09/21 1655 01/09/21 2207 01/09/21 2209 01/10/21 0503  BP: 104/66 138/66 138/66 115/63  Pulse: 74 79 79 62  Resp: 17 16  16   Temp: 97.8 F (36.6 C)   (!) 97.5 F (36.4 C)  TempSrc: Oral   Oral  SpO2: 92% 96%  98%  Weight:      Height:        Intake/Output Summary (Last 24 hours) at  01/10/2021 1327 Last data filed at 01/10/2021 03/12/2021 Gross per 24 hour  Intake 240 ml  Output 26 ml  Net 214 ml   Filed Weights   01/09/21 0938  Weight: 90.7 kg    Examination:   General: Not in pain or dyspnea, deconditioned  Neurology: Awake and alert, non focal  E ENT: no pallor, no icterus, oral mucosa moist Cardiovascular: No JVD. S1-S2 present, rhythmic, no gallops, rubs, or murmurs. Trace non pitting lower extremity edema. Pulmonary:positive breath sounds bilaterally, rales at bases but no wheezing. Gastrointestinal. Abdomen soft and non tender Skin. No rashes Musculoskeletal: no joint deformities     Data Reviewed: I have personally reviewed following labs and imaging studies  CBC: Recent Labs  Lab 01/09/21 1027 01/09/21 1405 01/10/21 0559  WBC 18.5* 18.7* 11.3*  NEUTROABS 16.5*  --   --   HGB 16.8* 15.1* 14.4  HCT 50.5* 45.1 44.1  MCV 96.6 95.3 96.1  PLT 464* 465* 336   Basic Metabolic Panel: Recent Labs  Lab 01/09/21 1027 01/09/21 1405 01/10/21 0559  NA 135  --  137  K 3.0*  --  3.0*  CL 98  --  102  CO2 23  --  25  GLUCOSE 207*  --  127*  BUN 17  --  14  CREATININE 0.91 0.99 0.97  CALCIUM 8.5*  --  8.7*   GFR: Estimated Creatinine Clearance: 42.9 mL/min (by C-G formula based on SCr of 0.97 mg/dL). Liver Function Tests: Recent Labs  Lab 01/09/21 1027  AST 20  ALT 27  ALKPHOS 103  BILITOT 1.1  PROT 7.4  ALBUMIN 4.0   No results for input(s): LIPASE, AMYLASE in the last 168 hours. No results for input(s): AMMONIA in the last 168 hours. Coagulation Profile: Recent Labs  Lab 01/09/21 1027  INR 1.1   Cardiac Enzymes: No results for input(s): CKTOTAL, CKMB, CKMBINDEX, TROPONINI in the last 168 hours. BNP (last 3 results) No results for input(s): PROBNP in the last 8760 hours. HbA1C: Recent Labs    01/09/21 1405  HGBA1C 6.6*   CBG: Recent Labs  Lab 01/09/21 2007 01/10/21 0013 01/10/21 0407 01/10/21 0728 01/10/21 1120  GLUCAP  121* 148* 125* 130* 148*   Lipid Profile: No results for input(s): CHOL, HDL, LDLCALC, TRIG, CHOLHDL, LDLDIRECT in the last 72 hours. Thyroid Function Tests: No results for input(s): TSH, T4TOTAL, FREET4, T3FREE, THYROIDAB in the last 72 hours. Anemia Panel: No results for input(s): VITAMINB12, FOLATE, FERRITIN, TIBC, IRON, RETICCTPCT in the last 72 hours.    Radiology Studies: I have reviewed all of the imaging during this hospital visit personally     Scheduled Meds: . carvedilol  6.25 mg Oral BID  . doxycycline  100 mg Oral Q12H  . enoxaparin (LOVENOX) injection  40 mg Subcutaneous Q24H  . furosemide  40 mg Oral BID  . insulin aspart  0-15 Units Subcutaneous TID WC  . levothyroxine  112 mcg Oral QAC breakfast  . potassium chloride  20 mEq Oral Daily  . simvastatin  20 mg Oral Daily   Continuous Infusions: . cefTRIAXone (ROCEPHIN)  IV 1 g (01/10/21 1122)     LOS: 1 day        Terryon Pineiro Annett Gula, MD

## 2021-01-10 NOTE — Evaluation (Signed)
Clinical/Bedside Swallow Evaluation Patient Details  Name: Sue [name] MRN: [id number] Date of Birth: [birth year]  Today's Date: 01/10/2021 Time: SLP Start Time (ACUTE ONLY): 1710 SLP Stop Time (ACUTE ONLY): 1746 SLP Time Calculation (min) (ACUTE ONLY): 36 min  Past Medical History:  Past Medical History:  Diagnosis Date  . CHF (congestive heart failure) (HCC)   . Diabetes mellitus without complication (HCC)   . Hyperlipidemia   . Thyroid disease    Past Surgical History: History reviewed. No pertinent surgical history. HPI:  87 yo female adm to [facility] with AMS, respiratory deficits - found to have left lobe pna.  Pt PMH + for CHF, DM, HLD, hypothyroidism, remote smoker - many years ago and she has GERD.  Swallow evaluation ordered.   Assessment / Plan / Recommendation Clinical Impression  Pt with functional oropharyngeal swallow ability - no indication of aspiration or dysphagia.  Pt passed 3 ounce Yale water test easily. She denies refluxing.  Pt swallow clinically appeared timely with clear voice throughout.  Adequate mastication of solid despite lower partial not in place.  Pt advises consuming some meats causes gum pain - thus recommend pt be allowed to choose her own food from the menu.  Pt admits to difficulty swallowing several pills at a time, and advised single pills or medication with applesauce.   No SlP follow up needed as all education completed. SLP Visit Diagnosis: Dysphagia, unspecified (R13.10)    Aspiration Risk  Mild aspiration risk    Diet Recommendation Regular;Thin liquid   Liquid Administration via: Cup;Straw Medication Administration: Other (Comment) (as tolerated) Compensations: Slow rate;Small sips/bites Postural Changes: Seated upright at 90 degrees;Remain upright for at least 30 minutes after po intake    Other  Recommendations Oral Care Recommendations: Oral care BID   Follow up Recommendations None      Frequency and Duration     n/a        Prognosis   n/a     Swallow Study   General Date of Onset: 01/10/21 HPI: 87 yo female adm to [facility] with AMS, respiratory deficits - found to have left lobe pna.  Pt PMH + for CHF, DM, HLD, hypothyroidism, remote smoker - many years ago and she has GERD.  Swallow evaluation ordered. Type of Study: Bedside Swallow Evaluation Previous Swallow Assessment: none Diet Prior to this Study: Regular;Thin liquids Temperature Spikes Noted: No Respiratory Status: Nasal cannula History of Recent Intubation: No Behavior/Cognition: Alert;Cooperative;Pleasant mood Oral Cavity Assessment: Within Functional Limits Oral Care Completed by SLP: No Oral Cavity - Dentition: Dentures, top;Other (Comment) (lower front teeth present, partial causes removal of tooth per pt) Vision: Functional for self-feeding Self-Feeding Abilities: Able to feed self Patient Positioning: Upright in bed Baseline Vocal Quality: Normal Volitional Cough: Strong Volitional Swallow: Able to elicit    Oral/Motor/Sensory Function Overall Oral Motor/Sensory Function: Within functional limits   Ice Chips Ice chips: Not tested   Thin Liquid Thin Liquid: Within functional limits Presentation: Self Fed;Straw    Nectar Thick Nectar Thick Liquid: Not tested   Honey Thick Honey Thick Liquid: Not tested   Puree Puree: Within functional limits Presentation: Self Fed;Spoon   Solid     Solid: Within functional limits Presentation: Self Fed;Spoon      Chales Abrahams 01/10/2021,6:00 PM  Rolena Infante, MS [facility] SLP Acute Rehab Services Office [phone] Pager [phone]

## 2021-01-10 NOTE — Evaluation (Signed)
Physical Therapy Evaluation Patient Details Name: Sue [name] MRN: [id number] DOB: 02-29-1930 Today's Date: 01/10/2021   History of Present Illness  87 y.o. female presented with complaints of generalized weakness.  Patient's son lives with her and apparently was concerned regarding progressive weakness where she could not walk with her walker or get up from the commode without help. Dx of PNA, acute on chronic CHF. Pt  with history h/o CHF, diabetes mellitus, hyperlipidemia, hypothyroidism who was a remote smoker.  Clinical Impression  Pt admitted with above diagnosis. Mod assist for supine to sit. Min assist to take a few pivotal steps with RW from bed to 3 in 1, then to recliner. Pt incontinent of urine with mobility. SaO2 91-93% on room air with activity, 3/4 dyspnea with activity. ST-SNF recommended. Pt currently with functional limitations due to the deficits listed below (see PT Problem List). Pt will benefit from skilled PT to increase their independence and safety with mobility to allow discharge to the venue listed below.       Follow Up Recommendations SNF;Supervision for mobility/OOB;Supervision/Assistance - 24 hour    Equipment Recommendations  None recommended by PT    Recommendations for Other Services       Precautions / Restrictions Precautions Precautions: Fall Precaution Comments: pt reports 2 falls in past 1 year Restrictions Weight Bearing Restrictions: No      Mobility  Bed Mobility Overal bed mobility: Needs Assistance Bed Mobility: Supine to Sit     Supine to sit: Mod assist     General bed mobility comments: assist to raise trunk    Transfers Overall transfer level: Needs assistance Equipment used: Rolling walker (2 wheeled) Transfers: Sit to/from [employer] Sit to Stand: Mod assist Stand pivot transfers: Min assist       General transfer comment: assist to rise/steady, VCs hand placement, min A to guide hips to 3 in 1 then  to recliner and to steer RW, pt with poor motor planning (steered RW right into 3 in 1)  Ambulation/Gait   Gait Distance (Feet): 5 Feet Assistive device: Rolling walker (2 wheeled) Gait Pattern/deviations: Step-through pattern;Decreased step length - right;Decreased step length - left Gait velocity: decr   General Gait Details: 3' + 2' with RW bed to 3 in 1 to recliner, min A to steer RW, VCs for positioning in RW  Stairs            Wheelchair Mobility    Modified Rankin (Stroke Patients Only)       Balance Overall balance assessment: Needs assistance;History of Falls Sitting-balance support: Feet supported;No upper extremity supported Sitting balance-Leahy Scale: Good     Standing balance support: Bilateral upper extremity supported Standing balance-Leahy Scale: Poor Standing balance comment: relies on BUE support                             Pertinent Vitals/Pain Pain Assessment: No/denies pain    Home Living Family/patient expects to be discharged to:: Private residence Living Arrangements: Children Available Help at Discharge: Family;Available 24 hours/day   Home Access: Level entry     Home Layout: One level Home Equipment: Walker - 2 wheels;Shower seat      Prior Function Level of Independence: Independent with assistive device(s)         Comments: walked with RW     Hand Dominance        Extremity/Trunk Assessment   Upper Extremity Assessment Upper Extremity  Assessment: Generalized weakness    Lower Extremity Assessment Lower Extremity Assessment: Generalized weakness (knee ext 4/5 B, sensation intact to light touch B feet)    Cervical / Trunk Assessment Cervical / Trunk Assessment: Normal  Communication   Communication: HOH  Cognition Arousal/Alertness: Awake/alert Behavior During Therapy: WFL for tasks assessed/performed Overall Cognitive Status: Within Functional Limits for tasks assessed                                         General Comments General comments (skin integrity, edema, etc.): 3/4 dyspnea with activity, dyspnea resolved within 1 minute of rest,  SaO2 91-93% on room air with activity    Exercises     Assessment/Plan    PT Assessment Patient needs continued PT services  PT Problem List Decreased strength;Decreased mobility;Decreased activity tolerance;Decreased balance       PT Treatment Interventions Therapeutic activities;Therapeutic exercise;Functional mobility training;Patient/family education    PT Goals (Current goals can be found in the Care Plan section)  Acute Rehab PT Goals Patient Stated Goal: none stated Time For Goal Achievement: 01/24/21 Potential to Achieve Goals: Fair    Frequency Min 2X/week   Barriers to discharge        Co-evaluation               AM-PAC PT "6 Clicks" Mobility  Outcome Measure Help needed turning from your back to your side while in a flat bed without using bedrails?: A Little Help needed moving from lying on your back to sitting on the side of a flat bed without using bedrails?: A Lot Help needed moving to and from a bed to a chair (including a wheelchair)?: A Lot Help needed standing up from a chair using your arms (e.g., wheelchair or bedside chair)?: A Lot Help needed to walk in hospital room?: A Lot Help needed climbing 3-5 steps with a railing? : A Lot 6 Click Score: 13    End of Session Equipment Utilized During Treatment: Gait belt Activity Tolerance: Patient limited by fatigue Patient left: in chair;with call bell/phone within reach;with chair alarm set;with nursing/sitter in room Nurse Communication: Mobility status PT Visit Diagnosis: History of falling (Z91.81);Difficulty in walking, not elsewhere classified (R26.2)    Time: 8546-2703 PT Time Calculation (min) (ACUTE ONLY): 27 min   Charges:   PT Evaluation $PT Eval Moderate Complexity: 1 Mod PT Treatments $Therapeutic Activity: 8-22 mins        Ralene Bathe Kistler PT 01/10/2021  Acute Rehabilitation Services Pager [phone] Office [phone]

## 2021-01-11 LAB — CBC WITH DIFFERENTIAL/PLATELET
Abs Immature Granulocytes: 0.1 10*3/uL — ABNORMAL HIGH (ref 0.00–0.07)
Basophils Absolute: 0.1 10*3/uL (ref 0.0–0.1)
Basophils Relative: 1 %
Eosinophils Absolute: 0.7 10*3/uL — ABNORMAL HIGH (ref 0.0–0.5)
Eosinophils Relative: 6 %
HCT: 42.9 % (ref 36.0–46.0)
Hemoglobin: 13.9 g/dL (ref 12.0–15.0)
Immature Granulocytes: 1 %
Lymphocytes Relative: 12 %
Lymphs Abs: 1.3 10*3/uL (ref 0.7–4.0)
MCH: 31.6 pg (ref 26.0–34.0)
MCHC: 32.4 g/dL (ref 30.0–36.0)
MCV: 97.5 fL (ref 80.0–100.0)
Monocytes Absolute: 1.2 10*3/uL — ABNORMAL HIGH (ref 0.1–1.0)
Monocytes Relative: 11 %
Neutro Abs: 7.9 10*3/uL — ABNORMAL HIGH (ref 1.7–7.7)
Neutrophils Relative %: 69 %
Platelets: 458 10*3/uL — ABNORMAL HIGH (ref 150–400)
RBC: 4.4 MIL/uL (ref 3.87–5.11)
RDW: 15.2 % (ref 11.5–15.5)
WBC: 11.3 10*3/uL — ABNORMAL HIGH (ref 4.0–10.5)
nRBC: 0 % (ref 0.0–0.2)

## 2021-01-11 LAB — BASIC METABOLIC PANEL
Anion gap: 9 (ref 5–15)
BUN: 18 mg/dL (ref 8–23)
CO2: 26 mmol/L (ref 22–32)
Calcium: 8.6 mg/dL — ABNORMAL LOW (ref 8.9–10.3)
Chloride: 102 mmol/L (ref 98–111)
Creatinine, Ser: 0.9 mg/dL (ref 0.44–1.00)
GFR, Estimated: >60 mL/min (ref >60)
Glucose, Bld: 127 mg/dL — ABNORMAL HIGH (ref 70–99)
Potassium: 2.8 mmol/L — ABNORMAL LOW (ref 3.5–5.1)
Sodium: 137 mmol/L (ref 135–145)

## 2021-01-11 LAB — GLUCOSE, CAPILLARY
Glucose-Capillary: 143 mg/dL — ABNORMAL HIGH (ref 70–99)
Glucose-Capillary: 133 mg/dL — ABNORMAL HIGH (ref 70–99)

## 2021-01-11 MED ORDER — AMOXICILLIN-POT CLAVULANATE 875-125 MG PO TABS
1.0000 | ORAL_TABLET | Freq: Two times a day (BID) | ORAL | Status: DC
  Administered 2021-01-11 – 2021-01-13 (×5): 1 via ORAL
  Filled 2021-01-11 (×5): qty 1

## 2021-01-11 MED ORDER — POTASSIUM CHLORIDE CRYS ER 20 MEQ PO TBCR
40.0000 meq | EXTENDED_RELEASE_TABLET | ORAL | Status: AC
  Administered 2021-01-11 (×2): 40 meq via ORAL
  Filled 2021-01-11 (×2): qty 2

## 2021-01-11 MED ORDER — FUROSEMIDE 40 MG PO TABS
40.0000 mg | ORAL_TABLET | Freq: Two times a day (BID) | ORAL | Status: DC
  Administered 2021-01-12 – 2021-01-13 (×3): 40 mg via ORAL
  Filled 2021-01-11 (×3): qty 1

## 2021-01-11 NOTE — Progress Notes (Addendum)
PROGRESS NOTE    Sue [name]  MEQ:[id number] DOB: 1929/09/17 DOA: 01/09/2021 PCP: Sigmund Hazel, MD    Brief Narrative:  Sue [name] was admitted to the hospital with the working diagnosis of acute hypoxic respiratory failure due to community-acquired pneumonia, acute pulmonary edema.  87 year old female past medical history for heart failure, type II diabetes mellitus, dyslipidemia and hypothyroidism who presents with generalized weakness.  At home patient was noted to have progressive weakness, decreased physical functional capacity, that prompted her to be brought to the hospital.  On her initial physical examination she was febrile 102 F, she was tachypneic, respiratory rate 30-40, oxygen saturation 88%, blood pressure 120/85, heart rate 84.  She had increased work of breathing, no wheezing or rhonchi, heart S1-S2, present, rhythmic, soft abdomen, lower extremity edema.  Sodium 135, potassium 3.0, chloride 98, bicarb 23, glucose 207, BUN 17, creatinine 0.91, white count 18.5, hemoglobin 16.8, hematocrit 50.5, platelets 464. SARS COVID-19 negative. Urinalysis specific gravity 1.013, 11-20 red cells,> 50 white cells.  Head CT with atrophy, small vessel chronic ischemic changes, no acute abnormalities. Chest radiograph with increased vascular interstitial markings bilaterally, bibasilar opacities, retrocardiac on the left.  EKG 84 bpm, normal axis, right bundle branch block, sinus rhythm with PACs, no significant ST segment or T wave changes.  Patient has been placed on antibiotic therapy and IV diuresis with improvement in oxygenation. Continue to be very weak and deconditioned, will need SNF.    Assessment & Plan:   Principal Problem:   Acute respiratory failure with hypoxemia (HCC) Active Problems:   Diabetes (HCC)   Pneumonia   Essential hypertension   Hyperlipidemia   Hypothyroidism   Acute on chronic diastolic CHF (congestive heart failure) (HCC)    1. Acute  hypoxemic respiratory failure due to community acquired pneumonia, bilateral lower lobes.  Oxygenation has improved with oxymetry at 98% on room air today.   Likely component of pulmonary edema, will transition to Augmentin to limit IV medications, discontinue doxycycline for now.   Patient very weak and deconditioned, continue PT and OT, consult nutrition.   2. Diastolic heart failure acute on chronic with acute pulmonary edema, stable blood pressure with systolic 116 mmHg, warm lower extremities, documented urine out put 700 cc.   Continue diuresis with furosemide, plan to transition to oral in am.  Continue with carvedilol.  Further work up with echocardiogram, old records personally reviewed echo from 2019 with EF 40 to 45%, with anterolateral hypokinesis.   3. T2DM. Dyslipidemia.  Fasting glucose is 127, capillary 157 and 143.  At home patient not on antihyperglycemic agents, continue diet control for now.  Continue with statin therapy.    4. Hypothyroid.  On levothyroxine  5. Obesity class 1/ depression. Her calculated BMI is 34.3, On clonazepam.   6. Hypokalemia. Renal function with serum cr stable at 0.97 with K at 2,0 and serum bicarbonate at 26. Plan to add another 80 meq Kcl today in 2 divided doses, follow renal function in am, with electrolytes, follow Mg.   Patient continue to be at high risk for worsening respiratory failure.   Status is: Inpatient  Remains inpatient appropriate because:Inpatient level of care appropriate due to severity of illness   Dispo: The patient is from: Home              Anticipated d/c is to: SNF              Patient currently is not medically stable to d/c.  Difficult to place patient No   DVT prophylaxis: Enoxaparin   Code Status:   full  Family Communication:  I spoke over the phone with the patient's son and daughter about patient's  condition, plan of care, prognosis and all questions were addressed.    Antimicrobials:    Ceftriaxone and doxycycline change to oral Augmentin     Subjective: Patient is feeling better, dyspnea has improved, but continue to be very weak and deconditioned, no back to her baseline,   Objective: Vitals:   01/10/21 0503 01/10/21 1430 01/10/21 2033 01/11/21 0551  BP: 115/63 119/69 (!) 126/55 116/60  Pulse: 62 65 68 (!) 57  Resp: 16 (!) 22 18 16   Temp: (!) 97.5 F (36.4 C) 97.7 F (36.5 C) (!) 97.5 F (36.4 C) 97.8 F (36.6 C)  TempSrc: Oral Oral Oral Oral  SpO2: 98% 95% 95% 98%  Weight:      Height:        Intake/Output Summary (Last 24 hours) at 01/11/2021 1204 Last data filed at 01/11/2021 0846 Gross per 24 hour  Intake 340 ml  Output 700 ml  Net -360 ml   Filed Weights   01/09/21 0938  Weight: 90.7 kg    Examination:   General: Not in pain or dyspnea, deconditioned  Neurology: Awake and alert, non focal  E ENT: no pallor, no icterus, oral mucosa moist Cardiovascular: No JVD. S1-S2 present, rhythmic, no gallops, rubs, or murmurs. Trace lower extremity edema. Pulmonary: positive breath sounds bilaterally, with wheezing, or rhonchi on anterior auscultation. Gastrointestinal. Abdomen soft and non tender Skin. No rashes Musculoskeletal: no joint deformities     Data Reviewed: I have personally reviewed following labs and imaging studies  CBC: Recent Labs  Lab 01/09/21 1027 01/09/21 1405 01/10/21 0559 01/11/21 0654  WBC 18.5* 18.7* 11.3* 11.3*  NEUTROABS 16.5*  --   --  7.9*  HGB 16.8* 15.1* 14.4 13.9  HCT 50.5* 45.1 44.1 42.9  MCV 96.6 95.3 96.1 97.5  PLT 464* 465* 336 458*   Basic Metabolic Panel: Recent Labs  Lab 01/09/21 1027 01/09/21 1405 01/10/21 0559 01/11/21 0654  NA 135  --  137 137  K 3.0*  --  3.0* 2.8*  CL 98  --  102 102  CO2 23  --  25 26  GLUCOSE 207*  --  127* 127*  BUN 17  --  14 18  CREATININE 0.91 0.99 0.97 0.90  CALCIUM 8.5*  --  8.7* 8.6*   GFR: Estimated Creatinine Clearance: 46.2 mL/min (by C-G formula based  on SCr of 0.9 mg/dL). Liver Function Tests: Recent Labs  Lab 01/09/21 1027  AST 20  ALT 27  ALKPHOS 103  BILITOT 1.1  PROT 7.4  ALBUMIN 4.0   No results for input(s): LIPASE, AMYLASE in the last 168 hours. No results for input(s): AMMONIA in the last 168 hours. Coagulation Profile: Recent Labs  Lab 01/09/21 1027  INR 1.1   Cardiac Enzymes: No results for input(s): CKTOTAL, CKMB, CKMBINDEX, TROPONINI in the last 168 hours. BNP (last 3 results) No results for input(s): PROBNP in the last 8760 hours. HbA1C: Recent Labs    01/09/21 1405  HGBA1C 6.6*   CBG: Recent Labs  Lab 01/10/21 0728 01/10/21 1120 01/10/21 1615 01/10/21 2034 01/11/21 0723  GLUCAP 130* 148* 115* 157* 143*   Lipid Profile: No results for input(s): CHOL, HDL, LDLCALC, TRIG, CHOLHDL, LDLDIRECT in the last 72 hours. Thyroid Function Tests: No results for input(s): TSH, T4TOTAL, FREET4,  T3FREE, THYROIDAB in the last 72 hours. Anemia Panel: No results for input(s): VITAMINB12, FOLATE, FERRITIN, TIBC, IRON, RETICCTPCT in the last 72 hours.    Radiology Studies: I have reviewed all of the imaging during this hospital visit personally     Scheduled Meds: . carvedilol  6.25 mg Oral BID  . doxycycline  100 mg Oral Q12H  . enoxaparin (LOVENOX) injection  40 mg Subcutaneous Q24H  . furosemide  40 mg Intravenous BID  . insulin aspart  0-15 Units Subcutaneous TID WC  . levothyroxine  112 mcg Oral QAC breakfast  . potassium chloride  40 mEq Oral Q4H  . simvastatin  20 mg Oral Daily   Continuous Infusions: . cefTRIAXone (ROCEPHIN)  IV Stopped (01/10/21 2216)     LOS: 2 days        Sue [name] Sue Gula, MD

## 2021-01-11 NOTE — Progress Notes (Signed)
Portable ultrasound was used to evaluate: Dyspnea/hypoxia  Lungs: mostly A lines. Heart. Longitudinal and short access with preserved LV systolic function.  Change to oral furosemide.

## 2021-01-12 ENCOUNTER — Ambulatory Visit: Payer: HMO | Admitting: Cardiology

## 2021-01-12 ENCOUNTER — Inpatient Hospital Stay (HOSPITAL_COMMUNITY): Payer: HMO

## 2021-01-12 DIAGNOSIS — R0609 Other forms of dyspnea: Secondary | ICD-10-CM

## 2021-01-12 LAB — CBC WITH DIFFERENTIAL/PLATELET
Abs Immature Granulocytes: 0.07 10*3/uL (ref 0.00–0.07)
Basophils Absolute: 0.1 10*3/uL (ref 0.0–0.1)
Basophils Relative: 1 %
Eosinophils Absolute: 0.5 10*3/uL (ref 0.0–0.5)
Eosinophils Relative: 6 %
HCT: 45.9 % (ref 36.0–46.0)
Hemoglobin: 15.1 g/dL — ABNORMAL HIGH (ref 12.0–15.0)
Immature Granulocytes: 1 %
Lymphocytes Relative: 17 %
Lymphs Abs: 1.7 10*3/uL (ref 0.7–4.0)
MCH: 31.5 pg (ref 26.0–34.0)
MCHC: 32.9 g/dL (ref 30.0–36.0)
MCV: 95.8 fL (ref 80.0–100.0)
Monocytes Absolute: 1 10*3/uL (ref 0.1–1.0)
Monocytes Relative: 10 %
Neutro Abs: 6.3 10*3/uL (ref 1.7–7.7)
Neutrophils Relative %: 65 %
Platelets: 436 10*3/uL — ABNORMAL HIGH (ref 150–400)
RBC: 4.79 MIL/uL (ref 3.87–5.11)
RDW: 14.9 % (ref 11.5–15.5)
WBC: 9.7 10*3/uL (ref 4.0–10.5)
nRBC: 0 % (ref 0.0–0.2)

## 2021-01-12 LAB — ECHOCARDIOGRAM COMPLETE
AR max vel: 1.52 cm2
AV Area VTI: 1.51 cm2
AV Area mean vel: 1.55 cm2
AV Mean grad: 6 mmHg
AV Peak grad: 10.8 mmHg
Ao pk vel: 1.64 m/s
Area-P 1/2: 1.92 cm2
Calc EF: 52.5 %
Height: 64 in
MV VTI: 1.91 cm2
S' Lateral: 1.7 cm
Single Plane A2C EF: 66 %
Single Plane A4C EF: 28.8 %
Weight: 3200 oz

## 2021-01-12 LAB — BASIC METABOLIC PANEL
Anion gap: 8 (ref 5–15)
BUN: 19 mg/dL (ref 8–23)
CO2: 29 mmol/L (ref 22–32)
Calcium: 8.9 mg/dL (ref 8.9–10.3)
Chloride: 103 mmol/L (ref 98–111)
Creatinine, Ser: 0.94 mg/dL (ref 0.44–1.00)
GFR, Estimated: 58 mL/min — ABNORMAL LOW (ref >60)
Glucose, Bld: 120 mg/dL — ABNORMAL HIGH (ref 70–99)
Potassium: 3.5 mmol/L (ref 3.5–5.1)
Sodium: 140 mmol/L (ref 135–145)

## 2021-01-12 LAB — MAGNESIUM: Magnesium: 2 mg/dL (ref 1.7–2.4)

## 2021-01-12 MED ORDER — SENNOSIDES-DOCUSATE SODIUM 8.6-50 MG PO TABS
1.0000 | ORAL_TABLET | Freq: Every day | ORAL | Status: DC
  Administered 2021-01-12: 1 via ORAL
  Filled 2021-01-12: qty 1

## 2021-01-12 MED ORDER — POTASSIUM CHLORIDE CRYS ER 20 MEQ PO TBCR
20.0000 meq | EXTENDED_RELEASE_TABLET | Freq: Once | ORAL | Status: AC
  Administered 2021-01-12: 20 meq via ORAL
  Filled 2021-01-12: qty 1

## 2021-01-12 MED ORDER — BISACODYL 10 MG RE SUPP
10.0000 mg | Freq: Once | RECTAL | Status: AC
  Administered 2021-01-12: 10 mg via RECTAL
  Filled 2021-01-12: qty 1

## 2021-01-12 NOTE — Progress Notes (Incomplete)
Cardiology Office Note:    Date:  01/12/2021   ID:  Sue [name], DOB [birth year], MRN [id number]  PCP:  Kathyrn Lass, MD  Cardiologist:  Buford Dresser, MD PhD  Referring MD: Seward Carol, MD   CC: follow up  History of Present Illness:    Sue [name] is a 87 y.o. female with a hx of hypothyroidism, RBBB, chronic systolic and diastolic heart failure who is seen for follow up today. I initially met her 08/01/18 as a new consult at the request of Seward Carol, MD for the evaluation and management of abnormal echocardiogram.  Prior Visit: Main concern today is LE edema. No PND or orthopnea, sleeps on 1 pillow. Can vacuum the house. Taking furosemide and potassium BID, unsure of dose. Discussed potential etiologies of LE edema, management.   Denies chest pain, shortness of breath at rest or with normal exertion. No PND, orthopnea or unexpected weight gain. No syncope or palpitations.  Today: She  Past Medical History:  Diagnosis Date   CHF (congestive heart failure) ([place])    Diabetes mellitus without complication ([place])    Hyperlipidemia    Thyroid disease    Reported PMH/per notes: Hypercholesterolemia Hypothyroidism Osteoarthritis Anxiety and depression Chronic systolic and diastolic heart failure Right bundle branch block Fatigue  No past surgical history on file.  Current Medications: Current Facility-Administered Medications on File Prior to Visit  Medication   acetaminophen (TYLENOL) tablet 1,000 mg   albuterol (PROVENTIL) (2.5 MG/3ML) 0.083% nebulizer solution 2.5 mg   amoxicillin-clavulanate (AUGMENTIN) 875-125 MG per tablet 1 tablet   carvedilol (COREG) tablet 6.25 mg   clonazepam (KLONOPIN) disintegrating tablet 0.25 mg   enoxaparin (LOVENOX) injection 40 mg   furosemide (LASIX) tablet 40 mg   guaiFENesin (ROBITUSSIN) 100 MG/5ML solution 100 mg   hydrALAZINE (APRESOLINE) injection 10 mg   levothyroxine (SYNTHROID) tablet 112  mcg   ondansetron (ZOFRAN) tablet 4 mg   Or   ondansetron (ZOFRAN) injection 4 mg   simvastatin (ZOCOR) tablet 20 mg   sodium chloride (OCEAN) 0.65 % nasal spray 1 spray   Current Outpatient Medications on File Prior to Visit  Medication Sig   acetaminophen (TYLENOL) 500 MG tablet Take 1,000 mg by mouth every 6 (six) hours as needed for mild pain.   carvedilol (COREG) 3.125 MG tablet TAKE TWO TABLETS BY MOUTH TWICE A DAY (Patient taking differently: 3.125 mg 2 (two) times daily with a meal.)   clonazePAM (KLONOPIN) 0.5 MG tablet Take 0.25-0.5 mg by mouth 2 (two) times daily as needed for anxiety.   furosemide (LASIX) 20 MG tablet Take 40 mg by mouth 2 (two) times daily.   levothyroxine (SYNTHROID, LEVOTHROID) 112 MCG tablet Take 112 mcg by mouth daily before breakfast.   nitrofurantoin, macrocrystal-monohydrate, (MACROBID) 100 MG capsule Take 100 mg by mouth 2 (two) times daily.   potassium chloride (K-DUR,KLOR-CON) 10 MEQ tablet Take 10 mEq by mouth 2 (two) times daily.    simvastatin (ZOCOR) 20 MG tablet Take 20 mg by mouth daily.     Allergies:   Patient has no known allergies.   Social History   Tobacco Use   Smoking status: Former Smoker    Types: Cigarettes    Quit date: 09/06/1958    Years since quitting: 62.3   Smokeless tobacco: Never Used  Vaping Use   Vaping Use: Never used  Substance Use Topics   Alcohol use: No   Drug use: No    Family History: The patient's mother passed  away at age 53, father passed away age 52, unclear cause. Son passed away as a result of injuries from a car accident. Three living children.  ROS:   Please see the history of present illness.   (+) Additional pertinent ROS otherwise unremarkable.  EKGs/Labs/Other Studies Reviewed:    The following studies were reviewed today:  Echo 06/30/18 Study Conclusions - Left ventricle: The cavity size was normal. Wall thickness was   increased in a pattern of mild LVH. Systolic  function was mildly   to moderately reduced. The estimated ejection fraction was in the   range of 40% to 45%. Incoordinate septal motion and anterolateral   hypokinesis. Doppler parameters are consistent with abnormal left   ventricular relaxation (grade 1 diastolic dysfunction). The E/e&'   ratio is between 8-15, suggesting indeterminate LV filling   pressure. - Mitral valve: Mildly thickened leaflets . There was trivial   regurgitation. - Left atrium: The atrium was normal in size. - Pericardium, extracardiac: A trivial pericardial effusion was   identified posterior to the heart.  Impressions: - LVEF 40-45%, mild LVH, incoordinate septal motion and   anterolateral hypokinesis, grade 1 DD, indeterminate LV filling   pressure, normal LA size, trivial posterior pericardial effusion.  EKG:  EKG is personally reviewed.    07/21/2020: normal sinus rhythm with RBBB 01/12/2021: EKG is not ordered today.***  Recent Labs: 01/09/2021: ALT 27; B Natriuretic Peptide 57.0 01/12/2021: BUN 19; Creatinine, Ser 0.94; Hemoglobin 15.1; Magnesium 2.0; Platelets 436; Potassium 3.5; Sodium 140  Recent Lipid Panel No results found for: CHOL, TRIG, HDL, CHOLHDL, VLDL, LDLCALC, LDLDIRECT  Physical Exam:    VS:  There were no vitals taken for this visit.    Wt Readings from Last 3 Encounters:  01/09/21 200 lb (90.7 kg)  07/21/20 202 lb (91.6 kg)  08/01/18 206 lb (93.4 kg)    GEN: Well nourished, well developed in no acute distress HEENT: Normal, moist mucous membranes NECK: No JVD CARDIAC: regular rhythm, normal S1 and S2, no rubs or gallops. No murmur. VASCULAR: Radial and DP pulses 2+ bilaterally. No carotid bruits RESPIRATORY:  Clear to auscultation without rales, wheezing or rhonchi  ABDOMEN: Soft, non-tender, non-distended MUSCULOSKELETAL:  Ambulates independently ***SKIN: Warm and dry, 1+ bilateral LE edema NEUROLOGIC:  Alert and oriented x 3. No focal neuro deficits noted. PSYCHIATRIC:   Normal affect   ASSESSMENT:    No diagnosis found. PLAN:    Chronic systolic and diastolic heart failure, with major symptom of LE edema: -echo as above shows EF 40-45%, with anterolateral hypokinesis and grade 1 diastolic dysfunction -see prior discussion. She declines ischemic evaluation -appears euvolemic today. Given instructions on heart failure education -tolerating carvedilol -we had planned for ACEi/ARB given reduced EF. Had discussed entresto. Does not appear that she was started on these. With diuretic recommendations (below), we will hold on ARB at this time but discuss at next visit -discussed compression stockings, elevation, salt avoidance given LE edema -discussed that she can take lasix/potassium BID on days where her weight is elevated or legs are very swollen. If this becomes routine she will call and let me know  Type II diabetes: -A1c 6.7, which meets diagnosis criteria -was on metformin, no longer taking -consider SGLT2i in the future given cardiomyopathy  Hyperlipidemia: -given age, will not intensify beyond simvastatin after shared decision making  CV risk counseling and prevention: -recommend heart healthy/Mediterranean diet, with whole grains, fruits, vegetable, fish, lean meats, nuts, and olive oil. Limit salt. -recommend  moderate walking, 3-5 times/week for 30-50 minutes each session. Aim for at least 150 minutes.week. Goal should be pace of 3 miles/hours, or walking 1.5 miles in 30 minutes -recommend avoidance of tobacco products. Avoid excess alcohol.  Plan for follow up: 3 mos or sooner as needed to monitor symptoms***  Buford Dresser, MD, PhD [place]   [facility]   Medication Adjustments/Labs and Tests Ordered: Current medicines are reviewed at length with the patient today.  Concerns regarding medicines are outlined above.  No orders of the defined types were placed in this encounter.  No orders of the defined types were placed in  this encounter.   There are no Patient Instructions on file for this visit.   I,Mathew Stumpf,acting as a Education administrator for [employer], MD.,have documented all relevant documentation on the behalf of Buford Dresser, MD,as directed by  Buford Dresser, MD while in the presence of Buford Dresser, MD.  ***  Signed, Buford Dresser, MD PhD 01/12/2021  [facility]

## 2021-01-12 NOTE — Progress Notes (Signed)
Nutrition Brief Note  RD consulted for nutritional assessment.  Pt reports she has been trying to lose weight and eat less at meals. Consumed some pancakes and sausage this morning for breakfast. Has no nutritional concerns right now.  Reviewed basic healthy nutrition principles with patient. Not interested in high protein supplement at this time.  Pt ended visit by calling son on the phone.  Wt Readings from Last 15 Encounters:  01/09/21 90.7 kg  07/21/20 91.6 kg  08/01/18 93.4 kg    Body mass index is 34.33 kg/m. Patient meets criteria for obesity based on current BMI.   Current diet order is Heart healthy/ CHO modified, patient is consuming approximately 50-100% of meals at this time. Labs and medications reviewed.   No nutrition interventions warranted at this time. If nutrition issues arise, please consult RD.   Tilda Franco, MS, RD, LDN Inpatient Clinical Dietitian Contact information available via Amion

## 2021-01-12 NOTE — Care Management Important Message (Signed)
Medicare IM printed remotely for Social Work team to give to the patient. 

## 2021-01-12 NOTE — Progress Notes (Signed)
*  PRELIMINARY RESULTS* Echocardiogram 2D Echocardiogram has been performed.  Neomia Dear RDCS 01/12/2021, 2:01 PM

## 2021-01-12 NOTE — Progress Notes (Addendum)
PROGRESS NOTE    Sue [name]  WUJ:[id number] DOB: 1930-05-24 DOA: 01/09/2021 PCP: Sigmund Hazel, MD    Brief Narrative:  Sue [name] was admitted to the hospital with the working diagnosis ofacute hypoxic respiratory failure due to community-acquired pneumonia, acute pulmonary edema.  87 year old female past medical history for heart failure, type II diabetes mellitus, dyslipidemia and hypothyroidism who presents with generalized weakness. At home patient was noted to have progressive weakness, decreased physical functional capacity,that prompted her to be brought to the hospital. On her initial physical examination she was febrile 102 F, she was tachypneic, respiratory rate 30-40, oxygen saturation 88%, blood pressure 120/85, heart rate 84. She had increased work of breathing, no wheezing or rhonchi, heart S1-S2, present, rhythmic, soft abdomen, lower extremity edema.  Sodium 135, potassium 3.0, chloride 98, bicarb 23, glucose 207, BUN 17, creatinine 0.91, white count 18.5, hemoglobin 16.8, hematocrit 50.5, platelets 464. SARS COVID-19 negative. Urinalysis specific gravity 1.013, 11-20 red cells,>50 white cells.  Head CT with atrophy, small vessel chronic ischemic changes, no acute abnormalities. Chest radiograph with increased vascular interstitial markings bilaterally, bibasilar opacities, retrocardiac on the left.  EKG 84 bpm, normal axis, right bundle branch block, sinus rhythm with PACs, no significant ST segment or T wave changes.  Patient has been placed on antibiotic therapy and IV diuresis with improvement in oxygenation. Continue to be very weak and deconditioned, will need SNF.     Assessment & Plan:   Principal Problem:   Acute respiratory failure with hypoxemia (HCC) Active Problems:   Diabetes (HCC)   Pneumonia   Essential hypertension   Hyperlipidemia   Hypothyroidism   Acute on chronic diastolic CHF (congestive heart failure) (HCC)    1.Acute  hypoxemic respiratory failure due to community acquired pneumonia, bilateral lower lobes.  Continue to oxygenate well, hear oxymetry has been 98% on room air, this am is on 2 L per [place] with oxymetry at 96.  Wbc continue to trend down to 9,7, blood culture with no growth.   Continue to encourage out of bed to chair tid with meals, PT and OT. Her son will provide supervision at home,   2. Diastolic heart failure acute on chronic with acute pulmonary edema, Clinically patient more euvolemic, lung [place] with mostly A lines.    Transition back to home regimen with furosemide 40 mg po bid.  Echocardiogram performed today, will follow on result.   3. T2DM. Dyslipidemia. capillary glucose has been stable. Off insulin. Continue with statin therapy. Fasting glucose this am 120 mg/dl.   4. Hypothyroid.  Continue with levothyroxine  5. Obesity class 1/ depression. Calculated BMI is 34.3, continue with clonazepam.   6. Hypokalemia. renal function with serum cr at 0,94 with K at 3,5 and serum bicarbonate at 29. Mg 2,0.  Add 20 meq Kcl and check renal panel in am.     Status is: Inpatient  Remains inpatient appropriate because:IV treatments appropriate due to intensity of illness or inability to take PO   Dispo: The patient is from: Home              Anticipated d/c is to: Home              Patient currently is not medically stable to d/c.   Difficult to place patient No    DVT prophylaxis: Enoxaparin   Code Status:   full  Family Communication:  No family at the bedside     Subjective: Patient continue slow recovery but continue  to be very weak, no nausea or vomiting. No chest pain, dyspnea continue to improve.   Objective: Vitals:   01/11/21 1821 01/11/21 1955 01/12/21 0516 01/12/21 1323  BP:  135/71 (!) 122/59 130/76  Pulse:  76 63 65  Resp:  16 16 16   Temp:  97.7 F (36.5 C) 98 F (36.7 C) 98.7 F (37.1 C)  TempSrc:  Oral Oral   SpO2: (!) 67% 96% 93% 96%  Weight:       Height:        Intake/Output Summary (Last 24 hours) at 01/12/2021 1535 Last data filed at 01/12/2021 1444 Gross per 24 hour  Intake 240 ml  Output 1600 ml  Net -1360 ml   Filed Weights   01/09/21 0938  Weight: 90.7 kg    Examination:   General: Not in pain or dyspnea, deconditioned  Neurology: Awake and alert, non focal  E ENT: psitive pallor, no icterus, oral mucosa moist Cardiovascular: No JVD. S1-S2 present, rhythmic, no gallops, rubs, or murmurs. No lower extremity edema. Pulmonary: positive breath sounds bilaterally no wheezing, rhonchi or rales, anterior auscultation Gastrointestinal. Abdomen soft and non tender Skin. No rashes Musculoskeletal: no joint deformities     Data Reviewed: I have personally reviewed following labs and imaging studies  CBC: Recent Labs  Lab 01/09/21 1027 01/09/21 1405 01/10/21 0559 01/11/21 0654 01/12/21 0521  WBC 18.5* 18.7* 11.3* 11.3* 9.7  NEUTROABS 16.5*  --   --  7.9* 6.3  HGB 16.8* 15.1* 14.4 13.9 15.1*  HCT 50.5* 45.1 44.1 42.9 45.9  MCV 96.6 95.3 96.1 97.5 95.8  PLT 464* 465* 336 458* 436*   Basic Metabolic Panel: Recent Labs  Lab 01/09/21 1027 01/09/21 1405 01/10/21 0559 01/11/21 0654 01/12/21 0521  NA 135  --  137 137 140  K 3.0*  --  3.0* 2.8* 3.5  CL 98  --  102 102 103  CO2 23  --  25 26 29   GLUCOSE 207*  --  127* 127* 120*  BUN 17  --  14 18 19   CREATININE 0.91 0.99 0.97 0.90 0.94  CALCIUM 8.5*  --  8.7* 8.6* 8.9  MG  --   --   --   --  2.0   GFR: Estimated Creatinine Clearance: 44.3 mL/min (by C-G formula based on SCr of 0.94 mg/dL). Liver Function Tests: Recent Labs  Lab 01/09/21 1027  AST 20  ALT 27  ALKPHOS 103  BILITOT 1.1  PROT 7.4  ALBUMIN 4.0   No results for input(s): LIPASE, AMYLASE in the last 168 hours. No results for input(s): AMMONIA in the last 168 hours. Coagulation Profile: Recent Labs  Lab 01/09/21 1027  INR 1.1   Cardiac Enzymes: No results for input(s): CKTOTAL,  CKMB, CKMBINDEX, TROPONINI in the last 168 hours. BNP (last 3 results) No results for input(s): PROBNP in the last 8760 hours. HbA1C: No results for input(s): HGBA1C in the last 72 hours. CBG: Recent Labs  Lab 01/10/21 1120 01/10/21 1615 01/10/21 2034 01/11/21 0723 01/11/21 1201  GLUCAP 148* 115* 157* 143* 133*   Lipid Profile: No results for input(s): CHOL, HDL, LDLCALC, TRIG, CHOLHDL, LDLDIRECT in the last 72 hours. Thyroid Function Tests: No results for input(s): TSH, T4TOTAL, FREET4, T3FREE, THYROIDAB in the last 72 hours. Anemia Panel: No results for input(s): VITAMINB12, FOLATE, FERRITIN, TIBC, IRON, RETICCTPCT in the last 72 hours.    Radiology Studies: I have reviewed all of the imaging during this hospital visit personally  Scheduled Meds: . amoxicillin-clavulanate  1 tablet Oral Q12H  . carvedilol  6.25 mg Oral BID  . enoxaparin (LOVENOX) injection  40 mg Subcutaneous Q24H  . furosemide  40 mg Oral BID  . levothyroxine  112 mcg Oral QAC breakfast  . simvastatin  20 mg Oral Daily   Continuous Infusions:   LOS: 3 days        Sue [name] Annett Gula, MD

## 2021-01-12 NOTE — Progress Notes (Signed)
Physical Therapy Treatment Patient Details Name: Sue [name] MRN: [id number] DOB: 02-Aug-1930 Today's Date: 01/12/2021    History of Present Illness 87 y.o. female presented with complaints of generalized weakness.  Patient's son lives with her and apparently was concerned regarding progressive weakness where she could not walk with her walker or get up from the commode without help. Dx of PNA, acute on chronic CHF. Pt  with history h/o CHF, diabetes mellitus, hyperlipidemia, hypothyroidism who was a remote smoker.    PT Comments    Progressing with mobility. Pt continues to require assistance for safe mobility. O2 85% on RA during session. Replaced [place] O2 end of session. Recommend HHPT if pt declines placement.    Follow Up Recommendations  SNF (HHPT and 24 hour supervision/assist if pt declines placement)     Equipment Recommendations  None recommended by PT    Recommendations for Other Services       Precautions / Restrictions Precautions Precautions: Fall Precaution Comments: pt reports 2 falls in past 1 year Restrictions Weight Bearing Restrictions: No    Mobility  Bed Mobility Overal bed mobility: Needs Assistance Bed Mobility: Supine to Sit     Supine to sit: Min assist;HOB elevated     General bed mobility comments: Assist for trunk. Increased time. Cues required.    Transfers Overall transfer level: Needs assistance Equipment used: Rolling walker (2 wheeled) Transfers: Sit to/from Stand Sit to Stand: Min assist Stand pivot transfers: Min assist       General transfer comment: Assist to rise, steady, manage RW safely, control descent. Cues for safety, technique, hand placement. Stand pivot, bed to bsc, using RW  Ambulation/Gait Ambulation/Gait assistance: Min assist Gait Distance (Feet): 15 Feet Assistive device: Rolling walker (2 wheeled) Gait Pattern/deviations: Step-through pattern;Decreased stride length;Trunk flexed     General Gait Details:  Multimodal Cues for safety, posture, RW proximity. Assist to stabilize pt throughout short distance. O2 85% on RA, dyspnea 2/4   Stairs             Wheelchair Mobility    Modified Rankin (Stroke Patients Only)       Balance Overall balance assessment: Needs assistance;History of Falls         Standing balance support: Bilateral upper extremity supported Standing balance-Leahy Scale: Poor                              Cognition Arousal/Alertness: Awake/alert Behavior During Therapy: WFL for tasks assessed/performed Overall Cognitive Status: Within Functional Limits for tasks assessed                                        Exercises      General Comments        Pertinent Vitals/Pain Pain Assessment: No/denies pain    Home Living                      Prior Function            PT Goals (current goals can now be found in the care plan section) Progress towards PT goals: Progressing toward goals    Frequency    Min 2X/week      PT Plan Current plan remains appropriate    Co-evaluation              AM-PAC PT "  6 Clicks" Mobility   Outcome Measure  Help needed turning from your back to your side while in a flat bed without using bedrails?: A Little Help needed moving from lying on your back to sitting on the side of a flat bed without using bedrails?: A Little Help needed moving to and from a bed to a chair (including a wheelchair)?: A Little Help needed standing up from a chair using your arms (e.g., wheelchair or bedside chair)?: A Little Help needed to walk in hospital room?: A Little Help needed climbing 3-5 steps with a railing? : A Lot 6 Click Score: 17    End of Session Equipment Utilized During Treatment: Gait belt Activity Tolerance: Patient tolerated treatment well Patient left: in chair;with call bell/phone within reach;with chair alarm set   PT Visit Diagnosis: History of falling  (Z91.81);Difficulty in walking, not elsewhere classified (R26.2)     Time: 1829-9371 PT Time Calculation (min) (ACUTE ONLY): 18 min  Charges:  $Gait Training: 8-22 mins                         Faye Ramsay, PT Acute Rehabilitation  Office: [phone] Pager: [phone]

## 2021-01-13 DIAGNOSIS — J13 Pneumonia due to Streptococcus pneumoniae: Secondary | ICD-10-CM

## 2021-01-13 LAB — GLUCOSE, CAPILLARY
Glucose-Capillary: 126 mg/dL — ABNORMAL HIGH (ref 70–99)
Glucose-Capillary: 142 mg/dL — ABNORMAL HIGH (ref 70–99)

## 2021-01-13 LAB — BASIC METABOLIC PANEL
Anion gap: 11 (ref 5–15)
BUN: 19 mg/dL (ref 8–23)
CO2: 25 mmol/L (ref 22–32)
Calcium: 9 mg/dL (ref 8.9–10.3)
Chloride: 101 mmol/L (ref 98–111)
Creatinine, Ser: 0.7 mg/dL (ref 0.44–1.00)
GFR, Estimated: >60 mL/min (ref >60)
Glucose, Bld: 121 mg/dL — ABNORMAL HIGH (ref 70–99)
Potassium: 3.7 mmol/L (ref 3.5–5.1)
Sodium: 137 mmol/L (ref 135–145)

## 2021-01-13 MED ORDER — AMOXICILLIN-POT CLAVULANATE 875-125 MG PO TABS: 1.0000 | ORAL_TABLET | Freq: Two times a day (BID) | ORAL | 0 refills | Status: AC

## 2021-01-13 NOTE — Discharge Instructions (Signed)
Community-Acquired Pneumonia, Adult Pneumonia is an infection of the lungs. It causes irritation and swelling in the airways of the lungs. Mucus and fluid may also build up inside the airways. This may cause coughing and trouble breathing. One type of pneumonia can happen while you are in a hospital. A different type can happen when you are not in a hospital (community-acquired pneumonia). What are the causes? This condition is caused by germs (viruses, bacteria, or fungi). Some types of germs can spread from person to person. Pneumonia is not thought to spread from person to person.   What increases the risk? You are more likely to develop this condition if:  You have a long-term (chronic) disease, such as: ? Disease of the lungs. This may be chronic obstructive pulmonary disease (COPD) or asthma. ? Heart failure. ? Cystic fibrosis. ? Diabetes. ? Kidney disease. ? Sickle cell disease. ? HIV.  You have other health problems, such as: ? Your body's defense system (immune system) is weak. ? A condition that may cause you to breathe in fluids from your mouth and nose.  You had your spleen taken out.  You do not take good care of your teeth and mouth (poor dental hygiene).  You use or have used tobacco products.  You travel where the germs that cause this illness are common.  You are near certain animals or the places they live.  You are older than 85 years of age. What are the signs or symptoms? Symptoms of this condition include:  A cough.  A fever.  Sweating or chills.  Chest pain, often when you breathe deeply or cough.  Breathing problems, such as: ? Fast breathing. ? Trouble breathing. ? Shortness of breath.  Feeling tired (fatigued).  Muscle aches. How is this treated? Treatment for this condition depends on many things, such as:  The cause of your illness.  Your medicines.  Your other health problems. Most adults can be treated at home. Sometimes,  treatment must happen in a hospital.  Treatment may include medicines to kill germs.  Medicines may depend on which germ caused your illness. Very bad pneumonia is rare. If you get it, you may:  Have a machine to help you breathe.  Have fluid taken away from around your lungs. Follow these instructions at home: Medicines  Take over-the-counter and prescription medicines only as told by your doctor.  Take cough medicine only if you are losing sleep. Cough medicine can keep your body from taking mucus away from your lungs.  If you were prescribed an antibiotic medicine, take it as told by your doctor. Do not stop taking the antibiotic even if you start to feel better. Lifestyle  Do not drink alcohol.  Do not use any products that contain nicotine or tobacco, such as cigarettes, e-cigarettes, and chewing tobacco. If you need help quitting, ask your doctor.  Eat a healthy diet. This includes a lot of vegetables, fruits, whole grains, low-fat dairy products, and low-fat (lean) protein.      General instructions  Rest a lot. Sleep for at least 8 hours each night.  Sleep with your head and neck raised. Put a few pillows under your head or sleep in a reclining chair.  Return to your normal activities as told by your doctor. Ask your doctor what activities are safe for you.  Drink enough fluid to keep your pee (urine) pale yellow.  If your throat is sore, rinse your mouth often with salt water. To make salt   water, dissolve -1 tsp (3-6 g) of salt in 1 cup (237 mL) of warm water.  Keep all follow-up visits as told by your doctor. This is important.   How is this prevented? You can lower your risk of pneumonia by:  Getting the pneumonia shot (vaccine). These shots have different types and schedules. Ask your doctor what works best for you. Think about getting this shot if: ? You are older than 85 years of age. ? You are 19-65 years of age and:  You are being treated for  cancer.  You have long-term lung disease.  You have other problems that affect your body's defense system. Ask your doctor if you have one of these.  Getting your flu shot every year. Ask your doctor which type of shot is best for you.  Going to the dentist as often as told.  Washing your hands often with soap and water for at least 20 seconds. If you cannot use soap and water, use hand sanitizer. Contact a doctor if:  You have a fever.  You lose sleep because your cough medicine does not help. Get help right away if:  You are short of breath and this gets worse.  You have more chest pain.  Your sickness gets worse. This is very serious if: ? You are an older adult. ? Your body's defense system is weak.  You cough up blood. These symptoms may be an emergency. Do not wait to see if the symptoms will go away. Get medical help right away. Call your local emergency services (911 in the [place] Do not drive yourself to the hospital. Summary  Pneumonia is an infection of the lungs.  Community-acquired pneumonia affects people who have not been in the hospital. Certain germs can cause this infection.  This condition may be treated with medicines that kill germs.  For very bad pneumonia, you may need a hospital stay and treatment to help with breathing. This information is not intended to replace advice given to you by your health care provider. Make sure you discuss any questions you have with your health care provider. Document Revised: 06/05/2019 Document Reviewed: 06/05/2019 [employer] Patient Education  2021 [employer]  

## 2021-01-13 NOTE — Discharge Summary (Signed)
Physician Discharge Summary  Sue [name] ZOX:[id number] DOB: 19-Mar-1930 DOA: 01/09/2021  PCP: Sigmund Hazel, MD  Admit date: 01/09/2021 Discharge date: 01/13/2021  Admitted From: Home  Disposition:  Home   Recommendations for Outpatient Follow-up and new medication changes:  1. Follow up with Dr Hyacinth Meeker in 7 to 10 days.  2. Continue antibiotic therapy with Augmentin for 2 more days. 3. Continue furosemide 40 mg bid.  4. Follow-up kidney function as an outpatient.  I spoke over the phone with the patient's son about patient's  condition, plan of care, prognosis and all questions were addressed.  Home Health: yes   Equipment/Devices: na    Discharge Condition: stable  CODE STATUS: full  Diet recommendation:  Heart healthy and diabetic prudent.   Brief/Interim Summary: Sue [name] was admitted to the hospital with the working diagnosis ofacute hypoxic respiratory failure due to community-acquired pneumonia, and acute pulmonary edema.  87 year old female past medical history for heart failure, type II diabetes mellitus, dyslipidemia and hypothyroidism who presents with generalized weakness. At home patient was noted to have progressive weakness, decreased physical functional capacity,that prompted her to be brought to the hospital. On her initial physical examination she was febrile 102 F, she was tachypneic, respiratory rate 30-40, oxygen saturation 88%, blood pressure 120/85, heart rate 84. She had increased work of breathing, no wheezing or rhonchi, heart S1-S2, present, rhythmic, soft abdomen, trace bilateral lower extremity edema.  Sodium 135, potassium 3.0, chloride 98, bicarb 23, glucose 207, BUN 17, creatinine 0.91, white count 18.5, hemoglobin 16.8, hematocrit 50.5, platelets 464. SARS COVID-19 negative. Urinalysis specific gravity 1.013, 11-20 red cells,>50 white cells.  Head CT with atrophy, small vessel chronic ischemic changes, no acute abnormalities. Chest radiograph  with increased vascular interstitial markings bilaterally, bibasilar opacities, retrocardiac opacity.  EKG 84 bpm, normal axis, right bundle branch block, sinus rhythm with PACs, no significant ST segment or T wave changes.  Patient was placed on antibiotic therapy and IV diuresis with improvement in oxygenation. Continue to be very weak and deconditioned, patient declined SNF.    1.  Acute hypoxemic respiratory failure due to community-acquired pneumonia, bilateral lower lobes.  Patient was admitted to the medical ward, she was placed on telemetry monitor, received intravenous antibiotic therapy with ceftriaxone with good toleration. Her blood cultures were no growth, her leukocytosis improved.  Patient was transitioned to oral Augmentin with good toleration, will need 2 more days of therapy. Her discharge oxygenation 94% at rest, down to 85% on room air during ambulation.  Will discharge patient with home oxygen.  2.  Acute on chronic diastolic heart failure exacerbation, acute pulmonary edema.  Patient underwent diuresis with good toleration, negative fluid balance was achieved. Her urine output over the last 24 hours has been 1300 mL. Further work-up with echocardiography showed LV systolic function 65 to 70% no significant valvular disease.  Continue furosemide 40 mg twice daily, and carvedilol.  3.  Type 2 diabetes mellitus, dyslipidemia.  Her glucose remained stable during her hospitalization. Continue statin therapy.  4.  Hypothyroidism.  Continue levothyroxine.  5.  Obesity class I, depression.  Calculated BMI 34.3, continue clonazepam.  6.  Diuresis induced hypokalemia.  Patient received potassium chloride with good toleration, her kidney function remained stable. At discharge sodium 137, potassium 3.7, chloride 1 1, bicarb 25, glucose 121, BUN 19, creatinine 0.70. Follow-up kidney function as an outpatient. Continue home KCl supplementation.    Discharge Diagnoses:   Principal Problem:   Acute respiratory failure with hypoxemia (  HCC) Active Problems:   Diabetes (HCC)   Pneumonia   Essential hypertension   Hyperlipidemia   Hypothyroidism   Acute on chronic diastolic CHF (congestive heart failure) (HCC)    Discharge Instructions   Allergies as of 01/13/2021   No Known Allergies     Medication List    TAKE these medications   acetaminophen 500 MG tablet Commonly known as: TYLENOL Take 1,000 mg by mouth every 6 (six) hours as needed for mild pain.   amoxicillin-clavulanate 875-125 MG tablet Commonly known as: AUGMENTIN Take 1 tablet by mouth every 12 (twelve) hours for 2 days.   carvedilol 3.125 MG tablet Commonly known as: COREG TAKE TWO TABLETS BY MOUTH TWICE A DAY What changed:   how much to take  how to take this  when to take this   clonazePAM 0.5 MG tablet Commonly known as: KLONOPIN Take 0.25-0.5 mg by mouth 2 (two) times daily as needed for anxiety.   furosemide 20 MG tablet Commonly known as: LASIX Take 40 mg by mouth 2 (two) times daily.   levothyroxine 112 MCG tablet Commonly known as: SYNTHROID Take 112 mcg by mouth daily before breakfast.   nitrofurantoin (macrocrystal-monohydrate) 100 MG capsule Commonly known as: MACROBID Take 100 mg by mouth 2 (two) times daily.   potassium chloride 10 MEQ tablet Commonly known as: KLOR-CON Take 10 mEq by mouth 2 (two) times daily.   simvastatin 20 MG tablet Commonly known as: ZOCOR Take 20 mg by mouth daily.       No Known Allergies    Procedures/Studies: CT HEAD WO CONTRAST  Result Date: 01/09/2021 CLINICAL DATA:  Shortness of breath, weakness, focal neurological deficit for more than 6 hours suspected stroke, history CHF, diabetes mellitus EXAM: CT HEAD WITHOUT CONTRAST TECHNIQUE: Contiguous axial images were obtained from the base of the skull through the vertex without intravenous contrast. Sagittal and coronal MPR images reconstructed from axial data  set. COMPARISON:  None FINDINGS: Brain: Generalized atrophy. Normal ventricular morphology. No midline shift or mass effect. Otherwise normal appearance of brain parenchyma. No intracranial hemorrhage, mass lesion, or evidence of acute infarction. No extra-axial fluid collections. Vascular: No hyperdense vessels. Minimally prominent basilar tip 5 mm diameter. Atherosclerotic calcifications of internal carotid arteries at skull base Skull: Demineralized Sinuses/Orbits: Clear Other: N/A IMPRESSION: Atrophy with small vessel chronic ischemic changes of deep cerebral white matter. No acute intracranial abnormalities. Minimally prominent tip of basilar artery measuring 5 mm diameter. Electronically Signed   By: Ulyses Southward M.D.   On: 01/09/2021 12:46   DG Chest Portable 1 View  Result Date: 01/09/2021 CLINICAL DATA:  Weakness, shortness of breath EXAM: PORTABLE CHEST 1 VIEW COMPARISON:  06/02/2018 FINDINGS: Patchy left lower lobe opacity, suspicious for pneumonia. Mild patchy right lower lobe opacity, atelectasis versus pneumonia. No definite pleural effusions. No frank interstitial edema. No pneumothorax. The heart is normal in size.  Mild thoracic aortic atherosclerosis. IMPRESSION: Patchy left lower lobe opacity, suspicious for pneumonia. Mild patchy right lower lobe opacity, atelectasis versus pneumonia. Electronically Signed   By: Charline Bills M.D.   On: 01/09/2021 10:46   ECHOCARDIOGRAM COMPLETE  Result Date: 01/12/2021    ECHOCARDIOGRAM REPORT   Patient Name:   ANIELLA WANDREY Date of Exam: 01/12/2021 Medical Rec #:  [id number]       Height:       64.0 in Accession #:    [id number]      Weight:       200.0 lb Date  of Birth:  1930/03/10       BSA:          1.956 m Patient Age:    87 years        BP:           122/59 mmHg Patient Gender: F               HR:           57 bpm. Exam Location:  Inpatient Procedure: 2D Echo, Cardiac Doppler and Color Doppler Indications:    Dyspnea  History:        Patient has  prior history of Echocardiogram examinations, most                 recent 02/28/2018. CHF, CAD; Risk Factors:Dyslipidemia and                 Diabetes.  Sonographer:    Neomia DearAMARA CROWN RDCS Referring Phys: [id number] Brieanne Mignone DANIEL Karo Rog IMPRESSIONS  1. Left ventricular ejection fraction, by estimation, is 65 to 70%. The left ventricle has normal function. The left ventricle has no regional wall motion abnormalities. Left ventricular diastolic parameters are indeterminate.  2. Right ventricular systolic function is normal. The right ventricular size is normal.  3. Trivial mitral valve regurgitation.  4. Aortic valve regurgitation is not visualized. Mild aortic valve sclerosis is present, with no evidence of aortic valve stenosis.  5. The inferior vena cava is normal in size with greater than 50% respiratory variability, suggesting right atrial pressure of 3 mmHg. Comparison(s): The left ventricular function has improved. FINDINGS  Left Ventricle: Left ventricular ejection fraction, by estimation, is 65 to 70%. The left ventricle has normal function. The left ventricle has no regional wall motion abnormalities. The left ventricular internal cavity size was normal in size. There is  no left ventricular hypertrophy. Left ventricular diastolic parameters are indeterminate. Right Ventricle: The right ventricular size is normal. Right vetricular wall thickness was not assessed. Right ventricular systolic function is normal. Left Atrium: Left atrial size was normal in size. Right Atrium: Right atrial size was normal in size. Pericardium: There is no evidence of pericardial effusion. Mitral Valve: There is mild thickening of the mitral valve leaflet(s). Mild mitral annular calcification. Trivial mitral valve regurgitation. MV peak gradient, 4.1 mmHg. The mean mitral valve gradient is 1.0 mmHg. Tricuspid Valve: The tricuspid valve is normal in structure. Tricuspid valve regurgitation is trivial. Aortic Valve: Aortic valve  regurgitation is not visualized. Mild aortic valve sclerosis is present, with no evidence of aortic valve stenosis. Aortic valve mean gradient measures 6.0 mmHg. Aortic valve peak gradient measures 10.8 mmHg. Aortic valve area,  by VTI measures 1.51 cm. Pulmonic Valve: The pulmonic valve was normal in structure. Pulmonic valve regurgitation is mild. Aorta: The aortic root and ascending aorta are structurally normal, with no evidence of dilitation. Venous: The inferior vena cava is normal in size with greater than 50% respiratory variability, suggesting right atrial pressure of 3 mmHg. IAS/Shunts: No atrial level shunt detected by color flow Doppler.  LEFT VENTRICLE PLAX 2D LVIDd:         4.50 cm     Diastology LVIDs:         1.70 cm     LV e' medial:    3.89 cm/s LV PW:         0.90 cm     LV E/e' medial:  15.2 LV IVS:        0.90 cm  LV e' lateral:   6.00 cm/s LVOT diam:     1.80 cm     LV E/e' lateral: 9.9 LV SV:         48 LV SV Index:   25 LVOT Area:     2.54 cm  LV Volumes (MOD) LV vol d, MOD A2C: 51.5 ml LV vol d, MOD A4C: 25.0 ml LV vol s, MOD A2C: 17.5 ml LV vol s, MOD A4C: 17.8 ml LV SV MOD A2C:     34.0 ml LV SV MOD A4C:     25.0 ml LV SV MOD BP:      19.1 ml RIGHT VENTRICLE RV S prime:     9.57 cm/s TAPSE (M-mode): 1.9 cm LEFT ATRIUM           Index       RIGHT ATRIUM           Index LA Vol (A2C): 33.0 ml 16.87 ml/m RA Area:     11.10 cm LA Vol (A4C): 20.2 ml 10.33 ml/m RA Volume:   23.90 ml  12.22 ml/m  AORTIC VALVE                    PULMONIC VALVE AV Area (Vmax):    1.52 cm     PV Vmax:       0.86 m/s AV Area (Vmean):   1.55 cm     PV Vmean:      58.600 cm/s AV Area (VTI):     1.51 cm     PV VTI:        0.133 m AV Vmax:           164.00 cm/s  PV Peak grad:  3.0 mmHg AV Vmean:          112.000 cm/s PV Mean grad:  2.0 mmHg AV VTI:            0.319 m AV Peak Grad:      10.8 mmHg AV Mean Grad:      6.0 mmHg LVOT Vmax:         97.90 cm/s LVOT Vmean:        68.200 cm/s LVOT VTI:          0.189 m  LVOT/AV VTI ratio: 0.59  AORTA Ao Root diam: 3.00 cm Ao Asc diam:  3.30 cm MITRAL VALVE MV Area (PHT): 1.92 cm    SHUNTS MV Area VTI:   1.91 cm    Systemic VTI:  0.19 m MV Peak grad:  4.1 mmHg    Systemic Diam: 1.80 cm MV Mean grad:  1.0 mmHg MV Vmax:       1.01 m/s MV Vmean:      52.9 cm/s MV Decel Time: 396 msec MV E velocity: 59.20 cm/s MV A velocity: 93.60 cm/s MV E/A ratio:  0.63 Dietrich Pates MD Electronically signed by Dietrich Pates MD Signature Date/Time: 01/12/2021/3:59:25 PM    Final      Subjective: Patient is feeling better, no nausea or vomiting, no chest pain or dyspnea. Continue to feel weak but better than on admission, continue to decline SNF.   Discharge Exam: Vitals:   01/12/21 2114 01/13/21 0610  BP: 139/83 126/77  Pulse: 75 71  Resp: 20 14  Temp: 98.1 F (36.7 C) 98.2 F (36.8 C)  SpO2: 96% 94%   Vitals:   01/12/21 0516 01/12/21 1323 01/12/21 2114 01/13/21 0610  BP: (!) 122/59 130/76 139/83 126/77  Pulse:  63 65 75 71  Resp: Temp: 98 F (36.7 C) 98.7 F (37.1 C) 98.1 F (36.7 C) 98.2 F (36.8 C)  TempSrc: Oral  Oral Oral  SpO2: 93% 96% 96% 94%  Weight:      Height:        General: Not in pain or dyspnea  Neurology: Awake and alert, non focal  E ENT: mild pallor, no icterus, oral mucosa moist Cardiovascular: No JVD. S1-S2 present, rhythmic, no gallops, rubs, or murmurs. No lower extremity edema. Pulmonary: positive breath sounds bilaterally, with no wheezing, rhonchi or rales. Anterior auscultation.  Gastrointestinal. Abdomen soft and non tender Skin. No rashes Musculoskeletal: no joint deformities   The results of significant diagnostics from this hospitalization (including imaging, microbiology, ancillary and laboratory) are listed below for reference.     Microbiology: Recent Results (from the past 240 hour(s))  Resp Panel by RT-PCR (Flu A&B, Covid) Nasopharyngeal Swab     Status: None   Collection Time: 01/09/21 10:27 AM   Specimen:  Nasopharyngeal Swab; Nasopharyngeal(NP) swabs in vial transport medium  Result Value Ref Range Status   SARS Coronavirus 2 by RT PCR NEGATIVE NEGATIVE Final    Comment: (NOTE) SARS-CoV-2 target nucleic acids are NOT DETECTED.  The SARS-CoV-2 RNA is generally detectable in upper respiratory specimens during the acute phase of infection. The lowest concentration of SARS-CoV-2 viral copies this assay can detect is 138 copies/mL. A negative result does not preclude SARS-Cov-2 infection and should not be used as the sole basis for treatment or other patient management decisions. A negative result may occur with  improper specimen collection/handling, submission of specimen other than nasopharyngeal swab, presence of viral mutation(s) within the areas targeted by this assay, and inadequate number of viral copies(<138 copies/mL). A negative result must be combined with clinical observations, patient history, and epidemiological information. The expected result is Negative.  Fact Sheet for Patients:  BloggerCourse.com  Fact Sheet for Healthcare Providers:  SeriousBroker.it  This test is no t yet approved or cleared by the [place] FDA and  has been authorized for detection and/or diagnosis of SARS-CoV-2 by FDA under an Emergency Use Authorization (EUA). This EUA will remain  in effect (meaning this test can be used) for the duration of the COVID-19 declaration under Section 564(b)(1) of the Act, 21 U.S.C.section 360bbb-3(b)(1), unless the authorization is terminated  or revoked sooner.       Influenza A by PCR NEGATIVE NEGATIVE Final   Influenza B by PCR NEGATIVE NEGATIVE Final    Comment: (NOTE) The Xpert Xpress SARS-CoV-2/FLU/RSV plus assay is intended as an aid in the diagnosis of influenza from Nasopharyngeal swab specimens and should not be used as a sole basis for treatment. Nasal washings and aspirates are unacceptable for  Xpert Xpress SARS-CoV-2/FLU/RSV testing.  Fact Sheet for Patients: BloggerCourse.com  Fact Sheet for Healthcare Providers: SeriousBroker.it  This test is not yet approved or cleared by the [place] FDA and has been authorized for detection and/or diagnosis of SARS-CoV-2 by FDA under an Emergency Use Authorization (EUA). This EUA will remain in effect (meaning this test can be used) for the duration of the COVID-19 declaration under Section 564(b)(1) of the Act, 21 U.S.C. section 360bbb-3(b)(1), unless the authorization is terminated or revoked.  Performed at [facility], [address], [place], [place] [address]   Blood culture (routine x 2)     Status: None (Preliminary result)   Collection Time: 01/09/21 11:41 AM  Specimen: BLOOD  Result Value Ref Range Status   Specimen Description   Final    BLOOD RIGHT ANTECUBITAL Performed at [facility], [address] [place], [place] [address]    Special Requests   Final    BOTTLES DRAWN AEROBIC AND ANAEROBIC Blood Culture adequate volume Performed at [facility], [address] [place], [place] [address]    Culture   Final    NO GROWTH 4 DAYS Performed at [facility], [address], [place], [place] [address]    Report Status PENDING  Incomplete     Labs: BNP (last 3 results) Recent Labs    01/09/21 1027  BNP 57.0   Basic Metabolic Panel: Recent Labs  Lab 01/09/21 1027 01/09/21 1405 01/10/21 0559 01/11/21 0654 01/12/21 0521 01/13/21 0550  NA 135  --  137 137 140 137  K 3.0*  --  3.0* 2.8* 3.5 3.7  CL 98  --  102 102 103 101  CO2 23  --  25 26 29 25   GLUCOSE 207*  --  127* 127* 120* 121*  BUN 17  --  14 18 19 19   CREATININE 0.91 0.99 0.97 0.90 0.94 0.70  CALCIUM 8.5*  --  8.7* 8.6* 8.9 9.0  MG  --   --   --   --  2.0  --    Liver Function Tests: Recent Labs  Lab 01/09/21 1027  AST  20  ALT 27  ALKPHOS 103  BILITOT 1.1  PROT 7.4  ALBUMIN 4.0   No results for input(s): LIPASE, AMYLASE in the last 168 hours. No results for input(s): AMMONIA in the last 168 hours. CBC: Recent Labs  Lab 01/09/21 1027 01/09/21 1405 01/10/21 0559 01/11/21 0654 01/12/21 0521  WBC 18.5* 18.7* 11.3* 11.3* 9.7  NEUTROABS 16.5*  --   --  7.9* 6.3  HGB 16.8* 15.1* 14.4 13.9 15.1*  HCT 50.5* 45.1 44.1 42.9 45.9  MCV 96.6 95.3 96.1 97.5 95.8  PLT 464* 465* 336 458* 436*   Cardiac Enzymes: No results for input(s): CKTOTAL, CKMB, CKMBINDEX, TROPONINI in the last 168 hours. BNP: Invalid input(s): POCBNP CBG: Recent Labs  Lab 01/10/21 1615 01/10/21 2034 01/11/21 0723 01/11/21 1201 01/13/21 0752  GLUCAP 115* 157* 143* 133* 126*   D-Dimer No results for input(s): DDIMER in the last 72 hours. Hgb A1c No results for input(s): HGBA1C in the last 72 hours. Lipid Profile No results for input(s): CHOL, HDL, LDLCALC, TRIG, CHOLHDL, LDLDIRECT in the last 72 hours. Thyroid function studies No results for input(s): TSH, T4TOTAL, T3FREE, THYROIDAB in the last 72 hours.  Invalid input(s): FREET3 Anemia work up No results for input(s): VITAMINB12, FOLATE, FERRITIN, TIBC, IRON, RETICCTPCT in the last 72 hours. Urinalysis    Component Value Date/Time   COLORURINE YELLOW 01/09/2021 1410   APPEARANCEUR CLOUDY (A) 01/09/2021 1410   LABSPEC 1.013 01/09/2021 1410   PHURINE 6.0 01/09/2021 1410   GLUCOSEU NEGATIVE 01/09/2021 1410   HGBUR SMALL (A) 01/09/2021 1410   BILIRUBINUR NEGATIVE 01/09/2021 1410   KETONESUR NEGATIVE 01/09/2021 1410   PROTEINUR 30 (A) 01/09/2021 1410   NITRITE NEGATIVE 01/09/2021 1410   LEUKOCYTESUR LARGE (A) 01/09/2021 1410   Sepsis Labs Invalid input(s): PROCALCITONIN,  WBC,  LACTICIDVEN Microbiology Recent Results (from the past 240 hour(s))  Resp Panel by RT-PCR (Flu A&B, Covid) Nasopharyngeal Swab     Status: None   Collection Time: 01/09/21 10:27 AM    Specimen: Nasopharyngeal Swab;  Nasopharyngeal(NP) swabs in vial transport medium  Result Value Ref Range Status   SARS Coronavirus 2 by RT PCR NEGATIVE NEGATIVE Final    Comment: (NOTE) SARS-CoV-2 target nucleic acids are NOT DETECTED.  The SARS-CoV-2 RNA is generally detectable in upper respiratory specimens during the acute phase of infection. The lowest concentration of SARS-CoV-2 viral copies this assay can detect is 138 copies/mL. A negative result does not preclude SARS-Cov-2 infection and should not be used as the sole basis for treatment or other patient management decisions. A negative result may occur with  improper specimen collection/handling, submission of specimen other than nasopharyngeal swab, presence of viral mutation(s) within the areas targeted by this assay, and inadequate number of viral copies(<138 copies/mL). A negative result must be combined with clinical observations, patient history, and epidemiological information. The expected result is Negative.  Fact Sheet for Patients:  BloggerCourse.com  Fact Sheet for Healthcare Providers:  SeriousBroker.it  This test is no t yet approved or cleared by the [place] FDA and  has been authorized for detection and/or diagnosis of SARS-CoV-2 by FDA under an Emergency Use Authorization (EUA). This EUA will remain  in effect (meaning this test can be used) for the duration of the COVID-19 declaration under Section 564(b)(1) of the Act, 21 U.S.C.section 360bbb-3(b)(1), unless the authorization is terminated  or revoked sooner.       Influenza A by PCR NEGATIVE NEGATIVE Final   Influenza B by PCR NEGATIVE NEGATIVE Final    Comment: (NOTE) The Xpert Xpress SARS-CoV-2/FLU/RSV plus assay is intended as an aid in the diagnosis of influenza from Nasopharyngeal swab specimens and should not be used as a sole basis for treatment. Nasal washings and aspirates are  unacceptable for Xpert Xpress SARS-CoV-2/FLU/RSV testing.  Fact Sheet for Patients: BloggerCourse.com  Fact Sheet for Healthcare Providers: SeriousBroker.it  This test is not yet approved or cleared by the [place] FDA and has been authorized for detection and/or diagnosis of SARS-CoV-2 by FDA under an Emergency Use Authorization (EUA). This EUA will remain in effect (meaning this test can be used) for the duration of the COVID-19 declaration under Section 564(b)(1) of the Act, 21 U.S.C. section 360bbb-3(b)(1), unless the authorization is terminated or revoked.  Performed at [facility] [address], [place], [place] [address]   Blood culture (routine x 2)     Status: None (Preliminary result)   Collection Time: 01/09/21 11:41 AM   Specimen: BLOOD  Result Value Ref Range Status   Specimen Description   Final    BLOOD RIGHT ANTECUBITAL Performed at [facility], [address] [place], [place] [address]    Special Requests   Final    BOTTLES DRAWN AEROBIC AND ANAEROBIC Blood Culture adequate volume Performed at [facility], [address] [place], [place] [address]    Culture   Final    NO GROWTH 4 DAYS Performed at [facility], [address] [place], [place] [address]    Report Status PENDING  Incomplete     Time coordinating discharge: 45 minutes  SIGNED:   Coralie Keens, MD  [facility] 01/13/2021, 11:32 AM

## 2021-01-13 NOTE — Progress Notes (Signed)
SATURATION QUALIFICATIONS: (This note is used to comply with regulatory documentation for home oxygen)  Patient Saturations on Room Air at Rest = 94%  Patient Saturations on Room Air while Ambulating = 96%  Patient Saturations on Liters of oxygen while Ambulating = ambulated on room air  Please briefly explain why patient needs home oxygen: no oxygen needed

## 2021-01-13 NOTE — TOC Initial Note (Signed)
Transition of Care [facility]) - Initial/Assessment Note    Patient Details  Name: Sue [name] MRN: [id number] Date of Birth: [birth year]  Transition of Care [facility]) CM/SW Contact:    Iyonnah Ferrante, Meriam Sprague, RN Phone Number: 01/13/2021, 2:57 PM  Clinical Narrative:                 Pt from home with son and they plan for her to go back there instead of SNF at [place]. Per desat screen, home 02 is not needed at this time. Home health services set up with [facility] ([facility]).  Expected Discharge Plan: Home w Home Health Services Barriers to Discharge: No Barriers Identified   Patient Goals and CMS Choice Patient states their goals for this hospitalization and ongoing recovery are:: To go home CMS Medicare.gov Compare Post Acute Care list provided to:: Patient Choice offered to / list presented to : Patient  Expected Discharge Plan and Services Expected Discharge Plan: Home w Home Health Services   Discharge Planning Services: CM Consult Post Acute Care Choice: Home Health Living arrangements for the past 2 months: Apartment Expected Discharge Date: 01/13/21                         HH Arranged: PT,OT HH Agency: [facility] Date HH Agency Contacted: 01/13/21 Time HH Agency Contacted: 1456 Representative spoke with at [facility] Agency: Kathlene November  Prior Living Arrangements/Services Living arrangements for the past 2 months: Apartment Lives with:: Adult Children Patient language and need for interpreter reviewed:: Yes Do you feel safe going back to the place where you live?: Yes      Need for Family Participation in Patient Care: Yes (Comment) Care giver support system in place?: Yes (comment) Current home services: Home OT,Home PT Criminal Activity/Legal Involvement Pertinent to Current Situation/Hospitalization: No - Comment as needed  Activities of Daily Living Home Assistive Devices/Equipment: Dentures (specify type),Eyeglasses,Walker (specify type)  (upper denture, front wheeled walker) ADL Screening (condition at time of admission) Patient's cognitive ability adequate to safely complete daily activities?: Yes Is the patient deaf or have difficulty hearing?: Yes (slight hoh) Does the patient have difficulty seeing, even when wearing glasses/contacts?: No Does the patient have difficulty concentrating, remembering, or making decisions?: Yes Patient able to express need for assistance with ADLs?: Yes Does the patient have difficulty dressing or bathing?: Yes Independently performs ADLs?: No Communication: Independent Dressing (OT): Needs assistance Is this a change from baseline?: Change from baseline, expected to last >3 days Grooming: Needs assistance Is this a change from baseline?: Change from baseline, expected to last >3 days Feeding: Needs assistance Is this a change from baseline?: Change from baseline, expected to last >3 days Bathing: Needs assistance Is this a change from baseline?: Change from baseline, expected to last >3 days Toileting: Needs assistance Is this a change from baseline?: Change from baseline, expected to last >3days In/Out Bed: Needs assistance Is this a change from baseline?: Change from baseline, expected to last >3 days Walks in Home: Needs assistance Is this a change from baseline?: Change from baseline, expected to last >3 days Does the patient have difficulty walking or climbing stairs?: Yes (secondary to weakness) Weakness of Legs: Both Weakness of Arms/Hands: None  Permission Sought/Granted Permission sought to share information with : Facility Industrial/product designer granted to share information with : Yes, Verbal Permission Granted     Permission granted to share info w AGENCY: [facility] ([facility])        Emotional Assessment Appearance:: Appears stated age Attitude/Demeanor/Rapport: Gracious Affect (typically observed): Calm Orientation: : Oriented to Self,Oriented  to Place,Oriented to  Time,Oriented to Situation Alcohol / Substance Use: Not Applicable Psych Involvement: No (comment)  Admission diagnosis:  Pneumonia [J18.9] Community acquired pneumonia, unspecified laterality [J18.9] Patient Active Problem List   Diagnosis Date Noted  . Acute on chronic diastolic CHF (congestive heart failure) (HCC) 01/10/2021  . Acute respiratory failure with hypoxemia (HCC) 01/10/2021  . Pneumonia 01/09/2021  . Osteoarthritis of ankle and foot 01/09/2021  . Chronic combined systolic and diastolic heart failure (HCC) 01/09/2021  . Chronic fatigue syndrome 01/09/2021  . Essential hypertension 01/09/2021  . Hyperlipidemia 01/09/2021  . Hypothyroidism 01/09/2021  . Diabetes (HCC) 04/14/2020   PCP:  Sigmund Hazel, MD Pharmacy:   [facility] [place], [place] - [address] [place] [address] Phone: [phone] Fax: [phone]     Social Determinants of Health (SDOH) Interventions    Readmission Risk Interventions No flowsheet data found.

## 2021-01-14 LAB — CULTURE, BLOOD (ROUTINE X 2)
Culture: NO GROWTH
Special Requests: ADEQUATE

## 2021-01-25 ENCOUNTER — Inpatient Hospital Stay (HOSPITAL_COMMUNITY): Payer: HMO

## 2021-01-25 ENCOUNTER — Emergency Department (HOSPITAL_COMMUNITY): Payer: HMO

## 2021-01-25 ENCOUNTER — Inpatient Hospital Stay (HOSPITAL_COMMUNITY)
Admission: EM | Admit: 2021-01-25 | Discharge: 2021-02-01 | DRG: 041 | Disposition: A | Discharge status: Rehabilitation Facility | Payer: HMO | Attending: Neurology | Admitting: Neurology

## 2021-01-25 ENCOUNTER — Other Ambulatory Visit: Payer: Self-pay

## 2021-01-25 ENCOUNTER — Encounter (HOSPITAL_COMMUNITY): Payer: Self-pay | Admitting: Student in an Organized Health Care Education/Training Program

## 2021-01-25 DIAGNOSIS — E1169 Type 2 diabetes mellitus with other specified complication: Secondary | ICD-10-CM

## 2021-01-25 DIAGNOSIS — Z833 Family history of diabetes mellitus: Secondary | ICD-10-CM

## 2021-01-25 DIAGNOSIS — I509 Heart failure, unspecified: Secondary | ICD-10-CM | POA: Diagnosis not present

## 2021-01-25 DIAGNOSIS — I6300 Cerebral infarction due to thrombosis of unspecified precerebral artery: Secondary | ICD-10-CM | POA: Diagnosis not present

## 2021-01-25 DIAGNOSIS — Z87891 Personal history of nicotine dependence: Secondary | ICD-10-CM

## 2021-01-25 DIAGNOSIS — K5901 Slow transit constipation: Secondary | ICD-10-CM | POA: Diagnosis not present

## 2021-01-25 DIAGNOSIS — I63413 Cerebral infarction due to embolism of bilateral middle cerebral arteries: Secondary | ICD-10-CM | POA: Diagnosis present

## 2021-01-25 DIAGNOSIS — G43909 Migraine, unspecified, not intractable, without status migrainosus: Secondary | ICD-10-CM | POA: Diagnosis present

## 2021-01-25 DIAGNOSIS — Z79899 Other long term (current) drug therapy: Secondary | ICD-10-CM

## 2021-01-25 DIAGNOSIS — Z20822 Contact with and (suspected) exposure to covid-19: Secondary | ICD-10-CM | POA: Diagnosis present

## 2021-01-25 DIAGNOSIS — H9192 Unspecified hearing loss, left ear: Secondary | ICD-10-CM | POA: Diagnosis present

## 2021-01-25 DIAGNOSIS — R410 Disorientation, unspecified: Secondary | ICD-10-CM | POA: Diagnosis not present

## 2021-01-25 DIAGNOSIS — E669 Obesity, unspecified: Secondary | ICD-10-CM | POA: Diagnosis present

## 2021-01-25 DIAGNOSIS — Z79890 Hormone replacement therapy (postmenopausal): Secondary | ICD-10-CM

## 2021-01-25 DIAGNOSIS — K5900 Constipation, unspecified: Secondary | ICD-10-CM | POA: Diagnosis present

## 2021-01-25 DIAGNOSIS — K1379 Other lesions of oral mucosa: Secondary | ICD-10-CM | POA: Diagnosis not present

## 2021-01-25 DIAGNOSIS — I4510 Unspecified right bundle-branch block: Secondary | ICD-10-CM | POA: Diagnosis present

## 2021-01-25 DIAGNOSIS — F0151 Vascular dementia with behavioral disturbance: Secondary | ICD-10-CM | POA: Diagnosis not present

## 2021-01-25 DIAGNOSIS — G8191 Hemiplegia, unspecified affecting right dominant side: Secondary | ICD-10-CM | POA: Diagnosis present

## 2021-01-25 DIAGNOSIS — R233 Spontaneous ecchymoses: Secondary | ICD-10-CM | POA: Diagnosis present

## 2021-01-25 DIAGNOSIS — E039 Hypothyroidism, unspecified: Secondary | ICD-10-CM | POA: Diagnosis present

## 2021-01-25 DIAGNOSIS — E119 Type 2 diabetes mellitus without complications: Secondary | ICD-10-CM | POA: Diagnosis present

## 2021-01-25 DIAGNOSIS — R29700 NIHSS score 0: Secondary | ICD-10-CM | POA: Diagnosis not present

## 2021-01-25 DIAGNOSIS — N3000 Acute cystitis without hematuria: Secondary | ICD-10-CM | POA: Diagnosis not present

## 2021-01-25 DIAGNOSIS — I6389 Other cerebral infarction: Secondary | ICD-10-CM | POA: Diagnosis not present

## 2021-01-25 DIAGNOSIS — D751 Secondary polycythemia: Secondary | ICD-10-CM | POA: Diagnosis present

## 2021-01-25 DIAGNOSIS — R29702 NIHSS score 2: Secondary | ICD-10-CM | POA: Diagnosis not present

## 2021-01-25 DIAGNOSIS — Z6834 Body mass index (BMI) 34.0-34.9, adult: Secondary | ICD-10-CM | POA: Diagnosis not present

## 2021-01-25 DIAGNOSIS — R29703 NIHSS score 3: Secondary | ICD-10-CM | POA: Diagnosis present

## 2021-01-25 DIAGNOSIS — I63423 Cerebral infarction due to embolism of bilateral anterior cerebral arteries: Secondary | ICD-10-CM | POA: Diagnosis not present

## 2021-01-25 DIAGNOSIS — E785 Hyperlipidemia, unspecified: Secondary | ICD-10-CM | POA: Diagnosis present

## 2021-01-25 DIAGNOSIS — I639 Cerebral infarction, unspecified: Secondary | ICD-10-CM | POA: Diagnosis present

## 2021-01-25 DIAGNOSIS — I5042 Chronic combined systolic (congestive) and diastolic (congestive) heart failure: Secondary | ICD-10-CM | POA: Diagnosis present

## 2021-01-25 DIAGNOSIS — D72829 Elevated white blood cell count, unspecified: Secondary | ICD-10-CM | POA: Diagnosis present

## 2021-01-25 DIAGNOSIS — E876 Hypokalemia: Secondary | ICD-10-CM | POA: Diagnosis present

## 2021-01-25 DIAGNOSIS — G319 Degenerative disease of nervous system, unspecified: Secondary | ICD-10-CM | POA: Diagnosis present

## 2021-01-25 DIAGNOSIS — H44002 Unspecified purulent endophthalmitis, left eye: Secondary | ICD-10-CM | POA: Diagnosis not present

## 2021-01-25 DIAGNOSIS — I2510 Atherosclerotic heart disease of native coronary artery without angina pectoris: Secondary | ICD-10-CM | POA: Diagnosis present

## 2021-01-25 DIAGNOSIS — R29701 NIHSS score 1: Secondary | ICD-10-CM | POA: Diagnosis not present

## 2021-01-25 DIAGNOSIS — I6381 Other cerebral infarction due to occlusion or stenosis of small artery: Principal | ICD-10-CM | POA: Diagnosis present

## 2021-01-25 DIAGNOSIS — K219 Gastro-esophageal reflux disease without esophagitis: Secondary | ICD-10-CM | POA: Diagnosis present

## 2021-01-25 DIAGNOSIS — N179 Acute kidney failure, unspecified: Secondary | ICD-10-CM | POA: Diagnosis present

## 2021-01-25 DIAGNOSIS — R471 Dysarthria and anarthria: Secondary | ICD-10-CM | POA: Diagnosis present

## 2021-01-25 DIAGNOSIS — I6340 Cerebral infarction due to embolism of unspecified cerebral artery: Secondary | ICD-10-CM

## 2021-01-25 DIAGNOSIS — F32A Depression, unspecified: Secondary | ICD-10-CM | POA: Diagnosis present

## 2021-01-25 DIAGNOSIS — I110 Hypertensive heart disease with heart failure: Secondary | ICD-10-CM | POA: Diagnosis present

## 2021-01-25 LAB — RESP PANEL BY RT-PCR (FLU A&B, COVID) ARPGX2
Influenza A by PCR: NEGATIVE
Influenza B by PCR: NEGATIVE
SARS Coronavirus 2 by RT PCR: NEGATIVE

## 2021-01-25 LAB — I-STAT CHEM 8, ED
BUN: 13 mg/dL (ref 8–23)
Calcium, Ion: 1.07 mmol/L — ABNORMAL LOW (ref 1.15–1.40)
Chloride: 99 mmol/L (ref 98–111)
Creatinine, Ser: 1 mg/dL (ref 0.44–1.00)
Glucose, Bld: 149 mg/dL — ABNORMAL HIGH (ref 70–99)
HCT: 52 % — ABNORMAL HIGH (ref 36.0–46.0)
Hemoglobin: 17.7 g/dL — ABNORMAL HIGH (ref 12.0–15.0)
Potassium: 3.3 mmol/L — ABNORMAL LOW (ref 3.5–5.1)
Sodium: 135 mmol/L (ref 135–145)
TCO2: 27 mmol/L (ref 22–32)

## 2021-01-25 LAB — DIFFERENTIAL
Abs Immature Granulocytes: 0.13 10*3/uL — ABNORMAL HIGH (ref 0.00–0.07)
Basophils Absolute: 0.1 10*3/uL (ref 0.0–0.1)
Basophils Relative: 1 %
Eosinophils Absolute: 0.4 10*3/uL (ref 0.0–0.5)
Eosinophils Relative: 3 %
Immature Granulocytes: 1 %
Lymphocytes Relative: 18 %
Lymphs Abs: 2.1 10*3/uL (ref 0.7–4.0)
Monocytes Absolute: 1 10*3/uL (ref 0.1–1.0)
Monocytes Relative: 8 %
Neutro Abs: 8.2 10*3/uL — ABNORMAL HIGH (ref 1.7–7.7)
Neutrophils Relative %: 69 %

## 2021-01-25 LAB — PROTIME-INR
INR: 1.1 (ref 0.8–1.2)
Prothrombin Time: 13.9 seconds (ref 11.4–15.2)

## 2021-01-25 LAB — COMPREHENSIVE METABOLIC PANEL
ALT: 38 U/L (ref 0–44)
AST: 54 U/L — ABNORMAL HIGH (ref 15–41)
Albumin: 3.6 g/dL (ref 3.5–5.0)
Alkaline Phosphatase: 84 U/L (ref 38–126)
Anion gap: 14 (ref 5–15)
BUN: 12 mg/dL (ref 8–23)
CO2: 23 mmol/L (ref 22–32)
Calcium: 9.2 mg/dL (ref 8.9–10.3)
Chloride: 97 mmol/L — ABNORMAL LOW (ref 98–111)
Creatinine, Ser: 1.11 mg/dL — ABNORMAL HIGH (ref 0.44–1.00)
GFR, Estimated: 48 mL/min — ABNORMAL LOW (ref >60)
Glucose, Bld: 151 mg/dL — ABNORMAL HIGH (ref 70–99)
Potassium: 3.1 mmol/L — ABNORMAL LOW (ref 3.5–5.1)
Sodium: 134 mmol/L — ABNORMAL LOW (ref 135–145)
Total Bilirubin: 0.9 mg/dL (ref 0.3–1.2)
Total Protein: 7.4 g/dL (ref 6.5–8.1)

## 2021-01-25 LAB — CBC
HCT: 50.2 % — ABNORMAL HIGH (ref 36.0–46.0)
Hemoglobin: 16.6 g/dL — ABNORMAL HIGH (ref 12.0–15.0)
MCH: 31.6 pg (ref 26.0–34.0)
MCHC: 33.1 g/dL (ref 30.0–36.0)
MCV: 95.6 fL (ref 80.0–100.0)
Platelets: 654 10*3/uL — ABNORMAL HIGH (ref 150–400)
RBC: 5.25 MIL/uL — ABNORMAL HIGH (ref 3.87–5.11)
RDW: 14.7 % (ref 11.5–15.5)
WBC: 12 10*3/uL — ABNORMAL HIGH (ref 4.0–10.5)
nRBC: 0 % (ref 0.0–0.2)

## 2021-01-25 LAB — APTT: aPTT: 35 seconds (ref 24–36)

## 2021-01-25 LAB — CBG MONITORING, ED: Glucose-Capillary: 153 mg/dL — ABNORMAL HIGH (ref 70–99)

## 2021-01-25 MED ORDER — PANTOPRAZOLE SODIUM 40 MG IV SOLR
40.0000 mg | Freq: Every day | INTRAVENOUS | Status: DC
  Administered 2021-01-25 – 2021-01-26 (×2): 40 mg via INTRAVENOUS
  Filled 2021-01-25 (×2): qty 40

## 2021-01-25 MED ORDER — STROKE: EARLY STAGES OF RECOVERY BOOK: Freq: Once | Status: DC

## 2021-01-25 MED ORDER — GERHARDT'S BUTT CREAM
TOPICAL_CREAM | Freq: Two times a day (BID) | CUTANEOUS | Status: DC
  Administered 2021-01-26: 1 via TOPICAL
  Filled 2021-01-25: qty 1

## 2021-01-25 MED ORDER — ACETAMINOPHEN 325 MG PO TABS
650.0000 mg | ORAL_TABLET | ORAL | Status: DC | PRN
  Administered 2021-01-25: 650 mg via ORAL
  Filled 2021-01-25: qty 2

## 2021-01-25 MED ORDER — ACETAMINOPHEN 650 MG RE SUPP: 650.0000 mg | RECTAL | Status: DC | PRN

## 2021-01-25 MED ORDER — LEVOTHYROXINE SODIUM 112 MCG PO TABS
112.0000 ug | ORAL_TABLET | Freq: Every day | ORAL | Status: DC
  Administered 2021-01-26 – 2021-02-01 (×7): 112 ug via ORAL
  Filled 2021-01-25 (×8): qty 1

## 2021-01-25 MED ORDER — IOHEXOL 350 MG/ML SOLN
50.0000 mL | Freq: Once | INTRAVENOUS | Status: AC | PRN
  Administered 2021-01-25: 50 mL via INTRAVENOUS

## 2021-01-25 MED ORDER — SODIUM CHLORIDE 0.9% FLUSH
3.0000 mL | Freq: Once | INTRAVENOUS | Status: AC
  Administered 2021-01-25: 3 mL via INTRAVENOUS

## 2021-01-25 MED ORDER — ACETAMINOPHEN 160 MG/5ML PO SOLN: 650.0000 mg | ORAL | Status: DC | PRN

## 2021-01-25 MED ORDER — SODIUM CHLORIDE 0.9 % IV SOLN: INTRAVENOUS | Status: DC

## 2021-01-25 MED ORDER — ALTEPLASE (STROKE) FULL DOSE INFUSION
0.9000 mg/kg | Freq: Once | INTRAVENOUS | Status: AC
  Administered 2021-01-25: 74.2 mg via INTRAVENOUS
  Filled 2021-01-25: qty 100

## 2021-01-25 MED ORDER — SENNOSIDES-DOCUSATE SODIUM 8.6-50 MG PO TABS: 1.0000 | ORAL_TABLET | Freq: Every evening | ORAL | Status: DC | PRN

## 2021-01-25 MED ORDER — POTASSIUM CHLORIDE 10 MEQ/100ML IV SOLN
10.0000 meq | INTRAVENOUS | Status: AC
  Administered 2021-01-25 – 2021-01-26 (×6): 10 meq via INTRAVENOUS
  Filled 2021-01-25 (×6): qty 100

## 2021-01-25 MED ORDER — SIMVASTATIN 20 MG PO TABS
20.0000 mg | ORAL_TABLET | Freq: Every day | ORAL | Status: DC
  Administered 2021-01-26 – 2021-01-27 (×2): 20 mg via ORAL
  Filled 2021-01-25 (×2): qty 1

## 2021-01-25 MED ORDER — SODIUM CHLORIDE 0.9 % IV SOLN
50.0000 mL | Freq: Once | INTRAVENOUS | Status: AC
  Administered 2021-01-25: 50 mL via INTRAVENOUS

## 2021-01-25 MED ORDER — CHLORHEXIDINE GLUCONATE CLOTH 2 % EX PADS
6.0000 | MEDICATED_PAD | Freq: Every day | CUTANEOUS | Status: DC
  Administered 2021-01-25 – 2021-01-30 (×6): 6 via TOPICAL

## 2021-01-25 NOTE — ED Triage Notes (Signed)
Pt BIB [facility] for Code Stroke. LSN 1225. Pt took a nap, ambulated to RR with walker, R side went flaccid with R arm and leg weakness. Per EMS, sx improved but did not resolve PTA. Pt has R sided arm and leg drift with slurred speech. Pt lives at home with son.   EMS VS- 125/95, CBG 123, HR 71, SpO2 91% RA

## 2021-01-25 NOTE — Progress Notes (Signed)
PHARMACIST CODE STROKE RESPONSE  Notified to mix tPA at 1508 by Dr. Wilford Corner Delivered tPA to RN at 1510  tPA dose = 7.4mg  bolus over 1 minute followed by 66.8mg  for a total dose of 74.2mg  over 1 hour  Issues/delays encountered (if applicable): Difficulty in obtaining BP results  Daylene Posey 01/25/21 4:08 PM

## 2021-01-25 NOTE — ED Provider Notes (Signed)
[facility] EMERGENCY DEPARTMENT Provider Note   CSN: [id number] Arrival date & time: 01/25/21  1457  An emergency department physician performed an initial assessment on this suspected stroke patient at 1459.  History Chief Complaint  Patient presents with  . Code Stroke    Sue [name] is a 87 y.o. female.  HPI 87 year old female presents with acute right-sided weakness.  History is primarily from EMS.  At around 1225 she was last seen normal.  She then walked to the bathroom with her walker as typical but when coming back out she had profound right-sided weakness.  She was flaccid upon EMS arrival though she has improved just prior to arrival to the ED.  No speech changes.  Past Medical History:  Diagnosis Date  . CHF (congestive heart failure) (HCC)   . Diabetes mellitus without complication (HCC)   . Hyperlipidemia   . Thyroid disease     Patient Active Problem List   Diagnosis Date Noted  . Stroke (cerebrum) (HCC) 01/25/2021  . Acute on chronic diastolic CHF (congestive heart failure) (HCC) 01/10/2021  . Acute respiratory failure with hypoxemia (HCC) 01/10/2021  . Pneumonia 01/09/2021  . Osteoarthritis of ankle and foot 01/09/2021  . Chronic combined systolic and diastolic heart failure (HCC) 01/09/2021  . Chronic fatigue syndrome 01/09/2021  . Essential hypertension 01/09/2021  . Hyperlipidemia 01/09/2021  . Hypothyroidism 01/09/2021  . Diabetes (HCC) 04/14/2020    History reviewed. No pertinent surgical history.   OB History   No obstetric history on file.     No family history on file.  Social History   Tobacco Use  . Smoking status: Former Smoker    Types: Cigarettes    Quit date: 09/06/1958    Years since quitting: 62.4  . Smokeless tobacco: Never Used  Vaping Use  . Vaping Use: Never used  Substance Use Topics  . Alcohol use: No  . Drug use: No    Home Medications Prior to Admission medications   Medication Sig Start  Date End Date Taking? Authorizing Provider  acetaminophen (TYLENOL) 500 MG tablet Take 1,000 mg by mouth every 6 (six) hours as needed for mild pain.   Yes [provider]  buPROPion (WELLBUTRIN SR) 150 MG 12 hr tablet Take 150 mg by mouth daily.   Yes [provider]  carvedilol (COREG) 3.125 MG tablet TAKE TWO TABLETS BY MOUTH TWICE A DAY Patient taking differently: 3.125 mg 2 (two) times daily with a meal. 12/13/18  Yes Jodelle Red, MD  clonazePAM (KLONOPIN) 0.5 MG tablet Take 0.25-0.5 mg by mouth 2 (two) times daily as needed for anxiety.   Yes Marden Noble, MD  furosemide (LASIX) 40 MG tablet Take 40 mg by mouth 2 (two) times daily.   Yes [provider]  levothyroxine (SYNTHROID, LEVOTHROID) 112 MCG tablet Take 112 mcg by mouth daily before breakfast. 09/05/18  Yes [provider]  nitrofurantoin, macrocrystal-monohydrate, (MACROBID) 100 MG capsule Take 100 mg by mouth once. 01/06/21  Yes [provider]  potassium chloride (K-DUR,KLOR-CON) 10 MEQ tablet Take 10 mEq by mouth 2 (two) times daily.    Yes Marden Noble, MD  simvastatin (ZOCOR) 20 MG tablet Take 20 mg by mouth daily.   Yes Marden Noble, MD    Allergies    Patient has no known allergies.  Review of Systems   Review of Systems  Unable to perform ROS: Acuity of condition    Physical Exam Updated Vital Signs BP 108/69  Pulse 66   Temp 97.9 F (36.6 C) (Oral)   Resp (!) 24   Ht 5\' 4"  (1.626 m)   Wt 82.4 kg   SpO2 95%   BMI 31.18 kg/m   Physical Exam Vitals and nursing note reviewed.  Constitutional:      Appearance: She is well-developed.  HENT:     Head: Normocephalic and atraumatic.     Right Ear: External ear normal.     Left Ear: External ear normal.     Nose: Nose normal.  Eyes:     General:        Right eye: No discharge.        Left eye: No discharge.  Cardiovascular:     Rate and Rhythm: Normal rate and regular rhythm.     Heart sounds:  Normal heart sounds.  Pulmonary:     Effort: Pulmonary effort is normal.     Breath sounds: Normal breath sounds.  Abdominal:     Palpations: Abdomen is soft.     Tenderness: There is no abdominal tenderness.  Skin:    General: Skin is warm and dry.  Neurological:     Mental Status: She is alert.     Comments: Awake, alert, perhaps mild slurred speech. Slightly weak in RUE compared to LUE. 3/5 strength RLE, 5/5 LLE  Psychiatric:        Mood and Affect: Mood is not anxious.     ED Results / Procedures / Treatments   Labs (all labs ordered are listed, but only abnormal results are displayed) Labs Reviewed  CBC - Abnormal; Notable for the following components:      Result Value   WBC 12.0 (*)    RBC 5.25 (*)    Hemoglobin 16.6 (*)    HCT 50.2 (*)    Platelets 654 (*)    All other components within normal limits  DIFFERENTIAL - Abnormal; Notable for the following components:   Neutro Abs 8.2 (*)    Abs Immature Granulocytes 0.13 (*)    All other components within normal limits  COMPREHENSIVE METABOLIC PANEL - Abnormal; Notable for the following components:   Sodium 134 (*)    Potassium 3.1 (*)    Chloride 97 (*)    Glucose, Bld 151 (*)    Creatinine, Ser 1.11 (*)    AST 54 (*)    GFR, Estimated 48 (*)    All other components within normal limits  CBG MONITORING, ED - Abnormal; Notable for the following components:   Glucose-Capillary 153 (*)    All other components within normal limits  I-STAT CHEM 8, ED - Abnormal; Notable for the following components:   Potassium 3.3 (*)    Glucose, Bld 149 (*)    Calcium, Ion 1.07 (*)    Hemoglobin 17.7 (*)    HCT 52.0 (*)    All other components within normal limits  RESP PANEL BY RT-PCR (FLU A&B, COVID) ARPGX2  MRSA PCR SCREENING  PROTIME-INR  APTT  URINALYSIS, COMPLETE (UACMP) WITH MICROSCOPIC  HEMOGLOBIN A1C  LIPID PANEL  TSH  COMPREHENSIVE METABOLIC PANEL  MAGNESIUM  PHOSPHORUS  CBG MONITORING, ED    EKG EKG  Interpretation  Date/Time:  Sunday Jan 25 2021 15:27:25 EDT Ventricular Rate:  67 PR Interval:  187 QRS Duration: 161 QT Interval:  447 QTC Calculation: 472 R Axis:   15 Text Interpretation: Sinus rhythm Atrial premature complexes Right bundle branch block similar to May6 2022 Confirmed by 2023 [phone])  on 01/25/2021 3:47:44 PM   Radiology DG Chest Portable 1 View  Result Date: 01/25/2021 CLINICAL DATA:  Dyspnea. EXAM: PORTABLE CHEST 1 VIEW COMPARISON:  01/09/2021 and 06/02/2018 FINDINGS: The heart size and mediastinal contours are within normal limits. Pulmonary hyperinflation again no, consistent with COPD. Both lungs are clear. Rounded opacity is again seen in the right cardiophrenic angle. This is stable in appearance, and could be due to diaphragmatic/hiatal hernia, or prominent pericardial fat pad in this location. IMPRESSION: COPD. No active lung disease. Stable rounded opacity in right cardiophrenic angle, which could be due to diaphragmatic/hiatal hernia, or prominent epicardial fat pad. Electronically Signed   By: Danae OrleansJohn A Stahl M.D.   On: 01/25/2021 16:30   CT HEAD CODE STROKE WO CONTRAST  Result Date: 01/25/2021 CLINICAL DATA:  Code stroke. Acute neuro deficit. Right-sided weakness EXAM: CT HEAD WITHOUT CONTRAST TECHNIQUE: Contiguous axial images were obtained from the base of the skull through the vertex without intravenous contrast. COMPARISON:  CT head 01/09/2021 FINDINGS: Brain: Generalized atrophy. Negative for hydrocephalus. Chronic microvascular ischemic changes in the white matter bilaterally unchanged. Chronic infarct in the right internal capsule unchanged. Hypodensity in the right occipital lobe has become lower density and better define compared to the prior study and is compatible with chronic infarct. Negative for acute cortical infarct, hemorrhage, mass Vascular: Negative for hyperdense vessel Skull: Scattered small lytic lesions in the calvarium bilaterally  unchanged from the prior study. These all measure approximately 5 mm in diameter. Sinuses/Orbits: Mild mucosal edema paranasal sinuses. Other: None ASPECTS (Alberta Stroke Program Early CT Score) - Ganglionic level infarction (caudate, lentiform nuclei, internal capsule, insula, M1-M3 cortex): 7 - Supraganglionic infarction (M4-M6 cortex): 3 Total score (0-10 with 10 being normal): 10 IMPRESSION: 1. No acute infarct identified 2. ASPECTS is 10 3. Atrophy and chronic microvascular ischemia. Chronic infarct right occipital lobe. 4. Code stroke imaging results were communicated on 01/25/2021 at 3:20 pm to provider Wilford CornerArora via text page 5. Multiple small lytic lesions in the calvarium. While these could be benign, consider myeloma. Electronically Signed   By: Marlan Palauharles  Clark M.D.   On: 01/25/2021 15:22   CT ANGIO HEAD NECK W WO CM (CODE STROKE)  Result Date: 01/25/2021 CLINICAL DATA:  Acute neuro deficit.  Right-sided weakness. EXAM: CT ANGIOGRAPHY HEAD AND NECK TECHNIQUE: Multidetector CT imaging of the head and neck was performed using the standard protocol during bolus administration of intravenous contrast. Multiplanar CT image reconstructions and MIPs were obtained to evaluate the vascular anatomy. Carotid stenosis measurements (when applicable) are obtained utilizing NASCET criteria, using the distal internal carotid diameter as the denominator. CONTRAST:  50mL OMNIPAQUE IOHEXOL 350 MG/ML SOLN COMPARISON:  CT head 01/25/2021 FINDINGS: CTA NECK FINDINGS Aortic arch: Mild atherosclerotic disease in the aortic arch. Proximal great vessels widely patent. Right carotid system: Right carotid widely patent without significant stenosis. Left carotid system: Left carotid widely patent without significant stenosis. Vertebral arteries: Both vertebral arteries are widely patent to the basilar without stenosis. Skeleton: Cervical spondylosis.  No acute abnormality. Other neck: Negative for mass or adenopathy in the neck. Upper  chest: Lung apices clear bilaterally. Review of the MIP images confirms the above findings CTA HEAD FINDINGS Anterior circulation: Cavernous carotid widely patent bilaterally. Anterior and middle cerebral arteries patent bilaterally without stenosis or large vessel occlusion. Posterior circulation: Both vertebral arteries patent to the basilar. PICA patent bilaterally. Basilar patent with mild atherosclerotic irregularity. Anterior middle cerebral arteries patent bilaterally. Mild stenosis in the right P2 segment. No  large vessel occlusion. Venous sinuses: Normal venous enhancement. Anatomic variants: None Review of the MIP images confirms the above findings IMPRESSION: 1. Negative for intracranial large vessel occlusion 2. No significant carotid or vertebral artery stenosis in the neck. Electronically Signed   By: Marlan Palau M.D.   On: 01/25/2021 15:37    Procedures .Critical Care Performed by: Pricilla Loveless, MD Authorized by: Pricilla Loveless, MD   Critical care provider statement:    Critical care time (minutes):  30   Critical care time was exclusive of:  Separately billable procedures and treating other patients   Critical care was necessary to treat or prevent imminent or life-threatening deterioration of the following conditions:  CNS failure or compromise   Critical care was time spent personally by me on the following activities:  Discussions with consultants, evaluation of patient's response to treatment, examination of patient, ordering and performing treatments and interventions, ordering and review of laboratory studies, ordering and review of radiographic studies, pulse oximetry, re-evaluation of patient's condition, obtaining history from patient or surrogate and review of old charts     Medications Ordered in ED Medications   stroke: mapping our early stages of recovery book ( Does not apply Not Given 01/25/21 1922)  0.9 %  sodium chloride infusion ( Intravenous New Bag/Given  01/25/21 2030)  acetaminophen (TYLENOL) tablet 650 mg (650 mg Oral Given 01/25/21 2230)    Or  acetaminophen (TYLENOL) 160 MG/5ML solution 650 mg ( Per Tube See Alternative 01/25/21 2230)    Or  acetaminophen (TYLENOL) suppository 650 mg ( Rectal See Alternative 01/25/21 2230)  senna-docusate (Senokot-S) tablet 1 tablet (has no administration in time range)  pantoprazole (PROTONIX) injection 40 mg (40 mg Intravenous Given 01/25/21 2224)  simvastatin (ZOCOR) tablet 20 mg (has no administration in time range)  levothyroxine (SYNTHROID) tablet 112 mcg (has no administration in time range)  potassium chloride 10 mEq in 100 mL IVPB (10 mEq Intravenous New Bag/Given 01/25/21 2217)  Gerhardt's butt cream ( Topical Given 01/25/21 2216)  Chlorhexidine Gluconate Cloth 2 % PADS 6 each (6 each Topical Given 01/25/21 2215)  sodium chloride flush (NS) 0.9 % injection 3 mL (3 mLs Intravenous Given 01/25/21 1624)  iohexol (OMNIPAQUE) 350 MG/ML injection 50 mL (50 mLs Intravenous Contrast Given 01/25/21 1526)  alteplase (ACTIVASE) 1 mg/mL infusion 74.2 mg (0 mg/kg  82.4 kg Intravenous Stopped 01/25/21 1623)    Followed by  0.9 %  sodium chloride infusion (50 mLs Intravenous New Bag/Given 01/25/21 1623)    ED Course  I have reviewed the triage vital signs and the nursing notes.  Pertinent labs & imaging results that were available during my care of the patient were reviewed by me and considered in my medical decision making (see chart for details).    MDM Rules/Calculators/A&P                          Patient presents within the code stroke window and was given tPA by neurology.  She is also noted to be mildly hypoxic,with no clear cause.  She will need admission to the neuro ICU.  Blood pressures are normal. Final Clinical Impression(s) / ED Diagnoses Final diagnoses:  Acute ischemic stroke [facility])    Rx / DC Orders ED Discharge Orders    None       Pricilla Loveless, MD 01/25/21 2324

## 2021-01-25 NOTE — ED Notes (Signed)
During pts neuro check, pt noted by this RN to have changes in visual fields. Wilford Corner, MD made aware. No new orders at this time.

## 2021-01-25 NOTE — H&P (Addendum)
Neurology H&P  CC: Code Stroke - right side flaccid, slurred speech   History is obtained from: EMS and medical record  HPI: Sue [name] is a 87 y.o. female with past medical history HTN, HLD, hypothyroidism, CHF who presented to [facility] ED via EMS for acute right side weakness and slurred speech. She had gone to the bathroom with her walker and while leaving the bathroom was noted to have acute right side weakness.  EMS arrived and right side was flaccid, exam improved somewhat en route to hospital.  On arrival to ED with right side hemiparesis and slurred speech. CT H with no acute abnormality. CTA H/N  No LVO. Patient deemed appropriate for IV TPA.  Risks and benefits discussed with the son over the phone via Dr. Wilford Corner.  LKW: 1225 tpa given?: yes,  1314  Modified Rankin Scale: 4-Needs assistance to walk and tend to bodily needs  ROS:  ROS was performed and is negative except as noted in the HPI.  Past Medical History:  Diagnosis Date  . CHF (congestive heart failure) (HCC)   . Diabetes mellitus without complication (HCC)   . Hyperlipidemia   . Thyroid disease     No family history on file.  Social History:  reports that she quit smoking about 62 years ago. Her smoking use included cigarettes. She has never used smokeless tobacco. She reports that she does not drink alcohol and does not use drugs.   Exam: Current vital signs: Wt 82.4 kg   BMI 31.18 kg/m  Vital signs in last 24 hours: Weight:  [82.4 kg] 82.4 kg (05/22 1500)  Physical Exam  Constitutional: Appears well-developed and well-nourished.  Psych: Affect appropriate to situation Eyes: No scleral injection HENT: No OP obstrucion Head: Normocephalic.  Cardiovascular: Normal rate and regular rhythm.  Respiratory: Labored breathing and breath sounds normal to anterior ascultation GI: Soft.  No distension. There is no tenderness.  Skin: WDI  Neuro: Mental Status: Patient is awake, alert, oriented to person, place,  month, year, and situation. Patient is able to give a clear and coherent history. No signs of aphasia or neglect Cranial Nerves: II: Visual Fields are full. Pupils are equal, round, and reactive to light.   III,IV, VI: EOMI without ptosis or diploplia.  V: Facial sensation is symmetric to temperature VII: Facial movement is symmetric.  VIII: hearing is intact to voice X: Uvula elevates symmetrically XI: Shoulder shrug is symmetric. XII: tongue is midline without atrophy or fasciculations.  Motor: Tone is normal. Bulk is normal. 5/5 strength was present on left upper and left lower. Right arm and leg 4/5 Sensory: Sensation is symmetric to light touch and temperature in the arms and legs. Cerebellar: FNF and HKS are intact bilaterally   1a Level of Conscious.: 0 1b LOC Questions: 0 1c LOC Commands: 0 2 Best Gaze: 0 3 Visual: 0 4 Facial Palsy: 0 5a Motor Arm - left: 0 5b Motor Arm - Right: 1 6a Motor Leg - Left: 0 6b Motor Leg - Right: 1 7 Limb Ataxia: 0 8 Sensory: 0 9 Best Language: 0 10 Dysarthria: 1 11 Extinct. and Inatten.: 0 TOTAL: 3  I have reviewed labs in epic and the results pertinent to this consultation are: CBC Latest Ref Rng & Units 01/25/2021 01/25/2021 01/12/2021  WBC 4.0 - 10.5 K/uL - 12.0(H) 9.7  Hemoglobin 12.0 - 15.0 g/dL 17.7(H) 16.6(H) 15.1(H)  Hematocrit 36.0 - 46.0 % 52.0(H) 50.2(H) 45.9  Platelets 150 - 400 K/uL - 654(H)  436(H)   BMP Latest Ref Rng & Units 01/25/2021 01/25/2021 01/13/2021  Glucose 70 - 99 mg/dL 403(K) 742(V) 956(L)  BUN 8 - 23 mg/dL 13 12 19   Creatinine 0.44 - 1.00 mg/dL 8.75) 6.43(P  Sodium 135 - 145 mmol/L 135 134(L) 137  Potassium 3.5 - 5.1 mmol/L 3.3(L) 3.1(L) 3.7  Chloride 98 - 111 mmol/L 99 97(L) 101  CO2 22 - 32 mmol/L - 23 25  Calcium 8.9 - 10.3 mg/dL - 9.2 9.0    I have reviewed the images obtained:  CT Head No acute abnormality   CTA head/neck  No LVO    A&P 87 year old presenting with sudden onset of  right-sided weakness.  No speech deficits.  No visual Or eye motion abnormalities making LVO less likely. Not a candidate for thrombectomy due to poor baseline modified Rankin Within time window for IV tPA-given IV tPA.  Primary Diagnosis: Cerebral infarction due to thrombosis versus embolism Neuro    - 2hr HR s/p Brain imaging tomorrow @1500   - No anti-platelets  for 24 hr post TPA  - if no hemorrhage on 24 hr imaging start ASA  - no non- compressible sticks or tube insertions for 24hr   - Bedside swallow exam, if fails will need speech therapy and NG tube placed   - BP goal <180/105   - EKG  - 2D echo   - Frequent neuro checks and vital signs   - telemetry   - UA   - PT/OT/ST   CV Secondary Diagnosis: Essential (primary) hypertension and Heart failure, unspecified  - home coreg on hold for now   - Home lasix on hold for now   HLD   - continue home zocor 20mg    - Check Lipid panel, goal < 70    Resp   - SPO2> 94%   - check CXR   GI  Dysphagia   - NPO until bedside swallow screen   - may start diet if passes bedside swallow screen   - Protonix   - colace PRN   Endocrine   - continue home levothyroxine 76   - check TSH  - Check HGBA1c, goal < 7.0  - Maintain euglycemia <180,   - SSI if needed  Renal  Hypokalemia   - K 3.3   - replete    - BUN 13, Cr 1.0  - BMP, Mg Phos in am   Infectious  Leukocytosis, possible aspiration pneumonia  - WBC 12  - Check UA and CXR   - afebrile   - no abx at this time   - CBC in am  Heme  Polycythemia and Thrombocytosis   - HGB 17.7  - PLT 654  - CBC in am   Stoke team will follow   , DNP, ACNPC-AG     Attending addendum Patient seen and examined is an acute code stroke with sudden onset of right hemiparesis.  No signs of LVO on exam.  Noncontrast head CT reviewed personally by me-no bleed, aspects 10.  CTA reviewed- negative for intracranial large vessel occlusion.  No significant carotid or  vertebral artery stenosis in the neck. She is modified Rankin score of 4-not a candidate for thrombectomy. Risk benefits for IV tPA discussed with the son over the phone. IV tPA administered. Admitted to the neuro ICU for stroke work-up and post tPA care. Likely small vessel versus embolic stroke involving the left hemisphere. Spoke with the son Wyllow Seigler and updated him with  the plan.  Had spoken with them earlier to discuss risk benefits of IV tPA which she had agreed to. Stroke team will continue to follow  -- Milon Dikes, MD Neurologist [facility] Pager: [phone]   THE FOLLOWING WERE PRESENT ON ADMISSION: Acute ischemic stroke, hemiparesis, possible aspiration pneumonia, heart failure  CRITICAL CARE ATTESTATION Performed by: Milon Dikes, MD Total critical care time: 40 minutes Critical care time was exclusive of separately billable procedures and treating other patients and/or supervising APPs/Residents/Students Critical care was necessary to treat or prevent imminent or life-threatening deterioration due to acute ischemic stroke, IV thrombolysis This patient is critically ill and at significant risk for neurological worsening and/or death and care requires constant monitoring. Critical care was time spent personally by me on the following activities: development of treatment plan with patient and/or surrogate as well as nursing, discussions with consultants, evaluation of patient's response to treatment, examination of patient, obtaining history from patient or surrogate, ordering and performing treatments and interventions, ordering and review of laboratory studies, ordering and review of radiographic studies, pulse oximetry, re-evaluation of patient's condition, participation in multidisciplinary rounds and medical decision making of high complexity in the care of this patient.

## 2021-01-25 NOTE — ED Notes (Signed)
Attempt to call report to [facility] progressive multiple times with no response.

## 2021-01-25 NOTE — ED Notes (Signed)
Updated patient's son, Cleone Slim, at this time.

## 2021-01-25 NOTE — ED Notes (Signed)
During pts neuro check, pt stated that she was 8, then stated she was 89. Wilford Corner, MD made aware.

## 2021-01-26 ENCOUNTER — Inpatient Hospital Stay (HOSPITAL_COMMUNITY): Payer: HMO

## 2021-01-26 DIAGNOSIS — I6389 Other cerebral infarction: Secondary | ICD-10-CM | POA: Diagnosis not present

## 2021-01-26 LAB — URINALYSIS, COMPLETE (UACMP) WITH MICROSCOPIC
Bilirubin Urine: NEGATIVE
Glucose, UA: NEGATIVE mg/dL
Hgb urine dipstick: NEGATIVE
Ketones, ur: NEGATIVE mg/dL
Leukocytes,Ua: NEGATIVE
Nitrite: NEGATIVE
Protein, ur: NEGATIVE mg/dL
Specific Gravity, Urine: 1.018 (ref 1.005–1.030)
pH: 6 (ref 5.0–8.0)

## 2021-01-26 LAB — GLUCOSE, CAPILLARY
Glucose-Capillary: 118 mg/dL — ABNORMAL HIGH (ref 70–99)
Glucose-Capillary: 133 mg/dL — ABNORMAL HIGH (ref 70–99)
Glucose-Capillary: 112 mg/dL — ABNORMAL HIGH (ref 70–99)
Glucose-Capillary: 117 mg/dL — ABNORMAL HIGH (ref 70–99)

## 2021-01-26 LAB — COMPREHENSIVE METABOLIC PANEL
ALT: 31 U/L (ref 0–44)
AST: 37 U/L (ref 15–41)
Albumin: 3.1 g/dL — ABNORMAL LOW (ref 3.5–5.0)
Alkaline Phosphatase: 72 U/L (ref 38–126)
Anion gap: 9 (ref 5–15)
BUN: 11 mg/dL (ref 8–23)
CO2: 27 mmol/L (ref 22–32)
Calcium: 8.7 mg/dL — ABNORMAL LOW (ref 8.9–10.3)
Chloride: 98 mmol/L (ref 98–111)
Creatinine, Ser: 1.06 mg/dL — ABNORMAL HIGH (ref 0.44–1.00)
GFR, Estimated: 50 mL/min — ABNORMAL LOW (ref >60)
Glucose, Bld: 117 mg/dL — ABNORMAL HIGH (ref 70–99)
Potassium: 3.5 mmol/L (ref 3.5–5.1)
Sodium: 134 mmol/L — ABNORMAL LOW (ref 135–145)
Total Bilirubin: 0.7 mg/dL (ref 0.3–1.2)
Total Protein: 6.3 g/dL — ABNORMAL LOW (ref 6.5–8.1)

## 2021-01-26 LAB — LIPID PANEL
Cholesterol: 182 mg/dL (ref 0–200)
HDL: 25 mg/dL — ABNORMAL LOW (ref >40)
LDL Cholesterol: 123 mg/dL — ABNORMAL HIGH (ref 0–99)
Total CHOL/HDL Ratio: 7.3 RATIO
Triglycerides: 168 mg/dL — ABNORMAL HIGH (ref <150)
VLDL: 34 mg/dL (ref 0–40)

## 2021-01-26 LAB — ECHOCARDIOGRAM LIMITED
Area-P 1/2: 2.76 cm2
Height: 64 in
S' Lateral: 3.1 cm
Weight: 2906.54 oz

## 2021-01-26 LAB — PHOSPHORUS: Phosphorus: 4 mg/dL (ref 2.5–4.6)

## 2021-01-26 LAB — HEMOGLOBIN A1C
Hgb A1c MFr Bld: 6.9 % — ABNORMAL HIGH (ref 4.8–5.6)
Mean Plasma Glucose: 151.33 mg/dL

## 2021-01-26 LAB — MAGNESIUM: Magnesium: 1.9 mg/dL (ref 1.7–2.4)

## 2021-01-26 LAB — MRSA PCR SCREENING: MRSA by PCR: NEGATIVE

## 2021-01-26 LAB — TSH: TSH: 3.783 u[IU]/mL (ref 0.350–4.500)

## 2021-01-26 MED ORDER — ASPIRIN 81 MG PO CHEW
81.0000 mg | CHEWABLE_TABLET | Freq: Every day | ORAL | Status: DC
  Administered 2021-01-26 – 2021-02-01 (×7): 81 mg via ORAL
  Filled 2021-01-26 (×7): qty 1

## 2021-01-26 MED ORDER — ORAL CARE MOUTH RINSE
15.0000 mL | Freq: Two times a day (BID) | OROMUCOSAL | Status: DC
  Administered 2021-01-26 – 2021-02-01 (×11): 15 mL via OROMUCOSAL

## 2021-01-26 MED ORDER — INSULIN ASPART 100 UNIT/ML IJ SOLN
0.0000 [IU] | Freq: Three times a day (TID) | INTRAMUSCULAR | Status: DC
  Administered 2021-01-26 – 2021-01-27 (×2): 1 [IU] via SUBCUTANEOUS
  Administered 2021-01-28: 2 [IU] via SUBCUTANEOUS
  Administered 2021-01-28 – 2021-01-30 (×5): 1 [IU] via SUBCUTANEOUS
  Administered 2021-01-31: 2 [IU] via SUBCUTANEOUS

## 2021-01-26 NOTE — Evaluation (Signed)
Physical Therapy Evaluation Patient Details Name: Sue [name] MRN: [id number] DOB: 08-08-1930 Today's Date: 01/26/2021   History of Present Illness  87 yo female presenting to ED on 5/22 with R sided weakness and slurred speech. TPA administered 5/22 at 1314. Imaging revealed no acute abnormality. PMH includes: CHF, DM II, HLD< and thyroid disease.    Clinical Impression  Pt in bed upon arrival of PT, agreeable to evaluation at this time. Prior to admission the pt reports she was independent with use of RW, mobilizing in her home and completing all ADLs independently. She reports she lives with her son who can assist 24/7 as needed. The pt now presents with limitations in functional mobility, power, stability, and activity tolerance due to above dx, and will continue to benefit from skilled PT to address these deficits. The pt was eager to mobilize, but did require minA of 1-2 to safely complete initial sit-stand transfers from EOB to manage balance, and to maintain stability without UE support. When given a RW for mobility in the room, the pt was able to ambulate 8 ft with minA of 1 only. The pt demos poor stability without UE support, and minimal clearance and stride length bilaterally, requiring increased effort and increasing risk of falls. The pt will continue to benefit from skilled PT acutely, as well as OP neuro PT and full supervision from son for mobility once medically ready for d/c.      Follow Up Recommendations Outpatient PT;Supervision for mobility/OOB    Equipment Recommendations  3in1 (PT)    Recommendations for Other Services       Precautions / Restrictions Precautions Precautions: Fall Restrictions Weight Bearing Restrictions: No      Mobility  Bed Mobility Overal bed mobility: Needs Assistance Bed Mobility: Supine to Sit     Supine to sit: Mod assist     General bed mobility comments: Mod A to elevate trunk from lowered HOB position     Transfers Overall transfer level: Needs assistance Equipment used: Rolling walker (2 wheeled) Transfers: Sit to/from [employer] Sit to Stand: Min assist;+2 physical assistance Stand pivot transfers: Min assist;+2 safety/equipment       General transfer comment: Min A for power up and then maintaining balance to pivot to recliner.  Ambulation/Gait Ambulation/Gait assistance: Min assist Gait Distance (Feet): 8 Feet Assistive device: Rolling walker (2 wheeled) Gait Pattern/deviations: Step-through pattern;Decreased stride length;Trunk flexed Gait velocity: decreased Gait velocity interpretation: <1.31 ft/sec, indicative of household ambulator General Gait Details: short bout of ambulationin the room with minA to steady, BUE support on RW, cues for positioning in RW, posture.      Modified Rankin (Stroke Patients Only) Modified Rankin (Stroke Patients Only) Pre-Morbid Rankin Score: Moderately severe disability Modified Rankin: Moderately severe disability     Balance Overall balance assessment: Needs assistance;History of Falls Sitting-balance support: Feet supported Sitting balance-Leahy Scale: Fair Sitting balance - Comments: Able to maintain static sitting. Difficulty maintaining balance to don socks   Standing balance support: Bilateral upper extremity supported;During functional activity Standing balance-Leahy Scale: Poor Standing balance comment: reliant on UE support                             Pertinent Vitals/Pain Pain Assessment: Faces Faces Pain Scale: No hurt Pain Intervention(s): Monitored during session;Limited activity within patient's tolerance;Repositioned    Home Living Family/patient expects to be discharged to:: Private residence Living Arrangements: Children Available Help at Discharge: Family;Available  24 hours/day Type of Home: House Home Access: Level entry;Stairs to enter   Entrance Stairs-Number of Steps:  1 Home Layout: One level Home Equipment: Walker - 2 wheels;Shower seat;Grab bars - tub/shower;Hand held shower head      Prior Function Level of Independence: Independent with assistive device(s)         Comments: walked with RW     Hand Dominance   Dominant Hand: Right    Extremity/Trunk Assessment   Upper Extremity Assessment Upper Extremity Assessment: Generalized weakness    Lower Extremity Assessment Lower Extremity Assessment: Generalized weakness    Cervical / Trunk Assessment Cervical / Trunk Assessment: Normal  Communication   Communication: HOH  Cognition Arousal/Alertness: Awake/alert Behavior During Therapy: WFL for tasks assessed/performed Overall Cognitive Status: Impaired/Different from baseline Area of Impairment: Following commands;Problem solving;Awareness                       Following Commands: Follows one step commands with increased time;Follows multi-step commands inconsistently   Awareness: Emergent Problem Solving: Slow processing;Requires verbal cues General Comments: Requiring increased time for following cues. Also, HOH and requiring repeated cues. Very agreeable to therapy      General Comments General comments (skin integrity, edema, etc.): VSS on RA    Exercises     Assessment/Plan    PT Assessment Patient needs continued PT services  PT Problem List Decreased strength;Decreased activity tolerance;Decreased balance;Decreased mobility;Decreased coordination;Decreased knowledge of use of DME;Decreased safety awareness       PT Treatment Interventions Therapeutic activities;Therapeutic exercise;Functional mobility training;Patient/family education;Gait training;DME instruction;Stair training;Balance training;Neuromuscular re-education    PT Goals (Current goals can be found in the Care Plan section)  Acute Rehab PT Goals Patient Stated Goal: none stated PT Goal Formulation: With patient Time For Goal Achievement:  02/09/21 Potential to Achieve Goals: Fair    Frequency Min 4X/week   Barriers to discharge        Co-evaluation PT/OT/SLP Co-Evaluation/Treatment: Yes Reason for Co-Treatment: For patient/therapist safety;To address functional/ADL transfers PT goals addressed during session: Mobility/safety with mobility;Balance;Proper use of DME;Strengthening/ROM OT goals addressed during session: ADL's and self-care       AM-PAC PT "6 Clicks" Mobility  Outcome Measure Help needed turning from your back to your side while in a flat bed without using bedrails?: A Little Help needed moving from lying on your back to sitting on the side of a flat bed without using bedrails?: A Lot Help needed moving to and from a bed to a chair (including a wheelchair)?: A Little Help needed standing up from a chair using your arms (e.g., wheelchair or bedside chair)?: A Little Help needed to walk in hospital room?: A Little Help needed climbing 3-5 steps with a railing? : A Lot 6 Click Score: 16    End of Session Equipment Utilized During Treatment: Gait belt Activity Tolerance: Patient tolerated treatment well Patient left: in chair;with call bell/phone within reach;with chair alarm set Nurse Communication: Mobility status PT Visit Diagnosis: Other abnormalities of gait and mobility (R26.89)    Time: 1660-6301 PT Time Calculation (min) (ACUTE ONLY): 18 min   Charges:   PT Evaluation $PT Eval Low Complexity: 1 Low          Rolm Baptise, PT, DPT   Acute Rehabilitation Department Pager #: [phone]  Gaetana Michaelis 01/26/2021, 12:52 PM

## 2021-01-26 NOTE — Progress Notes (Signed)
  Echocardiogram 2D Echocardiogram has been performed.  Pieter Partridge 01/26/2021, 10:12 AM

## 2021-01-26 NOTE — Evaluation (Signed)
Occupational Therapy Evaluation Patient Details Name: Sue [name] MRN: [id number] DOB: 1930-02-20 Today's Date: 01/26/2021    History of Present Illness 87 yo female presenting to ED on 5/22 with R sided weakness and slurred speech. TPA administered 5/22 at 1314. Imaging revealed no acute abnormality. PMH includes: CHF, DM II, HLD< and thyroid disease.   Clinical Impression   PTA, pt was living with her son and performed ADLs and light IADLs; uses a RW for mobility. Pt currently requiring Min A for UB ADLs, Mod-Max A for LB ADLs, and Min A +2 for functional mobility with RW. Pt presenting with decreased strength, balance, and activity tolerance. Pt would benefit from further acute OT to facilitate safe dc. Pending confirmation of home support, recommend dc to home with follow up at OP OT for further OT to optimize safety, independence with ADLs, and return to PLOF.     Follow Up Recommendations  Outpatient OT;Supervision/Assistance - 24 hour    Equipment Recommendations  3 in 1 bedside commode    Recommendations for Other Services PT consult     Precautions / Restrictions Precautions Precautions: Fall Restrictions Weight Bearing Restrictions: No      Mobility Bed Mobility Overal bed mobility: Needs Assistance Bed Mobility: Supine to Sit     Supine to sit: Mod assist     General bed mobility comments: Mod A to elevate trunk from lowered HOB position    Transfers Overall transfer level: Needs assistance Equipment used: Rolling walker (2 wheeled) Transfers: Sit to/from [employer] Sit to Stand: Min assist;+2 physical assistance Stand pivot transfers: Min assist;+2 safety/equipment       General transfer comment: Min A for power up and then maintaining balance to pivot to recliner.    Balance Overall balance assessment: Needs assistance;History of Falls Sitting-balance support: Feet supported Sitting balance-Leahy Scale: Fair Sitting balance -  Comments: Able to maintain static sitting. Difficulty maintaining balance to don socks   Standing balance support: Bilateral upper extremity supported;During functional activity Standing balance-Leahy Scale: Poor Standing balance comment: reliant on UE support                           ADL either performed or assessed with clinical judgement   ADL Overall ADL's : Needs assistance/impaired Eating/Feeding: Set up;Sitting Eating/Feeding Details (indicate cue type and reason): Pt eating breakfast once set up in recliner. No need for assistance to open containers and able to organize plate. Grooming: Set up;Supervision/safety;Sitting   Upper Body Bathing: Set up;Supervision/ safety;Sitting   Lower Body Bathing: Moderate assistance;Sit to/from stand   Upper Body Dressing : Minimal assistance;Sitting Upper Body Dressing Details (indicate cue type and reason): donned second gown Lower Body Dressing: Maximal assistance;Sit to/from stand Lower Body Dressing Details (indicate cue type and reason): Unable to bend foward to safely don socks. Max A to don socks. Toilet Transfer: Minimal assistance;Stand-pivot (simulated to recliner) Statistician Details (indicate cue type and reason): Min A for power up and gaining balance         Functional mobility during ADLs: Minimal assistance;Rolling walker General ADL Comments: Pt performing stand pivot to recliner and then forward mobility forward with RW. Pt presenting with decreased activity tolerance, balance, and strength     Vision Baseline Vision/History: Wears glasses Wears Glasses: At all times Patient Visual Report: No change from baseline       Perception     Praxis      Pertinent Vitals/Pain  Pain Assessment: Faces Faces Pain Scale: No hurt Pain Intervention(s): Monitored during session;Limited activity within patient's tolerance;Repositioned     Hand Dominance Right   Extremity/Trunk Assessment Upper Extremity  Assessment Upper Extremity Assessment: Generalized weakness   Lower Extremity Assessment Lower Extremity Assessment: Generalized weakness   Cervical / Trunk Assessment Cervical / Trunk Assessment: Normal   Communication Communication Communication: HOH   Cognition Arousal/Alertness: Awake/alert Behavior During Therapy: WFL for tasks assessed/performed Overall Cognitive Status: Impaired/Different from baseline Area of Impairment: Following commands;Problem solving;Awareness                       Following Commands: Follows one step commands with increased time;Follows multi-step commands inconsistently   Awareness: Emergent Problem Solving: Slow processing;Requires verbal cues General Comments: Requiring increased time for following cues. Also, HOH and requiring repeated cues. Very agreeable to therapy   General Comments  VSS on RA    Exercises     Shoulder Instructions      Home Living Family/patient expects to be discharged to:: Private residence Living Arrangements: Children Available Help at Discharge: Family;Available 24 hours/day Type of Home: House Home Access: Level entry;Stairs to enter Entrance Stairs-Number of Steps: 1   Home Layout: One level     Bathroom Shower/Tub: Chief Strategy Officer: Standard     Home Equipment: Environmental consultant - 2 wheels;Shower seat;Grab bars - tub/shower;Hand held shower head          Prior Functioning/Environment Level of Independence: Independent with assistive device(s)        Comments: walked with RW        OT Problem List: Decreased strength;Decreased range of motion;Decreased activity tolerance;Impaired balance (sitting and/or standing);Decreased cognition;Decreased safety awareness;Decreased knowledge of use of DME or AE      OT Treatment/Interventions: Self-care/ADL training;Therapeutic exercise;Energy conservation;DME and/or AE instruction;Therapeutic activities;Patient/family education    OT  Goals(Current goals can be found in the care plan section) Acute Rehab OT Goals Patient Stated Goal: "I am waiting for my breakfast" OT Goal Formulation: With patient Time For Goal Achievement: 02/09/21 Potential to Achieve Goals: Good  OT Frequency: Min 3X/week   Barriers to D/C:            Co-evaluation PT/OT/SLP Co-Evaluation/Treatment: Yes Reason for Co-Treatment: For patient/therapist safety;To address functional/ADL transfers PT goals addressed during session: Mobility/safety with mobility;Balance;Proper use of DME;Strengthening/ROM OT goals addressed during session: ADL's and self-care      AM-PAC OT "6 Clicks" Daily Activity     Outcome Measure Help from another person eating meals?: A Little Help from another person taking care of personal grooming?: A Little Help from another person toileting, which includes using toliet, bedpan, or urinal?: A Little Help from another person bathing (including washing, rinsing, drying)?: A Lot Help from another person to put on and taking off regular upper body clothing?: A Little Help from another person to put on and taking off regular lower body clothing?: A Lot 6 Click Score: 16   End of Session Equipment Utilized During Treatment: Rolling walker;Gait belt Nurse Communication: Mobility status  Activity Tolerance: Patient tolerated treatment well Patient left: in chair;with call bell/phone within reach;with chair alarm set  OT Visit Diagnosis: Unsteadiness on feet (R26.81);Other abnormalities of gait and mobility (R26.89);Muscle weakness (generalized) (M62.81);History of falling (Z91.81)                Time: 9702-6378 OT Time Calculation (min): 18 min Charges:  OT General Charges $OT Visit: 1 Visit OT  Evaluation $OT Eval Moderate Complexity: 1 Mod  Kindred Heying MSOT, OTR/L Acute Rehab Pager: [phone] Office: [phone]  Theodoro Grist Ines Rebel 01/26/2021, 12:53 PM

## 2021-01-26 NOTE — Progress Notes (Signed)
STROKE TEAM PROGRESS NOTE   INTERVAL HISTORY Afebrile. BP stable. Mild bradycarda overnight in 50s (on coreg)  CT code stroke negative, no LVO on CTA.  She recalls being weak on the right upper and lower extremity yesterday and coming in for possible stroke.  Lives with son. Uses walker at baseline.   Appears to be improved on exam with right sided weakness resolving. Passed bedside swallow for diet.  No visitors at bedside. We discussed her ongoing work up and plan of care. Her questions were addressed. Blood pressure adequately controlled. Vitals:   01/26/21 0600 01/26/21 0700 01/26/21 0800 01/26/21 0900  BP: (!) 142/78 (!) 142/65 138/82 134/77  Pulse: (!) 59 61 66 67  Resp: (!) 21 20 (!) 21 (!) 21  Temp:   98 F (36.7 C)   TempSrc:   Oral   SpO2: 94% 95% 95% 98%  Weight:      Height:       CBC:  Recent Labs  Lab 01/25/21 1500 01/25/21 1505  WBC 12.0*  --   NEUTROABS 8.2*  --   HGB 16.6* 17.7*  HCT 50.2* 52.0*  MCV 95.6  --   PLT 654*  --    Basic Metabolic Panel:  Recent Labs  Lab 01/25/21 1500 01/25/21 1505 01/26/21 0439  NA 134* 135 134*  K 3.1* 3.3* 3.5  CL 97* 99 98  CO2 23  --  27  GLUCOSE 151* 149* 117*  BUN 12 13 11   CREATININE 1.11* 1.00 1.06*  CALCIUM 9.2  --  8.7*  MG  --   --  1.9  PHOS  --   --  4.0   Lipid Panel:  Recent Labs  Lab 01/26/21 0439  CHOL 182  TRIG 168*  HDL 25*  CHOLHDL 7.3  VLDL 34  LDLCALC 01/28/21*   HgbA1c:  Recent Labs  Lab 01/26/21 0439  HGBA1C 6.9*   Urine Drug Screen: No results for input(s): LABOPIA, COCAINSCRNUR, LABBENZ, AMPHETMU, THCU, LABBARB in the last 168 hours.  Alcohol Level No results for input(s): ETH in the last 168 hours.  IMAGING past 24 hours DG Chest Portable 1 View  Result Date: 01/25/2021 CLINICAL DATA:  Dyspnea. EXAM: PORTABLE CHEST 1 VIEW COMPARISON:  01/09/2021 and 06/02/2018 FINDINGS: The heart size and mediastinal contours are within normal limits. Pulmonary hyperinflation again no,  consistent with COPD. Both lungs are clear. Rounded opacity is again seen in the right cardiophrenic angle. This is stable in appearance, and could be due to diaphragmatic/hiatal hernia, or prominent pericardial fat pad in this location. IMPRESSION: COPD. No active lung disease. Stable rounded opacity in right cardiophrenic angle, which could be due to diaphragmatic/hiatal hernia, or prominent epicardial fat pad. Electronically Signed   By: 06/04/2018 M.D.   On: 01/25/2021 16:30   CT HEAD CODE STROKE WO CONTRAST  Result Date: 01/25/2021 CLINICAL DATA:  Code stroke. Acute neuro deficit. Right-sided weakness EXAM: CT HEAD WITHOUT CONTRAST TECHNIQUE: Contiguous axial images were obtained from the base of the skull through the vertex without intravenous contrast. COMPARISON:  CT head 01/09/2021 FINDINGS: Brain: Generalized atrophy. Negative for hydrocephalus. Chronic microvascular ischemic changes in the white matter bilaterally unchanged. Chronic infarct in the right internal capsule unchanged. Hypodensity in the right occipital lobe has become lower density and better define compared to the prior study and is compatible with chronic infarct. Negative for acute cortical infarct, hemorrhage, mass Vascular: Negative for hyperdense vessel Skull: Scattered small lytic lesions in the calvarium bilaterally  unchanged from the prior study. These all measure approximately 5 mm in diameter. Sinuses/Orbits: Mild mucosal edema paranasal sinuses. Other: None ASPECTS (Alberta Stroke Program Early CT Score) - Ganglionic level infarction (caudate, lentiform nuclei, internal capsule, insula, M1-M3 cortex): 7 - Supraganglionic infarction (M4-M6 cortex): 3 Total score (0-10 with 10 being normal): 10 IMPRESSION: 1. No acute infarct identified 2. ASPECTS is 10 3. Atrophy and chronic microvascular ischemia. Chronic infarct right occipital lobe. 4. Code stroke imaging results were communicated on 01/25/2021 at 3:20 pm to provider [place]  via text page 5. Multiple small lytic lesions in the calvarium. While these could be benign, consider myeloma. Electronically Signed   By: Marlan Palau M.D.   On: 01/25/2021 15:22   CT ANGIO HEAD NECK W WO CM (CODE STROKE)  Result Date: 01/25/2021 CLINICAL DATA:  Acute neuro deficit.  Right-sided weakness. EXAM: CT ANGIOGRAPHY HEAD AND NECK TECHNIQUE: Multidetector CT imaging of the head and neck was performed using the standard protocol during bolus administration of intravenous contrast. Multiplanar CT image reconstructions and MIPs were obtained to evaluate the vascular anatomy. Carotid stenosis measurements (when applicable) are obtained utilizing NASCET criteria, using the distal internal carotid diameter as the denominator. CONTRAST:  15mL OMNIPAQUE IOHEXOL 350 MG/ML SOLN COMPARISON:  CT head 01/25/2021 FINDINGS: CTA NECK FINDINGS Aortic arch: Mild atherosclerotic disease in the aortic arch. Proximal great vessels widely patent. Right carotid system: Right carotid widely patent without significant stenosis. Left carotid system: Left carotid widely patent without significant stenosis. Vertebral arteries: Both vertebral arteries are widely patent to the basilar without stenosis. Skeleton: Cervical spondylosis.  No acute abnormality. Other neck: Negative for mass or adenopathy in the neck. Upper chest: Lung apices clear bilaterally. Review of the MIP images confirms the above findings CTA HEAD FINDINGS Anterior circulation: Cavernous carotid widely patent bilaterally. Anterior and middle cerebral arteries patent bilaterally without stenosis or large vessel occlusion. Posterior circulation: Both vertebral arteries patent to the basilar. PICA patent bilaterally. Basilar patent with mild atherosclerotic irregularity. Anterior middle cerebral arteries patent bilaterally. Mild stenosis in the right P2 segment. No large vessel occlusion. Venous sinuses: Normal venous enhancement. Anatomic variants: None Review of  the MIP images confirms the above findings IMPRESSION: 1. Negative for intracranial large vessel occlusion 2. No significant carotid or vertebral artery stenosis in the neck. Electronically Signed   By: Marlan Palau M.D.   On: 01/25/2021 15:37   PHYSICAL EXAM Constitutional: Frail elderly Caucasian lady not in distress appears well-developed and well-nourished.  Psych: Affect appropriate to situation Eyes: No scleral injection HENT: No OP obstrucion Head: Normocephalic.  Cardiovascular: Normal rate and regular rhythm.  Respiratory: Labored breathing and breath sounds normal to anterior ascultation GI: Soft.  No distension. There is no tenderness.  Skin: WDI  Neuro: Mental Status: Patient is awake, alert, oriented to person, place, month, year, and situation. No dysarthria.  Follows commands well No signs of aphasia or neglect Cranial Nerves: II: Visual Fields are full. Pupils are equal, round, and reactive to light.   III,IV, VI: EOMI without ptosis or diploplia.  Slight decrease in left peripheral vision left eye temporal field. V: Facial sensation is symmetric to temperature VII: Facial movement is symmetric.  VIII: hearing is intact to voice X: Uvula elevates symmetrically XI: Shoulder shrug is symmetric. XII: tongue is midline without atrophy or fasciculations.  Motor: Tone is normal. Bulk is normal. 5/5 strength was present on left upper and left lower. Right arm and leg 4/5 with leg greater than  arm weakness.  Diminished fine finger movements on the right and orbits left over right upper extremity. Sensory: Sensation is symmetric to light touch and temperature in the arms and legs. Cerebellar: FNF and HKS are intact bilaterally  ASSESSMENT/PLAN Ms. LATRELL REITAN is a 87 y.o. female with history of  presenting with sudden onset of right-sided weakness. NIH3. Not a candidate for thrombectomy due to poor baseline modified Rankin within time window for IV tPA-given IV tPA.    Suspect right brain subcortical stroke with sudden onset of right sided weakness s/p tPA awaiting further work up.   Code Stroke CTH:  No acute abnormality Atrophy and chronic microvascular ischemia. Chronic infarct right occiptal lobe.  CTA head & neck: No LVO  MRI: Small acute and subacute infarcts involving bilateral basal ganglia and corona radiata. Small to moderate size infarct with petechial hemorrhage involving the right occipitoparietal lobes.  Mild chronic microvascular ischemic changes.  2D Echo: Pending   LDL 123  HgbA1c 6.9  VTE prophylaxis - SCDs    Diet   Diet heart healthy/carb modified Room service appropriate? Yes; Fluid consistency: Thin   No home AC/AP prior to admission  Stroke prevention plan pending work up  Therapy recommendations: Likely home   Disposition:  TBD  Consider transfer out of ICU later today   Hypertension  Home meds: Coreg 3.125 BID   Stable . BP goal 120-140 post tPA in the first 24 hours   Hyperlipidemia  Home meds:  Zocor 20mg    LDL 123, goal < 70  High intensity statin for age group  Continue statin at discharge  Diabetes type II Controlled  Home meds:  None, appears to have been diagnosed 9 mos ago  HgbA1c 6.9, goal < 7.0  CBGs Recent Labs    01/25/21 1459 01/26/21 0746  GLUCAP 153* 118*      SSI  Close PCP follow up  Other Stroke Risk Factors  Advanced Age >/= 14   Former Cigarette smoker, stopped 62 years ago  Obesity, Body mass index is 31.18 kg/m., BMI >/= 30 associated with increased stroke risk, recommend weight loss, diet and exercise as appropriate   Coronary artery disease  Migraines  Other Active Problems Hospital day # 1 I have personally obtained history,examined this patient, reviewed notes, independently viewed imaging studies, participated in medical decision making and plan of care.ROS completed by me personally and pertinent positives fully documented  I have made any  additions or clarifications directly to the above note. Agree with note above.  She presented with sudden onset of left hemiparesis likely due to right brain subcortical infarct and received IV tPA and subsequent substantial improvement.  Still has mild right leg weakness.  Continue close neurological monitoring and strict blood pressure control as per post tPA protocol.  Continue ongoing stroke work-up.  Mobilize out of bed.  Therapy consults.  Speech therapy for swallow eval.  Check MRI scan later today and aspirin after that if no bleed.  No family at the bedside for discussion.This patient is critically ill and at significant risk of neurological worsening, death and care requires constant monitoring of vital signs, hemodynamics,respiratory and cardiac monitoring, extensive review of multiple databases, frequent neurological assessment, discussion with family, other specialists and medical decision making of high complexity.I have made any additions or clarifications directly to the above note.This critical care time does not reflect procedure time, or teaching time or supervisory time of PA/NP/Med Resident etc but could involve care discussion time.  I spent 30 minutes of neurocritical care time  in the care of  this patient.      Delia HeadyPramod Valetta Mulroy, MD Medical Director [facility] Pager: [phone] 01/26/2021 4:30 PM    To contact Stroke Continuity provider, please refer to [email or web address]. After hours, contact General Neurology

## 2021-01-26 NOTE — Progress Notes (Signed)
OT Cancellation Note  Patient Details Name: Sue [name] MRN: [id number] DOB: 07-11-1930   Cancelled Treatment:    Reason Eval/Treat Not Completed: Active bedrest order (Will return as schedule allows)  Izora Ribas, OTDS  01/26/2021, 8:06 AM

## 2021-01-26 NOTE — Progress Notes (Signed)
PT Cancellation Note  Patient Details Name: JEMMIE LEDGERWOOD MRN: [id number] DOB: [birth year]   Cancelled Treatment:    Reason Eval/Treat Not Completed: Active bedrest order remains this morning. PT will continue to follow and evaluate as appropriate.  Deland Pretty, DPT   Acute Rehabilitation Department Pager #: [phone]   Gaetana Michaelis 01/26/2021, 7:46 AM

## 2021-01-26 NOTE — Plan of Care (Signed)
  Problem: Education: Goal: Knowledge of disease or condition will improve Outcome: Progressing Goal: Knowledge of secondary prevention will improve Outcome: Progressing Goal: Knowledge of patient specific risk factors addressed and post discharge goals established will improve Outcome: Progressing   Problem: Coping: Goal: Will verbalize positive feelings about self Outcome: Progressing Goal: Will identify appropriate support needs Outcome: Progressing   Problem: Health Behavior/Discharge Planning: Goal: Ability to manage health-related needs will improve Outcome: Progressing   Problem: Self-Care: Goal: Ability to participate in self-care as condition permits will improve Outcome: Progressing Goal: Ability to communicate needs accurately will improve Outcome: Progressing   Problem: Nutrition: Goal: Risk of aspiration will decrease Outcome: Progressing Goal: Dietary intake will improve Outcome: Progressing   Problem: Ischemic Stroke/TIA Tissue Perfusion: Goal: Complications of ischemic stroke/TIA will be minimized Outcome: Progressing

## 2021-01-27 ENCOUNTER — Encounter (HOSPITAL_COMMUNITY): Payer: Self-pay | Admitting: Student in an Organized Health Care Education/Training Program

## 2021-01-27 DIAGNOSIS — I6389 Other cerebral infarction: Secondary | ICD-10-CM

## 2021-01-27 LAB — GLUCOSE, CAPILLARY
Glucose-Capillary: 115 mg/dL — ABNORMAL HIGH (ref 70–99)
Glucose-Capillary: 130 mg/dL — ABNORMAL HIGH (ref 70–99)
Glucose-Capillary: 117 mg/dL — ABNORMAL HIGH (ref 70–99)
Glucose-Capillary: 124 mg/dL — ABNORMAL HIGH (ref 70–99)

## 2021-01-27 MED ORDER — CLOPIDOGREL BISULFATE 75 MG PO TABS
75.0000 mg | ORAL_TABLET | Freq: Every day | ORAL | Status: DC
  Administered 2021-01-27 – 2021-02-01 (×6): 75 mg via ORAL
  Filled 2021-01-27 (×6): qty 1

## 2021-01-27 MED ORDER — SIMVASTATIN 20 MG PO TABS
20.0000 mg | ORAL_TABLET | Freq: Every day | ORAL | Status: DC
  Administered 2021-01-27 – 2021-01-30 (×4): 20 mg via ORAL
  Filled 2021-01-27 (×4): qty 1

## 2021-01-27 MED ORDER — PANTOPRAZOLE SODIUM 40 MG PO TBEC
40.0000 mg | DELAYED_RELEASE_TABLET | Freq: Every day | ORAL | Status: DC
  Administered 2021-01-27 – 2021-01-31 (×5): 40 mg via ORAL
  Filled 2021-01-27 (×5): qty 1

## 2021-01-27 MED ORDER — SIMVASTATIN 20 MG PO TABS: 40.0000 mg | ORAL_TABLET | Freq: Every day | ORAL | Status: DC

## 2021-01-27 NOTE — Consult Note (Addendum)
ELECTROPHYSIOLOGY CONSULT NOTE  Patient ID: Sue [name] MRN: [id number], DOB/AGE: May 11, 1930   Admit date: 01/25/2021 Date of Consult: 01/27/2021  Primary Physician: Sigmund Hazel, MD Primary Cardiologist: Dr. Cristal Deer Reason for Consultation: Cryptogenic stroke ; recommendations regarding Implantable Loop Recorder, requested by Dr. Pearlean Brownie  History of Present Illness Sue [name] was admitted on 01/25/2021 with R sided weakness, stroke, given IV tPA.    PMHx includes: RBBB, chronic combined CHF, DM, HLD, hypothyroidism   Neurology notes/MRI Small acute and subacute infarcts involving bilateral basal ganglia and corona radiata. Small to moderate size infarct with petechial hemorrhage involving the right occipitoparietal lobes.  she has undergone workup for stroke including echocardiogram and carotid dopplers.  The patient has been monitored on telemetry which has demonstrated sinus rhythm with no arrhythmias.  Neurology has deferred TEE.   Echocardiogram this admission demonstrated    IMPRESSIONS  1. Left ventricular ejection fraction, by estimation, is 55 to 60%. The  left ventricle has normal function. Left ventricular diastolic parameters  are consistent with Grade I diastolic dysfunction (impaired relaxation).  2. Right ventricular systolic function is normal. The right ventricular  size is normal. There is normal pulmonary artery systolic pressure. The  estimated right ventricular systolic pressure is 30.7 mmHg.  3. The mitral valve is normal in structure. No evidence of mitral valve  regurgitation. No evidence of mitral stenosis.  4. Aortic valve regurgitation is not visualized. No aortic stenosis is  present.  5. The inferior vena cava is dilated in size with >50% respiratory  variability, suggesting right atrial pressure of 8 mmHg.   Lab work is reviewed. 5/22 WBC 12, afebrile   Prior to admission, the patient denies chest pain, shortness of breath,  dizziness, palpitations, or syncope.  They are recovering from their stroke with plans to home at discharge.      Past Medical History:  Diagnosis Date  . CHF (congestive heart failure) (HCC)   . Diabetes mellitus without complication (HCC)   . Hyperlipidemia   . Thyroid disease      Surgical History: History reviewed. No pertinent surgical history.   Medications Prior to Admission  Medication Sig Dispense Refill Last Dose  . acetaminophen (TYLENOL) 500 MG tablet Take 1,000 mg by mouth every 6 (six) hours as needed for mild pain.   unknown  . buPROPion (WELLBUTRIN SR) 150 MG 12 hr tablet Take 150 mg by mouth daily.   01/25/2021 at Unknown time  . carvedilol (COREG) 3.125 MG tablet TAKE TWO TABLETS BY MOUTH TWICE A DAY (Patient taking differently: 3.125 mg 2 (two) times daily with a meal.) 180 tablet 0 01/25/2021 at 1000  . clonazePAM (KLONOPIN) 0.5 MG tablet Take 0.25-0.5 mg by mouth 2 (two) times daily as needed for anxiety.   01/24/2021 at Unknown time  . furosemide (LASIX) 40 MG tablet Take 40 mg by mouth 2 (two) times daily.   01/25/2021 at Unknown time  . levothyroxine (SYNTHROID, LEVOTHROID) 112 MCG tablet Take 112 mcg by mouth daily before breakfast.   01/25/2021 at Unknown time  . nitrofurantoin, macrocrystal-monohydrate, (MACROBID) 100 MG capsule Take 100 mg by mouth once.   01/24/2021 at Unknown time  . potassium chloride (K-DUR,KLOR-CON) 10 MEQ tablet Take 10 mEq by mouth 2 (two) times daily.    01/25/2021 at Unknown time  . simvastatin (ZOCOR) 20 MG tablet Take 20 mg by mouth daily.   01/24/2021 at Unknown time    Inpatient Medications:  .  stroke: mapping our  early stages of recovery book   Does not apply Once  . aspirin  81 mg Oral Daily  . Chlorhexidine Gluconate Cloth  6 each Topical Daily  . Gerhardt's butt cream   Topical BID  . insulin aspart  0-9 Units Subcutaneous TID WC  . levothyroxine  112 mcg Oral QAC breakfast  . mouth rinse  15 mL Mouth Rinse BID  .  pantoprazole  40 mg Oral QHS  . simvastatin  20 mg Oral Daily    Allergies: No Known Allergies  Social History   Socioeconomic History  . Marital status: Widowed    Spouse name: Not on file  . Number of children: Not on file  . Years of education: Not on file  . Highest education level: Not on file  Occupational History  . Not on file  Tobacco Use  . Smoking status: Former Smoker    Types: Cigarettes    Quit date: 09/06/1958    Years since quitting: 62.4  . Smokeless tobacco: Never Used  Vaping Use  . Vaping Use: Never used  Substance and Sexual Activity  . Alcohol use: No  . Drug use: No  . Sexual activity: Never  Other Topics Concern  . Not on file  Social History Narrative  . Not on file   Social Determinants of Health   Financial Resource Strain: Low Risk   . Difficulty of Paying Living Expenses: Not very hard  Food Insecurity: No Food Insecurity  . Worried About Programme researcher, broadcasting/film/video in the Last Year: Never true  . Ran Out of Food in the Last Year: Never true  Transportation Needs: Unmet Transportation Needs  . Lack of Transportation (Medical): Yes  . Lack of Transportation (Non-Medical): Yes  Physical Activity: Not on file  Stress: Not on file  Social Connections: Not on file  Intimate Partner Violence: Not on file     Family History  Problem Relation Age of Onset  . Diabetes Mother       Review of Systems: All other systems reviewed and are otherwise negative except as noted above.  Physical Exam: Vitals:   01/26/21 2151 01/26/21 2343 01/27/21 0340 01/27/21 0812  BP:  113/62 (!) 143/71 122/62  Pulse:  73 69 67  Resp:  18 18 17   Temp:  98.2 F (36.8 C) (!) 97.5 F (36.4 C) 97.8 F (36.6 C)  TempSrc:  Oral Oral Oral  SpO2: 95% 95% 97% 94%  Weight:  90 kg    Height:  5\' 4"  (1.626 m)      GEN- The patient is well appearing, alert and oriented x 3 today.   Head- normocephalic, atraumatic Eyes-  Sclera clear, conjunctiva pink Ears- hearing  intact Oropharynx- clear Neck- supple Lungs- CTA b/l, normal work of breathing Heart- RRR, no murmurs, rubs or gallops  GI- soft, NT, ND Extremities- no clubbing, cyanosis, or edema MS- no significant deformity or atrophy Skin- no rash or lesion Psych- euthymic mood, full affect   Labs:   Lab Results  Component Value Date   WBC 12.0 (H) 01/25/2021   HGB 17.7 (H) 01/25/2021   HCT 52.0 (H) 01/25/2021   MCV 95.6 01/25/2021   PLT 654 (H) 01/25/2021    Recent Labs  Lab 01/26/21 0439  NA 134*  K 3.5  CL 98  CO2 27  BUN 11  CREATININE 1.06*  CALCIUM 8.7*  PROT 6.3*  BILITOT 0.7  ALKPHOS 72  ALT 31  AST 37  GLUCOSE 117*  No results found for: CKTOTAL, CKMB, CKMBINDEX, TROPONINI Lab Results  Component Value Date   CHOL 182 01/26/2021   Lab Results  Component Value Date   HDL 25 (L) 01/26/2021   Lab Results  Component Value Date   LDLCALC 123 (H) 01/26/2021   Lab Results  Component Value Date   TRIG 168 (H) 01/26/2021   Lab Results  Component Value Date   CHOLHDL 7.3 01/26/2021   No results found for: LDLDIRECT  No results found for: DDIMER   Radiology/Studies:  CT HEAD WO CONTRAST Result Date: 01/09/2021 CLINICAL DATA:  Shortness of breath, weakness, focal neurological deficit for more than 6 hours suspected stroke, history CHF, diabetes mellitus EXAM: CT HEAD WITHOUT CONTRAST TECHNIQUE: Contiguous axial images were obtained from the base of the skull through the vertex without intravenous contrast. Sagittal and coronal MPR images reconstructed from axial data set. COMPARISON:  None FINDINGS: Brain: Generalized atrophy. Normal ventricular morphology. No midline shift or mass effect. Otherwise normal appearance of brain parenchyma. No intracranial hemorrhage, mass lesion, or evidence of acute infarction. No extra-axial fluid collections. Vascular: No hyperdense vessels. Minimally prominent basilar tip 5 mm diameter. Atherosclerotic calcifications of internal  carotid arteries at skull base Skull: Demineralized Sinuses/Orbits: Clear Other: N/A IMPRESSION: Atrophy with small vessel chronic ischemic changes of deep cerebral white matter. No acute intracranial abnormalities. Minimally prominent tip of basilar artery measuring 5 mm diameter. Electronically Signed   By: Ulyses Southward M.D.   On: 01/09/2021 12:46     MR BRAIN WO CONTRAST Result Date: 01/26/2021 CLINICAL DATA:  Code stroke follow-up EXAM: MRI HEAD WITHOUT CONTRAST TECHNIQUE: Multiplanar, multiecho pulse sequences of the brain and surrounding structures were obtained without intravenous contrast. COMPARISON:  Recent CT imaging FINDINGS: Brain: There are areas of acute and subacute infarction involving bilateral basal ganglia and corona radiata and right occipitoparietal lobes. There is evidence of petechial hemorrhage associated with the right occipitoparietal infarct. Additional patchy foci of T2 hyperintensity in the supratentorial and pontine white matter are nonspecific but probably reflect mild chronic microvascular ischemic changes. Prominence of the ventricles and sulci reflects generalized parenchymal volume loss. There is no intracranial mass or mass effect. There is no hydrocephalus or extra-axial fluid collection. Vascular: Major vessel flow voids at the skull base are preserved. Skull and upper cervical spine: Normal marrow signal is preserved. Sinuses/Orbits: Minor mucosal thickening. No significant orbital abnormality. Other: Sella is unremarkable.  Mastoid air cells are clear. IMPRESSION: Small acute and subacute infarcts involving bilateral basal ganglia and corona radiata. Small to moderate size infarct with petechial hemorrhage involving the right occipitoparietal lobes. Mild chronic microvascular ischemic changes. Electronically Signed   By: Guadlupe Spanish M.D.   On: 01/26/2021 14:27     CT HEAD CODE STROKE WO CONTRAST Result Date: 01/25/2021 CLINICAL DATA:  Code stroke. Acute neuro  deficit. Right-sided weakness EXAM: CT HEAD WITHOUT CONTRAST TECHNIQUE: Contiguous axial images were obtained from the base of the skull through the vertex without intravenous contrast. COMPARISON:  CT head 01/09/2021 FINDINGS: Brain: Generalized atrophy. Negative for hydrocephalus. Chronic microvascular ischemic changes in the white matter bilaterally unchanged. Chronic infarct in the right internal capsule unchanged. Hypodensity in the right occipital lobe has become lower density and better define compared to the prior study and is compatible with chronic infarct. Negative for acute cortical infarct, hemorrhage, mass Vascular: Negative for hyperdense vessel Skull: Scattered small lytic lesions in the calvarium bilaterally unchanged from the prior study. These all measure approximately 5 mm in  diameter. Sinuses/Orbits: Mild mucosal edema paranasal sinuses. Other: None ASPECTS (Alberta Stroke Program Early CT Score) - Ganglionic level infarction (caudate, lentiform nuclei, internal capsule, insula, M1-M3 cortex): 7 - Supraganglionic infarction (M4-M6 cortex): 3 Total score (0-10 with 10 being normal): 10 IMPRESSION: 1. No acute infarct identified 2. ASPECTS is 10 3. Atrophy and chronic microvascular ischemia. Chronic infarct right occipital lobe. 4. Code stroke imaging results were communicated on 01/25/2021 at 3:20 pm to provider Wilford CornerArora via text page 5. Multiple small lytic lesions in the calvarium. While these could be benign, consider myeloma. Electronically Signed   By: Marlan Palauharles  Clark M.D.   On: 01/25/2021 15:22   CT ANGIO HEAD NECK W WO CM (CODE STROKE) Result Date: 01/25/2021 CLINICAL DATA:  Acute neuro deficit.  Right-sided weakness. EXAM: CT ANGIOGRAPHY HEAD AND NECK TECHNIQUE: Multidetector CT imaging of the head and neck was performed using the standard protocol during bolus administration of intravenous contrast. Multiplanar CT image reconstructions and MIPs were obtained to evaluate the vascular  anatomy. Carotid stenosis measurements (when applicable) are obtained utilizing NASCET criteria, using the distal internal carotid diameter as the denominator. CONTRAST:  50mL OMNIPAQUE IOHEXOL 350 MG/ML SOLN COMPARISON:  CT head 01/25/2021 FINDINGS: CTA NECK FINDINGS Aortic arch: Mild atherosclerotic disease in the aortic arch. Proximal great vessels widely patent. Right carotid system: Right carotid widely patent without significant stenosis. Left carotid system: Left carotid widely patent without significant stenosis. Vertebral arteries: Both vertebral arteries are widely patent to the basilar without stenosis. Skeleton: Cervical spondylosis.  No acute abnormality. Other neck: Negative for mass or adenopathy in the neck. Upper chest: Lung apices clear bilaterally. Review of the MIP images confirms the above findings CTA HEAD FINDINGS Anterior circulation: Cavernous carotid widely patent bilaterally. Anterior and middle cerebral arteries patent bilaterally without stenosis or large vessel occlusion. Posterior circulation: Both vertebral arteries patent to the basilar. PICA patent bilaterally. Basilar patent with mild atherosclerotic irregularity. Anterior middle cerebral arteries patent bilaterally. Mild stenosis in the right P2 segment. No large vessel occlusion. Venous sinuses: Normal venous enhancement. Anatomic variants: None Review of the MIP images confirms the above findings IMPRESSION: 1. Negative for intracranial large vessel occlusion 2. No significant carotid or vertebral artery stenosis in the neck. Electronically Signed   By: Marlan Palauharles  Clark M.D.   On: 01/25/2021 15:37    12-lead ECG SR, RBBB All prior EKG's in EPIC reviewed with no documented atrial fibrillation  Telemetry SR  Assessment and Plan:  1. Cryptogenic stroke The patient presents with cryptogenic stroke.  Neurology has deferred TEE.  I spoke at length with the patient about monitoring for afib with either a 30 day event monitor  or an implantable loop recorder.  Risks, benefits, and alteratives to implantable loop recorder were discussed with the patient today.   At this time, the patient would like some time to think about monitoring and prefers to have an outpatient visit preferably with Dr. Cristal Deerhristopher to get her opinion, though her next available is not for about a month away, the patient does not want to do an event monitor in the meantime, our earliest EP appointment is next week, she is agreeable to this.  She was noted to have some petechial hemorrhage, if Afib is found early, will need to discuss a/c with neurology  Please call with questions.   Renee Norberto SorensonLynn Ursuy, PA-C 01/27/2021   I have seen, examined the patient, and reviewed the above assessment and plan.  Changes to above are made where necessary.  On exam, RRR.  She declines ILR implantation at this time.  We will arrange outpatient EP follow-up for further discussions.  Electrophysiology team to see as needed while here. Please call with questions.   Co Sign: Hillis Range, MD

## 2021-01-27 NOTE — TOC Initial Note (Signed)
Transition of Care [facility]) - Initial/Assessment Note    Patient Details  Name: Sue [name] MRN: [id number] Date of Birth: 1930-04-09  [facility]) CM/SW Contact:    Kermit Balo, RN Phone Number: 01/27/2021, 3:45 PM  Clinical Narrative:                 Patient lives at home with her son. She states he is there unless he is at work. Pt has walker, shower seat at home. Pt states her son does her medications at home.  Pt was active at home with [facility] PT[facility].  Today recommendations changed to inpatient rehab.  CM following for d/c needs/ disposition.  Expected Discharge Plan: IP Rehab Facility Barriers to Discharge: Continued Medical Work up   Patient Goals and CMS Choice     Choice offered to / list presented to : Patient  Expected Discharge Plan and Services Expected Discharge Plan: IP Rehab Facility   Discharge Planning Services: CM Consult Post Acute Care Choice: IP Rehab Living arrangements for the past 2 months: Apartment                                      Prior Living Arrangements/Services Living arrangements for the past 2 months: Apartment Lives with:: Adult Children (Son) Patient language and need for interpreter reviewed:: Yes Do you feel safe going back to the place where you live?: Yes        Care giver support system in place?: Yes (comment)   Criminal Activity/Legal Involvement Pertinent to Current Situation/Hospitalization: No - Comment as needed  Activities of Daily Living      Permission Sought/Granted                  Emotional Assessment Appearance:: Appears stated age Attitude/Demeanor/Rapport: Engaged Affect (typically observed): Accepting Orientation: : Oriented to Self,Oriented to Place,Oriented to  Time,Oriented to Situation   Psych Involvement: No (comment)  Admission diagnosis:  Stroke (cerebrum) (HCC) [I63.9] Acute ischemic stroke [facility]) [I63.9] Patient Active Problem List   Diagnosis  Date Noted  . Stroke (cerebrum) (HCC) 01/25/2021  . Acute on chronic diastolic CHF (congestive heart failure) (HCC) 01/10/2021  . Acute respiratory failure with hypoxemia (HCC) 01/10/2021  . Pneumonia 01/09/2021  . Osteoarthritis of ankle and foot 01/09/2021  . Chronic combined systolic and diastolic heart failure (HCC) 01/09/2021  . Chronic fatigue syndrome 01/09/2021  . Essential hypertension 01/09/2021  . Hyperlipidemia 01/09/2021  . Hypothyroidism 01/09/2021  . Diabetes (HCC) 04/14/2020   PCP:  Sigmund Hazel, MD Pharmacy:   [facility] [place], [place] - [address] [place] [address] Phone: [phone] Fax: [phone]     Social Determinants of Health (SDOH) Interventions    Readmission Risk Interventions No flowsheet data found.

## 2021-01-27 NOTE — Progress Notes (Signed)
Occupational Therapy Treatment Patient Details Name: Sue [name] MRN: [id number] DOB: December 28, 1929 Today's Date: 01/27/2021    History of present illness 87 yo female presenting to ED on 5/22 with R sided weakness and slurred speech. TPA administered 5/22 at 1314. Imaging revealed no acute abnormality. PMH includes: CHF, DM II, HLD< and thyroid disease.   OT comments  Pt continues to present with poor cognition, balance, strength, and safety. Noting this session, pt with poor ST memory, awareness, and problem solving requiring Min-Mod cues for performing ADLs. Pt completing toileting and hand hygiene with Min A and Mod cues. Due to poor insight and balance, pt is at risk for falls during ADLs. Update dc recommendation to CIR for intensive OT to optimize safety and independence with ADLs before return to home with son. Will continue to follow acutely as admitted.    Follow Up Recommendations  CIR;Supervision/Assistance - 24 hour    Equipment Recommendations  3 in 1 bedside commode    Recommendations for Other Services PT consult;Rehab consult    Precautions / Restrictions Precautions Precautions: Fall Precaution Comments: pt reports 2 falls in past 1 year Restrictions Weight Bearing Restrictions: No       Mobility Bed Mobility Overal bed mobility: Needs Assistance Bed Mobility: Supine to Sit     Supine to sit: Min assist     General bed mobility comments: Min A for trunk control. Max cues for sequencing    Transfers Overall transfer level: Needs assistance Equipment used: Rolling walker (2 wheeled) Transfers: Sit to/from [employer] Sit to Stand: Min assist         General transfer comment: Min A to initate power up and weight shift forward.    Balance Overall balance assessment: Needs assistance;History of Falls Sitting-balance support: Feet supported Sitting balance-Leahy Scale: Fair     Standing balance support: Bilateral upper extremity  supported;During functional activity Standing balance-Leahy Scale: Poor Standing balance comment: reliant on UE support                           ADL either performed or assessed with clinical judgement   ADL Overall ADL's : Needs assistance/impaired     Grooming: Minimal assistance;Cueing for sequencing;Standing Grooming Details (indicate cue type and reason): Min Guard-Min A for safety in standing at sink. Cues for sequencing and use of soap                 Toilet Transfer: Minimal assistance;Ambulation;Regular Toilet;Grab bars Toilet Transfer Details (indicate cue type and reason): Min A for safety descent and to power up into standing Toileting- Clothing Manipulation and Hygiene: Minimal assistance;Sitting/lateral lean Toileting - Clothing Manipulation Details (indicate cue type and reason): Min A for manage gown. Pt initatially not performing hygiene and requiring Mod cues to initate. Pt presenting with poor awareness and requiring Min A to manage gown for her to reach and perform peri care     Functional mobility during ADLs: Minimal assistance;Rolling walker General ADL Comments: Pt performing stand pivot to recliner and then forward mobility forward with RW. Pt presenting with decreased activity tolerance, balance, and strength     Vision       Perception     Praxis      Cognition Arousal/Alertness: Awake/alert Behavior During Therapy: WFL for tasks assessed/performed Overall Cognitive Status: Impaired/Different from baseline Area of Impairment: Following commands;Problem solving;Awareness;Safety/judgement;Memory;Attention  Current Attention Level: Sustained Memory: Decreased short-term memory Following Commands: Follows one step commands with increased time;Follows multi-step commands inconsistently Safety/Judgement: Decreased awareness of deficits Awareness: Emergent;Intellectual Problem Solving: Slow processing;Requires  verbal cues General Comments: Pt presenting with decreased ST memory. Not remembering therapist from yesterday or session. Also requestingtherapist find her phone which she states is in the white bags; however, cell phone in bed with her. When finishing toileting, pt requiring questioning cues to initate toilet hygiene. Pt also performing hand hygiene without soap - Min questioning cues for using soap.        Exercises     Shoulder Instructions       General Comments SpO2 >90% on RA    Pertinent Vitals/ Pain       Pain Assessment: Faces Faces Pain Scale: No hurt Pain Intervention(s): Monitored during session  Home Living                                          Prior Functioning/Environment              Frequency  Min 3X/week        Progress Toward Goals  OT Goals(current goals can now be found in the care plan section)  Progress towards OT goals: Progressing toward goals  Acute Rehab OT Goals Patient Stated Goal: "I am waiting for my breakfast" OT Goal Formulation: With patient Time For Goal Achievement: 02/09/21 Potential to Achieve Goals: Good ADL Goals Pt Will Perform Grooming: standing;with min guard assist Pt Will Perform Upper Body Dressing: with modified independence;sitting Pt Will Perform Lower Body Dressing: with min guard assist;sit to/from stand Pt Will Transfer to Toilet: with min guard assist;ambulating;regular height toilet Pt Will Perform Toileting - Clothing Manipulation and hygiene: with min guard assist;sit to/from stand;sitting/lateral leans  Plan Discharge plan remains appropriate    Co-evaluation                 AM-PAC OT "6 Clicks" Daily Activity     Outcome Measure   Help from another person eating meals?: A Little Help from another person taking care of personal grooming?: A Little Help from another person toileting, which includes using toliet, bedpan, or urinal?: A Little Help from another person bathing  (including washing, rinsing, drying)?: A Lot Help from another person to put on and taking off regular upper body clothing?: A Little Help from another person to put on and taking off regular lower body clothing?: A Lot 6 Click Score: 16    End of Session Equipment Utilized During Treatment: Rolling walker;Gait belt  OT Visit Diagnosis: Unsteadiness on feet (R26.81);Other abnormalities of gait and mobility (R26.89);Muscle weakness (generalized) (M62.81);History of falling (Z91.81)   Activity Tolerance Patient tolerated treatment well   Patient Left with call bell/phone within reach;in bed;with bed alarm set   Nurse Communication Mobility status        Time: 9702-6378 OT Time Calculation (min): 23 min  Charges: OT General Charges $OT Visit: 1 Visit OT Treatments $Self Care/Home Management : 23-37 mins  Vin Yonke MSOT, OTR/L Acute Rehab Pager: [phone] Office: [phone]   Theodoro Grist Isacc Turney 01/27/2021, 11:14 AM

## 2021-01-27 NOTE — Progress Notes (Signed)
Physical Therapy Treatment Patient Details Name: Sue [name] MRN: [id number] DOB: 1929/12/30 Today's Date: 01/27/2021    History of Present Illness 87 yo female presenting to ED on 5/22 with R sided weakness and slurred speech. TPA administered 5/22 at 1314.MRI shows amall acute and subacute infarcts involving bilateral basal ganglia and corona radiata, small to moderate size infarct with petechial hemorrhage involving the right occipitoparietal lobes. PMH includes: CHF, DM II, HLD, and thyroid disease.    PT Comments    Pt resting in bed upon arrival to room, agreeable to OOB mobility. Pt presents with several cognitive deficits this session, demonstrating poor sequencing, attention, and safety awareness requiring max cuing. Pt seems unphased by safety concerns, is however fearful of falling. Pt ambulatory for hallway distance with use of RW and min assist to steady/manage RW. PT feels pt would benefit from CIR post-acutely to maximize safety and independence prior to leaving hospital setting.     Follow Up Recommendations  Supervision for mobility/OOB;CIR     Equipment Recommendations  3in1 (PT)    Recommendations for Other Services       Precautions / Restrictions Precautions Precautions: Fall Precaution Comments: pt reports 2 falls in past 1 year Restrictions Weight Bearing Restrictions: No    Mobility  Bed Mobility Overal bed mobility: Needs Assistance Bed Mobility: Supine to Sit;Sit to Supine     Supine to sit: Min assist;HOB elevated Sit to supine: Min assist;HOB elevated   General bed mobility comments: min assist for trunk elevation/lowering, scooting to EOB, positioning upon return to supine. Increased time, step-by-step sequencing cues    Transfers Overall transfer level: Needs assistance Equipment used: Rolling walker (2 wheeled) Transfers: Sit to/from Stand Sit to Stand: Min assist         General transfer comment: min assist for initial power up  and steadying upon standing, STS x2 from EOB and toilet.  Ambulation/Gait Ambulation/Gait assistance: Min assist Gait Distance (Feet): 80 Feet Assistive device: Rolling walker (2 wheeled) Gait Pattern/deviations: Step-through pattern;Decreased stride length;Trunk flexed;Drifts right/left Gait velocity: decr   General Gait Details: min assist to steady, frequent tactile cuing for posture and positioning in RW. Pt weaving L and R in hallway, and stands in the R of her RW with no awareness of this. Pt bumping into objects requring cues to avoid.   Stairs             Wheelchair Mobility    Modified Rankin (Stroke Patients Only) Modified Rankin (Stroke Patients Only) Pre-Morbid Rankin Score: Moderately severe disability Modified Rankin: Moderately severe disability     Balance Overall balance assessment: Needs assistance;History of Falls Sitting-balance support: Feet supported Sitting balance-Leahy Scale: Fair     Standing balance support: Bilateral upper extremity supported;During functional activity Standing balance-Leahy Scale: Poor Standing balance comment: reliant on UE support                            Cognition Arousal/Alertness: Awake/alert Behavior During Therapy: WFL for tasks assessed/performed Overall Cognitive Status: Impaired/Different from baseline Area of Impairment: Following commands;Problem solving;Awareness;Safety/judgement;Memory;Attention                   Current Attention Level: Sustained Memory: Decreased short-term memory Following Commands: Follows one step commands with increased time;Follows multi-step commands inconsistently Safety/Judgement: Decreased awareness of deficits Awareness: Emergent;Intellectual Problem Solving: Slow processing;Requires verbal cues General Comments: Pt easily loses her train of thought, requires cues to sequence tasks (holding urine-soiled  toilet paper when standing in RW, forgot to flush  toilet).      Exercises      General Comments        Pertinent Vitals/Pain Pain Assessment: Faces Faces Pain Scale: No hurt Pain Intervention(s): Monitored during session    Home Living                      Prior Function            PT Goals (current goals can now be found in the care plan section) Acute Rehab PT Goals Patient Stated Goal: none stated PT Goal Formulation: With patient Time For Goal Achievement: 02/09/21 Potential to Achieve Goals: Fair Progress towards PT goals: Progressing toward goals    Frequency    Min 4X/week      PT Plan Discharge plan needs to be updated    Co-evaluation              AM-PAC PT "6 Clicks" Mobility   Outcome Measure  Help needed turning from your back to your side while in a flat bed without using bedrails?: A Little Help needed moving from lying on your back to sitting on the side of a flat bed without using bedrails?: A Lot Help needed moving to and from a bed to a chair (including a wheelchair)?: A Little Help needed standing up from a chair using your arms (e.g., wheelchair or bedside chair)?: A Little Help needed to walk in hospital room?: A Little Help needed climbing 3-5 steps with a railing? : A Lot 6 Click Score: 16    End of Session Equipment Utilized During Treatment: Gait belt Activity Tolerance: Patient tolerated treatment well Patient left: in chair;with call bell/phone within reach;with chair alarm set Nurse Communication: Mobility status PT Visit Diagnosis: Other abnormalities of gait and mobility (R26.89)     Time: 7893-8101 PT Time Calculation (min) (ACUTE ONLY): 24 min  Charges:  $Gait Training: 8-22 mins $Therapeutic Activity: 8-22 mins                    Sue [name], PT DPT Acute Rehabilitation Services Pager [phone]  Office [phone]    Sue [name] 01/27/2021, 5:01 PM

## 2021-01-27 NOTE — Evaluation (Signed)
Speech Language Pathology Evaluation Patient Details Name: Sue [name] MRN: [id number] DOB: 09/04/1930 Today's Date: 01/27/2021 Time: 8185-6314 SLP Time Calculation (min) (ACUTE ONLY): 22 min  Problem List:  Patient Active Problem List   Diagnosis Date Noted  . Stroke (cerebrum) (HCC) 01/25/2021  . Acute on chronic diastolic CHF (congestive heart failure) (HCC) 01/10/2021  . Acute respiratory failure with hypoxemia (HCC) 01/10/2021  . Pneumonia 01/09/2021  . Osteoarthritis of ankle and foot 01/09/2021  . Chronic combined systolic and diastolic heart failure (HCC) 01/09/2021  . Chronic fatigue syndrome 01/09/2021  . Essential hypertension 01/09/2021  . Hyperlipidemia 01/09/2021  . Hypothyroidism 01/09/2021  . Diabetes (HCC) 04/14/2020   Past Medical History:  Past Medical History:  Diagnosis Date  . CHF (congestive heart failure) (HCC)   . Diabetes mellitus without complication (HCC)   . Hyperlipidemia   . Thyroid disease    Past Surgical History: History reviewed. No pertinent surgical history. HPI:  87 yo female presenting to ED on 5/22 with R sided weakness and slurred speech s/p TPA. MRI revealed small acute and subacute infarcts involving bilateral basal ganglia and and corona radiata as well as small to moderate size infarct with petechial hemorrhage involving the R occipitoparietal lobes. Imaging revealed no acute abnormality. PMH includes: CHF, DM II, HLD< and thyroid disease.   Assessment / Plan / Recommendation Clinical Impression  Pt has hearing loss that requires multiple repetitions throughout testing, but pt demonstrates cognitive impairments even in moments when she can demonstrate via teach back that she has properly heard instructions. She scored 14/30 on the SLUMS with no awareness of difficulties during testing. In addition to awareness and self-monitoring, impairments also included divergent naming, orientation to time, complex problem solving, and  storage/recall. Pt will benefit from ongoing SLP f/u to address cognition.    SLP Assessment  SLP Recommendation/Assessment: Patient needs continued Speech Lanaguage Pathology Services SLP Visit Diagnosis: Cognitive communication deficit (R41.841)    Follow Up Recommendations  Inpatient Rehab    Frequency and Duration min 2x/week  2 weeks      SLP Evaluation Cognition  Overall Cognitive Status: Impaired/Different from baseline Arousal/Alertness: Awake/alert Orientation Level: Disoriented to time;Oriented to person;Oriented to place Attention: Sustained Sustained Attention: Appears intact Memory: Impaired Memory Impairment: Storage deficit;Retrieval deficit;Decreased recall of new information Awareness: Impaired Awareness Impairment: Intellectual impairment;Emergent impairment;Anticipatory impairment Problem Solving: Impaired Problem Solving Impairment: Verbal complex Safety/Judgment: Impaired       Comprehension  Auditory Comprehension Overall Auditory Comprehension: Impaired Commands: Impaired One Step Basic Commands: 75-100% accurate Complex Commands: 50-74% accurate Conversation: Simple Interfering Components: Hearing    Expression Expression Primary Mode of Expression: Verbal Verbal Expression Overall Verbal Expression: Impaired Initiation: No impairment Level of Generative/Spontaneous Verbalization: Sentence Naming: Impairment Divergent: Other (comment) (only 5 words in one minute)   Oral / Motor  Motor Speech Overall Motor Speech: Appears within functional limits for tasks assessed   GO                    Mahala Menghini., M.A. CCC-SLP Acute Rehabilitation Services Pager [phone] Office [phone]  01/27/2021, 3:56 PM

## 2021-01-27 NOTE — Plan of Care (Signed)
  Problem: Education: Goal: Knowledge of disease or condition will improve Outcome: Progressing Goal: Knowledge of secondary prevention will improve Outcome: Progressing Goal: Knowledge of patient specific risk factors addressed and post discharge goals established will improve Outcome: Progressing   Problem: Coping: Goal: Will verbalize positive feelings about self Outcome: Progressing Goal: Will identify appropriate support needs Outcome: Progressing   Problem: Health Behavior/Discharge Planning: Goal: Ability to manage health-related needs will improve Outcome: Progressing   Problem: Self-Care: Goal: Ability to participate in self-care as condition permits will improve Outcome: Progressing Goal: Ability to communicate needs accurately will improve Outcome: Progressing   Problem: Nutrition: Goal: Risk of aspiration will decrease Outcome: Progressing Goal: Dietary intake will improve Outcome: Progressing   Problem: Ischemic Stroke/TIA Tissue Perfusion: Goal: Complications of ischemic stroke/TIA will be minimized Outcome: Progressing   

## 2021-01-27 NOTE — Progress Notes (Signed)
STROKE TEAM PROGRESS NOTE   INTERVAL HISTORY  Afebrile. Vital signs stable. Bradycardia resolved.  Neuro exam unchanged.  Recommend loop recorder to evaluate for atrial fibrillation.  Patient would like more time to think about it.  She will follow-up with EP in 1 week following discharge from hospital.      Vitals:   01/26/21 2343 01/27/21 0340 01/27/21 0812 01/27/21 1156  BP: 113/62 (!) 143/71 122/62 130/67  Pulse: 73 69 67 68  Resp: 18 18 17 17   Temp: 98.2 F (36.8 C) (!) 97.5 F (36.4 C) 97.8 F (36.6 C) 97.9 F (36.6 C)  TempSrc: Oral Oral Oral Oral  SpO2: 95% 97% 94% 91%  Weight: 90 kg     Height: 5\' 4"  (1.626 m)      CBC:  Recent Labs  Lab 01/25/21 1500 01/25/21 1505  WBC 12.0*  --   NEUTROABS 8.2*  --   HGB 16.6* 17.7*  HCT 50.2* 52.0*  MCV 95.6  --   PLT 654*  --    Basic Metabolic Panel:  Recent Labs  Lab 01/25/21 1500 01/25/21 1505 01/26/21 0439  NA 134* 135 134*  K 3.1* 3.3* 3.5  CL 97* 99 98  CO2 23  --  27  GLUCOSE 151* 149* 117*  BUN 12 13 11   CREATININE 1.11* 1.00 1.06*  CALCIUM 9.2  --  8.7*  MG  --   --  1.9  PHOS  --   --  4.0   Lipid Panel:  Recent Labs  Lab 01/26/21 0439  CHOL 182  TRIG 168*  HDL 25*  CHOLHDL 7.3  VLDL 34  LDLCALC 01/28/21*   HgbA1c:  Recent Labs  Lab 01/26/21 0439  HGBA1C 6.9*   Urine Drug Screen: No results for input(s): LABOPIA, COCAINSCRNUR, LABBENZ, AMPHETMU, THCU, LABBARB in the last 168 hours.  Alcohol Level No results for input(s): ETH in the last 168 hours.  IMAGING   CT HEAD CODE STROKE WO CONTRAST Result Date: 01/25/2021  IMPRESSION:  1. No acute infarct identified  2. ASPECTS is 10  3. Atrophy and chronic microvascular ischemia. Chronic infarct right occipital lobe.    CT ANGIO HEAD NECK W WO CM (CODE STROKE) Result Date: 01/25/2021  IMPRESSION:  1. Negative for intracranial large vessel occlusion  2. No significant carotid or vertebral artery stenosis in the neck.   Echo Result Read:  01/27/2021 IMPRESSIONS  1. Left ventricular ejection fraction, by estimation, is 55 to 60%. The  left ventricle has normal function. Left ventricular diastolic parameters  are consistent with Grade I diastolic dysfunction (impaired relaxation).  2. Right ventricular systolic function is normal. The right ventricular  size is normal. There is normal pulmonary artery systolic pressure. The  estimated right ventricular systolic pressure is 30.7 mmHg.  3. The mitral valve is normal in structure. No evidence of mitral valve  regurgitation. No evidence of mitral stenosis.  4. Aortic valve regurgitation is not visualized. No aortic stenosis is  present.  5. The inferior vena cava is dilated in size with >50% respiratory  variability, suggesting right atrial pressure of 8 mmHg.   No results found. PHYSICAL EXAM Constitutional: Frail elderly Caucasian lady not in distress appears well-developed and well-nourished.  Psych: Affect appropriate to situation Eyes: No scleral injection HENT: No OP obstrucion Head: Normocephalic.  Cardiovascular: Normal rate and regular rhythm.  Respiratory: breath sounds normal to anterior ascultation GI: Soft.  No distension. There is no tenderness.  Skin: WDI  Neuro: Mental  Status: Patient is awake, alert, oriented to person, place, month, year, and situation. No dysarthria.  Follows commands well No signs of aphasia or neglect Cranial Nerves: II: Visual Fields are full. Pupils are equal, round, and reactive to light.   III,IV, VI: EOMI without ptosis or diploplia.   V: Facial sensation is symmetric to temperature VII: Facial movement is symmetric.  VIII: hearing is intact to voice X: Uvula elevates symmetrically XI: Shoulder shrug is symmetric. XII: tongue is midline without atrophy or fasciculations.  Motor: Tone is normal. Bulk is normal. 5/5 strength was present on left upper and left lower. Right arm and leg 4/5 with leg greater than arm  weakness.  Diminished fine finger movements on the right and orbits left over right upper extremity. Sensory: Sensation is symmetric to light touch and temperature in the arms and legs. Cerebellar: FNF and HKS are intact bilaterally  ASSESSMENT/PLAN Ms. LENAYA PIETSCH is a 87 y.o. female with history of  presenting with sudden onset of right-sided weakness. NIH3. Not a candidate for thrombectomy due to poor baseline modified Rankin within time window for IV tPA-given IV tPA.   Suspect right brain subcortical stroke with sudden onset of right sided weakness s/p tPA awaiting further work up.   Code Stroke CTH:  No acute abnormality Atrophy and chronic microvascular ischemia. Chronic infarct right occiptal lobe.  CTA head & neck: No LVO  MRI: Small acute and subacute infarcts involving bilateral basal ganglia and corona radiata. Small to moderate size infarct with petechial hemorrhage involving the right occipitoparietal lobes.  Mild chronic microvascular ischemic changes.  2D Echo: EF 55-60%, grade 1 diastolic dysfunction  LDL 123  HgbA1c 6.9  Loop recorder recommended: pt declined placement while inpatient. Has an appointment with EP next week.  VTE prophylaxis - SCDs  Diet: heart healthy  No home AC/AP prior to admission.  DAPT aspirin 81mg  daily and plavix for 3 weeks then aspirin alone  Stroke prevention plan pending work up  Therapy recommendations: CIR  Disposition:  TBD  Hypertension  Home meds: Coreg 3.125 BID   Stable . BP goal 120-140 post tPA in the first 24 hours   Hyperlipidemia  Home meds:  Zocor 20mg    LDL 123, goal < 70  High intensity statin for age group  Continue statin at discharge  Diabetes type II Controlled  Home meds:  None, appears to have been diagnosed 9 mos ago  HgbA1c 6.9, goal < 7.0  CBGs Recent Labs    01/26/21 2201 01/27/21 0607 01/27/21 1158  GLUCAP 117* 115* 130*      SSI  Close PCP follow up  Other Stroke  Risk Factors  Advanced Age >/= 46   Former Cigarette smoker, stopped 62 years ago  Obesity, Body mass index is 34.06 kg/m., BMI >/= 30 associated with increased stroke risk, recommend weight loss, diet and exercise as appropriate   Coronary artery disease  Migraines  Other Active Problems  Hypothyroidism: on synthroid 01/29/21 daily  Gerd: protonix 40mg  daily  I have personally obtained history,examined this patient, reviewed notes, independently viewed imaging studies, participated in medical decision making and plan of care.ROS completed by me personally and pertinent positives fully documented  I have made any additions or clarifications directly to the above note. Agree with note above.  Therapist recommend inpatient rehab and patient is willing.  I spoke to the patient and her son at the bedside and answered questions about her care.  Recommend loop recorder prior to  discharge and hopefully transfer to inpatient rehab in the next couple of days after insurance approval.  Greater than 50% time during this 25-minute visit was spent on counseling and coordination of care about her embolic stroke and answering questions.  Delia Heady, MD Medical Director [facility] Pager: [phone] 01/27/2021 3:55 PM   To contact Stroke Continuity provider, please refer to [email or web address]. After hours, contact General Neurology

## 2021-01-27 NOTE — Progress Notes (Signed)
Rehab Admissions Coordinator Note:  Patient was screened by Clois Dupes for appropriateness for an Inpatient Acute Rehab Consult per therapy recs change.  At this time, we are recommending Inpatient Rehab consult. I will place order per protocol.  Clois Dupes RN MSN 01/27/2021, 3:41 PM  I can be reached at [phone].

## 2021-01-28 ENCOUNTER — Encounter (HOSPITAL_COMMUNITY)
Admission: EM | Disposition: A | Discharge status: Rehabilitation Facility | Payer: Self-pay | Source: Home / Self Care | Attending: Neurology

## 2021-01-28 DIAGNOSIS — I639 Cerebral infarction, unspecified: Secondary | ICD-10-CM | POA: Diagnosis not present

## 2021-01-28 DIAGNOSIS — D72829 Elevated white blood cell count, unspecified: Secondary | ICD-10-CM

## 2021-01-28 DIAGNOSIS — E785 Hyperlipidemia, unspecified: Secondary | ICD-10-CM | POA: Diagnosis not present

## 2021-01-28 DIAGNOSIS — E669 Obesity, unspecified: Secondary | ICD-10-CM

## 2021-01-28 DIAGNOSIS — E1169 Type 2 diabetes mellitus with other specified complication: Secondary | ICD-10-CM

## 2021-01-28 DIAGNOSIS — I509 Heart failure, unspecified: Secondary | ICD-10-CM

## 2021-01-28 HISTORY — PX: LOOP RECORDER INSERTION: EP1214

## 2021-01-28 LAB — GLUCOSE, CAPILLARY
Glucose-Capillary: 124 mg/dL — ABNORMAL HIGH (ref 70–99)
Glucose-Capillary: 116 mg/dL — ABNORMAL HIGH (ref 70–99)
Glucose-Capillary: 154 mg/dL — ABNORMAL HIGH (ref 70–99)
Glucose-Capillary: 122 mg/dL — ABNORMAL HIGH (ref 70–99)

## 2021-01-28 SURGERY — LOOP RECORDER INSERTION

## 2021-01-28 MED ORDER — LIVING WELL WITH DIABETES BOOK
Freq: Once | Status: AC
  Filled 2021-01-28: qty 1

## 2021-01-28 MED ORDER — LIDOCAINE-EPINEPHRINE 1 %-1:100000 IJ SOLN
INTRAMUSCULAR | Status: DC | PRN
  Administered 2021-01-28: 20 mL

## 2021-01-28 MED ORDER — BUPROPION HCL ER (SR) 150 MG PO TB12
150.0000 mg | ORAL_TABLET | Freq: Two times a day (BID) | ORAL | Status: DC
  Administered 2021-01-28 – 2021-02-01 (×9): 150 mg via ORAL
  Filled 2021-01-28 (×11): qty 1

## 2021-01-28 MED ORDER — ENOXAPARIN SODIUM 40 MG/0.4ML IJ SOSY
40.0000 mg | PREFILLED_SYRINGE | INTRAMUSCULAR | Status: DC
  Administered 2021-01-28 – 2021-01-31 (×4): 40 mg via SUBCUTANEOUS
  Filled 2021-01-28 (×4): qty 0.4

## 2021-01-28 MED ORDER — LIDOCAINE-EPINEPHRINE 1 %-1:100000 IJ SOLN
INTRAMUSCULAR | Status: AC
  Filled 2021-01-28: qty 1

## 2021-01-28 SURGICAL SUPPLY — 2 items
MONITOR REVEAL LINQ II (Prosthesis & Implant Heart) ×2 IMPLANT
PACK LOOP INSERTION (CUSTOM PROCEDURE TRAY) ×2 IMPLANT

## 2021-01-28 NOTE — Progress Notes (Signed)
Pt has not voided today.  Bladder scan showed 254 ml.  Encouraged pt to drink more.  Dr. Pearlean Brownie notified via [facility] text message.

## 2021-01-28 NOTE — Progress Notes (Signed)
PT Cancellation Note  Patient Details Name: JAYLIAH BENETT MRN: [id number] DOB: 1930-06-21   Cancelled Treatment:    Reason Eval/Treat Not Completed: Patient at procedure or test/unavailable - pt not in room, will check back as schedule allows.  Marye Round, PT DPT Acute Rehabilitation Services Pager [phone]  Office [phone]    Truddie Coco 01/28/2021, 2:52 PM

## 2021-01-28 NOTE — Consult Note (Signed)
Physical Medicine and Rehabilitation Consult Reason for Consult: Right side weakness and slurred speech Referring Physician: Dr. Pearlean Brownie   HPI: Sue [name] is a 87 y.o. right-handed female with history of CHF, diabetes mellitus hyperlipidemia and thyroid disease.  History taken from chart review due to [facility].  Quit smoking 62 years ago.  Per chart review she lives with her children.  1 level home one-step to entry.  Independent with rolling walker prior to admission.  Good family support.  She presented on 01/25/2021 with right hemiparesis, dysarthria of acute onset.  CT/CTA of head and neck showed unremarkable for acute process.  Multiple small lytic lesions in the calvarium.  Chronic infarct right occipital lobe.  Patient did receive tPA.  MRI showed small acute and subacute infarct involving the bilateral basal ganglia and corona radiata.  Small to moderate size infarct with petechial hemorrhage involving the right occipital parietal lobes.  Echocardiogram with ejection fraction 55 to 60%, grade 1 diastolic dysfunction.  Admission chemistries unremarkable except sodium 134, potassium 3.1, glucose 151 creatinine 1.11, WBC 12,000, hemoglobin A1c 6.9.  Currently maintained on aspirin 81 mg daily and Plavix 75 mg daily for CVA prophylaxis x3 weeks and aspirin alone.  Awaiting plan for loop recorder placement..  Therapy evaluations completed due to patient's right hemiparesis, dysarthria recommendations of physical medicine rehab consult.  Review of Systems  Unable to perform ROS: Other  Extremely HOH.  Past Medical History:  Diagnosis Date  . CHF (congestive heart failure) (HCC)   . Diabetes mellitus without complication (HCC)   . Hyperlipidemia   . Thyroid disease    Past surgical history: None on file, limited availability from patient Family History  Problem Relation Age of Onset  . Diabetes Mother    Social History:  reports that she quit smoking about 62 years ago. Her smoking use  included cigarettes. She has never used smokeless tobacco. She reports that she does not drink alcohol and does not use drugs. Allergies: No Known Allergies Medications Prior to Admission  Medication Sig Dispense Refill  . acetaminophen (TYLENOL) 500 MG tablet Take 1,000 mg by mouth every 6 (six) hours as needed for mild pain.    Marland Kitchen buPROPion (WELLBUTRIN SR) 150 MG 12 hr tablet Take 150 mg by mouth daily.    . carvedilol (COREG) 3.125 MG tablet TAKE TWO TABLETS BY MOUTH TWICE A DAY (Patient taking differently: 3.125 mg 2 (two) times daily with a meal.) 180 tablet 0  . clonazePAM (KLONOPIN) 0.5 MG tablet Take 0.25-0.5 mg by mouth 2 (two) times daily as needed for anxiety.    . furosemide (LASIX) 40 MG tablet Take 40 mg by mouth 2 (two) times daily.    Marland Kitchen levothyroxine (SYNTHROID, LEVOTHROID) 112 MCG tablet Take 112 mcg by mouth daily before breakfast.    . nitrofurantoin, macrocrystal-monohydrate, (MACROBID) 100 MG capsule Take 100 mg by mouth once.    . potassium chloride (K-DUR,KLOR-CON) 10 MEQ tablet Take 10 mEq by mouth 2 (two) times daily.     . simvastatin (ZOCOR) 20 MG tablet Take 20 mg by mouth daily.      Home: Home Living Family/patient expects to be discharged to:: Private residence Living Arrangements: Children Available Help at Discharge: Family,Available 24 hours/day Type of Home: House Home Access: Level entry,Stairs to enter Entrance Stairs-Number of Steps: 1 Home Layout: One level Bathroom Shower/Tub: Engineer, manufacturing systems: Standard Home Equipment: Walker - 2 wheels,Shower seat,Grab bars - tub/shower,Hand held shower head  Functional History: Prior Function Level of Independence: Independent with assistive device(s) Comments: walked with RW Functional Status:  Mobility: Bed Mobility Overal bed mobility: Needs Assistance Bed Mobility: Supine to Sit,Sit to Supine Supine to sit: Min assist,HOB elevated Sit to supine: Min assist,HOB elevated General bed  mobility comments: min assist for trunk elevation/lowering, scooting to EOB, positioning upon return to supine. Increased time, step-by-step sequencing cues Transfers Overall transfer level: Needs assistance Equipment used: Rolling walker (2 wheeled) Transfers: Sit to/from Stand Sit to Stand: Min assist Stand pivot transfers: Min assist,+2 safety/equipment General transfer comment: min assist for initial power up and steadying upon standing, STS x2 from EOB and toilet. Ambulation/Gait Ambulation/Gait assistance: Min assist Gait Distance (Feet): 80 Feet Assistive device: Rolling walker (2 wheeled) Gait Pattern/deviations: Step-through pattern,Decreased stride length,Trunk flexed,Drifts right/left General Gait Details: min assist to steady, frequent tactile cuing for posture and positioning in RW. Pt weaving L and R in hallway, and stands in the R of her RW with no awareness of this. Pt bumping into objects requring cues to avoid. Gait velocity: decr Gait velocity interpretation: <1.31 ft/sec, indicative of household ambulator    ADL: ADL Overall ADL's : Needs assistance/impaired Eating/Feeding: Set up,Sitting Eating/Feeding Details (indicate cue type and reason): Pt eating breakfast once set up in recliner. No need for assistance to open containers and able to organize plate. Grooming: Minimal assistance,Cueing for sequencing,Standing Grooming Details (indicate cue type and reason): Min Guard-Min A for safety in standing at sink. Cues for sequencing and use of soap Upper Body Bathing: Set up,Supervision/ safety,Sitting Lower Body Bathing: Moderate assistance,Sit to/from stand Upper Body Dressing : Minimal assistance,Sitting Upper Body Dressing Details (indicate cue type and reason): donned second gown Lower Body Dressing: Maximal assistance,Sit to/from stand Lower Body Dressing Details (indicate cue type and reason): Unable to bend foward to safely don socks. Max A to don socks. Toilet  Transfer: Minimal assistance,Ambulation,Regular Toilet,Grab bars Statistician Details (indicate cue type and reason): Min A for safety descent and to power up into standing Toileting- Clothing Manipulation and Hygiene: Minimal assistance,Sitting/lateral lean Toileting - Clothing Manipulation Details (indicate cue type and reason): Min A for manage gown. Pt initatially not performing hygiene and requiring Mod cues to initate. Pt presenting with poor awareness and requiring Min A to manage gown for her to reach and perform peri care Functional mobility during ADLs: Minimal assistance,Rolling walker General ADL Comments: Pt performing stand pivot to recliner and then forward mobility forward with RW. Pt presenting with decreased activity tolerance, balance, and strength  Cognition: Cognition Overall Cognitive Status: Impaired/Different from baseline Arousal/Alertness: Awake/alert Orientation Level: Oriented X4 Attention: Sustained Sustained Attention: Appears intact Memory: Impaired Memory Impairment: Storage deficit,Retrieval deficit,Decreased recall of new information Awareness: Impaired Awareness Impairment: Intellectual impairment,Emergent impairment,Anticipatory impairment Problem Solving: Impaired Problem Solving Impairment: Verbal complex Safety/Judgment: Impaired Cognition Arousal/Alertness: Awake/alert Behavior During Therapy: WFL for tasks assessed/performed Overall Cognitive Status: Impaired/Different from baseline Area of Impairment: Following commands,Problem solving,Awareness,Safety/judgement,Memory,Attention Current Attention Level: Sustained Memory: Decreased short-term memory Following Commands: Follows one step commands with increased time,Follows multi-step commands inconsistently Safety/Judgement: Decreased awareness of deficits Awareness: Emergent,Intellectual Problem Solving: Slow processing,Requires verbal cues General Comments: Pt easily loses her train of  thought, requires cues to sequence tasks (holding urine-soiled toilet paper when standing in RW, forgot to flush toilet).  Blood pressure 123/64, pulse 72, temperature 97.9 F (36.6 C), temperature source Oral, resp. rate 18, height  (1.626 m), weight 90 kg, SpO2 96 %. Physical Exam Vitals reviewed.  Constitutional:  General: She is not in acute distress.    Appearance: She is obese.  HENT:     Head: Normocephalic and atraumatic.     Right Ear: External ear normal.     Left Ear: External ear normal.     Nose: Nose normal.  Eyes:     General:        Right eye: No discharge.        Left eye: No discharge.     Extraocular Movements: Extraocular movements intact.  Cardiovascular:     Rate and Rhythm: Normal rate and regular rhythm.  Pulmonary:     Effort: Pulmonary effort is normal. No respiratory distress.     Breath sounds: No stridor.     Comments: + [place] Abdominal:     General: Abdomen is flat. Bowel sounds are normal. There is no distension.  Musculoskeletal:     Cervical back: Normal range of motion and neck supple.     Comments: No edema or tenderness in extremities  Neurological:     Mental Status: She is alert.     Comments: Alert Makes eye contact with examiner.   Follows commands. Extremely to wait Motor: 5/5 throughout  Psychiatric:        Mood and Affect: Mood normal.        Behavior: Behavior is agitated.     Results for orders placed or performed during the hospital encounter of 01/25/21 (from the past 24 hour(s))  Glucose, capillary     Status: Abnormal   Collection Time: 01/27/21  6:07 AM  Result Value Ref Range   Glucose-Capillary 115 (H) 70 - 99 mg/dL   Comment 1 Notify RN    Comment 2 Document in Chart   Glucose, capillary     Status: Abnormal   Collection Time: 01/27/21 11:58 AM  Result Value Ref Range   Glucose-Capillary 130 (H) 70 - 99 mg/dL  Glucose, capillary     Status: Abnormal   Collection Time: 01/27/21  4:23 PM  Result Value Ref  Range   Glucose-Capillary 117 (H) 70 - 99 mg/dL  Glucose, capillary     Status: Abnormal   Collection Time: 01/27/21  9:07 PM  Result Value Ref Range   Glucose-Capillary 124 (H) 70 - 99 mg/dL   Comment 1 Notify RN    Comment 2 Document in Chart    MR BRAIN WO CONTRAST  Result Date: 01/26/2021 CLINICAL DATA:  Code stroke follow-up EXAM: MRI HEAD WITHOUT CONTRAST TECHNIQUE: Multiplanar, multiecho pulse sequences of the brain and surrounding structures were obtained without intravenous contrast. COMPARISON:  Recent CT imaging FINDINGS: Brain: There are areas of acute and subacute infarction involving bilateral basal ganglia and corona radiata and right occipitoparietal lobes. There is evidence of petechial hemorrhage associated with the right occipitoparietal infarct. Additional patchy foci of T2 hyperintensity in the supratentorial and pontine white matter are nonspecific but probably reflect mild chronic microvascular ischemic changes. Prominence of the ventricles and sulci reflects generalized parenchymal volume loss. There is no intracranial mass or mass effect. There is no hydrocephalus or extra-axial fluid collection. Vascular: Major vessel flow voids at the skull base are preserved. Skull and upper cervical spine: Normal marrow signal is preserved. Sinuses/Orbits: Minor mucosal thickening. No significant orbital abnormality. Other: Sella is unremarkable.  Mastoid air cells are clear. IMPRESSION: Small acute and subacute infarcts involving bilateral basal ganglia and corona radiata. Small to moderate size infarct with petechial hemorrhage involving the right occipitoparietal lobes. Mild chronic microvascular ischemic changes.  Electronically Signed   By: Guadlupe Spanish M.D.   On: 01/26/2021 14:27   ECHOCARDIOGRAM LIMITED  Result Date: 01/26/2021    ECHOCARDIOGRAM LIMITED REPORT   Patient Name:   REFUGIO MCCONICO Date of Exam: 01/26/2021 Medical Rec #:  [id number]       Height:       64.0 in Accession  #:    [id number]      Weight:       181.7 lb Date of Birth:  [birth year]       BSA:          1.878 m Patient Age:    89 years        BP:           134/77 mmHg Patient Gender: F               HR:           67 bpm. Exam Location:  Inpatient Procedure: Limited Echo, Cardiac Doppler and Color Doppler Indications:    CVA  History:        Patient has prior history of Echocardiogram examinations, most                 recent 01/12/2021. CHF, Stroke; Risk Factors:Hypertension,                 Dyslipidemia and Diabetes.  Sonographer:    Lavenia Atlas Referring Phys: [phone] DENISE A WOLFE IMPRESSIONS  1. Left ventricular ejection fraction, by estimation, is 55 to 60%. The left ventricle has normal function. Left ventricular diastolic parameters are consistent with Grade I diastolic dysfunction (impaired relaxation).  2. Right ventricular systolic function is normal. The right ventricular size is normal. There is normal pulmonary artery systolic pressure. The estimated right ventricular systolic pressure is 30.7 mmHg.  3. The mitral valve is normal in structure. No evidence of mitral valve regurgitation. No evidence of mitral stenosis.  4. Aortic valve regurgitation is not visualized. No aortic stenosis is present.  5. The inferior vena cava is dilated in size with >50% respiratory variability, suggesting right atrial pressure of 8 mmHg. FINDINGS  Left Ventricle: Left ventricular ejection fraction, by estimation, is 55 to 60%. The left ventricle has normal function. The left ventricular internal cavity size was normal in size. There is no left ventricular hypertrophy. Left ventricular diastolic parameters are consistent with Grade I diastolic dysfunction (impaired relaxation). Right Ventricle: The right ventricular size is normal. No increase in right ventricular wall thickness. Right ventricular systolic function is normal. There is normal pulmonary artery systolic pressure. The tricuspid regurgitant velocity is 2.38 m/s, and   with an assumed right atrial pressure of 8 mmHg, the estimated right ventricular systolic pressure is 30.7 mmHg. Left Atrium: Left atrial size was normal in size. Right Atrium: Right atrial size was normal in size. Mitral Valve: The mitral valve is normal in structure. Mild to moderate mitral annular calcification. No evidence of mitral valve stenosis. Tricuspid Valve: The tricuspid valve is normal in structure. Tricuspid valve regurgitation is trivial. Aortic Valve: Aortic valve regurgitation is not visualized. No aortic stenosis is present. Aorta: The aortic root is normal in size and structure. Venous: The inferior vena cava is dilated in size with greater than 50% respiratory variability, suggesting right atrial pressure of 8 mmHg. LEFT VENTRICLE PLAX 2D LVIDd:         4.50 cm  Diastology LVIDs:         3.10 cm  LV e'  medial:    3.70 cm/s LV PW:         1.10 cm  LV E/e' medial:  14.2 LV IVS:        1.00 cm  LV e' lateral:   4.79 cm/s LVOT diam:     2.00 cm  LV E/e' lateral: 11.0 LVOT Area:     3.14 cm  RIGHT VENTRICLE RV S prime:     5.98 cm/s LEFT ATRIUM         Index LA diam:    2.90 cm 1.54 cm/m   AORTA Ao Root diam: 2.90 cm MITRAL VALVE               TRICUSPID VALVE MV Area (PHT): 2.76 cm    TR Peak grad:   22.7 mmHg MV Decel Time: 275 msec    TR Vmax:        238.00 cm/s MV E velocity: 52.70 cm/s MV A velocity: 86.50 cm/s  SHUNTS MV E/A ratio:  0.61        Systemic Diam: 2.00 cm Marca Anconaalton Mclean MD Electronically signed by Marca Anconaalton Mclean MD Signature Date/Time: 01/26/2021/5:04:18 PM    Final     Assessment/Plan: Diagnosis: Acute and subacute infarct involving the bilateral basal ganglia and corona radiata with small to moderate size infarct involving the right occipital parietal lobes.   Stroke: Continue secondary stroke prophylaxis and Risk Factor Modification listed below:   Antiplatelet therapy:   Blood Pressure Management:  Continue current medication with prn's with permisive HTN per primary  team Statin Agent:   Diabetes management:   PT/OT for mobility, ADL training  Labs independently reviewed.  Records reviewed and summated above.  1. Does the need for close, 24 hr/day medical supervision in concert with the patient's rehab needs make it unreasonable for this patient to be served in a less intensive setting? Yes  2. Co-Morbidities requiring supervision/potential complications: CHF (Monitor in accordance with increased physical activity and avoid UE resistance excercises), DM (Monitor in accordance with exercise and adjust meds as necessary), hyperlipidemia, thyroid disease, leukocytosis (repeat labs, cont to monitor for signs and symptoms of infection, further workup if indicated).   3. Due to bowel management, safety, disease management and patient education, does the patient require 24 hr/day rehab nursing? Yes 4. Does the patient require coordinated care of a physician, rehab nurse, therapy disciplines of PT/OT/SLP to address physical and functional deficits in the context of the above medical diagnosis(es)? Yes Addressing deficits in the following areas: balance, endurance, locomotion, strength, transferring, bathing, dressing, toileting, cognition and psychosocial support 5. Can the patient actively participate in an intensive therapy program of at least 3 hrs of therapy per day at least 5 days per week? Potentially 6. The potential for patient to make measurable gains while on inpatient rehab is excellent 7. Anticipated functional outcomes upon discharge from inpatient rehab are modified independent and supervision  with PT, modified independent and supervision with OT, modified independent with SLP. 8. Estimated rehab length of stay to reach the above functional goals is: 5-7 days. 9. Anticipated discharge destination: Home 10. Overall Rehab/Functional Prognosis: good  RECOMMENDATIONS: This patient's condition is appropriate for continued rehabilitative care in the following  setting: CIR Patient has agreed to participate in recommended program. Yes Note that insurance prior authorization may be required for reimbursement for recommended care.  Comment: Rehab Admissions Coordinator to follow up.  I have personally performed a face to face diagnostic evaluation, including, but not limited to relevant  history and physical exam findings, of this patient and developed relevant assessment and plan.  Additionally, I have reviewed and concur with the physician assistant's documentation above.   Maryla Morrow, MD, ABPMR Mcarthur Rossetti Angiulli, PA-C 01/28/2021

## 2021-01-28 NOTE — Discharge Instructions (Signed)
Post procedure wound care instructions Keep incision clean and dry for 3 days. You can remove outer dressing tomorrow. Leave steri-strips (little pieces of tape) on until seen in the office for wound check appointment. Call the office ([phone]) for redness, drainage, swelling, or fever.  

## 2021-01-28 NOTE — Progress Notes (Signed)
Spoke with patient at the bedside. Patient states that she has diabetes and has known about it. HgA1C is 6.9%. Patient is very hard of hearing, states that she lost her hearing aid. Have ordered Living Well with Diabetes booklet, gave patient information on the "plate method" and pamphlet on diabetes care. Patient states that she lives with her son. Only able to talk with patient briefly since she could not understand completely what I was saying.   Will continue to monitor blood sugars while in the hospital. Staff RN states that patient will be going to inpatient rehab unit.   Smith Mince RN BSN CDE Diabetes Coordinator Pager: [phone]  8am-5pm

## 2021-01-28 NOTE — Progress Notes (Signed)
STROKE TEAM PROGRESS NOTE   INTERVAL HISTORY  Afebrile. Vital signs stable. On supplemental oxygen this am.  Neuro exam unchanged.  Recommend loop recorder to evaluate for atrial fibrillation.  The patient would like this placed while she is in the hospital.  EP team made aware of patient's request.  Patient states that she has had decrease hearing in there left ear.  We reassured her that this is not related to her recent stroke.  I called the ENT operator and asked for an inpatient consult to evaluate her hearing loss.  Therapist recommend inpatient rehab.  Awaiting insurance approval.   Neurological exam is unchanged.  Vital signs are stable.  Vitals:   01/27/21 2330 01/28/21 0407 01/28/21 0749 01/28/21 1139  BP: 112/64 123/64 117/68 129/71  Pulse: 70 72 68 71  Resp: 18 18 16 16   Temp: 97.9 F (36.6 C) 98 F (36.7 C) 98.1 F (36.7 C) (!) 97.4 F (36.3 C)  TempSrc: Oral Oral Oral Oral  SpO2: 95% 96% 94% 96%  Weight:      Height:       CBC:  Recent Labs  Lab 01/25/21 1500 01/25/21 1505  WBC 12.0*  --   NEUTROABS 8.2*  --   HGB 16.6* 17.7*  HCT 50.2* 52.0*  MCV 95.6  --   PLT 654*  --    Basic Metabolic Panel:  Recent Labs  Lab 01/25/21 1500 01/25/21 1505 01/26/21 0439  NA 134* 135 134*  K 3.1* 3.3* 3.5  CL 97* 99 98  CO2 23  --  27  GLUCOSE 151* 149* 117*  BUN 12 13 11   CREATININE 1.11* 1.00 1.06*  CALCIUM 9.2  --  8.7*  MG  --   --  1.9  PHOS  --   --  4.0   Lipid Panel:  Recent Labs  Lab 01/26/21 0439  CHOL 182  TRIG 168*  HDL 25*  CHOLHDL 7.3  VLDL 34  LDLCALC *   HgbA1c:  Recent Labs  Lab 01/26/21 0439  HGBA1C 6.9*   Urine Drug Screen: No results for input(s): LABOPIA, COCAINSCRNUR, LABBENZ, AMPHETMU, THCU, LABBARB in the last 168 hours.  Alcohol Level No results for input(s): ETH in the last 168 hours.  IMAGING   CT HEAD CODE STROKE WO CONTRAST Result Date: 01/25/2021  IMPRESSION:  1. No acute infarct identified  2. ASPECTS is  10  3. Atrophy and chronic microvascular ischemia. Chronic infarct right occipital lobe.    CT ANGIO HEAD NECK W WO CM (CODE STROKE) Result Date: 01/25/2021  IMPRESSION:  1. Negative for intracranial large vessel occlusion  2. No significant carotid or vertebral artery stenosis in the neck.   Echo Result Read: 01/27/2021 IMPRESSIONS  1. Left ventricular ejection fraction, by estimation, is 55 to 60%. The  left ventricle has normal function. Left ventricular diastolic parameters  are consistent with Grade I diastolic dysfunction (impaired relaxation).  2. Right ventricular systolic function is normal. The right ventricular  size is normal. There is normal pulmonary artery systolic pressure. The  estimated right ventricular systolic pressure is 30.7 mmHg.  3. The mitral valve is normal in structure. No evidence of mitral valve  regurgitation. No evidence of mitral stenosis.  4. Aortic valve regurgitation is not visualized. No aortic stenosis is  present.  5. The inferior vena cava is dilated in size with >50% respiratory  variability, suggesting right atrial pressure of 8 mmHg.   No results found. PHYSICAL EXAM Constitutional: Frail elderly  Caucasian lady not in distress appears well-developed and well-nourished.  Psych: Affect appropriate to situation Eyes: No scleral injection HENT: No OP obstrucion Head: Normocephalic.  Cardiovascular: Normal rate and regular rhythm.  Respiratory: breath sounds normal to anterior ascultation GI: Soft.  No distension. There is no tenderness.  Skin: WDI  Neuro: Mental Status: Patient is awake, alert, oriented to person, place, month, year, and situation. No dysarthria.  Follows commands well No signs of aphasia or neglect Cranial Nerves: II: Visual Fields are full. Pupils are equal, round, and reactive to light.   III,IV, VI: EOMI without ptosis or diploplia.   V: Facial sensation is symmetric to temperature VII: Facial movement is  symmetric.  VIII: hearing is intact to voice X: Uvula elevates symmetrically XI: Shoulder shrug is symmetric. XII: tongue is midline without atrophy or fasciculations.  Motor: Tone is normal. Bulk is normal. 5/5 strength was present on left upper and left lower. Right arm and leg 4/5 with leg greater than arm weakness.  Diminished fine finger movements on the right and orbits left over right upper extremity. Sensory: Sensation is symmetric to light touch and temperature in the arms and legs. Cerebellar: FNF and HKS are intact bilaterally  ASSESSMENT/PLAN Sue [name] is a 87 y.o. female with history of  presenting with sudden onset of right-sided weakness. NIH3. Not a candidate for thrombectomy due to poor baseline modified Rankin within time window for IV tPA-given IV tPA.   Suspect right brain subcortical stroke with sudden onset of right sided weakness s/p tPA awaiting further work up.   Code Stroke CTH:  No acute abnormality Atrophy and chronic microvascular ischemia. Chronic infarct right occiptal lobe.  CTA head & neck: No LVO  MRI: Small acute and subacute infarcts involving bilateral basal ganglia and corona radiata. Small to moderate size infarct with petechial hemorrhage involving the right occipitoparietal lobes.  Mild chronic microvascular ischemic changes.  2D Echo: EF 55-60%, grade 1 diastolic dysfunction  LDL 123  HgbA1c 6.9  Loop recorder recommended: pt requesting loop placement while an inpatient  VTE prophylaxis - SCDs, lovenox  Diet: heart healthy  No home AC/AP prior to admission.  DAPT aspirin 81mg  daily and plavix for 3 weeks then aspirin alone  Stroke prevention plan pending work up  Therapy recommendations: CIR  Disposition:  TBD  Hypertension  Home meds: Coreg 3.125 BID   Stable . BP goal normotensive  Hyperlipidemia  Home meds:  Zocor 20mg    LDL 123, goal < 70  High intensity statin for age group  Continue statin at  discharge  Diabetes type II Controlled  Home meds:  None, appears to have been diagnosed 9 mos ago  HgbA1c 6.9, goal < 7.0  CBGs Recent Labs    01/27/21 2107 01/28/21 0610 01/28/21 1225  GLUCAP 124* 124* 116*      SSI  Diabetic coordinator consult  Close PCP follow up  Other Stroke Risk Factors  Advanced Age >/= 39   Former Cigarette smoker, stopped 62 years ago  Obesity, Body mass index is 34.06 kg/m., BMI >/= 30 associated with increased stroke risk, recommend weight loss, diet and exercise as appropriate   Coronary artery disease  Migraines  Other Active Problems  Hypothyroidism: on synthroid 01/30/21 daily  Gerd: protonix 40mg  daily  Depression: resume wellbutrin  Lissy Olivencia-Simmons, ACNP-BC Stroke NP 01/28/2021 12:45 PM Continue ongoing work-up.  Loop recorder placement prior to patient going to inpatient rehab after insurance approval.  Discussed with EP team  and rehab coordinator.  Greater than 50% time during this 25-minute visit were spent in counseling and coordination of care about stroke and need for loop recorder and answering questions and discussion with care team.  Delia Heady MD To contact Stroke Continuity provider, please refer to [email or web address]. After hours, contact General Neurology

## 2021-01-28 NOTE — Progress Notes (Addendum)
Progress Note  Patient Name: Sue [name] Date of Encounter: 01/28/2021  [facility] Cardiologist: Jodelle Red, MD   Subjective   Tearful watching news on school shooting, otherwise says she is doing OK, no CP, palpitations or SOB.  Feels like her hearing is worse today  Inpatient Medications    Scheduled Meds: .  stroke: mapping our early stages of recovery book   Does not apply Once  . aspirin  81 mg Oral Daily  . buPROPion  150 mg Oral BID  . Chlorhexidine Gluconate Cloth  6 each Topical Daily  . clopidogrel  75 mg Oral Daily  . enoxaparin (LOVENOX) injection  40 mg Subcutaneous Q24H  . Gerhardt's butt cream   Topical BID  . insulin aspart  0-9 Units Subcutaneous TID WC  . levothyroxine  112 mcg Oral QAC breakfast  . mouth rinse  15 mL Mouth Rinse BID  . pantoprazole  40 mg Oral QHS  . simvastatin  20 mg Oral q1800   Continuous Infusions: . sodium chloride Stopped (01/26/21 1835)   PRN Meds: acetaminophen **OR** acetaminophen (TYLENOL) oral liquid 160 mg/5 mL **OR** acetaminophen, senna-docusate   Vital Signs    Vitals:   01/27/21 2330 01/28/21 0407 01/28/21 0749 01/28/21 1139  BP: 112/64 123/64 117/68 129/71  Pulse: 70 72 68 71  Resp: 18 18 16 16   Temp: 97.9 F (36.6 C) 98 F (36.7 C) 98.1 F (36.7 C) (!) 97.4 F (36.3 C)  TempSrc: Oral Oral Oral Oral  SpO2: 95% 96% 94% 96%  Weight:      Height:        Intake/Output Summary (Last 24 hours) at 01/28/2021 1319 Last data filed at 01/28/2021 0945 Gross per 24 hour  Intake 570 ml  Output --  Net 570 ml   Last 3 Weights 01/26/2021 01/25/2021 01/25/2021  Weight (lbs) 198 lb 6.6 oz 181 lb 10.5 oz 181 lb 10.5 oz  Weight (kg) 90 kg 82.4 kg 82.4 kg      Telemetry    SR, occ/infrequent PACs - Personally Reviewed  ECG    No new EKGs - Personally Reviewed  Physical Exam   GEN: No acute distress.   Neck: No JVD Cardiac: RRR, no murmurs, rubs, or gallops.  Respiratory: CTA b/l. GI: Soft,  nontender, non-distended  MS: No edema; No deformity. Neuro:  Nonfocal  Psych: Normal affect   Labs    High Sensitivity Troponin:   Recent Labs  Lab 01/09/21 1027  TROPONINIHS 4      Chemistry Recent Labs  Lab 01/25/21 1500 01/25/21 1505 01/26/21 0439  NA 134* 135 134*  K 3.1* 3.3* 3.5  CL 97* 99 98  CO2 23  --  27  GLUCOSE 151* 149* 117*  BUN 12 13 11   CREATININE 1.11* 1.00 1.06*  CALCIUM 9.2  --  8.7*  PROT 7.4  --  6.3*  ALBUMIN 3.6  --  3.1*  AST 54*  --  37  ALT 38  --  31  ALKPHOS 84  --  72  BILITOT 0.9  --  0.7  GFRNONAA 48*  --  50*  ANIONGAP 14  --  9     Hematology Recent Labs  Lab 01/25/21 1500 01/25/21 1505  WBC 12.0*  --   RBC 5.25*  --   HGB 16.6* 17.7*  HCT 50.2* 52.0*  MCV 95.6  --   MCH 31.6  --   MCHC 33.1  --   RDW 14.7  --  PLT 654*  --     BNPNo results for input(s): BNP, PROBNP in the last 168 hours.   DDimer No results for input(s): DDIMER in the last 168 hours.   Radiology    MR BRAIN WO CONTRAST Result Date: 01/26/2021 CLINICAL DATA:  Code stroke follow-up EXAM: MRI HEAD WITHOUT CONTRAST TECHNIQUE: Multiplanar, multiecho pulse sequences of the brain and surrounding structures were obtained without intravenous contrast. COMPARISON:  Recent CT imaging FINDINGS: Brain: There are areas of acute and subacute infarction involving bilateral basal ganglia and corona radiata and right occipitoparietal lobes. There is evidence of petechial hemorrhage associated with the right occipitoparietal infarct. Additional patchy foci of T2 hyperintensity in the supratentorial and pontine white matter are nonspecific but probably reflect mild chronic microvascular ischemic changes. Prominence of the ventricles and sulci reflects generalized parenchymal volume loss. There is no intracranial mass or mass effect. There is no hydrocephalus or extra-axial fluid collection. Vascular: Major vessel flow voids at the skull base are preserved. Skull and upper  cervical spine: Normal marrow signal is preserved. Sinuses/Orbits: Minor mucosal thickening. No significant orbital abnormality. Other: Sella is unremarkable.  Mastoid air cells are clear. IMPRESSION: Small acute and subacute infarcts involving bilateral basal ganglia and corona radiata. Small to moderate size infarct with petechial hemorrhage involving the right occipitoparietal lobes. Mild chronic microvascular ischemic changes. Electronically Signed   By: Guadlupe Spanish M.D.   On: 01/26/2021 14:27    Cardiac Studies   01/26/21: TTE IMPRESSIONS  1. Left ventricular ejection fraction, by estimation, is 55 to 60%. The  left ventricle has normal function. Left ventricular diastolic parameters  are consistent with Grade I diastolic dysfunction (impaired relaxation).  2. Right ventricular systolic function is normal. The right ventricular  size is normal. There is normal pulmonary artery systolic pressure. The  estimated right ventricular systolic pressure is 30.7 mmHg.  3. The mitral valve is normal in structure. No evidence of mitral valve  regurgitation. No evidence of mitral stenosis.  4. Aortic valve regurgitation is not visualized. No aortic stenosis is  present.  5. The inferior vena cava is dilated in size with >50% respiratory  variability, suggesting right atrial pressure of 8 mmHg.    Patient Profile     87 y.o. female w/PMHx of RBBB, chronic combined CHF, DM, HLD, hypothyroidism admitted with stroke s/p tPA.  EP was consulted yesterday for consideration of loop implant, in our discussion yesterday she wanted to think about it and out patient follow up was arranged. We were made aware that she has decided to purse this, in communication with neurology APP, she is only pending insurance auth and placement.  Assessment & Plan    1. Cryptogenic stroke          The patient presented with cryptogenic stroke.  Neurology has deferred TEE.  I revisited today with the patient about  monitoring for afib with either a 30 day event monitor or an implantable loop recorder.  Risks, benefits, and alteratives to implantable loop recorder were discussed with the patient again today. She has had time to think about it and discussed again with the neurology team and is agreeable to proceed with loop implant.  We Angelea Penny plan for this today  Wound care was discussed,   For questions or updates, please contact [facility] Please consult [email or web address] for contact info under        Signed, Sheilah Pigeon, PA-C  01/28/2021, 1:19 PM    I have seen and examined  this patient with Francis Dowse.  Agree with above, note added to reflect my findings.  On exam, RRR, no murmurs.  Patient presented to the hospital with cryptogenic stroke. To date, no cause has been found. Shifa Brisbon plan for LINQ monitor to look for atrial fibrillation. Risks and benefits discussed. Risks include but not limited to bleeding and infection. The patient understands the risks and has agreed to the procedure.  Jayden Kratochvil M. Dayshia Ballinas MD 01/28/2021 2:36 PM

## 2021-01-28 NOTE — Plan of Care (Signed)
  Problem: Education: Goal: Knowledge of disease or condition will improve Outcome: Progressing Goal: Knowledge of secondary prevention will improve Outcome: Progressing Goal: Knowledge of patient specific risk factors addressed and post discharge goals established will improve Outcome: Progressing   Problem: Coping: Goal: Will verbalize positive feelings about self Outcome: Progressing Goal: Will identify appropriate support needs Outcome: Progressing   Problem: Health Behavior/Discharge Planning: Goal: Ability to manage health-related needs will improve Outcome: Progressing   Problem: Self-Care: Goal: Ability to participate in self-care as condition permits will improve Outcome: Progressing Goal: Ability to communicate needs accurately will improve Outcome: Progressing   Problem: Nutrition: Goal: Risk of aspiration will decrease Outcome: Progressing Goal: Dietary intake will improve Outcome: Progressing   Problem: Ischemic Stroke/TIA Tissue Perfusion: Goal: Complications of ischemic stroke/TIA will be minimized Outcome: Progressing   

## 2021-01-28 NOTE — Progress Notes (Signed)
Returned from procedural area at this time having a loop recorder. Site clean and dry.  No complaints.

## 2021-01-28 NOTE — Progress Notes (Addendum)
Inpatient Rehab Admissions Coordinator:   Met with patient at the bedside to discuss recommendations for CIR.  She appears a little confused (asking to be taken off the bedpan, though she is not on one), but reorients easily.  Agreeable to CIR, pending insurance authorization and states her son is with her at home.  I spoke to her son, Virgel Bouquet, over the phone and he confirms that he is able to provide 24/7 supervision for patient at discharge from [facility].  We also discussed need for insurance authorization, which I will start today.  Note formal consult from Dr. Posey Pronto pending. Will continue to follow for possible admit pending insurance approval, loop implantation, and bed availability.   Shann Medal, PT, DPT Admissions Coordinator [phone] 01/28/21  1:49 PM

## 2021-01-28 NOTE — Care Management Important Message (Signed)
Important Message  Patient Details  Name: Sue [name] MRN: [id number] Date of Birth: 1930/08/16   Medicare Important Message Given:  Yes     Lesleyann Fichter 01/28/2021, 2:05 PM

## 2021-01-29 ENCOUNTER — Encounter (HOSPITAL_COMMUNITY): Payer: Self-pay | Admitting: Cardiology

## 2021-01-29 LAB — GLUCOSE, CAPILLARY
Glucose-Capillary: 120 mg/dL — ABNORMAL HIGH (ref 70–99)
Glucose-Capillary: 113 mg/dL — ABNORMAL HIGH (ref 70–99)
Glucose-Capillary: 142 mg/dL — ABNORMAL HIGH (ref 70–99)
Glucose-Capillary: 150 mg/dL — ABNORMAL HIGH (ref 70–99)

## 2021-01-29 NOTE — Progress Notes (Signed)
Inpatient Rehab Admissions Coordinator:   I have no beds available for this patient to admit to CIR today.  Will continue to follow for timing of potential admission pending bed availability and insurance authorization.   Estill Dooms, PT, DPT Admissions Coordinator [phone] 01/29/21  12:02 PM

## 2021-01-29 NOTE — Plan of Care (Signed)
  Problem: Coping: Goal: Will verbalize positive feelings about self Outcome: Progressing Goal: Will identify appropriate support needs Outcome: Progressing   Problem: Ischemic Stroke/TIA Tissue Perfusion: Goal: Complications of ischemic stroke/TIA will be minimized Outcome: Progressing   Problem: Education: Goal: Knowledge of disease or condition will improve Outcome: Progressing Goal: Knowledge of secondary prevention will improve Outcome: Progressing Goal: Knowledge of patient specific risk factors addressed and post discharge goals established will improve Outcome: Progressing

## 2021-01-29 NOTE — Plan of Care (Signed)
  Problem: Education: Goal: Knowledge of disease or condition will improve Outcome: Progressing Goal: Knowledge of secondary prevention will improve Outcome: Progressing Goal: Knowledge of patient specific risk factors addressed and post discharge goals established will improve Outcome: Progressing   Problem: Coping: Goal: Will verbalize positive feelings about self Outcome: Progressing Goal: Will identify appropriate support needs Outcome: Progressing   Problem: Health Behavior/Discharge Planning: Goal: Ability to manage health-related needs will improve Outcome: Progressing   Problem: Self-Care: Goal: Ability to participate in self-care as condition permits will improve Outcome: Progressing Goal: Ability to communicate needs accurately will improve Outcome: Progressing   Problem: Nutrition: Goal: Risk of aspiration will decrease Outcome: Progressing Goal: Dietary intake will improve Outcome: Progressing   Problem: Ischemic Stroke/TIA Tissue Perfusion: Goal: Complications of ischemic stroke/TIA will be minimized Outcome: Progressing   

## 2021-01-29 NOTE — Progress Notes (Signed)
Physical Therapy Treatment Patient Details Name: Sue [name] MRN: [id number] DOB: 06-Feb-1930 Today's Date: 01/29/2021    History of Present Illness 87 yo female presenting to ED on 5/22 with R sided weakness and slurred speech. TPA administered 5/22 at 1314.MRI shows amall acute and subacute infarcts involving bilateral basal ganglia and corona radiata, small to moderate size infarct with petechial hemorrhage involving the right occipitoparietal lobes. PMH includes: CHF, DM II, HLD, and thyroid disease.    PT Comments    Session limited by fatigue. Patient performed seated exercises focusing on B LE strengthening. Patient performed sit to stand x 3 with RW and minA. Patient continues to require step by step instructions due to [facility] and attention deficits. Continue to recommend comprehensive inpatient rehab (CIR) for post-acute therapy needs.     Follow Up Recommendations  Supervision for mobility/OOB;CIR     Equipment Recommendations  3in1 (PT)    Recommendations for Other Services       Precautions / Restrictions Precautions Precautions: Fall Precaution Comments: pt reports 2 falls in past 1 year Restrictions Weight Bearing Restrictions: No    Mobility  Bed Mobility Overal bed mobility: Needs Assistance Bed Mobility: Supine to Sit;Sit to Supine     Supine to sit: Min guard Sit to supine: Min assist;HOB elevated   General bed mobility comments: minA for bringing LEs onto bed, cues for sequencing    Transfers Overall transfer level: Needs assistance Equipment used: Rolling Cornelis Kluver (2 wheeled) Transfers: Sit to/from [employer] Sit to Stand: Min assist Stand pivot transfers: Min assist       General transfer comment: minA for power up into standing and steadying. MinA for RW management and balance during stand pivot transfer with step by step instructions  Ambulation/Gait             General Gait Details: deferred due to  fatigue   Stairs             Wheelchair Mobility    Modified Rankin (Stroke Patients Only) Modified Rankin (Stroke Patients Only) Pre-Morbid Rankin Score: Moderately severe disability Modified Rankin: Moderately severe disability     Balance Overall balance assessment: Needs assistance;History of Falls Sitting-balance support: Feet supported Sitting balance-Leahy Scale: Fair     Standing balance support: Bilateral upper extremity supported;During functional activity Standing balance-Leahy Scale: Poor Standing balance comment: reliant on UE support                            Cognition Arousal/Alertness: Awake/alert Behavior During Therapy: WFL for tasks assessed/performed Overall Cognitive Status: Impaired/Different from baseline Area of Impairment: Following commands;Problem solving;Awareness;Safety/judgement;Memory;Attention                   Current Attention Level: Sustained Memory: Decreased short-term memory Following Commands: Follows one step commands with increased time;Follows multi-step commands inconsistently Safety/Judgement: Decreased awareness of deficits Awareness: Emergent;Intellectual Problem Solving: Slow processing;Requires verbal cues General Comments: STM deficits noted with frequent cueing for attending to task or reminders of task at hand. Difficulty sequencing tasks      Exercises General Exercises - Lower Extremity Long Arc Quad: AROM;Both;10 reps;Seated Hip ABduction/ADduction: AROM;Both;10 reps;Seated (adduction squeeze) Hip Flexion/Marching: AROM;Both;10 reps;Seated Toe Raises: AROM;Both;10 reps;Seated Heel Raises: AROM;Both;10 reps;Seated    General Comments General comments (skin integrity, edema, etc.): on 2L O2 via [place] on arrival with spO2 95%. Patient requested to keep on      Pertinent Vitals/Pain Pain Assessment: No/denies pain  Home Living                      Prior Function             PT Goals (current goals can now be found in the care plan section) Acute Rehab PT Goals Patient Stated Goal: none stated PT Goal Formulation: With patient Time For Goal Achievement: 02/09/21 Potential to Achieve Goals: Fair Progress towards PT goals: Progressing toward goals    Frequency    Min 4X/week      PT Plan Current plan remains appropriate    Co-evaluation              AM-PAC PT "6 Clicks" Mobility   Outcome Measure  Help needed turning from your back to your side while in a flat bed without using bedrails?: A Little Help needed moving from lying on your back to sitting on the side of a flat bed without using bedrails?: A Little Help needed moving to and from a bed to a chair (including a wheelchair)?: A Little Help needed standing up from a chair using your arms (e.g., wheelchair or bedside chair)?: A Little Help needed to walk in hospital room?: A Little Help needed climbing 3-5 steps with a railing? : A Lot 6 Click Score: 17    End of Session Equipment Utilized During Treatment: Gait belt Activity Tolerance: Patient tolerated treatment well Patient left: in bed;with call bell/phone within reach;with bed alarm set Nurse Communication: Mobility status PT Visit Diagnosis: Other abnormalities of gait and mobility (R26.89)     Time: 4562-5638 PT Time Calculation (min) (ACUTE ONLY): 24 min  Charges:  $Therapeutic Exercise: 8-22 mins $Therapeutic Activity: 8-22 mins                     Dynastie Knoop A. Dan Humphreys PT, DPT Acute Rehabilitation Services Pager [phone] Office [phone]    Viviann Spare 01/29/2021, 12:41 PM

## 2021-01-29 NOTE — Progress Notes (Signed)
STROKE TEAM PROGRESS NOTE   INTERVAL HISTORY She is sitting up in the bed and looks comfortable.  No complaints Afebrile. Vital signs stable. Neuro exam unchanged.  Awaiting insurance authorization and bed availability in rehab.  Loop recorder placed yesterday.   Vitals:   01/28/21 2342 01/29/21 0328 01/29/21 0831 01/29/21 1152  BP: 133/71 125/72 137/74 125/75  Pulse: 75 68 68 77  Resp: 16 16 16 16   Temp: 98 F (36.7 C) 97.9 F (36.6 C) 97.7 F (36.5 C) 98 F (36.7 C)  TempSrc: Oral Oral Oral Oral  SpO2: 96% 95% 96% 91%  Weight:      Height:       CBC:  Recent Labs  Lab 01/25/21 1500 01/25/21 1505  WBC 12.0*  --   NEUTROABS 8.2*  --   HGB 16.6* 17.7*  HCT 50.2* 52.0*  MCV 95.6  --   PLT 654*  --    Basic Metabolic Panel:  Recent Labs  Lab 01/25/21 1500 01/25/21 1505 01/26/21 0439  NA 134* 135 134*  K 3.1* 3.3* 3.5  CL 97* 99 98  CO2 23  --  27  GLUCOSE 151* 149* 117*  BUN 12 13 11   CREATININE 1.11* 1.00 1.06*  CALCIUM 9.2  --  8.7*  MG  --   --  1.9  PHOS  --   --  4.0   Lipid Panel:  Recent Labs  Lab 01/26/21 0439  CHOL 182  TRIG 168*  HDL 25*  CHOLHDL 7.3  VLDL 34  LDLCALC *   HgbA1c:  Recent Labs  Lab 01/26/21 0439  HGBA1C 6.9*   IMAGING   CT HEAD CODE STROKE WO CONTRAST Result Date: 01/25/2021  IMPRESSION:  1. No acute infarct identified  2. ASPECTS is 10  3. Atrophy and chronic microvascular ischemia. Chronic infarct right occipital lobe.    CT ANGIO HEAD NECK W WO CM (CODE STROKE) Result Date: 01/25/2021  IMPRESSION:  1. Negative for intracranial large vessel occlusion  2. No significant carotid or vertebral artery stenosis in the neck.   Echo Result Read: 01/27/2021 IMPRESSIONS  1. Left ventricular ejection fraction, by estimation, is 55 to 60%. The  left ventricle has normal function. Left ventricular diastolic parameters  are consistent with Grade I diastolic dysfunction (impaired relaxation).  2. Right ventricular  systolic function is normal. The right ventricular  size is normal. There is normal pulmonary artery systolic pressure. The  estimated right ventricular systolic pressure is 30.7 mmHg.  3. The mitral valve is normal in structure. No evidence of mitral valve  regurgitation. No evidence of mitral stenosis.  4. Aortic valve regurgitation is not visualized. No aortic stenosis is  present.  5. The inferior vena cava is dilated in size with >50% respiratory  variability, suggesting right atrial pressure of 8 mmHg.   EP PPM/ICD IMPLANT  Result Date: 01/28/2021 SURGEON:  01/29/2021, MD   PREPROCEDURE DIAGNOSIS:  Cryptogenic Stroke   POSTPROCEDURE DIAGNOSIS:  Cryptogenic Stroke    PROCEDURES:  1. Implantable loop recorder implantation   INTRODUCTION:  Sue [name] is a 87 y.o. female with a history of unexplained stroke who presents today for implantable loop implantation.  The patient has had a cryptogenic stroke.  Despite an extensive workup by neurology, no reversible causes have been identified.  she has worn telemetry during which she did not have arrhythmias.  There is significant concern for possible atrial fibrillation as the cause for the patients stroke.  The patient  therefore presents today for implantable loop implantation.   DESCRIPTION OF PROCEDURE:  Informed written consent was obtained, and the patient was brought to the electrophysiology lab in a fasting state.  The patient required no sedation for the procedure today.  Mapping over the patient's chest was performed by the EP lab staff to identify the area where electrograms were most prominent for ILR recording.  This area was found to be the left parasternal region over the 3rd-4th intercostal space. The patients left chest was therefore prepped and draped in the usual sterile fashion by the EP lab staff. The skin overlying the left parasternal region was infiltrated with lidocaine for local analgesia.  A 0.5-cm incision was made over  the left parasternal region over the 3rd intercostal space.  A subcutaneous ILR pocket was fashioned using a combination of sharp and blunt dissection.  A Medtronic Reveal Linq model LINQ SN RLBG implantable loop recorder was then placed into the pocket  R waves were very prominent and measured mV. EBL<1 ml.  Steri- Strips and a sterile dressing were then applied.  There were no early apparent complications.   CONCLUSIONS:  1. Successful implantation of a Medtronic Reveal LINQ implantable loop recorder for cryptogenic stroke  2. No early apparent complications.   PHYSICAL EXAM Constitutional: Frail elderly Caucasian lady not in distress appears well-developed and well-nourished.  Psych: Affect appropriate to situation Eyes: No scleral injection HENT: No OP obstrucion Head: Normocephalic.  Cardiovascular: Normal rate and regular rhythm.  Respiratory: breath sounds normal to anterior ascultation GI: Soft.  No distension. There is no tenderness.  Skin: WDI  Neuro: Mental Status: Patient is awake, alert, oriented to person, place, month, year, and situation. No dysarthria.  Follows commands well No signs of aphasia or neglect Cranial Nerves: II: Visual Fields are full. Pupils are equal, round, and reactive to light.   III,IV, VI: EOMI without ptosis or diploplia.   V: Facial sensation is symmetric to temperature VII: Facial movement is symmetric.  VIII: hearing is intact to voice X: Uvula elevates symmetrically XI: Shoulder shrug is symmetric. XII: tongue is midline without atrophy or fasciculations.  Motor: Tone is normal. Bulk is normal. 5/5 strength was present on left upper and left lower. Right arm and leg 4/5 with leg greater than arm weakness.  Diminished fine finger movements on the right and orbits left over right upper extremity. Sensory: Sensation is symmetric to light touch and temperature in the arms and legs. Cerebellar: FNF and HKS are intact  bilaterally  ASSESSMENT/PLAN [name] is a 86 y.o. female with history of  presenting with sudden onset of right-sided weakness. NIH3. Not a candidate for thrombectomy due to poor baseline modified Rankin within time window for IV tPA-given IV tPA.   Suspect right brain subcortical stroke with sudden onset of right sided weakness s/p tPA awaiting further work up.   Code Stroke CTH:  No acute abnormality Atrophy and chronic microvascular ischemia. Chronic infarct right occiptal lobe.  CTA head & neck: No LVO  MRI: Small acute and subacute infarcts involving bilateral basal ganglia and corona radiata. Small to moderate size infarct with petechial hemorrhage involving the right occipitoparietal lobes.  Mild chronic microvascular ischemic changes.  2D Echo: EF 55-60%, grade 1 diastolic dysfunction  LDL 123  HgbA1c 6.9  Loop recorder placed (5/26)  VTE prophylaxis - SCDs, lovenox  Diet: heart healthy  No home AC/AP prior to admission.  DAPT aspirin 81mg  daily and plavix for 3 weeks  then aspirin alone  Stroke prevention plan pending work up  Therapy recommendations: CIR  Disposition:  TBD  Hypertension  Home meds: Coreg 3.125 BID   Stable . BP goal normotensive  Hyperlipidemia  Home meds:  Zocor 20mg    LDL 123, goal < 70  High intensity statin for age group  Continue statin at discharge  Diabetes type II Controlled  Home meds:  None, appears to have been diagnosed 9 mos ago  HgbA1c 6.9, goal < 7.0  CBGs Recent Labs    01/28/21 2110 01/29/21 0607 01/29/21 1150  GLUCAP 122* 120* 113*      SSI  Diabetic coordinator consult  Close PCP follow up  Other Stroke Risk Factors  Advanced Age >/= 60   Former Cigarette smoker, stopped 62 years ago  Obesity, Body mass index is 34.06 kg/m., BMI >/= 30 associated with increased stroke risk, recommend weight loss, diet and exercise as appropriate   Coronary artery disease  Migraines  Other  Active Problems  Hypothyroidism: on synthroid 76 daily  Gerd: protonix 40mg  daily  Depression: resume wellbutrin  Lissy Olivencia-Simmons, ACNP-BC Stroke NP 01/29/2021 I have personally obtained history,examined this patient, reviewed notes, independently viewed imaging studies, participated in medical decision making and plan of care.ROS completed by me personally and pertinent positives fully documented  I have made any additions or clarifications directly to the above note. Agree with note above.  Patient medically stable to be transferred to inpatient rehab when bed available.  , MD Medical Director [facility] Pager: [phone] 01/29/2021 3:10 PM  To contact Stroke Continuity provider, please refer to [phone]. After hours, contact General Neurology

## 2021-01-29 NOTE — Plan of Care (Signed)
  Problem: Education: Goal: Knowledge of disease or condition will improve 01/29/2021 2005 by Melvenia Needles, RN Outcome: Progressing 01/29/2021 1446 by Melvenia Needles, RN Outcome: Progressing Goal: Knowledge of secondary prevention will improve 01/29/2021 2005 by Melvenia Needles, RN Outcome: Progressing 01/29/2021 1446 by Melvenia Needles, RN Outcome: Progressing Goal: Knowledge of patient specific risk factors addressed and post discharge goals established will improve 01/29/2021 2005 by Melvenia Needles, RN Outcome: Progressing 01/29/2021 1446 by Melvenia Needles, RN Outcome: Progressing   Problem: Coping: Goal: Will verbalize positive feelings about self 01/29/2021 2005 by Melvenia Needles, RN Outcome: Progressing 01/29/2021 1446 by Melvenia Needles, RN Outcome: Progressing Goal: Will identify appropriate support needs 01/29/2021 2005 by Melvenia Needles, RN Outcome: Progressing 01/29/2021 1446 by Melvenia Needles, RN Outcome: Progressing   Problem: Ischemic Stroke/TIA Tissue Perfusion: Goal: Complications of ischemic stroke/TIA will be minimized 01/29/2021 2005 by Melvenia Needles, RN Outcome: Progressing 01/29/2021 1446 by Melvenia Needles, RN Outcome: Progressing

## 2021-01-29 NOTE — Progress Notes (Signed)
OT Cancellation Note  Patient Details Name: CHAUNTE HORNBECK MRN: [id number] DOB: December 20, 1929   Cancelled Treatment:    Reason Eval/Treat Not Completed: Other (comment) (Pt on bedpan. Will return as schedule allows.)  Kinlee Garrison M Criss Bartles Latravious Levitt MSOT, OTR/L Acute Rehab Pager: [phone] Office: [phone] 01/29/2021, 4:12 PM

## 2021-01-30 LAB — GLUCOSE, CAPILLARY
Glucose-Capillary: 133 mg/dL — ABNORMAL HIGH (ref 70–99)
Glucose-Capillary: 131 mg/dL — ABNORMAL HIGH (ref 70–99)
Glucose-Capillary: 133 mg/dL — ABNORMAL HIGH (ref 70–99)
Glucose-Capillary: 118 mg/dL — ABNORMAL HIGH (ref 70–99)

## 2021-01-30 MED ORDER — BISACODYL 10 MG RE SUPP
10.0000 mg | Freq: Every day | RECTAL | Status: DC | PRN
  Administered 2021-01-31: 10 mg via RECTAL
  Filled 2021-01-30: qty 1

## 2021-01-30 MED ORDER — SENNOSIDES-DOCUSATE SODIUM 8.6-50 MG PO TABS
1.0000 | ORAL_TABLET | Freq: Every day | ORAL | Status: DC
  Administered 2021-01-30 – 2021-01-31 (×2): 1 via ORAL
  Filled 2021-01-30 (×2): qty 1

## 2021-01-30 MED ORDER — POLYETHYLENE GLYCOL 3350 17 G PO PACK
17.0000 g | PACK | Freq: Every day | ORAL | Status: DC
  Administered 2021-01-30 – 2021-01-31 (×2): 17 g via ORAL
  Filled 2021-01-30 (×2): qty 1

## 2021-01-30 NOTE — Progress Notes (Signed)
Occupational Therapy Treatment Patient Details Name: Sue [name] MRN: [id number] DOB: 01-11-1930 Today's Date: 01/30/2021    History of present illness 87 yo female presenting to ED on 5/22 with R sided weakness and slurred speech. TPA administered 5/22 at 1314.MRI shows amall acute and subacute infarcts involving bilateral basal ganglia and corona radiata, small to moderate size infarct with petechial hemorrhage involving the right occipitoparietal lobes. PMH includes: CHF, DM II, HLD, and thyroid disease.   OT comments  Pt progressing toward OT goals. Pt continues to present with decreased cognition, and safety during ADLs. Pt required Min A during functional mobility using RW in the hallway. Pt required Min A for toilet transfer and min cues to wash hands after toileting. Pt fatigues quickly when performing grooming tasks overhead. Pt perseverated on location of belongings during session and continues to experience STM deficits. Continue to recommend d/c to CIR and will continue to follow acutely to optimize cognition, strength, balance, and independence in ADLs.   Follow Up Recommendations  CIR;Supervision/Assistance - 24 hour    Equipment Recommendations  3 in 1 bedside commode    Recommendations for Other Services PT consult;Rehab consult    Precautions / Restrictions Precautions Precautions: Fall Precaution Comments: pt reports 2 falls in past 1 year Restrictions Weight Bearing Restrictions: No       Mobility Bed Mobility Overal bed mobility: Needs Assistance Bed Mobility: Supine to Sit     Supine to sit: Min guard     General bed mobility comments: min guard for safety and guidance. Pt requiring cues to scoot to EOB.    Transfers Overall transfer level: Needs assistance Equipment used: Rolling walker (2 wheeled) Transfers: Sit to/from Stand Sit to Stand: Min assist         General transfer comment: Pt requires min A to power up into standing from EOB and  toilet. Pt requires min verbal cues for hand placement to push up from surface.    Balance Overall balance assessment: Needs assistance;History of Falls Sitting-balance support: Feet supported Sitting balance-Leahy Scale: Fair Sitting balance - Comments: Able to maintain static sitting. Postural control: Other (comment) (In standing, pt exhibits forward posture with rounded shoulders and R hip cock.) Standing balance support: Bilateral upper extremity supported;During functional activity Standing balance-Leahy Scale: Poor Standing balance comment: Pt reliant on UE support in standing.                           ADL either performed or assessed with clinical judgement   ADL Overall ADL's : Needs assistance/impaired     Grooming: Brushing hair;Minimal assistance;Sitting;Wash/dry hands;Min guard;Cueing for sequencing;Standing Grooming Details (indicate cue type and reason): Pt required min cues to wash hands after toileting. Pt requiring Min Guard during hand hygiene at sink. Pt quickly fatigued when reaching up to take her hair down while sitting EOB. Pt required assistance to put hair back up after taking down.                 Toilet Transfer: Minimal assistance;Grab bars;Ambulation;Regular Teacher, adult education Details (indicate cue type and reason): Min A for safety descent and to power up into standing Toileting- Clothing Manipulation and Hygiene: Min guard;Sitting/lateral lean Toileting - Clothing Manipulation Details (indicate cue type and reason): Pt required min guard for safety     Functional mobility during ADLs: Minimal assistance;Rolling walker General ADL Comments: Pt presents with decreased activity tolerance, balance, and strength requiring min A during functional  mobility and transfers. Pt requires cues for sequencing and safety during ADL tasks.     Vision       Perception     Praxis      Cognition Arousal/Alertness: Awake/alert Behavior  During Therapy: WFL for tasks assessed/performed Overall Cognitive Status: Impaired/Different from baseline Area of Impairment: Memory;Following commands;Awareness;Problem solving;Safety/judgement;Attention                   Current Attention Level: Selective Memory: Decreased short-term memory Following Commands: Follows multi-step commands inconsistently;Follows one step commands with increased time;Follows multi-step commands with increased time Safety/Judgement: Decreased awareness of safety;Decreased awareness of deficits Awareness: Emergent Problem Solving: Slow processing;Requires verbal cues General Comments: Pt somewhat aware of STM memory deficits. Pt able to follow commands with increased time, but difficulty with multistep commands requiring cues to remember to find her room while completing functional ambulation in the hallway. Pt AOx4, but appears to get amount of days she has been in hospital confused and perseverates on the location of her belongings. Pt requires cues to sequence tasks.        Exercises     Shoulder Instructions       General Comments Maintained SpO2 above 90 on RA throughout session during functional mobility and ADLs.    Pertinent Vitals/ Pain       Pain Assessment: Faces Faces Pain Scale: No hurt Pain Intervention(s): Monitored during session  Home Living                                          Prior Functioning/Environment              Frequency  Min 3X/week        Progress Toward Goals  OT Goals(current goals can now be found in the care plan section)  Progress towards OT goals: Progressing toward goals  Acute Rehab OT Goals Patient Stated Goal: none stated OT Goal Formulation: With patient Time For Goal Achievement: 02/09/21 Potential to Achieve Goals: Good ADL Goals Pt Will Perform Grooming: standing;with min guard assist Pt Will Perform Upper Body Dressing: with modified independence;sitting Pt  Will Perform Lower Body Dressing: with min guard assist;sit to/from stand Pt Will Transfer to Toilet: with min guard assist;ambulating;regular height toilet Pt Will Perform Toileting - Clothing Manipulation and hygiene: with min guard assist;sit to/from stand;sitting/lateral leans  Plan Discharge plan remains appropriate    Co-evaluation          OT goals addressed during session: ADL's and self-care;Proper use of Adaptive equipment and DME      AM-PAC OT "6 Clicks" Daily Activity     Outcome Measure   Help from another person eating meals?: A Little Help from another person taking care of personal grooming?: A Little Help from another person toileting, which includes using toliet, bedpan, or urinal?: A Little Help from another person bathing (including washing, rinsing, drying)?: A Lot Help from another person to put on and taking off regular upper body clothing?: A Little Help from another person to put on and taking off regular lower body clothing?: A Lot 6 Click Score: 16    End of Session Equipment Utilized During Treatment: Gait belt;Rolling walker  OT Visit Diagnosis: Unsteadiness on feet (R26.81);Other abnormalities of gait and mobility (R26.89);Muscle weakness (generalized) (M62.81);History of falling (Z91.81)   Activity Tolerance Patient tolerated treatment well   Patient Left in bed;with  call bell/phone within reach;with nursing/sitter in room (Pt sitting EOB with nurse administering medication. Nurse reported feeling comfortable helping pt get back to supine in bed.)   Nurse Communication Mobility status        Time: 1106-1130 OT Time Calculation (min): 24 min  Charges: OT General Charges $OT Visit: 1 Visit OT Treatments $Self Care/Home Management : 23-37 mins  Ladene Artist, OTDS   Ladene Artist 01/30/2021, 2:59 PM

## 2021-01-30 NOTE — Plan of Care (Signed)
  Problem: Education: Goal: Knowledge of disease or condition will improve Outcome: Progressing Goal: Knowledge of secondary prevention will improve Outcome: Progressing Goal: Knowledge of patient specific risk factors addressed and post discharge goals established will improve Outcome: Progressing   Problem: Coping: Goal: Will verbalize positive feelings about self Outcome: Progressing Goal: Will identify appropriate support needs Outcome: Progressing   Problem: Ischemic Stroke/TIA Tissue Perfusion: Goal: Complications of ischemic stroke/TIA will be minimized Outcome: Progressing   

## 2021-01-30 NOTE — H&P (Signed)
Physical Medicine and Rehabilitation Admission H&P    Chief Complaint  Patient presents with  . Functional deficits due to stroke.     HPI: Sue [name] is an 87 year old female with history of HTN, T2DM, depression who was admitted on 01/25/21 with sudden onset of right sided weakness and slurred speech. CTA head was negative for LVO and CT head without acute abnormality. She received IV tPA and follow up MRI brain done revealing small acute and subacute infarcts involving bilateral basal ganglia and corona radiata and small to moderate size infarct with petechial hemorrhage involving right occipitoparietal lobes. 2D echo showed EF 55-60% with grade I DD.   Dr. Pearlean Brownie recommended loop recorder to rule out A fib as cause of embolic stroke and this was placed on 06/25 by Dr. Elberta Fortis. She is to continue DAPT X 3 weeks followed by ASA alone for secondary stroke prevention. She continues to be limited by right sided weakness with balance deficits as well as deficits in memory and higher level cognitive tasks. CIR was recommended due to functional decline.    Review of Systems  Constitutional: Negative for chills and fever.  HENT: Positive for hearing loss.   Eyes: Negative for blurred vision and double vision.  Respiratory: Negative for cough and shortness of breath.   Cardiovascular: Negative for chest pain and palpitations.  Gastrointestinal: Negative for heartburn and nausea.  Genitourinary: Negative for dysuria and urgency.  Musculoskeletal: Positive for joint pain. Negative for back pain and myalgias.  Skin: Negative for rash.  Neurological: Positive for weakness.  Psychiatric/Behavioral: Negative for depression and suicidal ideas.      Past Medical History:  Diagnosis Date  . CHF (congestive heart failure) (HCC)   . Diabetes mellitus without complication (HCC)   . Hyperlipidemia   . Thyroid disease     Past Surgical History:  Procedure Laterality Date  . LOOP RECORDER  INSERTION N/A 01/28/2021   Procedure: LOOP RECORDER INSERTION;  Surgeon: Regan Lemming, MD;  Location: [facility];  Service: Cardiovascular;  Laterality: N/A;    Family History  Problem Relation Age of Onset  . Diabetes Mother     Social History:  Lives with son and independent with [employer] P[employer]. Per reports that she quit smoking about 62 years ago. Her smoking use included cigarettes. She has never used smokeless tobacco. She reports that she does not drink alcohol and does not use drugs.    Allergies: No Known Allergies    Medications Prior to Admission  Medication Sig Dispense Refill  . acetaminophen (TYLENOL) 500 MG tablet Take 1,000 mg by mouth every 6 (six) hours as needed for mild pain.    Marland Kitchen buPROPion (WELLBUTRIN SR) 150 MG 12 hr tablet Take 150 mg by mouth daily.    . carvedilol (COREG) 3.125 MG tablet TAKE TWO TABLETS BY MOUTH TWICE A DAY (Patient taking differently: 3.125 mg 2 (two) times daily with a meal.) 180 tablet 0  . clonazePAM (KLONOPIN) 0.5 MG tablet Take 0.25-0.5 mg by mouth 2 (two) times daily as needed for anxiety.    . furosemide (LASIX) 40 MG tablet Take 40 mg by mouth 2 (two) times daily.    Marland Kitchen levothyroxine (SYNTHROID, LEVOTHROID) 112 MCG tablet Take 112 mcg by mouth daily before breakfast.    . nitrofurantoin, macrocrystal-monohydrate, (MACROBID) 100 MG capsule Take 100 mg by mouth once.    . potassium chloride (K-DUR,KLOR-CON) 10 MEQ tablet Take 10 mEq by mouth 2 (two)  times daily.     . simvastatin (ZOCOR) 20 MG tablet Take 20 mg by mouth daily.      Drug Regimen Review  Drug regimen was reviewed and remains appropriate with no significant issues identified  Home: Home Living Family/patient expects to be discharged to:: Private residence Living Arrangements: Children Available Help at Discharge: Family,Available 24 hours/day Type of Home: House Home Access: Level entry,Stairs to enter Entrance Stairs-Number of Steps: 1 Home Layout: One  level Bathroom Shower/Tub: Engineer, manufacturing systems: Standard Home Equipment: Environmental consultant - 2 wheels,Shower seat,Grab bars - tub/shower,Hand held shower head   Functional History: Prior Function Level of Independence: Independent with assistive device(s) Comments: walked with RW  Functional Status:  Mobility: Bed Mobility Overal bed mobility: Needs Assistance Bed Mobility: Supine to Sit Supine to sit: Min guard Sit to supine: Min guard General bed mobility comments: min guard for safety and guidance. Pt requiring cues to scoot to EOB. Transfers Overall transfer level: Needs assistance Equipment used: Rolling walker (2 wheeled) Transfers: Sit to/from Stand Sit to Stand: Min assist Stand pivot transfers: Min assist General transfer comment: Pt requires min A to power up into standing from EOB and toilet. Pt requires min verbal cues for hand placement to push up from surface. Ambulation/Gait Ambulation/Gait assistance: Min assist Gait Distance (Feet): 10 Feet (x10') Assistive device: Rolling walker (2 wheeled) Gait Pattern/deviations: Step-through pattern,Decreased stride length,Trunk flexed,Drifts right/left General Gait Details: To and from bathroom. Refused further ambulation due to fatigue. Patient required cueing for RW management and assist for RW maneuvering and balance. Gait velocity: decr Gait velocity interpretation: <1.31 ft/sec, indicative of household ambulator    ADL: ADL Overall ADL's : Needs assistance/impaired Eating/Feeding: Set up,Sitting Eating/Feeding Details (indicate cue type and reason): Pt eating breakfast once set up in recliner. No need for assistance to open containers and able to organize plate. Grooming: Brushing hair,Minimal assistance,Cueing for sequencing,Sitting Grooming Details (indicate cue type and reason): Pt quickly fatigued when reaching up to take hair down. Pt required assistance to put hair back up after taking down. Upper Body  Bathing: Set up,Supervision/ safety,Sitting Lower Body Bathing: Moderate assistance,Sit to/from stand Upper Body Dressing : Minimal assistance,Sitting Upper Body Dressing Details (indicate cue type and reason): donned second gown Lower Body Dressing: Maximal assistance,Sit to/from stand Lower Body Dressing Details (indicate cue type and reason): Unable to bend foward to safely don socks. Max A to don socks. Toilet Transfer: Minimal assistance,Grab Estate manager/land agent Details (indicate cue type and reason): Min A for safety descent and to power up into standing Toileting- Clothing Manipulation and Hygiene: Min guard,Sitting/lateral lean Toileting - Clothing Manipulation Details (indicate cue type and reason): Pt required min guard for safety Functional mobility during ADLs: Minimal assistance,Rolling walker General ADL Comments: Pt presents with decreased activity tolerance, balance, and strength requiring min A during functional mobility and transfers. Pt requires cues for sequencing and safety during ADL tasks.  Cognition: Cognition Overall Cognitive Status: Impaired/Different from baseline Arousal/Alertness: Awake/alert Orientation Level: Oriented X4 Attention: Sustained Sustained Attention: Appears intact Memory: Impaired Memory Impairment: Storage deficit,Retrieval deficit,Decreased recall of new information Awareness: Impaired Awareness Impairment: Intellectual impairment,Emergent impairment,Anticipatory impairment Problem Solving: Impaired Problem Solving Impairment: Verbal complex Safety/Judgment: Impaired Cognition Arousal/Alertness: Awake/alert Behavior During Therapy: WFL for tasks assessed/performed Overall Cognitive Status: Impaired/Different from baseline Area of Impairment: Memory,Following commands,Awareness,Problem solving,Safety/judgement,Attention Current Attention Level: Sustained Memory: Decreased short-term memory Following Commands:  Follows multi-step commands inconsistently,Follows one step commands with increased time,Follows multi-step commands with increased time Safety/Judgement:  Decreased awareness of safety,Decreased awareness of deficits Awareness: Intellectual,Emergent Problem Solving: Slow processing,Requires verbal cues General Comments: Pt somewhat aware of STM memory deficits. Pt able to follow commands with increased time, but difficulty with multistep commands requiring cues to remember to find her room while completing functional ambulation in the hallway. Pt AOx4, but appears to get amount of days she has been in hospital confused and perseverates on the location of her belongings. Pt requires cues to sequence tasks.   Blood pressure (P) 136/71, pulse (P) 68, temperature (P) 98.2 F (36.8 C), temperature source (P) Oral, resp. rate (P) 20, height 5\' 4"  (1.626 m), weight 90 kg, SpO2 (P) 95 %. Physical Exam Vitals and nursing note reviewed.  Constitutional:      General: She is not in acute distress.    Appearance: Normal appearance. She is obese.  HENT:     Head: Normocephalic and atraumatic.     Comments: Left pupil irregular post cataract surgery.     Right Ear: External ear normal.     Left Ear: External ear normal.     Nose: Nose normal.  Eyes:     Conjunctiva/sclera: Conjunctivae normal.  Cardiovascular:     Rate and Rhythm: Normal rate and regular rhythm.     Heart sounds: No murmur heard. No gallop.   Pulmonary:     Effort: Pulmonary effort is normal. No respiratory distress.     Breath sounds: Normal breath sounds. No wheezing.  Abdominal:     General: There is no distension.     Palpations: Abdomen is soft. There is no mass.  Musculoskeletal:     Comments: Pain at right 1st MTP with associated swelling  Skin:    General: Skin is warm and dry.     Findings: Bruising present.  Neurological:     Mental Status: She is alert.     Comments: HOH primarily on left. No other CN abnl.  Oriented to self, place, month, reason she's here. RUE 4/5 RLE 4/5 prox to distal with some pain inhibition distally. LUE and LLE 4+/5. Toes up on right. DTR's brisk RLE. Senses pain and light touch in all 4's.      Results for orders placed or performed during the hospital encounter of 01/25/21 (from the past 48 hour(s))  Glucose, capillary     Status: Abnormal   Collection Time: 01/28/21  4:06 PM  Result Value Ref Range   Glucose-Capillary 154 (H) 70 - 99 mg/dL    Comment: Glucose reference range applies only to samples taken after fasting for at least 8 hours.  Glucose, capillary     Status: Abnormal   Collection Time: 01/28/21  9:10 PM  Result Value Ref Range   Glucose-Capillary 122 (H) 70 - 99 mg/dL    Comment: Glucose reference range applies only to samples taken after fasting for at least 8 hours.   Comment 1 Notify RN    Comment 2 Document in Chart   Glucose, capillary     Status: Abnormal   Collection Time: 01/29/21  6:07 AM  Result Value Ref Range   Glucose-Capillary 120 (H) 70 - 99 mg/dL    Comment: Glucose reference range applies only to samples taken after fasting for at least 8 hours.  Glucose, capillary     Status: Abnormal   Collection Time: 01/29/21 11:50 AM  Result Value Ref Range   Glucose-Capillary 113 (H) 70 - 99 mg/dL    Comment: Glucose reference range applies only to samples  taken after fasting for at least 8 hours.  Glucose, capillary     Status: Abnormal   Collection Time: 01/29/21  3:46 PM  Result Value Ref Range   Glucose-Capillary 142 (H) 70 - 99 mg/dL    Comment: Glucose reference range applies only to samples taken after fasting for at least 8 hours.  Glucose, capillary     Status: Abnormal   Collection Time: 01/29/21  9:26 PM  Result Value Ref Range   Glucose-Capillary 150 (H) 70 - 99 mg/dL    Comment: Glucose reference range applies only to samples taken after fasting for at least 8 hours.   Comment 1 Notify RN    Comment 2 Document in Chart    Glucose, capillary     Status: Abnormal   Collection Time: 01/30/21  6:09 AM  Result Value Ref Range   Glucose-Capillary 133 (H) 70 - 99 mg/dL    Comment: Glucose reference range applies only to samples taken after fasting for at least 8 hours.   Comment 1 Notify RN    Comment 2 Document in Chart   Glucose, capillary     Status: Abnormal   Collection Time: 01/30/21 11:31 AM  Result Value Ref Range   Glucose-Capillary 131 (H) 70 - 99 mg/dL    Comment: Glucose reference range applies only to samples taken after fasting for at least 8 hours.   EP PPM/ICD IMPLANT  Result Date: 01/28/2021 SURGEON:  Loman BrooklynWill Camnitz, MD   PREPROCEDURE DIAGNOSIS:  Cryptogenic Stroke   POSTPROCEDURE DIAGNOSIS:  Cryptogenic Stroke    PROCEDURES:  1. Implantable loop recorder implantation   INTRODUCTION:  Eleanora NeighborJocelyn J Loewe is a 87 y.o. female with a history of unexplained stroke who presents today for implantable loop implantation.  The patient has had a cryptogenic stroke.  Despite an extensive workup by neurology, no reversible causes have been identified.  she has worn telemetry during which she did not have arrhythmias.  There is significant concern for possible atrial fibrillation as the cause for the patients stroke.  The patient therefore presents today for implantable loop implantation.   DESCRIPTION OF PROCEDURE:  Informed written consent was obtained, and the patient was brought to the electrophysiology lab in a fasting state.  The patient required no sedation for the procedure today.  Mapping over the patient's chest was performed by the EP lab staff to identify the area where electrograms were most prominent for ILR recording.  This area was found to be the left parasternal region over the 3rd-4th intercostal space. The patients left chest was therefore prepped and draped in the usual sterile fashion by the EP lab staff. The skin overlying the left parasternal region was infiltrated with lidocaine for local analgesia.   A 0.5-cm incision was made over the left parasternal region over the 3rd intercostal space.  A subcutaneous ILR pocket was fashioned using a combination of sharp and blunt dissection.  A Medtronic Reveal Linq model LINQ SN RLBG implantable loop recorder was then placed into the pocket  R waves were very prominent and measured mV. EBL<1 ml.  Steri- Strips and a sterile dressing were then applied.  There were no early apparent complications.   CONCLUSIONS:  1. Successful implantation of a Medtronic Reveal LINQ implantable loop recorder for cryptogenic stroke  2. No early apparent complications.       Medical Problem List and Plan: 1.  Right hemiparesis and functional deficits secondary to bilateral basal ganglia and corona radiata infarct  -patient may  shower  -ELOS/Goals: 12-16 days. Supervision to mod I goals with PT, OT, SLP 2.  Antithrombotics: -DVT/anticoagulation:  Pharmaceutical: Lovenox  -antiplatelet therapy: DAPT X 3 weeks followed by ASA alone.  3. Pain Management: Tylenol prn.   -add voltaren gel for right 1st MTP jt pain  -observe for pain tolerance with WB in therapy 4. Mood: LCSW to follow for evaluation and support.   -chronic depression: wellbutrin  -antipsychotic agents: N/A 5. Neuropsych: This patient is somewhat capable of making decisions on her own behalf. 6. Skin/Wound Care: Routine pressure relief measures.  7. Fluids/Electrolytes/Nutrition: Monitor I/O. Check lytes in am.  8. HTN: Monitor BP tid. Avoid hypoperfusion. Was on Coreg 3.125 mg bid PTA.  9. T2DM: Hgb A1C-6.9. Monitor BS ac/hs and use SSI for elevated BS  --Diet modification.   -on no meds currently 10. Dyslipidemia:Continue Zocor.  11. Polycythemia?: Last CBC showed H/H 16.6/50.2   --Recheck CBC in am.  12. Intermittent hypokalemia: Recheck labs in am.  13.  Hypothyroid: On synthroid 112 mcg for supplement.  14. Constipation: On Miralax-->increase to bid to avoid polypharmacy.         Jacquelynn Cree, PA-C 01/30/2021

## 2021-01-30 NOTE — Progress Notes (Signed)
Physical Therapy Treatment Patient Details Name: Sue [name] MRN: [id number] DOB: 1930-07-31 Today's Date: 01/30/2021    History of Present Illness 88 yo female presenting to ED on 5/22 with R sided weakness and slurred speech. TPA administered 5/22 at 1314.MRI shows amall acute and subacute infarcts involving bilateral basal ganglia and corona radiata, small to moderate size infarct with petechial hemorrhage involving the right occipitoparietal lobes. PMH includes: CHF, DM II, HLD, and thyroid disease.    PT Comments    Patient continues to be limited impaired cognition, primarily STM deficits. Patient requires minA for short ambulation distance for RW management and balance. Patient with 2/4 DOE and requested seated rest break. Patient on 1L O2 [place] initially but removed for mobility and able to maintain >93% throughout. Continue to recommend comprehensive inpatient rehab (CIR) for post-acute therapy needs.    Follow Up Recommendations  Supervision for mobility/OOB;CIR     Equipment Recommendations  3in1 (PT)    Recommendations for Other Services       Precautions / Restrictions Precautions Precautions: Fall Precaution Comments: pt reports 2 falls in past 1 year Restrictions Weight Bearing Restrictions: No    Mobility  Bed Mobility Overal bed mobility: Needs Assistance Bed Mobility: Supine to Sit;Sit to Supine     Supine to sit: Min guard Sit to supine: Min guard   General bed mobility comments: min guard for safety and guidance    Transfers Overall transfer level: Needs assistance Equipment used: Rolling Danyele Smejkal (2 wheeled) Transfers: Sit to/from Stand Sit to Stand: Min assist         General transfer comment: minA for power up into standing from low bed surface and toilet. Cues for hand placement  Ambulation/Gait Ambulation/Gait assistance: Min assist Gait Distance (Feet): 10 Feet (x10') Assistive device: Rolling Kaitlin Ardito (2 wheeled) Gait  Pattern/deviations: Step-through pattern;Decreased stride length;Trunk flexed;Drifts right/left Gait velocity: decr   General Gait Details: To and from bathroom. Refused further ambulation due to fatigue. Patient required cueing for RW management and assist for RW maneuvering and balance.   Stairs             Wheelchair Mobility    Modified Rankin (Stroke Patients Only) Modified Rankin (Stroke Patients Only) Pre-Morbid Rankin Score: Moderately severe disability Modified Rankin: Moderately severe disability     Balance Overall balance assessment: Needs assistance;History of Falls Sitting-balance support: Feet supported Sitting balance-Leahy Scale: Fair     Standing balance support: Bilateral upper extremity supported;During functional activity Standing balance-Leahy Scale: Poor Standing balance comment: reliant on UE support                            Cognition Arousal/Alertness: Awake/alert Behavior During Therapy: WFL for tasks assessed/performed Overall Cognitive Status: Impaired/Different from baseline Area of Impairment: Following commands;Problem solving;Awareness;Safety/judgement;Memory;Attention                   Current Attention Level: Sustained Memory: Decreased short-term memory Following Commands: Follows one step commands with increased time;Follows multi-step commands inconsistently Safety/Judgement: Decreased awareness of deficits;Decreased awareness of safety Awareness: Emergent;Intellectual Problem Solving: Slow processing;Requires verbal cues General Comments: STM deficits with frequent cueing and reminding of task at hand. Difficulty sequencing tasks. Perseverating on need to find clothes and "I moved rooms last night" Difficult to reorient but able to state she was in [facility]      Exercises General Exercises - Lower Extremity Long Arc Quad: AROM;Both;10 reps;Seated Hip Flexion/Marching: AROM;Both;10 reps;Seated  General Comments General comments (skin integrity, edema, etc.): On 1L O2 [place] on arrival with spO2 95%, removed for mobility and maintained >93%      Pertinent Vitals/Pain Pain Assessment: No/denies pain    Home Living                      Prior Function            PT Goals (current goals can now be found in the care plan section) Acute Rehab PT Goals Patient Stated Goal: none stated PT Goal Formulation: With patient Time For Goal Achievement: 02/09/21 Potential to Achieve Goals: Fair Progress towards PT goals: Progressing toward goals    Frequency    Min 4X/week      PT Plan Current plan remains appropriate    Co-evaluation              AM-PAC PT "6 Clicks" Mobility   Outcome Measure  Help needed turning from your back to your side while in a flat bed without using bedrails?: A Little Help needed moving from lying on your back to sitting on the side of a flat bed without using bedrails?: A Little Help needed moving to and from a bed to a chair (including a wheelchair)?: A Little Help needed standing up from a chair using your arms (e.g., wheelchair or bedside chair)?: A Little Help needed to walk in hospital room?: A Little Help needed climbing 3-5 steps with a railing? : A Lot 6 Click Score: 17    End of Session Equipment Utilized During Treatment: Gait belt Activity Tolerance: Patient tolerated treatment well Patient left: in bed;with call bell/phone within reach;with bed alarm set Nurse Communication: Mobility status PT Visit Diagnosis: Other abnormalities of gait and mobility (R26.89)     Time: 0934-1000 PT Time Calculation (min) (ACUTE ONLY): 26 min  Charges:  $Therapeutic Activity: 23-37 mins                     Tyshae Stair A. Dan Humphreys PT, DPT Acute Rehabilitation Services Pager [phone] Office [phone]    Viviann Spare 01/30/2021, 12:48 PM

## 2021-01-30 NOTE — Progress Notes (Signed)
  Speech Language Pathology Treatment: Cognitive-Linquistic  Patient Details Name: Sue [name] MRN: [id number] DOB: 05/21/1930 Today's Date: 01/30/2021 Time: 1937-9024 SLP Time Calculation (min) (ACUTE ONLY): 17 min  Assessment / Plan / Recommendation Clinical Impression  Pt seen for cognitive treatment focused on recall of orientation information and steps to call son using personal cell phone. After education and demonstration in use of room whiteboard and phone as external memory aids to identify current date, pt recalled current date with 100% accuracy (60% prior to instruction). Pt expressed desire to call son, but not being able to recall how to do this. After training in 3 step sequence, pt recalled 2/3 steps to call son across x5 trials given moderate verbal/demonstration cues. Provided written aid with said 3 step sequence for pt to refer to as she needs to assist in future recall. SLP to f/u for continued training in cognitive functions.    HPI HPI: 87 yo female presenting to ED on 5/22 with R sided weakness and slurred speech s/p TPA. MRI revealed small acute and subacute infarcts involving bilateral basal ganglia and and corona radiata as well as small to moderate size infarct with petechial hemorrhage involving the R occipitoparietal lobes. Imaging revealed no acute abnormality. PMH includes: CHF, DM II, HLD< and thyroid disease.      SLP Plan  Continue with current plan of care       Recommendations                   Follow up Recommendations: Inpatient Rehab SLP Visit Diagnosis: Cognitive communication deficit (O97.353) Plan: Continue with current plan of care       GO               Avie Echevaria, MA, CCC-SLP Acute Rehabilitation Services Office Number: [phone]  [name] 01/30/2021, 1:39 PM

## 2021-01-30 NOTE — Progress Notes (Signed)
Inpatient Rehab Admissions Coordinator:    I have a bed available for this pt to admit to CIR on Sunday (5/29) and Dr. Pearlean Brownie in agreement.  Rehab MD (Dr. Riley Kill) to assess pt and confirm admission on Sunday.  Floor RN can call CIR at [phone] for report after 12pm.  I will let pt/family and case manager know.   Estill Dooms, PT, DPT Admissions Coordinator [phone] 01/30/21  1:39 PM

## 2021-01-30 NOTE — Progress Notes (Addendum)
Inpatient Rehab Admissions Coordinator:   Insurance requesting peer to peer prior to making a determination for CIR.  Dr. Riley Kill to complete this AM.  Will continue to follow.   1106: Peer to peer completed and denial overturned.  Pt is now approved for CIR and I have a bed available for her to admit Sunday.  Awaiting final confirmation she is ready.  Pt/family and TOC team aware.   Estill Dooms, PT, DPT Admissions Coordinator [phone] 01/30/21  10:19 AM

## 2021-01-30 NOTE — PMR Pre-admission (Addendum)
PMR Admission Coordinator Pre-Admission Assessment  Patient: Sue [name] is an 87 y.o., female MRN: [id number] DOB: [birth year] Height: $RemoveBeforeDE'5\' 4"'[id number]$  (162.6 cm) Weight: 90 kg              Insurance Information HMO: yes    PPO:      PCP:      IPA:      80/20:      OTHER:  PRIMARY: [employer]      Policy#: [id number]      Subscriber: pt CM Name: Lynelle Smoke      Phone#: [phone]     Fax#: EPIC  Pre-Cert#: [id number] auth for CIR provided by Tammy at [facility].        Employer:  Benefits:  Phone #: [phone]     Name:  Eff. Date: 09/06/2014     Deduct: 0      Out of Pocket Max: $5000 (met $20)      Life Max: n/a  CIR: $225/day for days 1-6      SNF: 20 full days Outpatient:      Co-Pay: $20/visit  Home Health: 80%      Co-Pay: 20% DME: 80%     Co-Pay: 20% Providers:  SECONDARY:       Policy#:       Phone#:   Development worker, community:       Phone#:   The Engineer, petroleum" for patients in Inpatient Rehabilitation Facilities with attached "Privacy Act [place] Records" was provided and verbally reviewed with: Family  Emergency Contact Information Contact Information    Name Relation Home Work [address]   [place], [place] Son   [phone]     Current Medical History  Patient Admitting Diagnosis: 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 CTA head was negative for LVO and CT head without acute abnormality. She received IV tPA and follow up MRI brain done revealing small acute and subacute infarcts involving bilateral basal ganglia and corona radiata and small to moderate size infarct with petechial hemorrhage involving right occipitoparietal lobes. 2D echo showed EF 55-60% with grade I DD.   Dr. Leonie Man recommended loop recorder to rule out A fib as cause  of embolic stroke and this was placed on 06/25 by Dr. Curt Bears. She is to continue DAPT X 3 weeks followed by ASA alone for secondary stroke prevention. She continues to be limited by right sided weakness with balance deficits as well as deficits in memory and higher level cognitive tasks. CIR was recommended due to functional decline.   Complete NIHSS TOTAL: 0 Glasgow Coma Scale Score: 15  Past Medical History  Past Medical History:  Diagnosis Date  . CHF (congestive heart failure) ([place])   . Diabetes mellitus without complication ([place])   . Hyperlipidemia   . Thyroid disease     Family History  family history includes Diabetes in her mother.  Prior Rehab/Hospitalizations:  Has the patient had prior rehab or hospitalizations prior to admission? Yes  Has the patient had major surgery during 100 days prior to admission? Yes  Current Medications   Current Facility-Administered Medications:  .   stroke: mapping our early stages of recovery book, , Does not apply, Once, Beulah Gandy A, NP .  0.9 %  sodium chloride infusion, , Intravenous, Continuous,  August Albino, NP, Stopped at 01/26/21 1835 .  acetaminophen (TYLENOL) tablet 650 mg, 650 mg, Oral, Q4H PRN, 650 mg at 01/25/21 2230 **OR** acetaminophen (TYLENOL) 160 MG/5ML solution 650 mg, 650 mg, Per Tube, Q4H PRN **OR** acetaminophen (TYLENOL) suppository 650 mg, 650 mg, Rectal, Q4H PRN, Beulah Gandy A, NP .  aspirin chewable tablet 81 mg, 81 mg, Oral, Daily, Garvin Fila, MD, 81 mg at 01/30/21 1130 .  bisacodyl (DULCOLAX) suppository 10 mg, 10 mg, Rectal, Daily PRN, Bailey-Modzik, Delila A, NP .  buPROPion (WELLBUTRIN SR) 12 hr tablet 150 mg, 150 mg, Oral, BID, Garvin Fila, MD, 150 mg at 01/30/21 1131 .  Chlorhexidine Gluconate Cloth 2 % PADS 6 each, 6 each, Topical, Daily, Amie Portland, MD, 6 each at 01/30/21 1130 .  clopidogrel (PLAVIX) tablet 75 mg, 75 mg, Oral, Daily, Olivencia-Simmons, Ivelisse, NP, 75 mg at 01/30/21  1130 .  enoxaparin (LOVENOX) injection 40 mg, 40 mg, Subcutaneous, Q24H, Garvin Fila, MD, 40 mg at 01/29/21 2059 .  Gerhardt's butt cream, , Topical, BID, Amie Portland, MD, Given at 01/30/21 1131 .  insulin aspart (novoLOG) injection 0-9 Units, 0-9 Units, Subcutaneous, TID WC, Bailey-Modzik, Delila A, NP, 1 Units at 01/30/21 1139 .  levothyroxine (SYNTHROID) tablet 112 mcg, 112 mcg, Oral, QAC breakfast, Beulah Gandy A, NP, 112 mcg at 01/30/21 0506 .  MEDLINE mouth rinse, 15 mL, Mouth Rinse, BID, Amie Portland, MD, 15 mL at 01/30/21 1131 .  pantoprazole (PROTONIX) EC tablet 40 mg, 40 mg, Oral, QHS, Rozann Lesches, RPH, 40 mg at 01/29/21 2105 .  polyethylene glycol (MIRALAX / GLYCOLAX) packet 17 g, 17 g, Oral, Daily, Bailey-Modzik, Delila A, NP, 17 g at 01/30/21 1137 .  senna-docusate (Senokot-S) tablet 1 tablet, 1 tablet, Oral, QHS, Bailey-Modzik, Delila A, NP .  simvastatin (ZOCOR) tablet 20 mg, 20 mg, Oral, q1800, Olivencia-Simmons, Ivelisse, NP, 20 mg at 01/29/21 1800  Patients Current Diet:  Diet Order            Diet heart healthy/carb modified Room service appropriate? Yes; Fluid consistency: Thin  Diet effective now                 Precautions / Restrictions Precautions Precautions: Fall Precaution Comments: pt reports 2 falls in past 1 year Restrictions Weight Bearing Restrictions: No   Has the patient had 2 or more falls or a fall with injury in the past year?Yes  Prior Activity Level Limited Community (1-2x/wk): walked with a RW prior to admission, wasn't driving, living with son, Virgel Bouquet  Prior Functional Level Prior Function Level of Independence: Independent with assistive device(s) Comments: walked with RW  Self Care: Did the patient need help bathing, dressing, using the toilet or eating?  Independent  Indoor Mobility: Did the patient need assistance with walking from room to room (with or without device)? Independent  Stairs: Did the patient need  assistance with internal or external stairs (with or without device)? Independent  Functional Cognition: Did the patient need help planning regular tasks such as shopping or remembering to take medications? Needed some help  Home Assistive Devices / Equipment Home Equipment: Walker - 2 wheels,Shower seat,Grab bars - tub/shower,Hand held shower head  Prior Device Use: Indicate devices/aids used by the patient prior to current illness, exacerbation or injury? Walker  Current Functional Level Cognition  Arousal/Alertness: Awake/alert Overall Cognitive Status: Impaired/Different from baseline Current Attention Level: Sustained Orientation Level: Oriented X4 Following Commands: Follows multi-step commands inconsistently,Follows one step commands with  increased time,Follows multi-step commands with increased time Safety/Judgement: Decreased awareness of safety,Decreased awareness of deficits General Comments: Pt somewhat aware of STM memory deficits. Pt able to follow commands with increased time, but difficulty with multistep commands requiring cues to remember to find her room while completing functional ambulation in the hallway. Pt AOx4, but appears to get amount of days she has been in hospital confused. Attention: Sustained Sustained Attention: Appears intact Memory: Impaired Memory Impairment: Storage deficit,Retrieval deficit,Decreased recall of new information Awareness: Impaired Awareness Impairment: Intellectual impairment,Emergent impairment,Anticipatory impairment Problem Solving: Impaired Problem Solving Impairment: Verbal complex Safety/Judgment: Impaired    Extremity Assessment (includes Sensation/Coordination)  Upper Extremity Assessment: Generalized weakness  Lower Extremity Assessment: Defer to PT evaluation    ADLs  Overall ADL's : Needs assistance/impaired Eating/Feeding: Set up,Sitting Eating/Feeding Details (indicate cue type and reason): Pt eating breakfast once  set up in recliner. No need for assistance to open containers and able to organize plate. Grooming: Minimal assistance,Cueing for sequencing,Standing Grooming Details (indicate cue type and reason): Min Guard-Min A for safety in standing at sink. Cues for sequencing and use of soap Upper Body Bathing: Set up,Supervision/ safety,Sitting Lower Body Bathing: Moderate assistance,Sit to/from stand Upper Body Dressing : Minimal assistance,Sitting Upper Body Dressing Details (indicate cue type and reason): donned second gown Lower Body Dressing: Maximal assistance,Sit to/from stand Lower Body Dressing Details (indicate cue type and reason): Unable to bend foward to safely don socks. Max A to don socks. Toilet Transfer: Minimal assistance,Ambulation,Regular Toilet,Grab bars Armed forces technical officer Details (indicate cue type and reason): Min A for safety descent and to power up into standing Toileting- Clothing Manipulation and Hygiene: Minimal assistance,Sitting/lateral lean Toileting - Clothing Manipulation Details (indicate cue type and reason): Min A for manage gown. Pt initatially not performing hygiene and requiring Mod cues to initate. Pt presenting with poor awareness and requiring Min A to manage gown for her to reach and perform peri care Functional mobility during ADLs: Minimal assistance,Rolling walker General ADL Comments: Pt performing stand pivot to recliner and then forward mobility forward with RW. Pt presenting with decreased activity tolerance, balance, and strength    Mobility  Overal bed mobility: Needs Assistance Bed Mobility: Supine to Sit,Sit to Supine Supine to sit: Min guard Sit to supine: Min guard General bed mobility comments: min guard for safety and guidance    Transfers  Overall transfer level: Needs assistance Equipment used: Rolling walker (2 wheeled) Transfers: Sit to/from Stand Sit to Stand: Min assist Stand pivot transfers: Min assist General transfer comment: minA  for power up into standing from low bed surface and toilet. Cues for hand placement    Ambulation / Gait / Stairs / Wheelchair Mobility  Ambulation/Gait Ambulation/Gait assistance: Min assist Gait Distance (Feet): 10 Feet (x10') Assistive device: Rolling walker (2 wheeled) Gait Pattern/deviations: Step-through pattern,Decreased stride length,Trunk flexed,Drifts right/left General Gait Details: To and from bathroom. Refused further ambulation due to fatigue. Patient required cueing for RW management and assist for RW maneuvering and balance. Gait velocity: decr Gait velocity interpretation: <1.31 ft/sec, indicative of household ambulator    Posture / Balance Dynamic Sitting Balance Sitting balance - Comments: Able to maintain static sitting. Difficulty maintaining balance to don socks Balance Overall balance assessment: Needs assistance,History of Falls Sitting-balance support: Feet supported Sitting balance-Leahy Scale: Fair Sitting balance - Comments: Able to maintain static sitting. Difficulty maintaining balance to don socks Standing balance support: Bilateral upper extremity supported,During functional activity Standing balance-Leahy Scale: Poor Standing balance comment: reliant on UE support  Special needs/care consideration Oxygen occasionally 1L in hospital, Diabetic management yes and Behavioral consideration has been getting more confused throughout admission     Previous Home Environment (from acute therapy documentation) Living Arrangements: Children Available Help at Discharge: Family,Available 24 hours/day Type of Home: House Home Layout: One level Home Access: Level entry,Stairs to enter Entrance Stairs-Number of Steps: 1 Bathroom Shower/Tub: Chiropodist: Standard  Discharge Living Setting Plans for Discharge Living Setting: Lives with (comment) (son, Virgel Bouquet) Type of Home at Discharge: Apartment Discharge Home Layout: One level Discharge  Home Access: Level entry (curb step to side walk) Discharge Bathroom Shower/Tub: Tub/shower unit Discharge Bathroom Toilet: Standard Discharge Bathroom Accessibility: Yes How Accessible: Accessible via walker Does the patient have any problems obtaining your medications?: No  Social/Family/Support Systems Anticipated Caregiver: son, Janiya Millirons Anticipated Caregiver's Contact Information: [phone] Ability/Limitations of Caregiver: n/a Caregiver Availability: 24/7 Discharge Plan Discussed with Primary Caregiver: Yes Is Caregiver In Agreement with Plan?: Yes Does Caregiver/Family have Issues with Lodging/Transportation while Pt is in Rehab?: No   Goals Patient/Family Goal for Rehab: PT/OT/SLP supervision to mod I Expected length of stay: 12-16 days Additional Information: pt has been getting more confused/restless throughout her admission, ?delirium Pt/Family Agrees to Admission and willing to participate: Yes Program Orientation Provided & Reviewed with Pt/Caregiver Including Roles  & Responsibilities: Yes  Barriers to Discharge: Insurance for SNF coverage   Decrease burden of Care through IP rehab admission: n/a   Possible need for SNF placement upon discharge:Not anticipated   Patient Condition: This patient's medical and functional status has changed since the consult dated: 01/28/2021 in which the Rehabilitation Physician determined and documented that the patient's condition is appropriate for intensive rehabilitative care in an inpatient rehabilitation facility. See "History of Present Illness" (above) for medical update. Functional changes are: pt min assist with mobility and ADLs. Patient's medical and functional status update has been discussed with the Rehabilitation physician and patient remains appropriate for inpatient rehabilitation. Will admit to inpatient rehab Sunday, 02/01/21.  Preadmission Screen Completed By:  Michel Santee, PT, DPT 01/30/2021 1:39  PM ______________________________________________________________________   Discussed status with Dr. Naaman Plummer on 01/30/21 at 1:39 PM  and received approval for admission Sunday 02/01/21.  Admission Coordinator:  Michel Santee, PT, DPT time 1:39 PM Sudie Grumbling 01/30/21

## 2021-01-30 NOTE — Progress Notes (Signed)
STROKE TEAM PROGRESS NOTE   INTERVAL HISTORY No acute events overnight. She is calm, alert and without new concerns.  Afebrile. Vital signs stable. Neuro exam unchanged.  Her insurance company denied rehab stay but was overturned on peer to peer review. She has a bed for Sunday 5/29 per rehab coordinator.    Vitals:   01/29/21 2359 01/30/21 0504 01/30/21 0808 01/30/21 1110  BP: (!) 143/76 139/73 (!) 146/79 (P) 136/71  Pulse: 72 65 72 (P) 68  Resp: 18 20 20  (P) 20  Temp: 99.2 F (37.3 C) 98.1 F (36.7 C) 97.8 F (36.6 C) (P) 98.2 F (36.8 C)  TempSrc: Oral Oral Oral (P) Oral  SpO2: 93% 95% 95% (P) 95%  Weight:      Height:       CBC:  Recent Labs  Lab 01/25/21 1500 01/25/21 1505  WBC 12.0*  --   NEUTROABS 8.2*  --   HGB 16.6* 17.7*  HCT 50.2* 52.0*  MCV 95.6  --   PLT 654*  --    Basic Metabolic Panel:  Recent Labs  Lab 01/25/21 1500 01/25/21 1505 01/26/21 0439  NA 134* 135 134*  K 3.1* 3.3* 3.5  CL 97* 99 98  CO2 23  --  27  GLUCOSE 151* 149* 117*  BUN 12 13 11   CREATININE 1.11* 1.00 1.06*  CALCIUM 9.2  --  8.7*  MG  --   --  1.9  PHOS  --   --  4.0   Lipid Panel:  Recent Labs  Lab 01/26/21 0439  CHOL 182  TRIG 168*  HDL 25*  CHOLHDL 7.3  VLDL 34  LDLCALC *   HgbA1c:  Recent Labs  Lab 01/26/21 0439  HGBA1C 6.9*   IMAGING   CT HEAD CODE STROKE WO CONTRAST Result Date: 01/25/2021  IMPRESSION:  1. No acute infarct identified  2. ASPECTS is 10  3. Atrophy and chronic microvascular ischemia. Chronic infarct right occipital lobe.    CT ANGIO HEAD NECK W WO CM (CODE STROKE) Result Date: 01/25/2021  IMPRESSION:  1. Negative for intracranial large vessel occlusion  2. No significant carotid or vertebral artery stenosis in the neck.   Echo Result Read: 01/27/2021 IMPRESSIONS  1. Left ventricular ejection fraction, by estimation, is 55 to 60%. The  left ventricle has normal function. Left ventricular diastolic parameters  are consistent  with Grade I diastolic dysfunction (impaired relaxation).  2. Right ventricular systolic function is normal. The right ventricular  size is normal. There is normal pulmonary artery systolic pressure. The  estimated right ventricular systolic pressure is 30.7 mmHg.  3. The mitral valve is normal in structure. No evidence of mitral valve  regurgitation. No evidence of mitral stenosis.  4. Aortic valve regurgitation is not visualized. No aortic stenosis is  present.  5. The inferior vena cava is dilated in size with >50% respiratory  variability, suggesting right atrial pressure of 8 mmHg.   No results found. PHYSICAL EXAM Constitutional: Frail elderly Caucasian lady not in distress appears well-developed and well-nourished.  Psych: Affect appropriate to situation Eyes: No scleral injection HENT: No OP obstrucion Head: Normocephalic.  Cardiovascular: Normal rate and regular rhythm.  Respiratory: breath sounds normal to anterior ascultation GI: Soft.  No distension. There is no tenderness.  Skin: WDI  Neuro: Mental Status: Patient is awake, alert, oriented to person, place, month, year, and situation. No dysarthria.  Follows commands well No signs of aphasia or neglect Cranial Nerves: II: Visual Fields  are full. Pupils are equal, round, and reactive to light.   III,IV, VI: EOMI without ptosis or diploplia.   V: Facial sensation is symmetric to temperature VII: Facial movement is symmetric.  VIII: hearing is intact to voice X: Uvula elevates symmetrically XI: Shoulder shrug is symmetric. XII: tongue is midline without atrophy or fasciculations.  Motor: Tone is normal. Bulk is normal. 5/5 strength was present on left upper and left lower. Right arm and leg 4/5 with leg greater than arm weakness.  Diminished fine finger movements on the right and orbits left over right upper extremity. Sensory: Sensation is symmetric to light touch and temperature in the arms and  legs. Cerebellar: FNF and HKS are intact bilaterally  ASSESSMENT/PLAN [name] is a 87 y.o. female with history of  presenting with sudden onset of right-sided weakness. NIH3. Not a candidate for thrombectomy due to poor baseline modified Rankin within time window for IV tPA-given IV tPA.   Suspect right brain subcortical stroke with sudden onset of right sided weakness s/p tPA awaiting further work up.   Code Stroke CTH:  No acute abnormality Atrophy and chronic microvascular ischemia. Chronic infarct right occiptal lobe.  CTA head & neck: No LVO  MRI: Small acute and subacute infarcts involving bilateral basal ganglia and corona radiata. Small to moderate size infarct with petechial hemorrhage involving the right occipitoparietal lobes.  Mild chronic microvascular ischemic changes.  2D Echo: EF 55-60%, grade 1 diastolic dysfunction  LDL 123  HgbA1c 6.9  Loop recorder placed (5/26)  VTE prophylaxis - SCDs, lovenox  Diet: heart healthy  No home AC/AP prior to admission.  DAPT aspirin 81mg  daily and plavix for 3 weeks then aspirin alone  Stroke prevention plan pending work up  Therapy recommendations: CIR  Disposition:  TBD  Hypertension  Home meds: Coreg 3.125 BID   Stable . BP goal normotensive  Hyperlipidemia  Home meds:  Zocor 20mg    LDL 123, goal < 70  High intensity statin for age group  Continue statin at discharge  Diabetes type II Controlled  Home meds:  None, appears to have been diagnosed 9 mos ago  HgbA1c 6.9, goal < 7.0  CBGs Recent Labs    01/29/21 2126 01/30/21 0609 01/30/21 1131  GLUCAP 150* 133* 131*      SSI  Diabetic coordinator consult  Close PCP follow up  Other Stroke Risk Factors  Advanced Age >/= 58   Former Cigarette smoker, stopped 62 years ago  Obesity, Body mass index is 34.06 kg/m., BMI >/= 30 associated with increased stroke risk, recommend weight loss, diet and exercise as appropriate    Coronary artery disease  Migraines  Other Active Problems  Hypothyroidism: on synthroid 02/01/21 daily  Gerd: protonix 40mg  daily  Depression: resume wellbutrin  I have personally obtained history,examined this patient, reviewed notes, independently viewed imaging studies, participated in medical decision making and plan of care.ROS completed by me personally and pertinent positives fully documented  I have made any additions or clarifications directly to the above note. Agree with note above.  Patient is neurologically stable and she has insurance and approval for rehab bed which is not available till Sunday.  Discussed with rehab coordinator and patient and answered questions.  Greater than 50% time during this 25-minute visit was spent in counseling and coordination of care and discussion with care team. , MD Medical Director [facility] Pager: [phone] 01/30/2021 2:11 PM   To contact Stroke Continuity provider,  please refer to [email or web address] After hours, contact General Neurology

## 2021-01-31 LAB — CBC
HCT: 45.1 % (ref 36.0–46.0)
Hemoglobin: 15 g/dL (ref 12.0–15.0)
MCH: 31.9 pg (ref 26.0–34.0)
MCHC: 33.3 g/dL (ref 30.0–36.0)
MCV: 96 fL (ref 80.0–100.0)
Platelets: 467 10*3/uL — ABNORMAL HIGH (ref 150–400)
RBC: 4.7 MIL/uL (ref 3.87–5.11)
RDW: 14.9 % (ref 11.5–15.5)
WBC: 8.7 10*3/uL (ref 4.0–10.5)
nRBC: 0 % (ref 0.0–0.2)

## 2021-01-31 LAB — GLUCOSE, CAPILLARY
Glucose-Capillary: 117 mg/dL — ABNORMAL HIGH (ref 70–99)
Glucose-Capillary: 120 mg/dL — ABNORMAL HIGH (ref 70–99)
Glucose-Capillary: 148 mg/dL — ABNORMAL HIGH (ref 70–99)
Glucose-Capillary: 188 mg/dL — ABNORMAL HIGH (ref 70–99)

## 2021-01-31 LAB — BASIC METABOLIC PANEL
Anion gap: 7 (ref 5–15)
BUN: 7 mg/dL — ABNORMAL LOW (ref 8–23)
CO2: 28 mmol/L (ref 22–32)
Calcium: 9 mg/dL (ref 8.9–10.3)
Chloride: 103 mmol/L (ref 98–111)
Creatinine, Ser: 0.92 mg/dL (ref 0.44–1.00)
GFR, Estimated: 60 mL/min — ABNORMAL LOW (ref >60)
Glucose, Bld: 115 mg/dL — ABNORMAL HIGH (ref 70–99)
Potassium: 4 mmol/L (ref 3.5–5.1)
Sodium: 138 mmol/L (ref 135–145)

## 2021-01-31 MED ORDER — CLONAZEPAM 0.5 MG PO TABS
0.5000 mg | ORAL_TABLET | Freq: Once | ORAL | Status: AC
  Administered 2021-01-31: 0.5 mg via ORAL
  Filled 2021-01-31: qty 1

## 2021-01-31 MED ORDER — SIMVASTATIN 20 MG PO TABS
40.0000 mg | ORAL_TABLET | Freq: Every day | ORAL | Status: DC
  Administered 2021-01-31: 40 mg via ORAL
  Filled 2021-01-31: qty 2

## 2021-01-31 NOTE — Progress Notes (Addendum)
STROKE TEAM PROGRESS NOTE   INTERVAL HISTORY No acute events overnight. Today she thinks she may go to rehab early but I confirmed no change in plan for admission tomorrow. She was advised of this. She reports having a bath this morning and feeling well. No new concerns or complaints.  Afebrile. Vital signs stable. Neuro exam unchanged.  BMP and CBC stable. BG stable.   Her insurance company denied rehab stay but was overturned on peer to peer review. She has a bed for Sunday 5/29 per rehab coordinator.   Vitals:   01/30/21 1943 01/31/21 0015 01/31/21 0437 01/31/21 0721  BP: (!) 157/84 (!) 163/86 132/67 129/81  Pulse: 78 73 73 81  Resp: 17 18 17 18   Temp: 98.1 F (36.7 C) (!) 97.3 F (36.3 C) 97.9 F (36.6 C) 98.1 F (36.7 C)  TempSrc: Oral Oral Oral Oral  SpO2: 92% 95% 95% 92%  Weight:      Height:       CBC:  Recent Labs  Lab 01/25/21 1500 01/25/21 1505  WBC 12.0*  --   NEUTROABS 8.2*  --   HGB 16.6* 17.7*  HCT 50.2* 52.0*  MCV 95.6  --   PLT 654*  --    Basic Metabolic Panel:  Recent Labs  Lab 01/25/21 1500 01/25/21 1505 01/26/21 0439  NA 134* 135 134*  K 3.1* 3.3* 3.5  CL 97* 99 98  CO2 23  --  27  GLUCOSE 151* 149* 117*  BUN 12 13 11   CREATININE 1.11* 1.00 1.06*  CALCIUM 9.2  --  8.7*  MG  --   --  1.9  PHOS  --   --  4.0   Lipid Panel:  Recent Labs  Lab 01/26/21 0439  CHOL 182  TRIG 168*  HDL 25*  CHOLHDL 7.3  VLDL 34  LDLCALC *   HgbA1c:  Recent Labs  Lab 01/26/21 0439  HGBA1C 6.9*   IMAGING   CT HEAD CODE STROKE WO CONTRAST Result Date: 01/25/2021  IMPRESSION:  1. No acute infarct identified  2. ASPECTS is 10  3. Atrophy and chronic microvascular ischemia. Chronic infarct right occipital lobe.    CT ANGIO HEAD NECK W WO CM (CODE STROKE) Result Date: 01/25/2021  IMPRESSION:  1. Negative for intracranial large vessel occlusion  2. No significant carotid or vertebral artery stenosis in the neck.   Echo Result Read:  01/27/2021 IMPRESSIONS  1. Left ventricular ejection fraction, by estimation, is 55 to 60%. The  left ventricle has normal function. Left ventricular diastolic parameters  are consistent with Grade I diastolic dysfunction (impaired relaxation).  2. Right ventricular systolic function is normal. The right ventricular  size is normal. There is normal pulmonary artery systolic pressure. The  estimated right ventricular systolic pressure is 30.7 mmHg.  3. The mitral valve is normal in structure. No evidence of mitral valve  regurgitation. No evidence of mitral stenosis.  4. Aortic valve regurgitation is not visualized. No aortic stenosis is  present.  5. The inferior vena cava is dilated in size with >50% respiratory  variability, suggesting right atrial pressure of 8 mmHg.   No results found. PHYSICAL EXAM Constitutional: Frail elderly Caucasian lady not in distress appears well-developed and well-nourished.  Psych: Affect appropriate to situation Eyes: No scleral injection HENT: No OP obstrucion Head: Normocephalic.  Cardiovascular: Normal rate and regular rhythm.  Respiratory: breath sounds normal to anterior ascultation GI: Soft.  No distension. There is no tenderness.  Skin: WDI  Neuro: Mental Status: Patient is awake, alert, oriented to person, place, month, year, and situation. Speech clear. Follows multistep commands well  Cranial Nerves: II: Visual Fields are full. Pupils are equal, round, and reactive to light.   III,IV, VI: EOMI without ptosis or diploplia.   V: Facial sensation is symmetric to temperature VII: Facial movement is symmetric.  VIII: hearing is intact to voice X: Uvula elevates symmetrically XI: Shoulder shrug is symmetric. XII: tongue is midline without atrophy or fasciculations.  Motor: Tone is normal. Bulk is normal. 5/5 strength was present on left upper and left lower. Right arm and leg 4/5 with leg greater than arm weakness.  Diminished fine  finger movements on the right and orbits left over right upper extremity. Sensory: Sensation is symmetric to light touch and temperature in the arms and legs. Cerebellar: FNF and HKS are intact bilaterally  ASSESSMENT/PLAN Sue [name] is a 87 y.o. female with history of  presenting with sudden onset of right-sided weakness. NIH3. Not a candidate for thrombectomy due to poor baseline modified Rankin within time window for IV tPA-given IV tPA.   Stroke - b/l punctate BG/CR infarct and right occipital small infarct with petechial hemorrhage, embolic pattern, concerning for cardioembolic source  Code Stroke CTH:  No acute abnormality Atrophy and chronic microvascular ischemia. Chronic infarct right occiptal lobe.  CTA head & neck: No LVO  MRI: Small acute and subacute infarcts involving bilateral basal ganglia and corona radiata. Small to moderate size infarct with petechial hemorrhage involving the right occipitoparietal lobes.  Mild chronic microvascular ischemic changes.  2D Echo: EF 55-60%, grade 1 diastolic dysfunction  LDL 123  HgbA1c 6.9  Loop recorder placed (5/26)  VTE prophylaxis - SCDs, lovenox  Diet: heart healthy  No home AC/AP prior to admission.  DAPT aspirin 81mg  daily and plavix for 3 weeks then aspirin alone  Therapy recommendations: CIR  Disposition:  CIR bed to be available 5/29  Hypertension  Home meds: Coreg 3.125 BID   Stable . BP goal normotensive  Hyperlipidemia  Home meds:  Zocor 20mg    LDL 123, goal < 70  High intensity statin for age group  Continue statin at discharge  Diabetes type II Controlled  Home meds:  None, appears to have been diagnosed 9 mos ago  HgbA1c 6.9, goal < 7.0  CBGs  SSI  Diabetic coordinator consult  Close PCP follow up  Other Stroke Risk Factors  Advanced Age >/= 60   Former Cigarette smoker, stopped 62 years ago  Obesity, Body mass index is 34.06 kg/m., BMI >/= 30 associated with  increased stroke risk, recommend weight loss, diet and exercise as appropriate   Coronary artery disease  Migraines  Other Active Problems  Leukocytosis, WBC 12.0->8.7  Polycythemia hemoglobin 6.7->17.7->15.0  AKI creatinine 1.06->0.92  Hypothyroidism: on synthroid daily  Gerd: protonix 40mg  daily  Depression: resume wellbutrin  Sue A Bailey-Modzik, NP-C   ATTENDING NOTE: I reviewed above note and agree with the assessment and plan. Pt was seen and examined.   87 year old female with history of hypertension, hyperlipidemia, CHF admitted for right-sided weakness, slurred speech.  CT no acute abnormality.  CTA head and neck no LVO.  Status post tPA.  MRI showed bilateral BG/CR punctate infarcts as well as right occipital posterior MCA territory small infarct with petechial hemorrhage.  EF 65 to 70%.  LDL 123, A1c 6.9.  Loop recorder placed.  On exam, patient hard of hearing, otherwise recovered well, no focal deficit.  Awake alert, fully orientated, no visual field deficit, no gaze palsy, facial symmetrical, moving bilateral upper and lower extremities symmetrical.  Sensation symmetrical.  No ataxia.  Etiology for patient's stroke concerning for cardioembolic pattern, loop recorder placed.  Continue aspirin 81 and Plavix 75 DAPT for 3 weeks and then aspirin alone.  Increase Zocor from 20 to 40, continue on discharge.  PT/OT recommend CIR, pending CIR placement.  For detailed assessment and plan, please refer to above as I have made changes wherever appropriate.   Marvel Plan, MD PhD Stroke Neurology 01/31/2021 5:08 PM     To contact Stroke Continuity provider, please refer to [email or web address]. After hours, contact General Neurology

## 2021-02-01 ENCOUNTER — Inpatient Hospital Stay (HOSPITAL_COMMUNITY)
Admit: 2021-02-01 | Discharge: 2021-02-23 | DRG: 057 | Disposition: A | Discharge status: Home Health | Payer: HMO | Source: Intra-hospital | Attending: Physical Medicine & Rehabilitation | Admitting: Physical Medicine & Rehabilitation

## 2021-02-01 ENCOUNTER — Encounter (HOSPITAL_COMMUNITY): Payer: Self-pay | Admitting: Physical Medicine & Rehabilitation

## 2021-02-01 ENCOUNTER — Other Ambulatory Visit: Payer: Self-pay

## 2021-02-01 DIAGNOSIS — K1379 Other lesions of oral mucosa: Secondary | ICD-10-CM | POA: Diagnosis not present

## 2021-02-01 DIAGNOSIS — Z6831 Body mass index (BMI) 31.0-31.9, adult: Secondary | ICD-10-CM | POA: Diagnosis not present

## 2021-02-01 DIAGNOSIS — E876 Hypokalemia: Secondary | ICD-10-CM | POA: Diagnosis present

## 2021-02-01 DIAGNOSIS — H44002 Unspecified purulent endophthalmitis, left eye: Secondary | ICD-10-CM | POA: Diagnosis not present

## 2021-02-01 DIAGNOSIS — I6300 Cerebral infarction due to thrombosis of unspecified precerebral artery: Secondary | ICD-10-CM

## 2021-02-01 DIAGNOSIS — I69311 Memory deficit following cerebral infarction: Secondary | ICD-10-CM | POA: Diagnosis not present

## 2021-02-01 DIAGNOSIS — H9190 Unspecified hearing loss, unspecified ear: Secondary | ICD-10-CM | POA: Diagnosis present

## 2021-02-01 DIAGNOSIS — F32A Depression, unspecified: Secondary | ICD-10-CM | POA: Diagnosis present

## 2021-02-01 DIAGNOSIS — E119 Type 2 diabetes mellitus without complications: Secondary | ICD-10-CM | POA: Diagnosis present

## 2021-02-01 DIAGNOSIS — R233 Spontaneous ecchymoses: Secondary | ICD-10-CM | POA: Diagnosis not present

## 2021-02-01 DIAGNOSIS — Z833 Family history of diabetes mellitus: Secondary | ICD-10-CM | POA: Diagnosis not present

## 2021-02-01 DIAGNOSIS — K5900 Constipation, unspecified: Secondary | ICD-10-CM | POA: Diagnosis present

## 2021-02-01 DIAGNOSIS — F0150 Vascular dementia without behavioral disturbance: Secondary | ICD-10-CM | POA: Diagnosis present

## 2021-02-01 DIAGNOSIS — D751 Secondary polycythemia: Secondary | ICD-10-CM | POA: Diagnosis present

## 2021-02-01 DIAGNOSIS — E785 Hyperlipidemia, unspecified: Secondary | ICD-10-CM | POA: Diagnosis present

## 2021-02-01 DIAGNOSIS — B9620 Unspecified Escherichia coli [E. coli] as the cause of diseases classified elsewhere: Secondary | ICD-10-CM | POA: Diagnosis not present

## 2021-02-01 DIAGNOSIS — K5901 Slow transit constipation: Secondary | ICD-10-CM

## 2021-02-01 DIAGNOSIS — N3000 Acute cystitis without hematuria: Secondary | ICD-10-CM | POA: Diagnosis not present

## 2021-02-01 DIAGNOSIS — I110 Hypertensive heart disease with heart failure: Secondary | ICD-10-CM | POA: Diagnosis present

## 2021-02-01 DIAGNOSIS — Z87891 Personal history of nicotine dependence: Secondary | ICD-10-CM

## 2021-02-01 DIAGNOSIS — R339 Retention of urine, unspecified: Secondary | ICD-10-CM

## 2021-02-01 DIAGNOSIS — E669 Obesity, unspecified: Secondary | ICD-10-CM | POA: Diagnosis present

## 2021-02-01 DIAGNOSIS — R410 Disorientation, unspecified: Secondary | ICD-10-CM | POA: Diagnosis present

## 2021-02-01 DIAGNOSIS — I69319 Unspecified symptoms and signs involving cognitive functions following cerebral infarction: Secondary | ICD-10-CM

## 2021-02-01 DIAGNOSIS — E039 Hypothyroidism, unspecified: Secondary | ICD-10-CM | POA: Diagnosis present

## 2021-02-01 DIAGNOSIS — Z79890 Hormone replacement therapy (postmenopausal): Secondary | ICD-10-CM | POA: Diagnosis not present

## 2021-02-01 DIAGNOSIS — I639 Cerebral infarction, unspecified: Secondary | ICD-10-CM | POA: Diagnosis not present

## 2021-02-01 DIAGNOSIS — Z5320 Procedure and treatment not carried out because of patient's decision for unspecified reasons: Secondary | ICD-10-CM | POA: Diagnosis not present

## 2021-02-01 DIAGNOSIS — I63423 Cerebral infarction due to embolism of bilateral anterior cerebral arteries: Secondary | ICD-10-CM | POA: Diagnosis not present

## 2021-02-01 DIAGNOSIS — E1159 Type 2 diabetes mellitus with other circulatory complications: Secondary | ICD-10-CM | POA: Diagnosis present

## 2021-02-01 DIAGNOSIS — R251 Tremor, unspecified: Secondary | ICD-10-CM | POA: Diagnosis present

## 2021-02-01 DIAGNOSIS — I69351 Hemiplegia and hemiparesis following cerebral infarction affecting right dominant side: Principal | ICD-10-CM

## 2021-02-01 DIAGNOSIS — E1169 Type 2 diabetes mellitus with other specified complication: Secondary | ICD-10-CM | POA: Diagnosis present

## 2021-02-01 DIAGNOSIS — N390 Urinary tract infection, site not specified: Secondary | ICD-10-CM

## 2021-02-01 DIAGNOSIS — I5042 Chronic combined systolic (congestive) and diastolic (congestive) heart failure: Secondary | ICD-10-CM | POA: Diagnosis present

## 2021-02-01 DIAGNOSIS — F0151 Vascular dementia with behavioral disturbance: Secondary | ICD-10-CM | POA: Diagnosis not present

## 2021-02-01 DIAGNOSIS — I152 Hypertension secondary to endocrine disorders: Secondary | ICD-10-CM | POA: Diagnosis present

## 2021-02-01 DIAGNOSIS — I5032 Chronic diastolic (congestive) heart failure: Secondary | ICD-10-CM | POA: Diagnosis present

## 2021-02-01 LAB — GLUCOSE, CAPILLARY
Glucose-Capillary: 116 mg/dL — ABNORMAL HIGH (ref 70–99)
Glucose-Capillary: 111 mg/dL — ABNORMAL HIGH (ref 70–99)
Glucose-Capillary: 104 mg/dL — ABNORMAL HIGH (ref 70–99)
Glucose-Capillary: 113 mg/dL — ABNORMAL HIGH (ref 70–99)

## 2021-02-01 MED ORDER — PROCHLORPERAZINE EDISYLATE 10 MG/2ML IJ SOLN: 5.0000 mg | Freq: Four times a day (QID) | INTRAMUSCULAR | Status: DC | PRN

## 2021-02-01 MED ORDER — INSULIN ASPART 100 UNIT/ML IJ SOLN
0.0000 [IU] | Freq: Three times a day (TID) | INTRAMUSCULAR | Status: DC
  Administered 2021-02-03: 1 [IU] via SUBCUTANEOUS

## 2021-02-01 MED ORDER — CARVEDILOL 3.125 MG PO TABS
3.1250 mg | ORAL_TABLET | Freq: Two times a day (BID) | ORAL | Status: DC
  Administered 2021-02-01 – 2021-02-23 (×42): 3.125 mg via ORAL
  Filled 2021-02-01 (×43): qty 1

## 2021-02-01 MED ORDER — ACETAMINOPHEN 325 MG PO TABS
325.0000 mg | ORAL_TABLET | ORAL | Status: DC | PRN
  Administered 2021-02-04: 650 mg via ORAL
  Administered 2021-02-06: 325 mg via ORAL
  Administered 2021-02-06: 650 mg via ORAL
  Filled 2021-02-01: qty 1
  Filled 2021-02-01 (×3): qty 2

## 2021-02-01 MED ORDER — TRAZODONE HCL 50 MG PO TABS: 25.0000 mg | ORAL_TABLET | Freq: Every evening | ORAL | Status: DC | PRN

## 2021-02-01 MED ORDER — SIMVASTATIN 40 MG PO TABS: 40.0000 mg | ORAL_TABLET | Freq: Every day | ORAL | Status: DC

## 2021-02-01 MED ORDER — CLOPIDOGREL BISULFATE 75 MG PO TABS
75.0000 mg | ORAL_TABLET | Freq: Every day | ORAL | Status: AC
  Administered 2021-02-02 – 2021-02-16 (×15): 75 mg via ORAL
  Filled 2021-02-01 (×15): qty 1

## 2021-02-01 MED ORDER — BUPROPION HCL ER (SR) 150 MG PO TB12
150.0000 mg | ORAL_TABLET | Freq: Two times a day (BID) | ORAL | Status: DC
  Administered 2021-02-01 – 2021-02-23 (×43): 150 mg via ORAL
  Filled 2021-02-01 (×45): qty 1

## 2021-02-01 MED ORDER — PANTOPRAZOLE SODIUM 40 MG PO TBEC: 40.0000 mg | DELAYED_RELEASE_TABLET | Freq: Every day | ORAL | Status: DC

## 2021-02-01 MED ORDER — SENNOSIDES-DOCUSATE SODIUM 8.6-50 MG PO TABS
1.0000 | ORAL_TABLET | Freq: Every day | ORAL | Status: DC
  Administered 2021-02-01 – 2021-02-02 (×2): 1 via ORAL
  Filled 2021-02-01 (×2): qty 1

## 2021-02-01 MED ORDER — POTASSIUM CHLORIDE CRYS ER 20 MEQ PO TBCR
20.0000 meq | EXTENDED_RELEASE_TABLET | Freq: Every day | ORAL | Status: DC
  Administered 2021-02-01 – 2021-02-23 (×22): 20 meq via ORAL
  Filled 2021-02-01 (×23): qty 1

## 2021-02-01 MED ORDER — GERHARDT'S BUTT CREAM: 1.0000 "application " | TOPICAL_CREAM | Freq: Two times a day (BID) | CUTANEOUS | Status: DC

## 2021-02-01 MED ORDER — LIVING WELL WITH DIABETES BOOK
Freq: Once | Status: AC
  Filled 2021-02-01: qty 1

## 2021-02-01 MED ORDER — CLOPIDOGREL BISULFATE 75 MG PO TABS: 75.0000 mg | ORAL_TABLET | Freq: Every day | ORAL | Status: DC

## 2021-02-01 MED ORDER — ASPIRIN 81 MG PO CHEW: 81.0000 mg | CHEWABLE_TABLET | Freq: Every day | ORAL | Status: DC

## 2021-02-01 MED ORDER — BLOOD PRESSURE CONTROL BOOK
Freq: Once | Status: AC
  Filled 2021-02-01: qty 1

## 2021-02-01 MED ORDER — SENNOSIDES-DOCUSATE SODIUM 8.6-50 MG PO TABS: 1.0000 | ORAL_TABLET | Freq: Every day | ORAL | Status: DC

## 2021-02-01 MED ORDER — FUROSEMIDE 40 MG PO TABS
40.0000 mg | ORAL_TABLET | Freq: Two times a day (BID) | ORAL | Status: DC
  Administered 2021-02-01 – 2021-02-23 (×42): 40 mg via ORAL
  Filled 2021-02-01 (×43): qty 1

## 2021-02-01 MED ORDER — BISACODYL 10 MG RE SUPP: 10.0000 mg | Freq: Every day | RECTAL | Status: DC | PRN

## 2021-02-01 MED ORDER — POLYETHYLENE GLYCOL 3350 17 G PO PACK: 17.0000 g | PACK | Freq: Every day | ORAL | 0 refills | Status: DC

## 2021-02-01 MED ORDER — POLYETHYLENE GLYCOL 3350 17 G PO PACK: 17.0000 g | PACK | Freq: Every day | ORAL | Status: DC | PRN

## 2021-02-01 MED ORDER — PANTOPRAZOLE SODIUM 40 MG PO TBEC
40.0000 mg | DELAYED_RELEASE_TABLET | Freq: Every day | ORAL | Status: DC
  Administered 2021-02-01 – 2021-02-22 (×22): 40 mg via ORAL
  Filled 2021-02-01 (×22): qty 1

## 2021-02-01 MED ORDER — PROCHLORPERAZINE MALEATE 5 MG PO TABS: 5.0000 mg | ORAL_TABLET | Freq: Four times a day (QID) | ORAL | Status: DC | PRN

## 2021-02-01 MED ORDER — ENOXAPARIN SODIUM 40 MG/0.4ML IJ SOSY
40.0000 mg | PREFILLED_SYRINGE | INTRAMUSCULAR | Status: DC
  Administered 2021-02-01 – 2021-02-22 (×22): 40 mg via SUBCUTANEOUS
  Filled 2021-02-01 (×22): qty 0.4

## 2021-02-01 MED ORDER — ENOXAPARIN SODIUM 40 MG/0.4ML IJ SOSY: 40.0000 mg | PREFILLED_SYRINGE | INTRAMUSCULAR | Status: DC

## 2021-02-01 MED ORDER — FLEET ENEMA 7-19 GM/118ML RE ENEM: 1.0000 | ENEMA | Freq: Once | RECTAL | Status: DC | PRN

## 2021-02-01 MED ORDER — INSULIN ASPART 100 UNIT/ML IJ SOLN: 0.0000 [IU] | Freq: Three times a day (TID) | INTRAMUSCULAR | 11 refills | Status: DC

## 2021-02-01 MED ORDER — ASPIRIN 81 MG PO CHEW
81.0000 mg | CHEWABLE_TABLET | Freq: Every day | ORAL | Status: DC
  Administered 2021-02-02 – 2021-02-23 (×21): 81 mg via ORAL
  Filled 2021-02-01 (×22): qty 1

## 2021-02-01 MED ORDER — BUPROPION HCL ER (SR) 150 MG PO TB12: 150.0000 mg | ORAL_TABLET | Freq: Every day | ORAL | Status: DC

## 2021-02-01 MED ORDER — PROCHLORPERAZINE 25 MG RE SUPP: 12.5000 mg | Freq: Four times a day (QID) | RECTAL | Status: DC | PRN

## 2021-02-01 MED ORDER — DIPHENHYDRAMINE HCL 12.5 MG/5ML PO ELIX
12.5000 mg | ORAL_SOLUTION | Freq: Four times a day (QID) | ORAL | Status: DC | PRN
  Administered 2021-02-08: 25 mg via ORAL
  Filled 2021-02-01: qty 10

## 2021-02-01 MED ORDER — LEVOTHYROXINE SODIUM 112 MCG PO TABS
112.0000 ug | ORAL_TABLET | Freq: Every day | ORAL | Status: DC
  Administered 2021-02-02 – 2021-02-23 (×21): 112 ug via ORAL
  Filled 2021-02-01 (×22): qty 1

## 2021-02-01 MED ORDER — ALUM & MAG HYDROXIDE-SIMETH 200-200-20 MG/5ML PO SUSP
30.0000 mL | ORAL | Status: DC | PRN
  Administered 2021-02-15: 30 mL via ORAL
  Filled 2021-02-01: qty 30

## 2021-02-01 MED ORDER — ORAL CARE MOUTH RINSE: 15.0000 mL | Freq: Two times a day (BID) | OROMUCOSAL | 0 refills | Status: DC

## 2021-02-01 MED ORDER — GERHARDT'S BUTT CREAM
TOPICAL_CREAM | Freq: Two times a day (BID) | CUTANEOUS | Status: DC
  Administered 2021-02-07 – 2021-02-22 (×7): 1 via TOPICAL
  Filled 2021-02-01: qty 1

## 2021-02-01 MED ORDER — SIMVASTATIN 20 MG PO TABS
40.0000 mg | ORAL_TABLET | Freq: Every day | ORAL | Status: DC
  Administered 2021-02-01 – 2021-02-22 (×21): 40 mg via ORAL
  Filled 2021-02-01 (×22): qty 2

## 2021-02-01 MED ORDER — ORAL CARE MOUTH RINSE
15.0000 mL | Freq: Two times a day (BID) | OROMUCOSAL | Status: DC
  Administered 2021-02-02 – 2021-02-23 (×27): 15 mL via OROMUCOSAL

## 2021-02-01 MED ORDER — POLYETHYLENE GLYCOL 3350 17 G PO PACK
17.0000 g | PACK | Freq: Two times a day (BID) | ORAL | Status: DC
  Administered 2021-02-02 – 2021-02-12 (×18): 17 g via ORAL
  Filled 2021-02-01 (×19): qty 1

## 2021-02-01 MED ORDER — GUAIFENESIN-DM 100-10 MG/5ML PO SYRP: 5.0000 mL | ORAL_SOLUTION | Freq: Four times a day (QID) | ORAL | Status: DC | PRN

## 2021-02-01 MED ORDER — DICLOFENAC SODIUM 1 % EX GEL
2.0000 g | Freq: Three times a day (TID) | CUTANEOUS | Status: DC
  Administered 2021-02-02 – 2021-02-23 (×41): 2 g via TOPICAL
  Filled 2021-02-01: qty 100

## 2021-02-01 NOTE — Plan of Care (Signed)
  Problem: Consults Goal: RH STROKE PATIENT EDUCATION Description: See Patient Education module for education specifics  Outcome: Progressing Goal: Diabetes Guidelines if Diabetic/Glucose > 140 Description: If diabetic or lab glucose is > 140 mg/dl - Initiate Diabetes/Hyperglycemia Guidelines & Document Interventions  Outcome: Progressing   Problem: RH BOWEL ELIMINATION Goal: RH STG MANAGE BOWEL WITH ASSISTANCE Description: STG Manage Bowel with mod I Assistance. Outcome: Progressing   Problem: RH BLADDER ELIMINATION Goal: RH STG MANAGE BLADDER WITH ASSISTANCE Description: STG Manage Bladder With mod I Assistance Outcome: Progressing   Problem: RH SKIN INTEGRITY Goal: RH STG MAINTAIN SKIN INTEGRITY WITH ASSISTANCE Description: STG Maintain Skin Integrity With mod I Assistance. Outcome: Progressing   Problem: RH SAFETY Goal: RH STG ADHERE TO SAFETY PRECAUTIONS W/ASSISTANCE/DEVICE Description: STG Adhere to Safety Precautions With cues and reminders. Outcome: Progressing   Problem: RH KNOWLEDGE DEFICIT Goal: RH STG INCREASE KNOWLEDGE OF DIABETES Description: Pt will demonstrate understanding of medication regimen, dietary and lifestyle modifications to better control blood glucose levels with min assist using handouts and booklet provided Outcome: Progressing Goal: RH STG INCREASE KNOWLEDGE OF HYPERTENSION Description: Pt will demonstrate understanding of medication regimen, dietary and lifestyle modifications to better control blood pressure with cues/reminders using handouts and booklet provided. Outcome: Progressing Goal: RH STG INCREASE KNOWLEDGE OF STROKE PROPHYLAXIS Description: Pt will demonstrate understanding of medication regimen, dietary and lifestyle modifications to better control secondary stroke risks/prophylaxis  and prevent stroke using handouts and booklet provided independently Outcome: Progressing   Problem: RH KNOWLEDGE DEFICIT Goal: RH STG INCREASE  KNOWLEGDE OF HYPERLIPIDEMIA Description: Pt will demonstrate understanding of medication regimen, dietary and lifestyle modifications to better control HLD with cues/reminders using handouts and booklet provided. Outcome: Progressing   

## 2021-02-01 NOTE — Discharge Summary (Addendum)
Stroke Discharge Summary  Patient ID: Sue [name]   MRN: [id number]      DOB: [birth year]  Date of Admission: 01/25/2021 Date of Discharge: 02/01/2021  Attending Physician:  Dr. Marvel Plan Consultant(s):   PM&R Patient's PCP:  Sigmund Hazel, MD  Discharge Diagnoses:  1. Bilateral punctate basal ganglia and corona radiata infarct and right occipital small infarct with petechial hemorrhage, embolic pattern, concerning for cardioembolic source  2. HLD 3. HTN 4. DM2 5. Obesity 6. S/p Loop recorder  7/ s/p tPA administration   Active Problems:   Stroke (cerebrum) (HCC)   Acute ischemic stroke (HCC)   Leukocytosis   Dyslipidemia   Diabetes mellitus type 2 in obese (HCC)   Congestive heart failure (HCC)   Medications to be continued on Rehab Allergies as of 02/01/2021   No Known Allergies     Medication List    STOP taking these medications   clonazePAM 0.5 MG tablet Commonly known as: KLONOPIN   potassium chloride 10 MEQ tablet Commonly known as: KLOR-CON     TAKE these medications   acetaminophen 500 MG tablet Commonly known as: TYLENOL Take 1,000 mg by mouth every 6 (six) hours as needed for mild pain.   aspirin 81 MG chewable tablet Chew 1 tablet (81 mg total) by mouth daily.   buPROPion 150 MG 12 hr tablet Commonly known as: WELLBUTRIN SR Take 1 tablet (150 mg total) by mouth daily.   carvedilol 3.125 MG tablet Commonly known as: COREG TAKE TWO TABLETS BY MOUTH TWICE A DAY What changed:   how much to take  how to take this  when to take this   clopidogrel 75 MG tablet Commonly known as: PLAVIX Take 1 tablet (75 mg total) by mouth daily.   enoxaparin 40 MG/0.4ML injection Commonly known as: LOVENOX Inject 0.4 mLs (40 mg total) into the skin daily.   furosemide 40 MG tablet Commonly known as: LASIX Take 40 mg by mouth 2 (two) times daily.   Gerhardt's butt cream Crea Apply 1 application topically 2 (two) times daily.   insulin aspart  100 UNIT/ML injection Commonly known as: novoLOG Inject 0-9 Units into the skin 3 (three) times daily with meals.   levothyroxine 112 MCG tablet Commonly known as: SYNTHROID Take 112 mcg by mouth daily before breakfast.   mouth rinse Liqd solution 15 mLs by Mouth Rinse route 2 (two) times daily.   nitrofurantoin (macrocrystal-monohydrate) 100 MG capsule Commonly known as: MACROBID Take 100 mg by mouth once.   pantoprazole 40 MG tablet Commonly known as: PROTONIX Take 1 tablet (40 mg total) by mouth at bedtime.   polyethylene glycol 17 g packet Commonly known as: MIRALAX / GLYCOLAX Take 17 g by mouth daily.   senna-docusate 8.6-50 MG tablet Commonly known as: Senokot-S Take 1 tablet by mouth at bedtime.   simvastatin 40 MG tablet Commonly known as: ZOCOR Take 1 tablet (40 mg total) by mouth daily at 6 PM. What changed:   medication strength  how much to take  when to take this       LABORATORY STUDIES CBC    Component Value Date/Time   WBC 8.7 01/31/2021 1008   RBC 4.70 01/31/2021 1008   HGB 15.0 01/31/2021 1008   HCT 45.1 01/31/2021 1008   PLT 467 (H) 01/31/2021 1008   MCV 96.0 01/31/2021 1008   MCH 31.9 01/31/2021 1008   MCHC 33.3 01/31/2021 1008   RDW 14.9 01/31/2021 1008  LYMPHSABS 2.1 01/25/2021 1500   MONOABS 1.0 01/25/2021 1500   EOSABS 0.4 01/25/2021 1500   BASOSABS 0.1 01/25/2021 1500   CMP    Component Value Date/Time   NA 138 01/31/2021 1008   K 4.0 01/31/2021 1008   CL 103 01/31/2021 1008   CO2 28 01/31/2021 1008   GLUCOSE 115 (H) 01/31/2021 1008   BUN 7 (L) 01/31/2021 1008   CREATININE 0.92 01/31/2021 1008   CALCIUM 9.0 01/31/2021 1008   PROT 6.3 (L) 01/26/2021 0439   ALBUMIN 3.1 (L) 01/26/2021 0439   AST 37 01/26/2021 0439   ALT 31 01/26/2021 0439   ALKPHOS 72 01/26/2021 0439   BILITOT 0.7 01/26/2021 0439   GFRNONAA 60 (L) 01/31/2021 1008   COAGS Lab Results  Component Value Date   INR 1.1 01/25/2021   INR 1.1 01/09/2021    Lipid Panel    Component Value Date/Time   CHOL 182 01/26/2021 0439   TRIG 168 (H) 01/26/2021 0439   HDL 25 (L) 01/26/2021 0439   CHOLHDL 7.3 01/26/2021 0439   VLDL 34 01/26/2021 0439   LDLCALC 123 (H) 01/26/2021 0439   HgbA1C  Lab Results  Component Value Date   HGBA1C 6.9 (H) 01/26/2021   Urinalysis    Component Value Date/Time   COLORURINE YELLOW 01/26/2021 0800   APPEARANCEUR CLEAR 01/26/2021 0800   LABSPEC 1.018 01/26/2021 0800   PHURINE 6.0 01/26/2021 0800   GLUCOSEU NEGATIVE 01/26/2021 0800   HGBUR NEGATIVE 01/26/2021 0800   BILIRUBINUR NEGATIVE 01/26/2021 0800   KETONESUR NEGATIVE 01/26/2021 0800   PROTEINUR NEGATIVE 01/26/2021 0800   NITRITE NEGATIVE 01/26/2021 0800   LEUKOCYTESUR NEGATIVE 01/26/2021 0800   Urine Drug Screen     Component Value Date/Time   LABOPIA NONE DETECTED 01/09/2021 1410   COCAINSCRNUR NONE DETECTED 01/09/2021 1410   LABBENZ NONE DETECTED 01/09/2021 1410   AMPHETMU NONE DETECTED 01/09/2021 1410   THCU NONE DETECTED 01/09/2021 1410   LABBARB NONE DETECTED 01/09/2021 1410    Alcohol Level    Component Value Date/Time   ETH <10 01/09/2021 1027     SIGNIFICANT DIAGNOSTIC STUDIES CT HEAD WO CONTRAST  Result Date: 01/09/2021 CLINICAL DATA:  Shortness of breath, weakness, focal neurological deficit for more than 6 hours suspected stroke, history CHF, diabetes mellitus EXAM: CT HEAD WITHOUT CONTRAST TECHNIQUE: Contiguous axial images were obtained from the base of the skull through the vertex without intravenous contrast. Sagittal and coronal MPR images reconstructed from axial data set. COMPARISON:  None FINDINGS: Brain: Generalized atrophy. Normal ventricular morphology. No midline shift or mass effect. Otherwise normal appearance of brain parenchyma. No intracranial hemorrhage, mass lesion, or evidence of acute infarction. No extra-axial fluid collections. Vascular: No hyperdense vessels. Minimally prominent basilar tip 5 mm diameter.  Atherosclerotic calcifications of internal carotid arteries at skull base Skull: Demineralized Sinuses/Orbits: Clear Other: N/A IMPRESSION: Atrophy with small vessel chronic ischemic changes of deep cerebral white matter. No acute intracranial abnormalities. Minimally prominent tip of basilar artery measuring 5 mm diameter. Electronically Signed   By: Ulyses Southward M.D.   On: 01/09/2021 12:46   MR BRAIN WO CONTRAST  Result Date: 01/26/2021 CLINICAL DATA:  Code stroke follow-up EXAM: MRI HEAD WITHOUT CONTRAST TECHNIQUE: Multiplanar, multiecho pulse sequences of the brain and surrounding structures were obtained without intravenous contrast. COMPARISON:  Recent CT imaging FINDINGS: Brain: There are areas of acute and subacute infarction involving bilateral basal ganglia and corona radiata and right occipitoparietal lobes. There is evidence of petechial hemorrhage associated  with the right occipitoparietal infarct. Additional patchy foci of T2 hyperintensity in the supratentorial and pontine white matter are nonspecific but probably reflect mild chronic microvascular ischemic changes. Prominence of the ventricles and sulci reflects generalized parenchymal volume loss. There is no intracranial mass or mass effect. There is no hydrocephalus or extra-axial fluid collection. Vascular: Major vessel flow voids at the skull base are preserved. Skull and upper cervical spine: Normal marrow signal is preserved. Sinuses/Orbits: Minor mucosal thickening. No significant orbital abnormality. Other: Sella is unremarkable.  Mastoid air cells are clear. IMPRESSION: Small acute and subacute infarcts involving bilateral basal ganglia and corona radiata. Small to moderate size infarct with petechial hemorrhage involving the right occipitoparietal lobes. Mild chronic microvascular ischemic changes. Electronically Signed   By: Guadlupe Spanish M.D.   On: 01/26/2021 14:27   EP PPM/ICD IMPLANT  Result Date: 01/28/2021 SURGEON:  Loman Brooklyn, MD   PREPROCEDURE DIAGNOSIS:  Cryptogenic Stroke   POSTPROCEDURE DIAGNOSIS:  Cryptogenic Stroke    PROCEDURES:  1. Implantable loop recorder implantation   INTRODUCTION:  WYVONNE CARDA is a 87 y.o. female with a history of unexplained stroke who presents today for implantable loop implantation.  The patient has had a cryptogenic stroke.  Despite an extensive workup by neurology, no reversible causes have been identified.  she has worn telemetry during which she did not have arrhythmias.  There is significant concern for possible atrial fibrillation as the cause for the patients stroke.  The patient therefore presents today for implantable loop implantation.   DESCRIPTION OF PROCEDURE:  Informed written consent was obtained, and the patient was brought to the electrophysiology lab in a fasting state.  The patient required no sedation for the procedure today.  Mapping over the patient's chest was performed by the EP lab staff to identify the area where electrograms were most prominent for ILR recording.  This area was found to be the left parasternal region over the 3rd-4th intercostal space. The patients left chest was therefore prepped and draped in the usual sterile fashion by the EP lab staff. The skin overlying the left parasternal region was infiltrated with lidocaine for local analgesia.  A 0.5-cm incision was made over the left parasternal region over the 3rd intercostal space.  A subcutaneous ILR pocket was fashioned using a combination of sharp and blunt dissection.  A Medtronic Reveal Linq model LINQ SN RLBG implantable loop recorder was then placed into the pocket  R waves were very prominent and measured mV. EBL<1 ml.  Steri- Strips and a sterile dressing were then applied.  There were no early apparent complications.   CONCLUSIONS:  1. Successful implantation of a Medtronic Reveal LINQ implantable loop recorder for cryptogenic stroke  2. No early apparent complications.   DG Chest Portable 1  View  Result Date: 01/25/2021 CLINICAL DATA:  Dyspnea. EXAM: PORTABLE CHEST 1 VIEW COMPARISON:  01/09/2021 and 06/02/2018 FINDINGS: The heart size and mediastinal contours are within normal limits. Pulmonary hyperinflation again no, consistent with COPD. Both lungs are clear. Rounded opacity is again seen in the right cardiophrenic angle. This is stable in appearance, and could be due to diaphragmatic/hiatal hernia, or prominent pericardial fat pad in this location. IMPRESSION: COPD. No active lung disease. Stable rounded opacity in right cardiophrenic angle, which could be due to diaphragmatic/hiatal hernia, or prominent epicardial fat pad. Electronically Signed   By: Danae Orleans M.D.   On: 01/25/2021 16:30   DG Chest Portable 1 View  Result Date: 01/09/2021 CLINICAL  DATA:  Weakness, shortness of breath EXAM: PORTABLE CHEST 1 VIEW COMPARISON:  06/02/2018 FINDINGS: Patchy left lower lobe opacity, suspicious for pneumonia. Mild patchy right lower lobe opacity, atelectasis versus pneumonia. No definite pleural effusions. No frank interstitial edema. No pneumothorax. The heart is normal in size.  Mild thoracic aortic atherosclerosis. IMPRESSION: Patchy left lower lobe opacity, suspicious for pneumonia. Mild patchy right lower lobe opacity, atelectasis versus pneumonia. Electronically Signed   By: Charline BillsSriyesh  Krishnan M.D.   On: 01/09/2021 10:46   ECHOCARDIOGRAM COMPLETE  Result Date: 01/12/2021    ECHOCARDIOGRAM REPORT   Patient Name:   Sue [name] Date of Exam: 01/12/2021 Medical Rec #:  [id number]       Height:       64.0 in Accession #:    [id number]      Weight:       200.0 lb Date of Birth:  06-25-1930       BSA:          1.956 m Patient Age:    87 years        BP:           122/59 mmHg Patient Gender: F               HR:           57 bpm. Exam Location:  Inpatient Procedure: 2D Echo, Cardiac Doppler and Color Doppler Indications:    Dyspnea  History:        Patient has prior history of Echocardiogram  examinations, most                 recent 02/28/2018. CHF, CAD; Risk Factors:Dyslipidemia and                 Diabetes.  Sonographer:    Neomia DearAMARA CROWN RDCS Referring Phys: [id number] MAURICIO DANIEL ARRIEN IMPRESSIONS  1. Left ventricular ejection fraction, by estimation, is 65 to 70%. The left ventricle has normal function. The left ventricle has no regional wall motion abnormalities. Left ventricular diastolic parameters are indeterminate.  2. Right ventricular systolic function is normal. The right ventricular size is normal.  3. Trivial mitral valve regurgitation.  4. Aortic valve regurgitation is not visualized. Mild aortic valve sclerosis is present, with no evidence of aortic valve stenosis.  5. The inferior vena cava is normal in size with greater than 50% respiratory variability, suggesting right atrial pressure of 3 mmHg. Comparison(s): The left ventricular function has improved. FINDINGS  Left Ventricle: Left ventricular ejection fraction, by estimation, is 65 to 70%. The left ventricle has normal function. The left ventricle has no regional wall motion abnormalities. The left ventricular internal cavity size was normal in size. There is  no left ventricular hypertrophy. Left ventricular diastolic parameters are indeterminate. Right Ventricle: The right ventricular size is normal. Right vetricular wall thickness was not assessed. Right ventricular systolic function is normal. Left Atrium: Left atrial size was normal in size. Right Atrium: Right atrial size was normal in size. Pericardium: There is no evidence of pericardial effusion. Mitral Valve: There is mild thickening of the mitral valve leaflet(s). Mild mitral annular calcification. Trivial mitral valve regurgitation. MV peak gradient, 4.1 mmHg. The mean mitral valve gradient is 1.0 mmHg. Tricuspid Valve: The tricuspid valve is normal in structure. Tricuspid valve regurgitation is trivial. Aortic Valve: Aortic valve regurgitation is not visualized. Mild  aortic valve sclerosis is present, with no evidence of aortic valve stenosis. Aortic valve mean gradient measures 6.0 mmHg. Aortic  valve peak gradient measures 10.8 mmHg. Aortic valve area,  by VTI measures 1.51 cm. Pulmonic Valve: The pulmonic valve was normal in structure. Pulmonic valve regurgitation is mild. Aorta: The aortic root and ascending aorta are structurally normal, with no evidence of dilitation. Venous: The inferior vena cava is normal in size with greater than 50% respiratory variability, suggesting right atrial pressure of 3 mmHg. IAS/Shunts: No atrial level shunt detected by color flow Doppler.  LEFT VENTRICLE PLAX 2D LVIDd:         4.50 cm     Diastology LVIDs:         1.70 cm     LV e' medial:    3.89 cm/s LV PW:         0.90 cm     LV E/e' medial:  15.2 LV IVS:        0.90 cm     LV e' lateral:   6.00 cm/s LVOT diam:     1.80 cm     LV E/e' lateral: 9.9 LV SV:         48 LV SV Index:   25 LVOT Area:     2.54 cm  LV Volumes (MOD) LV vol d, MOD A2C: 51.5 ml LV vol d, MOD A4C: 25.0 ml LV vol s, MOD A2C: 17.5 ml LV vol s, MOD A4C: 17.8 ml LV SV MOD A2C:     34.0 ml LV SV MOD A4C:     25.0 ml LV SV MOD BP:      19.1 ml RIGHT VENTRICLE RV S prime:     9.57 cm/s TAPSE (M-mode): 1.9 cm LEFT ATRIUM           Index       RIGHT ATRIUM           Index LA Vol (A2C): 33.0 ml 16.87 ml/m RA Area:     11.10 cm LA Vol (A4C): 20.2 ml 10.33 ml/m RA Volume:   23.90 ml  12.22 ml/m  AORTIC VALVE                    PULMONIC VALVE AV Area (Vmax):    1.52 cm     PV Vmax:       0.86 m/s AV Area (Vmean):   1.55 cm     PV Vmean:      58.600 cm/s AV Area (VTI):     1.51 cm     PV VTI:        0.133 m AV Vmax:           164.00 cm/s  PV Peak grad:  3.0 mmHg AV Vmean:          112.000 cm/s PV Mean grad:  2.0 mmHg AV VTI:            0.319 m AV Peak Grad:      10.8 mmHg AV Mean Grad:      6.0 mmHg LVOT Vmax:         97.90 cm/s LVOT Vmean:        68.200 cm/s LVOT VTI:          0.189 m LVOT/AV VTI ratio: 0.59  AORTA Ao Root  diam: 3.00 cm Ao Asc diam:  3.30 cm MITRAL VALVE MV Area (PHT): 1.92 cm    SHUNTS MV Area VTI:   1.91 cm    Systemic VTI:  0.19 m MV Peak grad:  4.1 mmHg    Systemic  Diam: 1.80 cm MV Mean grad:  1.0 mmHg MV Vmax:       1.01 m/s MV Vmean:      52.9 cm/s MV Decel Time: 396 msec MV E velocity: 59.20 cm/s MV A velocity: 93.60 cm/s MV E/A ratio:  0.63 Dietrich Pates MD Electronically signed by Dietrich Pates MD Signature Date/Time: 01/12/2021/3:59:25 PM    Final    CT HEAD CODE STROKE WO CONTRAST  Result Date: 01/25/2021 CLINICAL DATA:  Code stroke. Acute neuro deficit. Right-sided weakness EXAM: CT HEAD WITHOUT CONTRAST TECHNIQUE: Contiguous axial images were obtained from the base of the skull through the vertex without intravenous contrast. COMPARISON:  CT head 01/09/2021 FINDINGS: Brain: Generalized atrophy. Negative for hydrocephalus. Chronic microvascular ischemic changes in the white matter bilaterally unchanged. Chronic infarct in the right internal capsule unchanged. Hypodensity in the right occipital lobe has become lower density and better define compared to the prior study and is compatible with chronic infarct. Negative for acute cortical infarct, hemorrhage, mass Vascular: Negative for hyperdense vessel Skull: Scattered small lytic lesions in the calvarium bilaterally unchanged from the prior study. These all measure approximately 5 mm in diameter. Sinuses/Orbits: Mild mucosal edema paranasal sinuses. Other: None ASPECTS (Alberta Stroke Program Early CT Score) - Ganglionic level infarction (caudate, lentiform nuclei, internal capsule, insula, M1-M3 cortex): 7 - Supraganglionic infarction (M4-M6 cortex): 3 Total score (0-10 with 10 being normal): 10 IMPRESSION: 1. No acute infarct identified 2. ASPECTS is 10 3. Atrophy and chronic microvascular ischemia. Chronic infarct right occipital lobe. 4. Code stroke imaging results were communicated on 01/25/2021 at 3:20 pm to provider Wilford Corner via text page 5. Multiple small  lytic lesions in the calvarium. While these could be benign, consider myeloma. Electronically Signed   By: Marlan Palau M.D.   On: 01/25/2021 15:22   ECHOCARDIOGRAM LIMITED  Result Date: 01/26/2021    ECHOCARDIOGRAM LIMITED REPORT   Patient Name:   Sue [name] Date of Exam: 01/26/2021 Medical Rec #:  [id number]       Height:       64.0 in Accession #:    [id number]      Weight:       181.7 lb Date of Birth:  Jan 19, 1930       BSA:          1.878 m Patient Age:    87 years        BP:           134/77 mmHg Patient Gender: F               HR:           67 bpm. Exam Location:  Inpatient Procedure: Limited Echo, Cardiac Doppler and Color Doppler Indications:    CVA  History:        Patient has prior history of Echocardiogram examinations, most                 recent 01/12/2021. CHF, Stroke; Risk Factors:Hypertension,                 Dyslipidemia and Diabetes.  Sonographer:    Lavenia Atlas Referring Phys: [phone] DENISE A WOLFE IMPRESSIONS  1. Left ventricular ejection fraction, by estimation, is 55 to 60%. The left ventricle has normal function. Left ventricular diastolic parameters are consistent with Grade I diastolic dysfunction (impaired relaxation).  2. Right ventricular systolic function is normal. The right ventricular size is normal. There is normal pulmonary artery systolic pressure. The estimated  right ventricular systolic pressure is 30.7 mmHg.  3. The mitral valve is normal in structure. No evidence of mitral valve regurgitation. No evidence of mitral stenosis.  4. Aortic valve regurgitation is not visualized. No aortic stenosis is present.  5. The inferior vena cava is dilated in size with >50% respiratory variability, suggesting right atrial pressure of 8 mmHg. FINDINGS  Left Ventricle: Left ventricular ejection fraction, by estimation, is 55 to 60%. The left ventricle has normal function. The left ventricular internal cavity size was normal in size. There is no left ventricular hypertrophy. Left  ventricular diastolic parameters are consistent with Grade I diastolic dysfunction (impaired relaxation). Right Ventricle: The right ventricular size is normal. No increase in right ventricular wall thickness. Right ventricular systolic function is normal. There is normal pulmonary artery systolic pressure. The tricuspid regurgitant velocity is 2.38 m/s, and  with an assumed right atrial pressure of 8 mmHg, the estimated right ventricular systolic pressure is 30.7 mmHg. Left Atrium: Left atrial size was normal in size. Right Atrium: Right atrial size was normal in size. Mitral Valve: The mitral valve is normal in structure. Mild to moderate mitral annular calcification. No evidence of mitral valve stenosis. Tricuspid Valve: The tricuspid valve is normal in structure. Tricuspid valve regurgitation is trivial. Aortic Valve: Aortic valve regurgitation is not visualized. No aortic stenosis is present. Aorta: The aortic root is normal in size and structure. Venous: The inferior vena cava is dilated in size with greater than 50% respiratory variability, suggesting right atrial pressure of 8 mmHg. LEFT VENTRICLE PLAX 2D LVIDd:         4.50 cm  Diastology LVIDs:         3.10 cm  LV e' medial:    3.70 cm/s LV PW:         1.10 cm  LV E/e' medial:  14.2 LV IVS:        1.00 cm  LV e' lateral:   4.79 cm/s LVOT diam:     2.00 cm  LV E/e' lateral: 11.0 LVOT Area:     3.14 cm  RIGHT VENTRICLE RV S prime:     5.98 cm/s LEFT ATRIUM         Index LA diam:    2.90 cm 1.54 cm/m   AORTA Ao Root diam: 2.90 cm MITRAL VALVE               TRICUSPID VALVE MV Area (PHT): 2.76 cm    TR Peak grad:   22.7 mmHg MV Decel Time: 275 msec    TR Vmax:        238.00 cm/s MV E velocity: 52.70 cm/s MV A velocity: 86.50 cm/s  SHUNTS MV E/A ratio:  0.61        Systemic Diam: 2.00 cm Marca Ancona MD Electronically signed by Marca Ancona MD Signature Date/Time: 01/26/2021/5:04:18 PM    Final    CT ANGIO HEAD NECK W WO CM (CODE STROKE)  Result Date:  01/25/2021 CLINICAL DATA:  Acute neuro deficit.  Right-sided weakness. EXAM: CT ANGIOGRAPHY HEAD AND NECK TECHNIQUE: Multidetector CT imaging of the head and neck was performed using the standard protocol during bolus administration of intravenous contrast. Multiplanar CT image reconstructions and MIPs were obtained to evaluate the vascular anatomy. Carotid stenosis measurements (when applicable) are obtained utilizing NASCET criteria, using the distal internal carotid diameter as the denominator. CONTRAST:  50mL OMNIPAQUE IOHEXOL 350 MG/ML SOLN COMPARISON:  CT head 01/25/2021 FINDINGS: CTA NECK FINDINGS Aortic arch:  Mild atherosclerotic disease in the aortic arch. Proximal great vessels widely patent. Right carotid system: Right carotid widely patent without significant stenosis. Left carotid system: Left carotid widely patent without significant stenosis. Vertebral arteries: Both vertebral arteries are widely patent to the basilar without stenosis. Skeleton: Cervical spondylosis.  No acute abnormality. Other neck: Negative for mass or adenopathy in the neck. Upper chest: Lung apices clear bilaterally. Review of the MIP images confirms the above findings CTA HEAD FINDINGS Anterior circulation: Cavernous carotid widely patent bilaterally. Anterior and middle cerebral arteries patent bilaterally without stenosis or large vessel occlusion. Posterior circulation: Both vertebral arteries patent to the basilar. PICA patent bilaterally. Basilar patent with mild atherosclerotic irregularity. Anterior middle cerebral arteries patent bilaterally. Mild stenosis in the right P2 segment. No large vessel occlusion. Venous sinuses: Normal venous enhancement. Anatomic variants: None Review of the MIP images confirms the above findings IMPRESSION: 1. Negative for intracranial large vessel occlusion 2. No significant carotid or vertebral artery stenosis in the neck. Electronically Signed   By: Marlan Palau M.D.   On: 01/25/2021  15:37       HISTORY OF PRESENT ILLNESS Sue [name] is a 87 y.o. female with history of  presenting with sudden onset of right-sided weakness. NIH3. Not a candidate for thrombectomy due to poor baseline modified Rankin within time window for IV tPA-given IV tPA.   HOSPITAL COURSE Sue [name] was admitted to the neurologic ICU for intensive post thrombolytic monitoring and further neurologic work up, evaluation and care. She remained hemodynamically stable with some improvement of her right sided weakness/. Her hospital problem list is presented in a problem based format below.  Stroke - b/l punctate BG/CR infarct and right occipital small infarct with petechial hemorrhage, embolic pattern, concerning for cardioembolic source  Code Stroke CTH:  No acute abnormality Atrophy and chronic microvascular ischemia. Chronic infarct right occiptal lobe.  CTA head & neck: No LVO  MRI: Small acute and subacute infarcts involving bilateral basal ganglia and corona radiata. Small to moderate size infarct with petechial hemorrhage involving the right occipitoparietal lobes.  Mild chronic microvascular ischemic changes.  2D Echo: EF 55-60%, grade 1 diastolic dysfunction  LDL 123  HgbA1c 6.9  Loop recorder placed (5/26)  VTE prophylaxis - SCDs, lovenox  Diet: heart healthy  No home AC/AP prior to admission.  DAPT aspirin  daily and plavix for 3 weeks then aspirin alone  Therapy recommendations: CIR  Disposition:  CIR bed to be available 5/29  Hypertension  Home meds: Coreg 3.125 BID   Stable  BP goal normotensive  Hyperlipidemia  Home meds:  Zocor , now increased to Zocor 40 mg   LDL 123, goal < 70  Continue statin at discharge  Diabetes type II Controlled  Home meds:  None, appears to have been diagnosed 9 mos ago  HgbA1c 6.9, goal < 7.0  CBGs  SSI  Diabetic coordinator consult  Close PCP follow up  Other Stroke Risk Factors  Advanced Age  >/= 28   Former Cigarette smoker, stopped 62 years ago  Obesity, Body mass index is 34.06 kg/m., BMI >/= 30 associated with increased stroke risk, recommend weight loss, diet and exercise as appropriate   Coronary artery disease  Migraines  Other Active Problems  Leukocytosis, WBC 12.0->8.7  Polycythemia hemoglobin 6.7->17.7->15.0  AKI creatinine 1.06->0.92  Hypothyroidism: on synthroid daily  Gerd: protonix  daily  Depression: resume wellbutrin  DISCHARGE EXAM Blood pressure 136/83, pulse 67, temperature 98.1 F (36.7  C), temperature source Oral, resp. rate 16, height 5\' 4"  (1.626 m), weight 90 kg, SpO2 93 %. PHYSICAL EXAM Constitutional: Frail elderly Caucasian lady not in distress appears well-developed and well-nourished.  Psych: Affect appropriate to situation Eyes: No scleral injection HENT: No OP obstrucion Head: Normocephalic.  Cardiovascular: Normal rate and regular rhythm.  Respiratory: breath sounds normal to anterior ascultation GI: Soft. No distension. There is no tenderness.  Skin: WDI  Neuro: Mental Status: Patient is awake, alert, oriented to person, place, month, year, and situation. Speech clear. Follows multistep commands well  Cranial Nerves: II: Visual Fields are full. Pupils are equal, round, and reactive to light.  III,IV, VI: EOMI without ptosis or diploplia.   V: Facial sensation is symmetric to temperature VII: Facial movement is symmetric.  VIII: hearing is intact to voice X: Uvula elevates symmetrically XI: Shoulder shrug is symmetric. XII: tongue is midline without atrophy or fasciculations.  Motor: Tone is normal. Bulk is normal. 5/5 strength was presenton left upper and left lower. Right arm and leg 4/5 with leg greater than arm weakness.  Diminished fine finger movements on the right and orbits left over right upper extremity. Sensory: Sensation is symmetric to light touch and temperature in the arms and  legs. Cerebellar: FNF and HKS are intact bilaterally  Discharge Diet      Diet   Diet heart healthy/carb modified Room service appropriate? Yes; Fluid consistency: Thin   liquids  DISCHARGE PLAN  Disposition:  Transfer to [facility] for ongoing PT, OT and ST  Continue aspirin 81 and Plavix 75 DAPT for 3 weeks and then aspirin alone  Recommend ongoing stroke risk factor control by Primary Care Physician at time of discharge from inpatient rehabilitation.  Follow-up PCP Sigmund Hazel, MD in 2 weeks following discharge from rehab.  Follow-up in [facility] in 4 weeks following discharge from rehab, office to schedule an appointment.   35 minutes were spent preparing discharge. Delila A Bailey-Modzik, NP-C  ATTENDING NOTE: I reviewed above note and agree with the assessment and plan. Pt was seen and examined.   No acute event overnight.  Patient neuro stable.  On DAPT and statin.  Has loop recorder placed last week.  Continue monitoring with cardiology.  Patient ready for CIR discharge.  For detailed assessment and plan, please refer to above as I have made changes wherever appropriate.   Marvel Plan, MD PhD Stroke Neurology 02/01/2021 4:59 PM

## 2021-02-01 NOTE — Progress Notes (Signed)
Report called to Rehab RN; will transfer when the room is ready; in the process of being cleaned.

## 2021-02-01 NOTE — Progress Notes (Signed)
Located loop recorder equipment in patient's belongings; informed Rehab RN of equip and loop recorder site without dressing; site unremarkable.

## 2021-02-01 NOTE — H&P (Signed)
Physical Medicine and Rehabilitation Admission H&P        Chief Complaint  Patient presents with  . Functional deficits due to stroke.       HPI: Sue [name] is an 87 year old female with history of HTN, T2DM, depression who was admitted on 01/25/21 with sudden onset of right sided weakness and slurred speech. CTA head was negative for LVO and CT head without acute abnormality. She received IV tPA and follow up MRI brain done revealing small acute and subacute infarcts involving bilateral basal ganglia and corona radiata and small to moderate size infarct with petechial hemorrhage involving right occipitoparietal lobes. 2D echo showed EF 55-60% with grade I DD.   Dr. Pearlean Brownie recommended loop recorder to rule out A fib as cause of embolic stroke and this was placed on 06/25 by Dr. Elberta Fortis. She is to continue DAPT X 3 weeks followed by ASA alone for secondary stroke prevention. She continues to be limited by right sided weakness with balance deficits as well as deficits in memory and higher level cognitive tasks. CIR was recommended due to functional decline.      Review of Systems  Constitutional: Negative for chills and fever.  HENT: Positive for hearing loss.   Eyes: Negative for blurred vision and double vision.  Respiratory: Negative for cough and shortness of breath.   Cardiovascular: Negative for chest pain and palpitations.  Gastrointestinal: Negative for heartburn and nausea.  Genitourinary: Negative for dysuria and urgency.  Musculoskeletal: Positive for joint pain. Negative for back pain and myalgias.  Skin: Negative for rash.  Neurological: Positive for weakness.  Psychiatric/Behavioral: Negative for depression and suicidal ideas.            Past Medical History:  Diagnosis Date  . CHF (congestive heart failure) (HCC)    . Diabetes mellitus without complication (HCC)    . Hyperlipidemia    . Thyroid disease             Past Surgical History:  Procedure  Laterality Date  . LOOP RECORDER INSERTION N/A 01/28/2021    Procedure: LOOP RECORDER INSERTION;  Surgeon: Regan Lemming, MD;  Location: [facility] INVASIVE CV [facility];  Service: Cardiovascular;  Laterality: N/A;           Family History  Problem Relation Age of Onset  . Diabetes Mother        Social History:  Lives with son and independent with Walker PTA. Per reports that she quit smoking about 62 years ago. Her smoking use included cigarettes. She has never used smokeless tobacco. She reports that she does not drink alcohol and does not use drugs.      Allergies: No Known Allergies            Medications Prior to Admission  Medication Sig Dispense Refill  . acetaminophen (TYLENOL) 500 MG tablet Take 1,000 mg by mouth every 6 (six) hours as needed for mild pain.      Marland Kitchen buPROPion (WELLBUTRIN SR) 150 MG 12 hr tablet Take 150 mg by mouth daily.      . carvedilol (COREG) 3.125 MG tablet TAKE TWO TABLETS BY MOUTH TWICE A DAY (Patient taking differently: 3.125 mg 2 (two) times daily with a meal.) 180 tablet 0  . clonazePAM (KLONOPIN) 0.5 MG tablet Take 0.25-0.5 mg by mouth 2 (two) times daily as needed for anxiety.      . furosemide (LASIX) 40 MG tablet Take 40 mg by mouth 2 (  two) times daily.      Marland Kitchen levothyroxine (SYNTHROID, LEVOTHROID) 112 MCG tablet Take 112 mcg by mouth daily before breakfast.      . nitrofurantoin, macrocrystal-monohydrate, (MACROBID) 100 MG capsule Take 100 mg by mouth once.      . potassium chloride (K-DUR,KLOR-CON) 10 MEQ tablet Take 10 mEq by mouth 2 (two) times daily.       . simvastatin (ZOCOR) 20 MG tablet Take 20 mg by mouth daily.          Drug Regimen Review  Drug regimen was reviewed and remains appropriate with no significant issues identified   Home: Home Living Family/patient expects to be discharged to:: Private residence Living Arrangements: Children Available Help at Discharge: Family,Available 24 hours/day Type of Home: House Home Access: Level  entry,Stairs to enter Entrance Stairs-Number of Steps: 1 Home Layout: One level Bathroom Shower/Tub: Engineer, manufacturing systems: Standard Home Equipment: Environmental consultant - 2 wheels,Shower seat,Grab bars - tub/shower,Hand held shower head   Functional History: Prior Function Level of Independence: Independent with assistive device(s) Comments: walked with RW   Functional Status:  Mobility: Bed Mobility Overal bed mobility: Needs Assistance Bed Mobility: Supine to Sit Supine to sit: Min guard Sit to supine: Min guard General bed mobility comments: min guard for safety and guidance. Pt requiring cues to scoot to EOB. Transfers Overall transfer level: Needs assistance Equipment used: Rolling walker (2 wheeled) Transfers: Sit to/from Stand Sit to Stand: Min assist Stand pivot transfers: Min assist General transfer comment: Pt requires min A to power up into standing from EOB and toilet. Pt requires min verbal cues for hand placement to push up from surface. Ambulation/Gait Ambulation/Gait assistance: Min assist Gait Distance (Feet): 10 Feet (x10') Assistive device: Rolling walker (2 wheeled) Gait Pattern/deviations: Step-through pattern,Decreased stride length,Trunk flexed,Drifts right/left General Gait Details: To and from bathroom. Refused further ambulation due to fatigue. Patient required cueing for RW management and assist for RW maneuvering and balance. Gait velocity: decr Gait velocity interpretation: <1.31 ft/sec, indicative of household ambulator   ADL: ADL Overall ADL's : Needs assistance/impaired Eating/Feeding: Set up,Sitting Eating/Feeding Details (indicate cue type and reason): Pt eating breakfast once set up in recliner. No need for assistance to open containers and able to organize plate. Grooming: Brushing hair,Minimal assistance,Cueing for sequencing,Sitting Grooming Details (indicate cue type and reason): Pt quickly fatigued when reaching up to take hair down. Pt  required assistance to put hair back up after taking down. Upper Body Bathing: Set up,Supervision/ safety,Sitting Lower Body Bathing: Moderate assistance,Sit to/from stand Upper Body Dressing : Minimal assistance,Sitting Upper Body Dressing Details (indicate cue type and reason): donned second gown Lower Body Dressing: Maximal assistance,Sit to/from stand Lower Body Dressing Details (indicate cue type and reason): Unable to bend foward to safely don socks. Max A to don socks. Toilet Transfer: Minimal assistance,Grab Estate manager/land agent Details (indicate cue type and reason): Min A for safety descent and to power up into standing Toileting- Clothing Manipulation and Hygiene: Min guard,Sitting/lateral lean Toileting - Clothing Manipulation Details (indicate cue type and reason): Pt required min guard for safety Functional mobility during ADLs: Minimal assistance,Rolling walker General ADL Comments: Pt presents with decreased activity tolerance, balance, and strength requiring min A during functional mobility and transfers. Pt requires cues for sequencing and safety during ADL tasks.   Cognition: Cognition Overall Cognitive Status: Impaired/Different from baseline Arousal/Alertness: Awake/alert Orientation Level: Oriented X4 Attention: Sustained Sustained Attention: Appears intact Memory: Impaired Memory Impairment: Storage deficit,Retrieval deficit,Decreased recall of new  information Awareness: Impaired Awareness Impairment: Intellectual impairment,Emergent impairment,Anticipatory impairment Problem Solving: Impaired Problem Solving Impairment: Verbal complex Safety/Judgment: Impaired Cognition Arousal/Alertness: Awake/alert Behavior During Therapy: WFL for tasks assessed/performed Overall Cognitive Status: Impaired/Different from baseline Area of Impairment: Memory,Following commands,Awareness,Problem solving,Safety/judgement,Attention Current Attention  Level: Sustained Memory: Decreased short-term memory Following Commands: Follows multi-step commands inconsistently,Follows one step commands with increased time,Follows multi-step commands with increased time Safety/Judgement: Decreased awareness of safety,Decreased awareness of deficits Awareness: Intellectual,Emergent Problem Solving: Slow processing,Requires verbal cues General Comments: Pt somewhat aware of STM memory deficits. Pt able to follow commands with increased time, but difficulty with multistep commands requiring cues to remember to find her room while completing functional ambulation in the hallway. Pt AOx4, but appears to get amount of days she has been in hospital confused and perseverates on the location of her belongings. Pt requires cues to sequence tasks.     Blood pressure (P) 136/71, pulse (P) 68, temperature (P) 98.2 F (36.8 C), temperature source (P) Oral, resp. rate (P) 20, height 5\' 4"  (1.626 m), weight 90 kg, SpO2 (P) 95 %. Physical Exam Vitals and nursing note reviewed.  Constitutional:      General: She is not in acute distress.    Appearance: Normal appearance. She is obese.  HENT:     Head: Normocephalic and atraumatic.     Comments: Left pupil irregular post cataract surgery.     Right Ear: External ear normal.     Left Ear: External ear normal.     Nose: Nose normal.  Eyes:     Conjunctiva/sclera: Conjunctivae normal.  Cardiovascular:     Rate and Rhythm: Normal rate and regular rhythm.     Heart sounds: No murmur heard. No gallop.   Pulmonary:     Effort: Pulmonary effort is normal. No respiratory distress.     Breath sounds: Normal breath sounds. No wheezing.  Abdominal:     General: There is no distension.     Palpations: Abdomen is soft. There is no mass.  Musculoskeletal:     Comments: Pain at right 1st MTP with associated swelling  Skin:    General: Skin is warm and dry.     Findings: Bruising present.  Neurological:     Mental Status:  She is alert.     Comments: HOH primarily on left. No other CN abnl. Oriented to self, place, month, reason she's here. RUE 4/5 RLE 4/5 prox to distal with some pain inhibition distally. LUE and LLE 4+/5. Toes up on right. DTR's brisk RLE. Senses pain and light touch in all 4's.        Lab Results Last 48 Hours        Results for orders placed or performed during the hospital encounter of 01/25/21 (from the past 48 hour(s))  Glucose, capillary     Status: Abnormal    Collection Time: 01/28/21  4:06 PM  Result Value Ref Range    Glucose-Capillary 154 (H) 70 - 99 mg/dL      Comment: Glucose reference range applies only to samples taken after fasting for at least 8 hours.  Glucose, capillary     Status: Abnormal    Collection Time: 01/28/21  9:10 PM  Result Value Ref Range    Glucose-Capillary 122 (H) 70 - 99 mg/dL      Comment: Glucose reference range applies only to samples taken after fasting for at least 8 hours.    Comment 1 Notify RN      Comment 2  Document in Chart    Glucose, capillary     Status: Abnormal    Collection Time: 01/29/21  6:07 AM  Result Value Ref Range    Glucose-Capillary 120 (H) 70 - 99 mg/dL      Comment: Glucose reference range applies only to samples taken after fasting for at least 8 hours.  Glucose, capillary     Status: Abnormal    Collection Time: 01/29/21 11:50 AM  Result Value Ref Range    Glucose-Capillary 113 (H) 70 - 99 mg/dL      Comment: Glucose reference range applies only to samples taken after fasting for at least 8 hours.  Glucose, capillary     Status: Abnormal    Collection Time: 01/29/21  3:46 PM  Result Value Ref Range    Glucose-Capillary 142 (H) 70 - 99 mg/dL      Comment: Glucose reference range applies only to samples taken after fasting for at least 8 hours.  Glucose, capillary     Status: Abnormal    Collection Time: 01/29/21  9:26 PM  Result Value Ref Range    Glucose-Capillary 150 (H) 70 - 99 mg/dL      Comment: Glucose  reference range applies only to samples taken after fasting for at least 8 hours.    Comment 1 Notify RN      Comment 2 Document in Chart    Glucose, capillary     Status: Abnormal    Collection Time: 01/30/21  6:09 AM  Result Value Ref Range    Glucose-Capillary 133 (H) 70 - 99 mg/dL      Comment: Glucose reference range applies only to samples taken after fasting for at least 8 hours.    Comment 1 Notify RN      Comment 2 Document in Chart    Glucose, capillary     Status: Abnormal    Collection Time: 01/30/21 11:31 AM  Result Value Ref Range    Glucose-Capillary 131 (H) 70 - 99 mg/dL      Comment: Glucose reference range applies only to samples taken after fasting for at least 8 hours.       Imaging Results (Last 48 hours)  EP PPM/ICD IMPLANT   Result Date: 01/28/2021 SURGEON:  Loman BrooklynWill Camnitz, MD   PREPROCEDURE DIAGNOSIS:  Cryptogenic Stroke   POSTPROCEDURE DIAGNOSIS:  Cryptogenic Stroke    PROCEDURES:  1. Implantable loop recorder implantation   INTRODUCTION:  Eleanora NeighborJocelyn J Kofman is a 87 y.o. female with a history of unexplained stroke who presents today for implantable loop implantation.  The patient has had a cryptogenic stroke.  Despite an extensive workup by neurology, no reversible causes have been identified.  she has worn telemetry during which she did not have arrhythmias.  There is significant concern for possible atrial fibrillation as the cause for the patients stroke.  The patient therefore presents today for implantable loop implantation.   DESCRIPTION OF PROCEDURE:  Informed written consent was obtained, and the patient was brought to the electrophysiology lab in a fasting state.  The patient required no sedation for the procedure today.  Mapping over the patient's chest was performed by the EP lab staff to identify the area where electrograms were most prominent for ILR recording.  This area was found to be the left parasternal region over the 3rd-4th intercostal space. The patients  left chest was therefore prepped and draped in the usual sterile fashion by the EP lab staff. The skin overlying the left parasternal  region was infiltrated with lidocaine for local analgesia.  A 0.5-cm incision was made over the left parasternal region over the 3rd intercostal space.  A subcutaneous ILR pocket was fashioned using a combination of sharp and blunt dissection.  A Medtronic Reveal Linq model LINQ SN RLBG implantable loop recorder was then placed into the pocket  R waves were very prominent and measured mV. EBL<1 ml.  Steri- Strips and a sterile dressing were then applied.  There were no early apparent complications.   CONCLUSIONS:  1. Successful implantation of a Medtronic Reveal LINQ implantable loop recorder for cryptogenic stroke  2. No early apparent complications.             Medical Problem List and Plan: 1.  Right hemiparesis and functional deficits secondary to bilateral basal ganglia and corona radiata infarct             -patient may shower             -ELOS/Goals: 12-16 days. Supervision to mod I goals with PT, OT, SLP 2.  Antithrombotics: -DVT/anticoagulation:  Pharmaceutical: Lovenox             -antiplatelet therapy: DAPT X 3 weeks followed by ASA alone.  3. Pain Management: Tylenol prn.              -add voltaren gel for right 1st MTP jt pain             -observe for pain tolerance with WB in therapy 4. Mood: LCSW to follow for evaluation and support.              -chronic depression: wellbutrin SR  daily             -antipsychotic agents: N/A 5. Neuropsych: This patient is somewhat capable of making decisions on her own behalf. 6. Skin/Wound Care: Routine pressure relief measures.  7. Fluids/Electrolytes/Nutrition: Monitor I/O. Check lytes in am.  8. HTN: Monitor BP tid. Avoid hypoperfusion.   -resume coreg 3.125mg  BID  9. T2DM: Hgb A1C-6.9. Monitor BS ac/hs and use SSI for elevated BS             --Diet modification.              -on no meds  currently 10. Dyslipidemia:Continue Zocor.  11. Polycythemia?: Last CBC showed H/H 16.6/50.2              --Recheck CBC in am.  12. Intermittent hypokalemia: Recheck labs in am.   -potassium supplementation 13.  Hypothyroid: On synthroid 112 mcg for supplement.  14. Constipation: On Miralax-->increase to bid   -continue senna-s qhs.  15. Chronic combined systolic/diastolic CHF:  -resuming coreg 3.125 and lasix  bid (home meds) 16. Hx of UTI? macrobid complete. Recent UA negative  -monitor for s/s           Jacquelynn Cree, PA-C 01/30/2021  I have personally performed a face to face diagnostic evaluation of this patient and formulated the key components of the plan.  Additionally, I have personally reviewed laboratory data, imaging studies, as well as relevant notes and concur with the physician assistant's documentation above.  The patient's status has not changed from the original H&P.  Any changes in documentation from the acute care chart have been noted above.  Ranelle Oyster, MD, [place] [facility]

## 2021-02-01 NOTE — Progress Notes (Signed)
INPATIENT REHABILITATION ADMISSION NOTE   Arrival Method: Bed     Mental Orientation: A&O X4   Assessment: done   Skin: MASD to groin and perineum.    IV'S: none   Pain: none   Tubes and Drains: none   Safety Measures: in place. Oriented to floor, call bell and rehab policy   Vital Signs: done   Height and Weight: done   Rehab Orientation: done   Family: at bed side.     Notes: done

## 2021-02-01 NOTE — Progress Notes (Signed)
Attempted to reach her son via phone to report transfer to Rehab; voicemail not available and no answer; patient spoke with her son on her cell phone earlier in the afternoon.

## 2021-02-02 LAB — CBC WITH DIFFERENTIAL/PLATELET
Abs Immature Granulocytes: 0.05 10*3/uL (ref 0.00–0.07)
Basophils Absolute: 0.1 10*3/uL (ref 0.0–0.1)
Basophils Relative: 1 %
Eosinophils Absolute: 0.4 10*3/uL (ref 0.0–0.5)
Eosinophils Relative: 4 %
HCT: 45.8 % (ref 36.0–46.0)
Hemoglobin: 14.9 g/dL (ref 12.0–15.0)
Immature Granulocytes: 1 %
Lymphocytes Relative: 24 %
Lymphs Abs: 2 10*3/uL (ref 0.7–4.0)
MCH: 31.6 pg (ref 26.0–34.0)
MCHC: 32.5 g/dL (ref 30.0–36.0)
MCV: 97.2 fL (ref 80.0–100.0)
Monocytes Absolute: 0.8 10*3/uL (ref 0.1–1.0)
Monocytes Relative: 9 %
Neutro Abs: 5 10*3/uL (ref 1.7–7.7)
Neutrophils Relative %: 61 %
Platelets: 502 10*3/uL — ABNORMAL HIGH (ref 150–400)
RBC: 4.71 MIL/uL (ref 3.87–5.11)
RDW: 15 % (ref 11.5–15.5)
WBC: 8.3 10*3/uL (ref 4.0–10.5)
nRBC: 0 % (ref 0.0–0.2)

## 2021-02-02 LAB — COMPREHENSIVE METABOLIC PANEL
ALT: 20 U/L (ref 0–44)
AST: 20 U/L (ref 15–41)
Albumin: 3.2 g/dL — ABNORMAL LOW (ref 3.5–5.0)
Alkaline Phosphatase: 68 U/L (ref 38–126)
Anion gap: 7 (ref 5–15)
BUN: 8 mg/dL (ref 8–23)
CO2: 28 mmol/L (ref 22–32)
Calcium: 9 mg/dL (ref 8.9–10.3)
Chloride: 103 mmol/L (ref 98–111)
Creatinine, Ser: 1.02 mg/dL — ABNORMAL HIGH (ref 0.44–1.00)
GFR, Estimated: 53 mL/min — ABNORMAL LOW (ref >60)
Glucose, Bld: 110 mg/dL — ABNORMAL HIGH (ref 70–99)
Potassium: 3.8 mmol/L (ref 3.5–5.1)
Sodium: 138 mmol/L (ref 135–145)
Total Bilirubin: 0.7 mg/dL (ref 0.3–1.2)
Total Protein: 6.5 g/dL (ref 6.5–8.1)

## 2021-02-02 LAB — GLUCOSE, CAPILLARY
Glucose-Capillary: 107 mg/dL — ABNORMAL HIGH (ref 70–99)
Glucose-Capillary: 114 mg/dL — ABNORMAL HIGH (ref 70–99)
Glucose-Capillary: 95 mg/dL (ref 70–99)
Glucose-Capillary: 141 mg/dL — ABNORMAL HIGH (ref 70–99)

## 2021-02-02 NOTE — Evaluation (Signed)
Occupational Therapy Assessment and Plan  Patient Details  Name: Sue [name] MRN: [id number] Date of Birth: 1930-01-31  OT Diagnosis: abnormal posture, cognitive deficits, hemiplegia affecting non-dominant side and muscle weakness (generalized) Rehab Potential: Rehab Potential (ACUTE ONLY): Excellent ELOS: 12-14 days   Today's Date: 02/02/2021 OT Individual Time: 0801-0902 OT Individual Time Calculation (min): 61 min     Hospital Problem: Principal Problem:   Embolic stroke [facility])   Past Medical History:  Past Medical History:  Diagnosis Date  . CHF (congestive heart failure) ([place])   . Diabetes mellitus without complication ([place])   . Hyperlipidemia   . Thyroid disease    Past Surgical History:  Past Surgical History:  Procedure Laterality Date  . LOOP RECORDER INSERTION N/A 01/28/2021   Procedure: LOOP RECORDER INSERTION;  Surgeon: Constance Haw, MD;  Location: [facility];  Service: Cardiovascular;  Laterality: N/A;    Assessment & Plan Clinical Impression: Patient is a 87 y.o. year old female with recent admission to the hospital on 01/25/21 with sudden onset of right sided weakness and slurred speech. CTA head was negative for LVO and CT head without acute abnormality. She received IV tPA and follow up MRI brain done revealing small acute and subacute infarcts involving bilateral basal ganglia and corona radiata and small to moderate size infarct with petechial hemorrhage involving right occipitoparietal lobes. 2D echo showed EF 55-60% with grade I DD. Dr. Leonie Man recommended loop recorder to rule out A fib as cause of embolic stroke and this was placed on 06/25 by Dr. Curt Bears. .  Patient transferred to CIR on 02/01/2021 .    Patient currently requires mod with basic self-care skills secondary to muscle weakness and muscle paralysis, impaired timing and sequencing, unbalanced muscle activation and decreased coordination, decreased awareness, decreased problem  solving, decreased safety awareness and decreased memory and decreased sitting balance, decreased standing balance, decreased postural control, hemiplegia and decreased balance strategies.  Prior to hospitalization, patient could complete ADLs with modified independent .  Patient will benefit from skilled intervention to decrease level of assist with basic self-care skills and increase independence with basic self-care skills prior to discharge home with care partner.  Anticipate patient will require 24 hour supervision and follow up home health.  OT - End of Session Activity Tolerance: Tolerates 10 - 20 min activity with multiple rests Endurance Deficit: Yes OT Assessment Rehab Potential (ACUTE ONLY): Excellent OT Barriers to Discharge: Decreased caregiver support OT Barriers to Discharge Comments: Will need initai 24 hr supervision OT Patient demonstrates impairments in the following area(s): Balance;Cognition;Endurance;Motor;Safety OT Basic ADL's Functional Problem(s): Grooming;Bathing;Dressing;Toileting OT Advanced ADL's Functional Problem(s): Simple Meal Preparation OT Transfers Functional Problem(s): Tub/Shower;Toilet OT Additional Impairment(s): None OT Plan OT Intensity: Minimum of 1-2 x/day, 45 to 90 minutes OT Frequency: 5 out of 7 days OT Duration/Estimated Length of Stay: 12-14 days OT Treatment/Interventions: Balance/vestibular training;Cognitive remediation/compensation;Community reintegration;DME/adaptive equipment instruction;Neuromuscular re-education;UE/LE Strength taining/ROM;Functional mobility training;Patient/family education;Therapeutic Exercise;UE/LE Coordination activities;Therapeutic Activities;Self Care/advanced ADL retraining;Discharge planning;Pain management;Disease mangement/prevention;Psychosocial support OT Self Feeding Anticipated Outcome(s): independent OT Basic Self-Care Anticipated Outcome(s): supervision OT Toileting Anticipated Outcome(s): supervision OT  Bathroom Transfers Anticipated Outcome(s): supervision OT Recommendation Patient destination: Home Follow Up Recommendations: Home health OT;24 hour supervision/assistance Equipment Recommended: To be determined   OT Evaluation Precautions/Restrictions  Precautions Precautions: Fall;Other (comment) Precaution Comments: pt reports 2 falls in past 1 year Restrictions Weight Bearing Restrictions: No  Pain Pain Assessment Pain Scale: 0-10 Pain Score: 0-No pain Home Living/Prior Functioning Home Living Available  Help at Discharge: Other (Comment) Type of Home: Apartment Home Access: Ramped entrance Home Layout: One level Bathroom Shower/Tub: Chiropodist: Standard Bathroom Accessibility: Yes  Lives With: Son IADL History Homemaking Responsibilities: Yes Meal Prep Responsibility: Secondary Laundry Responsibility: Secondary Cleaning Responsibility: Secondary Shopping Responsibility: Secondary Current License: Yes Occupation: Retired Prior Function Level of Independence: Independent with homemaking with ambulation,Independent with gait,Independent with transfers,Requires assistive device for independence  Able to Take Stairs?: Yes Driving: Yes Leisure: Hobbies-yes (Comment) Comments: enjoys cooking and painting; used RW for mobility Vision Baseline Vision/History: Wears glasses Wears Glasses: Reading only Patient Visual Report: No change from baseline Vision Assessment?: Yes Eye Alignment: Within Functional Limits Ocular Range of Motion: Within Functional Limits Alignment/Gaze Preference: Within Defined Limits Tracking/Visual Pursuits: Able to track stimulus in all quads without difficulty Saccades: Within functional limits Visual Fields: No apparent deficits (will continue to examine further in treatment) Perception  Perception: Within Functional Limits Praxis Praxis: Intact Cognition Overall Cognitive Status: Impaired/Different from  baseline Arousal/Alertness: Awake/alert Orientation Level: Person;Place;Situation Person: Oriented Place: Oriented Situation: Oriented Year: 2022 Month: May Day of Week: Incorrect (Tuesday) Memory: Impaired Memory Impairment: Storage deficit;Retrieval deficit;Decreased recall of new information Immediate Memory Recall: Sock;Blue;Bed Memory Recall Sock: Not able to recall Memory Recall Blue: Without Cue Memory Recall Bed: Not able to recall Attention: Sustained;Focused;Selective Focused Attention: Appears intact Sustained Attention: Appears intact Sustained Attention Impairment: Verbal basic;Functional basic Selective Attention: Impaired Awareness: Impaired Awareness Impairment: Anticipatory impairment Problem Solving: Impaired Problem Solving Impairment: Functional basic Safety/Judgment: Impaired Sensation Sensation Light Touch: Appears Intact Hot/Cold: Appears Intact Proprioception: Appears Intact Stereognosis: Not tested Coordination Gross Motor Movements are Fluid and Coordinated: No Fine Motor Movements are Fluid and Coordinated: No Coordination and Movement Description: BUE coordination [place] for selfcare tasks.  Slightly slower finger to nose on the left compared to the right, but did not affect ADL function. Motor  Motor Motor: Hemiplegia Motor - Skilled Clinical Observations: Mild left UE and LE weakness noted with mobility and ADL completion.  Trunk/Postural Assessment  Cervical Assessment Cervical Assessment: Exceptions to [facility] (forward head) Thoracic Assessment Thoracic Assessment: Exceptions to [facility] (thoracic rounding) Lumbar Assessment Lumbar Assessment: Exceptions to [facility] (posterior pelvic tilt) Postural Control Postural Control: Deficits on evaluation Righting Reactions: posterior LOB with initial sitting when attempting to brush her hair  Balance Balance Balance Assessed: Yes Static Sitting Balance Static Sitting - Balance Support: Feet  supported Static Sitting - Level of Assistance: 4: Min assist Static Sitting - Comment/# of Minutes: repeated posterior LOB Dynamic Sitting Balance Dynamic Sitting - Balance Support: During functional activity Dynamic Sitting - Level of Assistance: 3: Mod assist Static Standing Balance Static Standing - Balance Support: During functional activity;Bilateral upper extremity supported Static Standing - Level of Assistance: 4: Min assist Dynamic Standing Balance Dynamic Standing - Balance Support: During functional activity Dynamic Standing - Level of Assistance: 3: Mod assist Extremity/Trunk Assessment RUE Assessment RUE Assessment: Within Functional Limits Active Range of Motion (AROM) Comments: AROM WFLS for all joints General Strength Comments: 4/5 throughout LUE Assessment LUE Assessment: Exceptions to [facility] Active Range of Motion (AROM) Comments: WFLS General Strength Comments: 3+/5 in the shoulder with 4/5 at the elbow and 3+/5 grip.  Slightly slower FM and gross motor coordination noted when attempting finger to nose testing.  Care Tool Care Tool Self Care Eating   Eating Assist Level: Set up assist    Oral Care    Oral Care Assist Level: Set up assist    Bathing  Body parts bathed by patient: Right arm;Left arm;Chest;Abdomen;Right upper leg;Left upper leg;Face;Left lower leg;Right lower leg;Front perineal area;Buttocks     Assist Level: Minimal Assistance - Patient > 75%    Upper Body Dressing(including orthotics)   What is the patient wearing?: Pull over shirt   Assist Level: Minimal Assistance - Patient > 75%    Lower Body Dressing (excluding footwear)   What is the patient wearing?: Pants;Incontinence brief Assist for lower body dressing: Moderate Assistance - Patient 50 - 74%    Putting on/Taking off footwear   What is the patient wearing?: Non-skid slipper socks;Ted hose Assist for footwear: Total Assistance - Patient < 25%       Care Tool  Toileting Toileting activity   Assist for toileting: Moderate Assistance - Patient 50 - 74%     Care Tool Bed Mobility Roll left and right activity        Sit to lying activity        Lying to sitting edge of bed activity   Lying to sitting edge of bed assist level: Moderate Assistance - Patient 50 - 74%     Care Tool Transfers Sit to stand transfer   Sit to stand assist level: Minimal Assistance - Patient > 75%    Chair/bed transfer   Chair/bed transfer assist level: Moderate Assistance - Patient 50 - 74% (no assistive device)     Toilet transfer   Assist Level: Moderate Assistance - Patient 50 - 74%     Care Tool Cognition Expression of Ideas and Wants Expression of Ideas and Wants: Some difficulty - exhibits some difficulty with expressing needs and ideas (e.g, some words or finishing thoughts) or speech is not clear   Understanding Verbal and Non-Verbal Content Understanding Verbal and Non-Verbal Content: Usually understands - understands most conversations, but misses some part/intent of message. Requires cues at times to understand   Memory/Recall Ability *first 3 days only Memory/Recall Ability *first 3 days only: That he or she is in a hospital/hospital unit;Current season    Refer to Care Plan for [name] Term Goals  SHORT TERM GOAL WEEK 1 OT Short Term Goal 1 (Week 1): Pt will complete UB dressing with supervision for two consecutive sessions. OT Short Term Goal 2 (Week 1): Pt will complete LB bathing sit to stand from wheelchair or shower seat with min guard assist. OT Short Term Goal 3 (Week 1): Pt will complete LB dressing sit to stand with min assist. OT Short Term Goal 4 (Week 1): Pt will complete toilet transfers with min guard assist using the RW for support.  Recommendations for other services: None    Skilled Therapeutic Intervention ADL ADL Eating: Set up Where Assessed-Eating: Wheelchair Grooming: Setup Where Assessed-Grooming: Wheelchair Upper  Body Bathing: Supervision/safety Where Assessed-Upper Body Bathing: Edge of bed Lower Body Bathing: Minimal assistance Where Assessed-Lower Body Bathing: Wheelchair Upper Body Dressing: Minimal assistance Where Assessed-Upper Body Dressing: Edge of bed Lower Body Dressing: Moderate assistance Where Assessed-Lower Body Dressing: Edge of bed Toileting: Moderate assistance Where Assessed-Toileting: Glass blower/designer: Moderate assistance Toilet Transfer Method: Counselling psychologist: Raised toilet seat;Grab bars Tub/Shower Transfer: Not assessed Social research officer, government: Not assessed Mobility  Bed Mobility Bed Mobility: Supine to Sit Supine to Sit: Moderate Assistance - Patient 50-74% Transfers Sit to Stand: Minimal Assistance - Patient > 75% Stand to Sit: Minimal Assistance - Patient > 75%  Session Note:  Pt worked on bathing and dressing EOB.  Min assist for initial sitting balance  with mod assist for overall dynamic sitting balance while working on bathing, dressing, and grooming tasks.  She was able to wash her UB with setup and LB with min assist.  Decreased efficiency with washing her feet secondary to not being able to pull them up where she could reach them.  Min assist for sit to stand with mod assist to pull the paper brief over her hips.  She was not able to reach either LE for donning the gripper socks after therapist donned TEDs.  Mod assist for taking 2-3 steps over to the bedside recliner without an assistive device.  Min assist when ambulating to the bathroom with mod instructional cueing to stay closer to the walker.  Decreased step length on the right side.  Finished session with pt in the wheelchair and with the call button and phone in reach and safety alarm in place.    Discharge Criteria: Patient will be discharged from OT if patient refuses treatment 3 consecutive times without medical reason, if treatment goals not met, if there is a change in medical  status, if patient makes no progress towards goals or if patient is discharged from hospital.  The above assessment, treatment plan, treatment alternatives and goals were discussed and mutually agreed upon: by patient  Andris Brothers OTR/L 02/02/2021, 5:14 PM

## 2021-02-02 NOTE — Progress Notes (Signed)
Patient incontinent of urine this morning. Bladder scanned for . Patient then voided on bedside commode and scanned for . Patient required I/O cath for of clear yellow urine.

## 2021-02-02 NOTE — Progress Notes (Signed)
PROGRESS NOTE   Subjective/Complaints:  No issues overnite, pt without pain, constipation relieved by laxative, required urinary cath yesterday   ROS- neg CP (some soreness at loop site) no SOB, no N/V/D  Objective:   No results found. Recent Labs    01/31/21 1008 02/02/21 0455  WBC 8.7 8.3  HGB 15.0 14.9  HCT 45.1 45.8  PLT 467* 502*   Recent Labs    01/31/21 1008 02/02/21 0455  NA 138 138  K 4.0 3.8  CL 103 103  CO2 28 28  GLUCOSE 115* 110*  BUN 7* 8  CREATININE 0.92 1.02*  CALCIUM 9.0 9.0    Intake/Output Summary (Last 24 hours) at 02/02/2021 0942 Last data filed at 02/02/2021 0515 Gross per 24 hour  Intake 100 ml  Output 600 ml  Net -500 ml        Physical Exam: Vital Signs Blood pressure (!) 148/89, pulse 71, temperature 98.1 F (36.7 C), resp. rate 18, height 5\' 4"  (1.626 m), weight 83 kg, SpO2 93 %.  General: No acute distress Mood and affect are appropriate Heart: Regular rate and rhythm no rubs murmurs or extra sounds Lungs: Clear to auscultation, breathing unlabored, no rales or wheezes Abdomen: Positive bowel sounds, soft nontender to palpation, nondistended Extremities: No clubbing, cyanosis, or edema Skin: No evidence of breakdown, no evidence of rash Neurologic: Cranial nerves II through XII intact, motor strength is 4/5 in bilateral deltoid, bicep, tricep, grip, hip flexor, knee extensors, ankle dorsiflexor and plantar flexor  Cerebellar exam intention tremor vs mild dysmetria  finger to nose to finger as well as heel to shin in bilateral upper and lower extremities Musculoskeletal: Full range of motion in all 4 extremities. No joint swelling    Assessment/Plan: 1. Functional deficits which require 3+ hours per day of interdisciplinary therapy in a comprehensive inpatient rehab setting.  Physiatrist is providing close team supervision and 24 hour management of active medical  problems listed below.  Physiatrist and rehab team continue to assess barriers to discharge/monitor patient progress toward functional and medical goals  Care Tool:  Bathing              Bathing assist       Upper Body Dressing/Undressing Upper body dressing        Upper body assist Assist Level: Minimal Assistance - Patient > 75%    Lower Body Dressing/Undressing Lower body dressing            Lower body assist Assist for lower body dressing: Maximal Assistance - Patient 25 - 49%     Toileting Toileting    Toileting assist       Transfers Chair/bed transfer  Transfers assist           Locomotion Ambulation   Ambulation assist              Walk 10 feet activity   Assist           Walk 50 feet activity   Assist           Walk 150 feet activity   Assist           Walk 10  feet on uneven surface  activity   Assist           Wheelchair     Assist               Wheelchair 50 feet with 2 turns activity    Assist            Wheelchair 150 feet activity     Assist          Blood pressure (!) 148/89, pulse 71, temperature 98.1 F (36.7 C), resp. rate 18, height 5\' 4"  (1.626 m), weight 83 kg, SpO2 93 %.  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 cardioembolic has loop recorder  -patient may shower -ELOS/Goals: 12-16 days. Supervision to mod I goals with PT, OT, SLP 2. Antithrombotics: -DVT/anticoagulation:Pharmaceutical:Lovenox -antiplatelet therapy: DAPT X 3 weeks followed by ASA alone. 3. Pain Management:Tylenol prn. -add voltaren gel for right 1st MTP jt pain -observe for pain tolerance with WB in therapy 4. Mood:LCSW to follow for evaluation and support. -chronic depression: wellbutrin SR 150mg   daily -antipsychotic agents: N/A 5. Neuropsych: This patientis somewhatcapable of making decisions on herown behalf. 6. Skin/Wound Care:Routine pressure relief measures. 7. Fluids/Electrolytes/Nutrition:Monitor I/O. Check lytes in am.  8. HTN: Monitor BP tid. Avoid hypoperfusion.              -resume coreg 3.125mg  BID  Vitals:   02/01/21 1924 02/02/21 0507  BP: 116/87 (!) 148/89  Pulse: 75 71  Resp: 18 18  Temp: 98.1 F (36.7 C) 98.1 F (36.7 C)  SpO2: 92% 93%   9. T2DM: Hgb A1C-6.9. Monitor BS ac/hs and use SSI for elevated BS --Diet modification.   CBG (last 3)  Recent Labs    02/01/21 1656 02/01/21 2057 02/02/21 0625  GLUCAP 104* 113* 107*   10. Dyslipidemia:Continue Zocor.  11. Polycythemia?: Last CBC showed H/H 16.6/50.2  --Recheck CBC in am.  12. Intermittent hypokalemia: Recheck labs in am.              -potassium supplementation 13. Hypothyroid: On synthroid 112 mcg for supplement.  14. Constipation: On Miralax-->increase to bid              -continue senna-s qhs. 15. Chronic combined systolic/diastolic CHF:             -resuming coreg 3.125 and lasix 40mg  bid (home meds) 16. Hx of UTI? macrobid complete. Recent UA negative             -monitor for s/s    LOS: 1 days A FACE TO FACE EVALUATION WAS PERFORMED  02/04/21 02/02/2021, 9:42 AM

## 2021-02-02 NOTE — Evaluation (Signed)
Speech Language Pathology Assessment and Plan  Patient Details  Name: Sue [name] MRN: [id number] Date of Birth: 04/16/1930  SLP Diagnosis: Cognitive Impairments  Rehab Potential: Good ELOS: 12-14 days    Today's Date: 02/02/2021 SLP Individual Time: 1103-1207 SLP Individual Time Calculation (min): 51 min   Hospital Problem: Principal Problem:   Embolic stroke [facility])  Past Medical History:  Past Medical History:  Diagnosis Date  . CHF (congestive heart failure) ([place])   . Diabetes mellitus without complication ([place])   . Hyperlipidemia   . Thyroid disease    Past Surgical History:  Past Surgical History:  Procedure Laterality Date  . LOOP RECORDER INSERTION N/A 01/28/2021   Procedure: LOOP RECORDER INSERTION;  Surgeon: Constance Haw, MD;  Location: [place] [facility];  Service: Cardiovascular;  Laterality: N/A;    Assessment / Plan / Recommendation Clinical Impression Patient is a 87 y.o. year old female with history of HTN, T2DM, depression who was admitted on 01/25/21 with sudden onset of right sided weakness and slurred speech. CTA head was negative for LVO and CT head without acute abnormality. She received IV tPA and follow up MRI brain done revealing small acute and subacute infarcts involving bilateral basal ganglia and corona radiata and small to moderate size infarct with petechial hemorrhage involving right occipitoparietal lobes. 2D echo showed EF 55-60% with grade I DD. Dr. Leonie Man recommended loop recorder to rule out A fib as cause of embolic stroke and this was placed on 06/25 by Dr. Curt Bears. She is to continue DAPT X 3 weeks followed by ASA alone for secondary stroke prevention. She continues to be limited by right sided weakness with balance deficits as well as deficits in memory and higher level cognitive tasks. CIR was recommended due to functional decline. Patient transferred to CIR on 02/01/2021.   Pt presents with severe-moderate cognitive impairments,  deficits include basic problem solving, emergent awareness, short term recall and sustained attention, further impacted by being hard of hearing. Formal cognitive linguistic assessment utilizing SLUMS, pt scored 18 out 30 (n=>27.) Pt is aware of acute deficits, supports right side weakness and confusion but lacks ability to monitor and correct errors on own. Hearing amplifiers were effective in improving carrying over of directions. Pt would benefit from skilled ST services in order to maximize functional independence and reduce burden of care, requiring 24 hour supervision at discharge with continued skilled ST services.   Skilled Therapeutic Interventions          Skilled ST services focused on cognitive skills. SLP facilitated administration of cognitive linguistic formal assessment and provided education of results. SLP and pt collaborated to set goals for cognitive linguistic needs during length of stay. All questions answered to satisfaction.  Pt requested to transfer into bed via RW, pt required max A verbal cues for safety awareness with walker. Pt was left in room with call bell within reach and bed alarm set. SLP recommends to continue skilled services.  SLP Assessment  Patient will need skilled [place] [facility] Services during CIR admission    Recommendations  Patient destination: Home Follow up Recommendations: Home Health SLP;24 hour supervision/assistance Equipment Recommended: None recommended by SLP    SLP Frequency 3 to 5 out of 7 days   SLP Duration  SLP Intensity  SLP Treatment/Interventions 12-14 days  Minumum of 1-2 x/day, 30 to 90 minutes  Cognitive remediation/compensation;Cueing hierarchy;Functional tasks;Internal/external aids;Patient/family education    Pain Pain Assessment Pain Scale: 0-10 Pain Score: 0-No pain  Prior  Functioning Cognitive/Linguistic Baseline: Information not available Type of Home: House Available Help at Discharge: Family;Available  24 hours/day  SLP Evaluation Cognition Overall Cognitive Status: Impaired/Different from baseline Arousal/Alertness: Awake/alert Orientation Level: Oriented to time;Disoriented to time;Oriented to place;Oriented to person Attention: Sustained Sustained Attention: Impaired Sustained Attention Impairment: Verbal basic;Functional basic Memory: Impaired Memory Impairment: Storage deficit;Retrieval deficit;Decreased recall of new information Awareness: Impaired Awareness Impairment: Emergent impairment Problem Solving: Impaired Problem Solving Impairment: Verbal basic;Functional basic Safety/Judgment: Impaired  Comprehension Auditory Comprehension Overall Auditory Comprehension: Impaired Yes/No Questions: Within Functional Limits Commands: Impaired One Step Basic Commands: 75-100% accurate Two Step Basic Commands: 75-100% accurate Conversation: Simple Interfering Components: Hearing Visual Recognition/Discrimination Discrimination: Within Function Limits Reading Comprehension Reading Status: Within funtional limits Expression Expression Primary Mode of Expression: Verbal Verbal Expression Overall Verbal Expression: Appears within functional limits for tasks assessed Divergent: 75-100% accurate Oral Motor Oral Motor/Sensory Function Overall Oral Motor/Sensory Function: Within functional limits Motor Speech Overall Motor Speech: Appears within functional limits for tasks assessed  Care Tool Care Tool Cognition Expression of Ideas and Wants Expression of Ideas and Wants: Some difficulty - exhibits some difficulty with expressing needs and ideas (e.g, some words or finishing thoughts) or speech is not clear   Understanding Verbal and Non-Verbal Content Understanding Verbal and Non-Verbal Content: Usually understands - understands most conversations, but misses some part/intent of message. Requires cues at times to understand   Memory/Recall Ability *first 3 days only  Memory/Recall Ability *first 3 days only: That he or she is in a hospital/hospital unit;Current season        Short Term Goals: Week 1: SLP Short Term Goal 1 (Week 1): Pt will demonstrate sustained attention in 30 minute intervals with min A verbal cues. SLP Short Term Goal 2 (Week 1): Pt will demonstrate recall of novel and daily information with visual aid given min A verbal cues. SLP Short Term Goal 3 (Week 1): Pt will demonstrate self-monitoring and self-awareness of functional errors with mod A verbal cues.  Refer to Care Plan for Long Term Goals  Recommendations for other services: None   Discharge Criteria: Patient will be discharged from SLP if patient refuses treatment 3 consecutive times without medical reason, if treatment goals not met, if there is a change in medical status, if patient makes no progress towards goals or if patient is discharged from hospital.  The above assessment, treatment plan, treatment alternatives and goals were discussed and mutually agreed upon: by patient  Galia Rahm  [facility] 02/02/2021, 12:54 PM

## 2021-02-02 NOTE — Progress Notes (Signed)
Inpatient Rehabilitation Medication Review by a Pharmacist  A complete drug regimen review was completed for this patient to identify any potential clinically significant medication issues.  Clinically significant medication issues were identified:  yes   Type of Medication Issue Identified Description of Issue Urgent (address now) Non-Urgent (address on AM team rounds) Plan Plan Accepted by Provider? (Yes / No / Pending AM Rounds)  Drug Interaction(s) (clinically significant)       Duplicate Therapy       Allergy       No Medication Administration End Date  Per neuro 3 weeks DAPT then aspirin alone, anticipated end date 6/13 NA F/u end date NA  Incorrect Dose       Additional Drug Therapy Needed       Other  Acute admission discharge list includes  - "bupropion 150 mg daily" (taking BID inpatient and continued BID in CIR)  - "carvedilol 3.125 mg 2 tablets BID" (taking 1 tab BID inpatient and continued BID in CIR) - "continue macrobid", however patient was not taking this prior to admission (old med) and was not taking while in acute admission (not ordered in CIR) NA Error on discharge med rec but ordered correctly on transfer to CIR. No changes needed NA    Pharmacist comments: Pharmacist will continue to monitor for appropriate Plavix end date  Time spent performing this drug regimen review (minutes):  10  Laverna Peace, PharmD PGY-1 Pharmacy Resident 02/02/2021 2:43 PM Please see AMION for all pharmacy numbers

## 2021-02-02 NOTE — Evaluation (Signed)
Physical Therapy Assessment and Plan  Patient Details  Name: Sue [name] MRN: [id number] Date of Birth: 1929-12-09  PT Diagnosis: Abnormal posture, Abnormality of gait, Cognitive deficits, Difficulty walking, Hemiparesis dominant and Muscle weakness Rehab Potential: Good ELOS: ~2 weeks   Today's Date: 02/02/2021 PT Individual Time: 1937-9024 PT Individual Time Calculation (min): 68 min    Hospital Problem: Principal Problem:   Embolic stroke [name])   Past Medical History:  Past Medical History:  Diagnosis Date  . CHF (congestive heart failure) ([place])   . Diabetes mellitus without complication ([place])   . Hyperlipidemia   . Thyroid disease    Past Surgical History:  Past Surgical History:  Procedure Laterality Date  . LOOP RECORDER INSERTION N/A 01/28/2021   Procedure: LOOP RECORDER INSERTION;  Surgeon: Constance Haw, MD;  Location: [place] [facility];  Service: Cardiovascular;  Laterality: N/A;    Assessment & Plan Clinical Impression: Patient is a 87 y.o. year old female with history of HTN, T2DM, depression who was admitted on 01/25/21 with sudden onset of right sided weakness and slurred speech. CTA head was negative for LVO and CT head without acute abnormality. She received IV tPA and follow up MRI brain done revealing small acute and subacute infarcts involving bilateral basal ganglia and corona radiata and small to moderate size infarct with petechial hemorrhage involving right occipitoparietal lobes. 2D echo showed EF 55-60% with grade I DD. Dr. Leonie Man recommended loop recorder to rule out A fib as cause of embolic stroke and this was placed on 06/25 by Dr. Curt Bears. She is to continue DAPT X 3 weeks followed by ASA alone for secondary stroke prevention. She continues to be limited by right sided weakness with balance deficits as well as deficits in memory and higher level cognitive tasks. CIR was recommended due to functional decline. Patient transferred to CIR on  02/01/2021 .   Patient currently requires mod assist with mobility secondary to muscle weakness, decreased cardiorespiratoy endurance, impaired timing and sequencing, unbalanced muscle activation and decreased motor planning, decreased midline orientation, decreased awareness, decreased problem solving, decreased safety awareness and delayed processing and decreased sitting balance, decreased standing balance, decreased postural control, hemiplegia and decreased balance strategies.  Prior to hospitalization, patient was modified independent  with mobility and lived with Son in a Apartment.  Home access is  Level entry.  Patient will benefit from skilled PT intervention to maximize safe functional mobility, minimize fall risk and decrease caregiver burden for planned discharge home with 24 hour assist.  Anticipate patient will benefit from follow up [facility] at discharge.  PT - End of Session Activity Tolerance: Tolerates 30+ min activity with multiple rests Endurance Deficit: Yes Endurance Deficit Description: requires frequent seated rest breaks due to labored breathing and fatigue PT Assessment Rehab Potential (ACUTE/IP ONLY): Good PT Barriers to Discharge: Decreased caregiver support;Lack of/limited family support PT Patient demonstrates impairments in the following area(s): Balance;Perception;Behavior;Safety;Edema;Sensory;Endurance;Skin Integrity;Motor;Nutrition;Pain PT Transfers Functional Problem(s): Bed Mobility;Bed to Chair;Car;Furniture PT Locomotion Functional Problem(s): Ambulation;Wheelchair Mobility;Stairs PT Plan PT Intensity: Minimum of 1-2 x/day ,45 to 90 minutes PT Frequency: 5 out of 7 days PT Duration Estimated Length of Stay: ~2 weeks PT Treatment/Interventions: Ambulation/gait training;Community reintegration;DME/adaptive equipment instruction;Neuromuscular re-education;Psychosocial support;Stair training;UE/LE Strength taining/ROM;Wheelchair  propulsion/positioning;Balance/vestibular training;Discharge planning;Pain management;Functional electrical stimulation;Skin care/wound management;Therapeutic Activities;UE/LE Coordination activities;Cognitive remediation/compensation;Disease management/prevention;Functional mobility training;Patient/family education;Splinting/orthotics;Therapeutic Exercise;Visual/perceptual remediation/compensation PT Transfers Anticipated Outcome(s): supervision using LRAD PT Locomotion Anticipated Outcome(s): CGA using LRAD PT Recommendation Follow Up Recommendations: Home health PT;24 hour supervision/assistance Patient destination:  Home Equipment Recommended: To be determined Equipment Details: has RW   PT Evaluation Precautions/Restrictions  Precautions Precautions: Fall;Other (comment) Precaution Comments: pt reports 2 falls in past 1 year Pain Pain Assessment Pain Scale: 0-10 Pain Score: 0-No pain Home Living/Prior Functioning Home Living Available Help at Discharge: Family;Other (Comment) (pt reports her son works in [employer]) Type of Home: Apartment Home Access: Ramped entrance [place]: One level  Lives With: Son Prior Function Level of Independence: Independent with homemaking with ambulation;Independent with gait;Independent with transfers;Requires assistive device for independence (uses RW for all mobility) Driving: No Leisure: Hobbies-yes (Comment) Comments: enjoys cooking and painting; used RW for mobility Perception  Perception: Impaired Spatial Orientation: impaired upright, midline orientation Praxis Praxis: Impaired Praxis Impairment Details: Motor planning Praxis-Other Comments: poor motor planning noted with bed mobility and transfers  Cognition Overall Cognitive Status: Impaired/Different from baseline Arousal/Alertness: Awake/alert Orientation Level: Oriented to person;Oriented to place;Oriented to situation;Disoriented to time Attention: Focused;Sustained Focused  Attention: Appears intact Sustained Attention: Impaired Memory: Impaired Awareness: Impaired Problem Solving: Impaired Safety/Judgment: Impaired Sensation Sensation Light Touch: Appears Intact Hot/Cold: Not tested Proprioception: Impaired by gross assessment (noted in  BLEs during gait) Stereognosis: Not tested Coordination Gross Motor Movements are Fluid and Coordinated: No Fine Motor Movements are Fluid and Coordinated: No Coordination and Movement Description: gross motor movements impaired due to poor balance and R LE weakness Motor  Motor Motor: Hemiplegia;Other (comment);Abnormal postural alignment and control Motor - Skilled Clinical Observations: Impaired R LE strength compared to L; poor trunk control and impaired midine orientation   Trunk/Postural Assessment  Cervical Assessment Cervical Assessment: Exceptions to [facility] (forward head) Thoracic Assessment Thoracic Assessment: Exceptions to [facility] (rounded shoulders with thoracic kyphosis) Lumbar Assessment Lumbar Assessment: Exceptions to [facility] (posterior pelvic tilt in sitting) Postural Control Postural Control: Deficits on evaluation Trunk Control: impaired with repeated posterior lean/LOB sitting EOB Righting Reactions: delayed and insufficient to prevent posterior trunk LOB sitting EOB without max assist  Balance Balance Balance Assessed: Yes Static Sitting Balance Static Sitting - Balance Support: Feet supported Static Sitting - Level of Assistance: 4: Min assist Static Sitting - Comment/# of Minutes: repeated posterior LOB Dynamic Sitting Balance Dynamic Sitting - Balance Support: During functional activity Dynamic Sitting - Level of Assistance: 3: Mod assist Static Standing Balance Static Standing - Balance Support: During functional activity;Bilateral upper extremity supported Static Standing - Level of Assistance: 3: Mod assist;4: Min assist Dynamic Standing Balance Dynamic Standing - Balance Support: During  functional activity Dynamic Standing - Level of Assistance: 3: Mod assist;2: Max assist Extremity Assessment      RLE Assessment RLE Assessment: Exceptions to [facility] Active Range of Motion (AROM) Comments: WFL General Strength Comments: assessed in sitting RLE Strength Right Hip Flexion: 3/5 Right Knee Flexion: 3+/5 Right Knee Extension: 4-/5 Right Ankle Dorsiflexion: 4-/5 Right Ankle Plantar Flexion: 4-/5 LLE Assessment LLE Assessment: Exceptions to [facility] Active Range of Motion (AROM) Comments: WFL General Strength Comments: assessed in sitting LLE Strength Left Hip Flexion: 4/5 Left Knee Flexion: 4/5 Left Knee Extension: 4+/5 Left Ankle Dorsiflexion: 4/5 Left Ankle Plantar Flexion: 4/5  Care Tool Care Tool Bed Mobility Roll left and right activity   Roll left and right assist level: Moderate Assistance - Patient 50 - 74%    Sit to lying activity   Sit to lying assist level: Moderate Assistance - Patient 50 - 74%    Lying to sitting edge of bed activity   Lying to sitting edge of bed assist level: Moderate Assistance -  Patient 50 - 74%     Care Tool Transfers Sit to stand transfer   Sit to stand assist level: Moderate Assistance - Patient 50 - 74% Sit to stand assistive device: Walker  Chair/bed transfer   Chair/bed transfer assist level: Moderate Assistance - Patient 50 - 74% Chair/bed transfer assistive device: Proofreader transfer assist level: Moderate Assistance - Patient 50 - 74% Health visitor Comment: RW    Care Tool Locomotion Ambulation   Assist level: Moderate Assistance - Patient 50 - 74% Assistive device: Walker-rolling Max distance: 68ft  Walk 10 feet activity   Assist level: Moderate Assistance - Patient - 50 - 74% Assistive device: Walker-rolling   Walk 50 feet with 2 turns activity   Assist level: Moderate Assistance - Patient - 50 - 74% Assistive device: Walker-rolling  Walk 150 feet  activity Walk 150 feet activity did not occur: Safety/medical concerns      Walk 10 feet on uneven surfaces activity Walk 10 feet on uneven surfaces activity did not occur: Safety/medical concerns      Stairs Stair activity did not occur: N/A (pt does not navigate stairs at baseline)        Walk up/down 1 step activity Walk up/down 1 step or curb (drop down) activity did not occur: Safety/medical concerns     Walk up/down 4 steps activity did not occuR: N/A  Walk up/down 4 steps activity      Walk up/down 12 steps activity Walk up/down 12 steps activity did not occur: N/A      Pick up small objects from floor Pick up small object from the floor (from standing position) activity did not occur: Safety/medical concerns      Wheelchair Will patient use wheelchair at discharge?: No (TBD)          Wheel 50 feet with 2 turns activity      Wheel 150 feet activity        Refer to Care Plan for Long Term Goals  SHORT TERM GOAL WEEK 1 PT Short Term Goal 1 (Week 1): Pt will perform supine<>sit with CGA PT Short Term Goal 2 (Week 1): Pt will perform sit<>stands using LRAD with CGA PT Short Term Goal 3 (Week 1): Pt will perform stand pivot transfers using LRAD with CGA PT Short Term Goal 4 (Week 1): Pt will ambulate at least 82ft using LRAD with CGA  Recommendations for other services: None   Skilled Therapeutic Intervention Pt received supine in bed and agreeable to therapy session. Evaluation completed (see details above) with patient education regarding purpose of PT evaluation, PT POC and goals, therapy schedule, weekly team meetings, and other CIR information including safety plan and fall risk safety. Pt performed the below mobility tasks with the specified levels of assistance. Upon sitting EOB pt demos repeated/continuous posterior lean/LOB despite max assist to scoot hips forward to allow B foot support on ground. During sit<>stand and stand pivot transfer using RW pt demos  strong posterior lean as well requiring heavy mod assist to maintain upright while stepping backwards towards w/c prior to sitting down with max cuing and facilitation for weight shifting. Throughout mobility tasks pt demos heavy/labored breathing; however, SpO2 monitored during session and >95%. During gait training pt demos slow gait speed with poor L weight shift during stance phase and poor AD management requiring min/mod assist for balance. Throughout session pt demos impaired motor  planning and problem solving how to complete mobility tasks requiring cuing for safety and sequencing. At end of session pt left supine in bed with needs in reach, [facility] elevated, and bed alarm on.   Mobility Bed Mobility Bed Mobility: Supine to Sit;Sit to Supine Supine to Sit: Moderate Assistance - Patient 50-74% Sit to Supine: Moderate Assistance - Patient 50-74% Transfers Transfers: Sit to Stand;Stand to Sit;Stand Pivot Transfers Sit to Stand: Moderate Assistance - Patient 50-74% Stand to Sit: Moderate Assistance - Patient 50-74% Stand Pivot Transfers: Moderate Assistance - Patient 50 - 74% Stand Pivot Transfer Details: Verbal cues for precautions/safety;Verbal cues for gait pattern;Verbal cues for technique;Manual facilitation for weight shifting;Manual facilitation for placement;Tactile cues for placement;Tactile cues for weight shifting;Tactile cues for sequencing;Tactile cues for posture;Verbal cues for sequencing;Verbal cues for safe use of DME/AE Transfer (Assistive device): Rolling walker Locomotion  Gait Ambulation: Yes Gait Assistance: Minimal Assistance - Patient > 75%;Moderate Assistance - Patient 50-74% Gait Distance (Feet): 50 Feet Assistive device: Rolling walker Gait Assistance Details: Tactile cues for weight shifting;Tactile cues for sequencing;Tactile cues for posture;Verbal cues for safe use of DME/AE;Verbal cues for gait pattern;Verbal cues for technique;Manual facilitation for weight  shifting Gait Gait: Yes Gait Pattern: Impaired Gait Pattern: Step-through pattern;Decreased step length - left;Decreased step length - right;Decreased stride length;Narrow base of support Gait velocity: decreased Stairs / Additional Locomotion Stairs: No (pt reports not needing to perform stair naviation in years) Wheelchair Mobility Wheelchair Mobility: No   Discharge Criteria: Patient will be discharged from PT if patient refuses treatment 3 consecutive times without medical reason, if treatment goals not met, if there is a change in medical status, if patient makes no progress towards goals or if patient is discharged from hospital.  The above assessment, treatment plan, treatment alternatives and goals were discussed and mutually agreed upon: by patient  Tawana Scale , PT, DPT, CSRS 02/02/2021, 12:14 PM

## 2021-02-02 NOTE — Progress Notes (Signed)
PMR Admission Coordinator Pre-Admission Assessment   Patient: Sue [name] is an 87 y.o., female MRN: [id number] DOB: December 09, 1929 Height: 5\' 4"  (162.6 cm) Weight: 90 kg                                                                                                                                                  Insurance Information HMO: yes    PPO:      PCP:      IPA:      80/20:      OTHER:  PRIMARY: [employer]      Policy#: [id number]      Subscriber: pt CM Name: Lynelle Smoke      Phone#: [phone]     Fax#: EPIC  Pre-Cert#: [id number] auth for CIR provided by Tammy at [facility].        Employer:  Benefits:  Phone #: [phone]     Name:  Eff. Date: 09/06/2014     Deduct: 0      Out of Pocket Max: $5000 (met $20)      Life Max: n/a  CIR: $225/day for days 1-6      SNF: 20 full days Outpatient:      Co-Pay: $20/visit  Home Health: 80%      Co-Pay: 20% DME: 80%     Co-Pay: 20% Providers:  SECONDARY:       Policy#:       Phone#:    Development worker, community:       Phone#:    The Engineer, petroleum" for patients in Inpatient Rehabilitation Facilities with attached "Privacy Act [place] Records" was provided and verbally reviewed with: Family   Emergency Contact Information         Contact Information     Name Relation Home Work [address]    [place], [place] Son     [phone]       Current Medical History  Patient Admitting Diagnosis: 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 CTA head was negative for LVO and CT head without acute abnormality. She received IV tPA and follow up MRI brain done revealing small acute and subacute infarcts involving bilateral basal ganglia and corona radiata and small to moderate size infarct with petechial  hemorrhage involving right occipitoparietal lobes. 2D echo showed EF 55-60% with grade I DD.   Dr. Leonie Man recommended loop recorder to rule out A fib as cause of embolic stroke and this was placed on 06/25 by Dr. Curt Bears. She is to continue DAPT X 3 weeks followed by ASA alone for secondary stroke prevention. She continues to be limited by right sided weakness with  balance deficits as well as deficits in memory and higher level cognitive tasks. CIR was recommended due to functional decline.    Complete NIHSS TOTAL: 0 Glasgow Coma Scale Score: 15   Past Medical History      Past Medical History:  Diagnosis Date  . CHF (congestive heart failure) ([place])    . Diabetes mellitus without complication ([place])    . Hyperlipidemia    . Thyroid disease        Family History  family history includes Diabetes in her mother.   Prior Rehab/Hospitalizations:  Has the patient had prior rehab or hospitalizations prior to admission? Yes   Has the patient had major surgery during 100 days prior to admission? Yes   Current Medications    Current Facility-Administered Medications:  .   stroke: mapping our early stages of recovery book, , Does not apply, Once, Beulah Gandy A, NP .  0.9 %  sodium chloride infusion, , Intravenous, Continuous, August Albino, NP, Stopped at 01/26/21 1835 .  acetaminophen (TYLENOL) tablet 650 mg, 650 mg, Oral, Q4H PRN, 650 mg at 01/25/21 2230 **OR** acetaminophen (TYLENOL) 160 MG/5ML solution 650 mg, 650 mg, Per Tube, Q4H PRN **OR** acetaminophen (TYLENOL) suppository 650 mg, 650 mg, Rectal, Q4H PRN, Beulah Gandy A, NP .  aspirin chewable tablet 81 mg, 81 mg, Oral, Daily, Garvin Fila, MD, 81 mg at 01/30/21 1130 .  bisacodyl (DULCOLAX) suppository 10 mg, 10 mg, Rectal, Daily PRN, Bailey-Modzik, Delila A, NP .  buPROPion (WELLBUTRIN SR) 12 hr tablet 150 mg, 150 mg, Oral, BID, Garvin Fila, MD, 150 mg at 01/30/21 1131 .  Chlorhexidine Gluconate Cloth 2 % PADS 6 each, 6 each,  Topical, Daily, Amie Portland, MD, 6 each at 01/30/21 1130 .  clopidogrel (PLAVIX) tablet 75 mg, 75 mg, Oral, Daily, Olivencia-Simmons, Ivelisse, NP, 75 mg at 01/30/21 1130 .  enoxaparin (LOVENOX) injection 40 mg, 40 mg, Subcutaneous, Q24H, Garvin Fila, MD, 40 mg at 01/29/21 2059 .  Gerhardt's butt cream, , Topical, BID, Amie Portland, MD, Given at 01/30/21 1131 .  insulin aspart (novoLOG) injection 0-9 Units, 0-9 Units, Subcutaneous, TID WC, Bailey-Modzik, Delila A, NP, 1 Units at 01/30/21 1139 .  levothyroxine (SYNTHROID) tablet 112 mcg, 112 mcg, Oral, QAC breakfast, Beulah Gandy A, NP, 112 mcg at 01/30/21 0506 .  MEDLINE mouth rinse, 15 mL, Mouth Rinse, BID, Amie Portland, MD, 15 mL at 01/30/21 1131 .  pantoprazole (PROTONIX) EC tablet 40 mg, 40 mg, Oral, QHS, Rozann Lesches, RPH, 40 mg at 01/29/21 2105 .  polyethylene glycol (MIRALAX / GLYCOLAX) packet 17 g, 17 g, Oral, Daily, Bailey-Modzik, Delila A, NP, 17 g at 01/30/21 1137 .  senna-docusate (Senokot-S) tablet 1 tablet, 1 tablet, Oral, QHS, Bailey-Modzik, Delila A, NP .  simvastatin (ZOCOR) tablet 20 mg, 20 mg, Oral, q1800, Olivencia-Simmons, Ivelisse, NP, 20 mg at 01/29/21 1800   Patients Current Diet:     Diet Order                      Diet heart healthy/carb modified Room service appropriate? Yes; Fluid consistency: Thin  Diet effective now                      Precautions / Restrictions Precautions Precautions: Fall Precaution Comments: pt reports 2 falls in past 1 year Restrictions Weight Bearing Restrictions: No    Has the patient had 2 or more falls or a fall with injury  in the past year?Yes   Prior Activity Level Limited Community (1-2x/wk): walked with a RW prior to admission, wasn't driving, living with son, Virgel Bouquet   Prior Functional Level Prior Function Level of Independence: Independent with assistive device(s) Comments: walked with RW   Self Care: Did the patient need help bathing, dressing, using  the toilet or eating?  Independent   Indoor Mobility: Did the patient need assistance with walking from room to room (with or without device)? Independent   Stairs: Did the patient need assistance with internal or external stairs (with or without device)? Independent   Functional Cognition: Did the patient need help planning regular tasks such as shopping or remembering to take medications? Needed some help   Home Assistive Devices / Equipment Home Equipment: Walker - 2 wheels,Shower seat,Grab bars - tub/shower,Hand held shower head   Prior Device Use: Indicate devices/aids used by the patient prior to current illness, exacerbation or injury? Walker   Current Functional Level Cognition   Arousal/Alertness: Awake/alert Overall Cognitive Status: Impaired/Different from baseline Current Attention Level: Sustained Orientation Level: Oriented X4 Following Commands: Follows multi-step commands inconsistently,Follows one step commands with increased time,Follows multi-step commands with increased time Safety/Judgement: Decreased awareness of safety,Decreased awareness of deficits General Comments: Pt somewhat aware of STM memory deficits. Pt able to follow commands with increased time, but difficulty with multistep commands requiring cues to remember to find her room while completing functional ambulation in the hallway. Pt AOx4, but appears to get amount of days she has been in hospital confused. Attention: Sustained Sustained Attention: Appears intact Memory: Impaired Memory Impairment: Storage deficit,Retrieval deficit,Decreased recall of new information Awareness: Impaired Awareness Impairment: Intellectual impairment,Emergent impairment,Anticipatory impairment Problem Solving: Impaired Problem Solving Impairment: Verbal complex Safety/Judgment: Impaired    Extremity Assessment (includes Sensation/Coordination)   Upper Extremity Assessment: Generalized weakness  Lower Extremity  Assessment: Defer to PT evaluation     ADLs   Overall ADL's : Needs assistance/impaired Eating/Feeding: Set up,Sitting Eating/Feeding Details (indicate cue type and reason): Pt eating breakfast once set up in recliner. No need for assistance to open containers and able to organize plate. Grooming: Minimal assistance,Cueing for sequencing,Standing Grooming Details (indicate cue type and reason): Min Guard-Min A for safety in standing at sink. Cues for sequencing and use of soap Upper Body Bathing: Set up,Supervision/ safety,Sitting Lower Body Bathing: Moderate assistance,Sit to/from stand Upper Body Dressing : Minimal assistance,Sitting Upper Body Dressing Details (indicate cue type and reason): donned second gown Lower Body Dressing: Maximal assistance,Sit to/from stand Lower Body Dressing Details (indicate cue type and reason): Unable to bend foward to safely don socks. Max A to don socks. Toilet Transfer: Minimal assistance,Ambulation,Regular Toilet,Grab bars Armed forces technical officer Details (indicate cue type and reason): Min A for safety descent and to power up into standing Toileting- Clothing Manipulation and Hygiene: Minimal assistance,Sitting/lateral lean Toileting - Clothing Manipulation Details (indicate cue type and reason): Min A for manage gown. Pt initatially not performing hygiene and requiring Mod cues to initate. Pt presenting with poor awareness and requiring Min A to manage gown for her to reach and perform peri care Functional mobility during ADLs: Minimal assistance,Rolling walker General ADL Comments: Pt performing stand pivot to recliner and then forward mobility forward with RW. Pt presenting with decreased activity tolerance, balance, and strength     Mobility   Overal bed mobility: Needs Assistance Bed Mobility: Supine to Sit,Sit to Supine Supine to sit: Min guard Sit to supine: Min guard General bed mobility comments: min guard for safety and  guidance     Transfers    Overall transfer level: Needs assistance Equipment used: Rolling walker (2 wheeled) Transfers: Sit to/from Stand Sit to Stand: Min assist Stand pivot transfers: Min assist General transfer comment: minA for power up into standing from low bed surface and toilet. Cues for hand placement     Ambulation / Gait / Stairs / Wheelchair Mobility   Ambulation/Gait Ambulation/Gait assistance: Min assist Gait Distance (Feet): 10 Feet (x10') Assistive device: Rolling walker (2 wheeled) Gait Pattern/deviations: Step-through pattern,Decreased stride length,Trunk flexed,Drifts right/left General Gait Details: To and from bathroom. Refused further ambulation due to fatigue. Patient required cueing for RW management and assist for RW maneuvering and balance. Gait velocity: decr Gait velocity interpretation: <1.31 ft/sec, indicative of household ambulator     Posture / Balance Dynamic Sitting Balance Sitting balance - Comments: Able to maintain static sitting. Difficulty maintaining balance to don socks Balance Overall balance assessment: Needs assistance,History of Falls Sitting-balance support: Feet supported Sitting balance-Leahy Scale: Fair Sitting balance - Comments: Able to maintain static sitting. Difficulty maintaining balance to don socks Standing balance support: Bilateral upper extremity supported,During functional activity Standing balance-Leahy Scale: Poor Standing balance comment: reliant on UE support     Special needs/care consideration Oxygen occasionally 1L in hospital, Diabetic management yes and Behavioral consideration has been getting more confused throughout admission        Previous Home Environment (from acute therapy documentation) Living Arrangements: Children Available Help at Discharge: Family,Available 24 hours/day Type of Home: House Home Layout: One level Home Access: Level entry,Stairs to enter Entrance Stairs-Number of Steps: 1 Bathroom Shower/Tub: Scientist, forensic: Standard   Discharge Living Setting Plans for Discharge Living Setting: Lives with (comment) (son, Virgel Bouquet) Type of Home at Discharge: Apartment Discharge Home Layout: One level Discharge Home Access: Level entry (curb step to side walk) Discharge Bathroom Shower/Tub: Tub/shower unit Discharge Bathroom Toilet: Standard Discharge Bathroom Accessibility: Yes How Accessible: Accessible via walker Does the patient have any problems obtaining your medications?: No   Social/Family/Support Systems Anticipated Caregiver: son, Vanessia Bokhari Anticipated Caregiver's Contact Information: [phone] Ability/Limitations of Caregiver: n/a Caregiver Availability: 24/7 Discharge Plan Discussed with Primary Caregiver: Yes Is Caregiver In Agreement with Plan?: Yes Does Caregiver/Family have Issues with Lodging/Transportation while Pt is in Rehab?: No     Goals Patient/Family Goal for Rehab: PT/OT/SLP supervision to mod I Expected length of stay: 12-16 days Additional Information: pt has been getting more confused/restless throughout her admission, ?delirium Pt/Family Agrees to Admission and willing to participate: Yes Program Orientation Provided & Reviewed with Pt/Caregiver Including Roles  & Responsibilities: Yes  Barriers to Discharge: Insurance for SNF coverage     Decrease burden of Care through IP rehab admission: n/a     Possible need for SNF placement upon discharge:Not anticipated     Patient Condition: This patient's medical and functional status has changed since the consult dated: 01/28/2021 in which the Rehabilitation Physician determined and documented that the patient's condition is appropriate for intensive rehabilitative care in an inpatient rehabilitation facility. See "History of Present Illness" (above) for medical update. Functional changes are: pt min assist with mobility and ADLs. Patient's medical and functional status update has been discussed with  the Rehabilitation physician and patient remains appropriate for inpatient rehabilitation. Will admit to inpatient rehab Sunday, 02/01/21.   Preadmission Screen Completed By:  Michel Santee, PT, DPT 01/30/2021 1:39 PM ______________________________________________________________________   Discussed status with Dr. Naaman Plummer on 01/30/21 at 1:39 PM  and received  approval for admission Sunday 02/01/21.   Admission Coordinator:  Michel Santee, PT, DPT time 1:39 PM Sudie Grumbling 01/30/21

## 2021-02-02 NOTE — Progress Notes (Signed)
Patient ID: Sue [name], female   DOB: Feb 28, 1930, 87 y.o.   MRN: [id number] Met with the patient to introduce role of the nurse CM and initiate education. Patient is confused and slightly irritated and stating she is ready to go home. Reported mixed information and no schedule. Misunderstood therapy daily 3 hours for 3 days a week therapy and thinks that she should be going home since she completed the initial eval with OT. Reviewed therapy schedule with the patient and initiated discussion regarding stroke and medications. Patient noted she was not able to follow and that her son would be able to understand better. Information left in room for son. Patient willing to participate in therapy and understands why she needs rehab. Continue to follow along to the discharge to address educational needs and secondary stroke risks including new Dx of DM with HTN, HLD and HF. Collaborate with the SW to facilitate preparation for discharge. Margarito Liner

## 2021-02-02 NOTE — Progress Notes (Signed)
Physical Medicine and Rehabilitation Consult Reason for Consult: Right side weakness and slurred speech Referring Physician: Dr. Pearlean Brownie     HPI: Sue [name] is a 87 y.o. right-handed female with history of CHF, diabetes mellitus hyperlipidemia and thyroid disease.  History taken from chart review due to [facility].  Quit smoking 62 years ago.  Per chart review she lives with her children.  1 level home one-step to entry.  Independent with rolling walker prior to admission.  Good family support.  She presented on 01/25/2021 with right hemiparesis, dysarthria of acute onset.  CT/CTA of head and neck showed unremarkable for acute process.  Multiple small lytic lesions in the calvarium.  Chronic infarct right occipital lobe.  Patient did receive tPA.  MRI showed small acute and subacute infarct involving the bilateral basal ganglia and corona radiata.  Small to moderate size infarct with petechial hemorrhage involving the right occipital parietal lobes.  Echocardiogram with ejection fraction 55 to 60%, grade 1 diastolic dysfunction.  Admission chemistries unremarkable except sodium 134, potassium 3.1, glucose 151 creatinine 1.11, WBC 12,000, hemoglobin A1c 6.9.  Currently maintained on aspirin 81 mg daily and Plavix 75 mg daily for CVA prophylaxis x3 weeks and aspirin alone.  Awaiting plan for loop recorder placement..  Therapy evaluations completed due to patient's right hemiparesis, dysarthria recommendations of physical medicine rehab consult.   Review of Systems  Unable to perform ROS: Other  Extremely HOH.       Past Medical History:  Diagnosis Date  . CHF (congestive heart failure) (HCC)    . Diabetes mellitus without complication (HCC)    . Hyperlipidemia    . Thyroid disease      Past surgical history: None on file, limited availability from patient      Family History  Problem Relation Age of Onset  . Diabetes Mother      Social History:  reports that she quit smoking about 62  years ago. Her smoking use included cigarettes. She has never used smokeless tobacco. She reports that she does not drink alcohol and does not use drugs. Allergies: No Known Allergies       Medications Prior to Admission  Medication Sig Dispense Refill  . acetaminophen (TYLENOL) 500 MG tablet Take 1,000 mg by mouth every 6 (six) hours as needed for mild pain.      Marland Kitchen buPROPion (WELLBUTRIN SR) 150 MG 12 hr tablet Take 150 mg by mouth daily.      . carvedilol (COREG) 3.125 MG tablet TAKE TWO TABLETS BY MOUTH TWICE A DAY (Patient taking differently: 3.125 mg 2 (two) times daily with a meal.) 180 tablet 0  . clonazePAM (KLONOPIN) 0.5 MG tablet Take 0.25-0.5 mg by mouth 2 (two) times daily as needed for anxiety.      . furosemide (LASIX) 40 MG tablet Take 40 mg by mouth 2 (two) times daily.      Marland Kitchen levothyroxine (SYNTHROID, LEVOTHROID) 112 MCG tablet Take 112 mcg by mouth daily before breakfast.      . nitrofurantoin, macrocrystal-monohydrate, (MACROBID) 100 MG capsule Take 100 mg by mouth once.      . potassium chloride (K-DUR,KLOR-CON) 10 MEQ tablet Take 10 mEq by mouth 2 (two) times daily.       . simvastatin (ZOCOR) 20 MG tablet Take 20 mg by mouth daily.          Home: Home Living Family/patient expects to be discharged to:: Private residence Living Arrangements:  Children Available Help at Discharge: Family,Available 24 hours/day Type of Home: House Home Access: Level entry,Stairs to enter Entrance Stairs-Number of Steps: 1 Home Layout: One level Bathroom Shower/Tub: Engineer, manufacturing systems: Standard Home Equipment: Environmental consultant - 2 wheels,Shower seat,Grab bars - tub/shower,Hand held shower head  Functional History: Prior Function Level of Independence: Independent with assistive device(s) Comments: walked with RW Functional Status:  Mobility: Bed Mobility Overal bed mobility: Needs Assistance Bed Mobility: Supine to Sit,Sit to Supine Supine to sit: Min assist,HOB elevated Sit  to supine: Min assist,HOB elevated General bed mobility comments: min assist for trunk elevation/lowering, scooting to EOB, positioning upon return to supine. Increased time, step-by-step sequencing cues Transfers Overall transfer level: Needs assistance Equipment used: Rolling walker (2 wheeled) Transfers: Sit to/from Stand Sit to Stand: Min assist Stand pivot transfers: Min assist,+2 safety/equipment General transfer comment: min assist for initial power up and steadying upon standing, STS x2 from EOB and toilet. Ambulation/Gait Ambulation/Gait assistance: Min assist Gait Distance (Feet): 80 Feet Assistive device: Rolling walker (2 wheeled) Gait Pattern/deviations: Step-through pattern,Decreased stride length,Trunk flexed,Drifts right/left General Gait Details: min assist to steady, frequent tactile cuing for posture and positioning in RW. Pt weaving L and R in hallway, and stands in the R of her RW with no awareness of this. Pt bumping into objects requring cues to avoid. Gait velocity: decr Gait velocity interpretation: <1.31 ft/sec, indicative of household ambulator   ADL: ADL Overall ADL's : Needs assistance/impaired Eating/Feeding: Set up,Sitting Eating/Feeding Details (indicate cue type and reason): Pt eating breakfast once set up in recliner. No need for assistance to open containers and able to organize plate. Grooming: Minimal assistance,Cueing for sequencing,Standing Grooming Details (indicate cue type and reason): Min Guard-Min A for safety in standing at sink. Cues for sequencing and use of soap Upper Body Bathing: Set up,Supervision/ safety,Sitting Lower Body Bathing: Moderate assistance,Sit to/from stand Upper Body Dressing : Minimal assistance,Sitting Upper Body Dressing Details (indicate cue type and reason): donned second gown Lower Body Dressing: Maximal assistance,Sit to/from stand Lower Body Dressing Details (indicate cue type and reason): Unable to bend foward to  safely don socks. Max A to don socks. Toilet Transfer: Minimal assistance,Ambulation,Regular Toilet,Grab bars Statistician Details (indicate cue type and reason): Min A for safety descent and to power up into standing Toileting- Clothing Manipulation and Hygiene: Minimal assistance,Sitting/lateral lean Toileting - Clothing Manipulation Details (indicate cue type and reason): Min A for manage gown. Pt initatially not performing hygiene and requiring Mod cues to initate. Pt presenting with poor awareness and requiring Min A to manage gown for her to reach and perform peri care Functional mobility during ADLs: Minimal assistance,Rolling walker General ADL Comments: Pt performing stand pivot to recliner and then forward mobility forward with RW. Pt presenting with decreased activity tolerance, balance, and strength   Cognition: Cognition Overall Cognitive Status: Impaired/Different from baseline Arousal/Alertness: Awake/alert Orientation Level: Oriented X4 Attention: Sustained Sustained Attention: Appears intact Memory: Impaired Memory Impairment: Storage deficit,Retrieval deficit,Decreased recall of new information Awareness: Impaired Awareness Impairment: Intellectual impairment,Emergent impairment,Anticipatory impairment Problem Solving: Impaired Problem Solving Impairment: Verbal complex Safety/Judgment: Impaired Cognition Arousal/Alertness: Awake/alert Behavior During Therapy: WFL for tasks assessed/performed Overall Cognitive Status: Impaired/Different from baseline Area of Impairment: Following commands,Problem solving,Awareness,Safety/judgement,Memory,Attention Current Attention Level: Sustained Memory: Decreased short-term memory Following Commands: Follows one step commands with increased time,Follows multi-step commands inconsistently Safety/Judgement: Decreased awareness of deficits Awareness: Emergent,Intellectual Problem Solving: Slow processing,Requires verbal  cues General Comments: Pt easily loses her train of thought, requires cues to  sequence tasks (holding urine-soiled toilet paper when standing in RW, forgot to flush toilet).   Blood pressure 123/64, pulse 72, temperature 97.9 F (36.6 C), temperature source Oral, resp. rate 18, height  (1.626 m), weight 90 kg, SpO2 96 %. Physical Exam Vitals reviewed.  Constitutional:      General: She is not in acute distress.    Appearance: She is obese.  HENT:     Head: Normocephalic and atraumatic.     Right Ear: External ear normal.     Left Ear: External ear normal.     Nose: Nose normal.  Eyes:     General:        Right eye: No discharge.        Left eye: No discharge.     Extraocular Movements: Extraocular movements intact.  Cardiovascular:     Rate and Rhythm: Normal rate and regular rhythm.  Pulmonary:     Effort: Pulmonary effort is normal. No respiratory distress.     Breath sounds: No stridor.     Comments: + [place] Abdominal:     General: Abdomen is flat. Bowel sounds are normal. There is no distension.  Musculoskeletal:     Cervical back: Normal range of motion and neck supple.     Comments: No edema or tenderness in extremities  Neurological:     Mental Status: She is alert.     Comments: Alert Makes eye contact with examiner.   Follows commands. Extremely to wait Motor: 5/5 throughout  Psychiatric:        Mood and Affect: Mood normal.        Behavior: Behavior is agitated.        Lab Results Last 24 Hours       Results for orders placed or performed during the hospital encounter of 01/25/21 (from the past 24 hour(s))  Glucose, capillary     Status: Abnormal    Collection Time: 01/27/21  6:07 AM  Result Value Ref Range    Glucose-Capillary 115 (H) 70 - 99 mg/dL    Comment 1 Notify RN      Comment 2 Document in Chart    Glucose, capillary     Status: Abnormal    Collection Time: 01/27/21 11:58 AM  Result Value Ref Range    Glucose-Capillary 130 (H) 70 - 99 mg/dL   Glucose, capillary     Status: Abnormal    Collection Time: 01/27/21  4:23 PM  Result Value Ref Range    Glucose-Capillary 117 (H) 70 - 99 mg/dL  Glucose, capillary     Status: Abnormal    Collection Time: 01/27/21  9:07 PM  Result Value Ref Range    Glucose-Capillary 124 (H) 70 - 99 mg/dL    Comment 1 Notify RN      Comment 2 Document in Chart         Imaging Results (Last 48 hours)  MR BRAIN WO CONTRAST   Result Date: 01/26/2021 CLINICAL DATA:  Code stroke follow-up EXAM: MRI HEAD WITHOUT CONTRAST TECHNIQUE: Multiplanar, multiecho pulse sequences of the brain and surrounding structures were obtained without intravenous contrast. COMPARISON:  Recent CT imaging FINDINGS: Brain: There are areas of acute and subacute infarction involving bilateral basal ganglia and corona radiata and right occipitoparietal lobes. There is evidence of petechial hemorrhage associated with the right occipitoparietal infarct. Additional patchy foci of T2 hyperintensity in the supratentorial and pontine white matter are nonspecific but probably reflect mild chronic microvascular ischemic changes. Prominence of  the ventricles and sulci reflects generalized parenchymal volume loss. There is no intracranial mass or mass effect. There is no hydrocephalus or extra-axial fluid collection. Vascular: Major vessel flow voids at the skull base are preserved. Skull and upper cervical spine: Normal marrow signal is preserved. Sinuses/Orbits: Minor mucosal thickening. No significant orbital abnormality. Other: Sella is unremarkable.  Mastoid air cells are clear. IMPRESSION: Small acute and subacute infarcts involving bilateral basal ganglia and corona radiata. Small to moderate size infarct with petechial hemorrhage involving the right occipitoparietal lobes. Mild chronic microvascular ischemic changes. Electronically Signed   By: Guadlupe Spanish M.D.   On: 01/26/2021 14:27    ECHOCARDIOGRAM LIMITED   Result Date: 01/26/2021     ECHOCARDIOGRAM LIMITED REPORT   Patient Name:   DORICE STIGGERS Date of Exam: 01/26/2021 Medical Rec #:  [id number]       Height:       64.0 in Accession #:    [id number]      Weight:       181.7 lb Date of Birth:  03-22-1930       BSA:          1.878 m Patient Age:    89 years        BP:           134/77 mmHg Patient Gender: F               HR:           67 bpm. Exam Location:  Inpatient Procedure: Limited Echo, Cardiac Doppler and Color Doppler Indications:    CVA  History:        Patient has prior history of Echocardiogram examinations, most                 recent 01/12/2021. CHF, Stroke; Risk Factors:Hypertension,                 Dyslipidemia and Diabetes.  Sonographer:    Lavenia Atlas Referring Phys: [phone] DENISE A WOLFE IMPRESSIONS  1. Left ventricular ejection fraction, by estimation, is 55 to 60%. The left ventricle has normal function. Left ventricular diastolic parameters are consistent with Grade I diastolic dysfunction (impaired relaxation).  2. Right ventricular systolic function is normal. The right ventricular size is normal. There is normal pulmonary artery systolic pressure. The estimated right ventricular systolic pressure is 30.7 mmHg.  3. The mitral valve is normal in structure. No evidence of mitral valve regurgitation. No evidence of mitral stenosis.  4. Aortic valve regurgitation is not visualized. No aortic stenosis is present.  5. The inferior vena cava is dilated in size with >50% respiratory variability, suggesting right atrial pressure of 8 mmHg. FINDINGS  Left Ventricle: Left ventricular ejection fraction, by estimation, is 55 to 60%. The left ventricle has normal function. The left ventricular internal cavity size was normal in size. There is no left ventricular hypertrophy. Left ventricular diastolic parameters are consistent with Grade I diastolic dysfunction (impaired relaxation). Right Ventricle: The right ventricular size is normal. No increase in right ventricular wall thickness.  Right ventricular systolic function is normal. There is normal pulmonary artery systolic pressure. The tricuspid regurgitant velocity is 2.38 m/s, and  with an assumed right atrial pressure of 8 mmHg, the estimated right ventricular systolic pressure is 30.7 mmHg. Left Atrium: Left atrial size was normal in size. Right Atrium: Right atrial size was normal in size. Mitral Valve: The mitral valve is normal in structure. Mild to moderate mitral annular  calcification. No evidence of mitral valve stenosis. Tricuspid Valve: The tricuspid valve is normal in structure. Tricuspid valve regurgitation is trivial. Aortic Valve: Aortic valve regurgitation is not visualized. No aortic stenosis is present. Aorta: The aortic root is normal in size and structure. Venous: The inferior vena cava is dilated in size with greater than 50% respiratory variability, suggesting right atrial pressure of 8 mmHg. LEFT VENTRICLE PLAX 2D LVIDd:         4.50 cm  Diastology LVIDs:         3.10 cm  LV e' medial:    3.70 cm/s LV PW:         1.10 cm  LV E/e' medial:  14.2 LV IVS:        1.00 cm  LV e' lateral:   4.79 cm/s LVOT diam:     2.00 cm  LV E/e' lateral: 11.0 LVOT Area:     3.14 cm  RIGHT VENTRICLE RV S prime:     5.98 cm/s LEFT ATRIUM         Index LA diam:    2.90 cm 1.54 cm/m   AORTA Ao Root diam: 2.90 cm MITRAL VALVE               TRICUSPID VALVE MV Area (PHT): 2.76 cm    TR Peak grad:   22.7 mmHg MV Decel Time: 275 msec    TR Vmax:        238.00 cm/s MV E velocity: 52.70 cm/s MV A velocity: 86.50 cm/s  SHUNTS MV E/A ratio:  0.61        Systemic Diam: 2.00 cm Marca Ancona MD Electronically signed by Marca Ancona MD Signature Date/Time: 01/26/2021/5:04:18 PM    Final        Assessment/Plan: Diagnosis: Acute and subacute infarct involving the bilateral basal ganglia and corona radiata with small to moderate size infarct involving the right occipital parietal lobes.   Stroke: Continue secondary stroke prophylaxis and Risk Factor  Modification listed below:   Antiplatelet therapy:   Blood Pressure Management:  Continue current medication with prn's with permisive HTN per primary team Statin Agent:   Diabetes management:   PT/OT for mobility, ADL training  Labs independently reviewed.  Records reviewed and summated above.   1. Does the need for close, 24 hr/day medical supervision in concert with the patient's rehab needs make it unreasonable for this patient to be served in a less intensive setting? Yes  2. Co-Morbidities requiring supervision/potential complications: CHF (Monitor in accordance with increased physical activity and avoid UE resistance excercises), DM (Monitor in accordance with exercise and adjust meds as necessary), hyperlipidemia, thyroid disease, leukocytosis (repeat labs, cont to monitor for signs and symptoms of infection, further workup if indicated).   3. Due to bowel management, safety, disease management and patient education, does the patient require 24 hr/day rehab nursing? Yes 4. Does the patient require coordinated care of a physician, rehab nurse, therapy disciplines of PT/OT/SLP to address physical and functional deficits in the context of the above medical diagnosis(es)? Yes Addressing deficits in the following areas: balance, endurance, locomotion, strength, transferring, bathing, dressing, toileting, cognition and psychosocial support 5. Can the patient actively participate in an intensive therapy program of at least 3 hrs of therapy per day at least 5 days per week? Potentially 6. The potential for patient to make measurable gains while on inpatient rehab is excellent 7. Anticipated functional outcomes upon discharge from inpatient rehab are modified independent and supervision  with PT, modified independent and supervision with OT, modified independent with SLP. 8. Estimated rehab length of stay to reach the above functional goals is: 5-7 days. 9. Anticipated discharge destination:  Home 10. Overall Rehab/Functional Prognosis: good   RECOMMENDATIONS: This patient's condition is appropriate for continued rehabilitative care in the following setting: CIR Patient has agreed to participate in recommended program. Yes Note that insurance prior authorization may be required for reimbursement for recommended care.   Comment: Rehab Admissions Coordinator to follow up.   I have personally performed a face to face diagnostic evaluation, including, but not limited to relevant history and physical exam findings, of this patient and developed relevant assessment and plan.  Additionally, I have reviewed and concur with the physician assistant's documentation above.    Maryla MorrowAnkit Patel, MD, ABPMR [place] Rossettianiel J Angiulli, PA-C 01/28/2021

## 2021-02-03 ENCOUNTER — Institutional Professional Consult (permissible substitution): Payer: HMO | Admitting: Cardiology

## 2021-02-03 LAB — GLUCOSE, CAPILLARY
Glucose-Capillary: 131 mg/dL — ABNORMAL HIGH (ref 70–99)
Glucose-Capillary: 174 mg/dL — ABNORMAL HIGH (ref 70–99)

## 2021-02-03 MED ORDER — SENNOSIDES-DOCUSATE SODIUM 8.6-50 MG PO TABS
2.0000 | ORAL_TABLET | Freq: Two times a day (BID) | ORAL | Status: DC
  Administered 2021-02-03 – 2021-02-12 (×17): 2 via ORAL
  Filled 2021-02-03 (×18): qty 2

## 2021-02-03 MED ORDER — SORBITOL 70 % SOLN
30.0000 mL | Freq: Every day | Status: DC | PRN
  Administered 2021-02-03: 30 mL via ORAL
  Filled 2021-02-03: qty 30

## 2021-02-03 NOTE — Progress Notes (Signed)
Inpatient Rehabilitation Care Coordinator Assessment and Plan Patient Details  Name: Sue [name] MRN: [id number] Date of Birth: 1930/05/06  Today's Date: 02/03/2021  Hospital Problems: Principal Problem:   Embolic stroke [facility]  Past Medical History:  Past Medical History:  Diagnosis Date  . CHF (congestive heart failure) ([place])   . Diabetes mellitus without complication ([place])   . Hyperlipidemia   . Thyroid disease    Past Surgical History:  Past Surgical History:  Procedure Laterality Date  . LOOP RECORDER INSERTION N/A 01/28/2021   Procedure: LOOP RECORDER INSERTION;  Surgeon: Constance Haw, MD;  Location: [facility];  Service: Cardiovascular;  Laterality: N/A;   Social History:  reports that she quit smoking about 62 years ago. Her smoking use included cigarettes. She has never used smokeless tobacco. She reports that she does not drink alcohol and does not use drugs.  Family / Support Systems Marital Status: Single Children: Corporate treasurer (Son) Anticipated Caregiver: Sue [name] Ability/Limitations of Caregiver: n/a Caregiver Availability: 24/7 Family Dynamics: Pt lives with son, support from children  Social History Preferred language: English Religion: Catholic Read: Yes Write: Yes Public relations account executive Issues: n/a Guardian/Conservator: Sue [name]   Abuse/Neglect Abuse/Neglect Assessment Can Be Completed: Yes Physical Abuse: Denies Verbal Abuse: Denies Sexual Abuse: Denies Exploitation of patient/patient's resources: Denies Self-Neglect: Denies  Emotional Status Pt's affect, behavior and adjustment status: pleasantly confused Recent Psychosocial Issues: n/a Psychiatric History: n/a Substance Abuse History: n/a  Patient / Family Perceptions, Expectations & Goals Pt/Family understanding of illness & functional limitations: yes Premorbid pt/family roles/activities: Some assistance from family. walking with RW Anticipated changes in  roles/activities/participation: Some able to assist Pt/family expectations/goals: Supervision to [place]: None Premorbid Home Care/DME Agencies: Other (Comment) Librarian, academic, Civil engineer, contracting, Education officer, environmental) Transportation available at discharge: Family able to transport Resource referrals recommended: Neuropsychology  Discharge Planning Living Arrangements: Children Support Systems: Children Type of Residence: Private residence (1 level home, entry level) Administrator, sports: Multimedia programmer (specify) (HTA) Financial Resources: Family Training and development officer Screen Referred: No Living Expenses: Lives with family Money Management: Family,Patient Does the patient have any problems obtaining your medications?: No Home Management: Some assisting some Patient/Family Preliminary Plans: Pt son able to assit Care Coordinator Barriers to Discharge: Insurance for SNF coverage Care Coordinator Anticipated Follow Up Needs: HH/OP Expected length of stay: 12-16 Days  Clinical Impression sw met with pt introduced self and explained role. Made attempt to call son, unable to leave VM. sw will cont to follow up  Dyanne Iha 02/03/2021, 2:50 PM

## 2021-02-03 NOTE — Progress Notes (Signed)
Occupational Therapy Session Note  Patient Details  Name: Sue [name] MRN: [id number] Date of Birth: 11/04/1929  Today's Date: 02/03/2021 OT Individual Time: 9983-3825 OT Individual Time Calculation (min): 58 min    Short Term Goals: Week 1:  OT Short Term Goal 1 (Week 1): Pt will complete UB dressing with supervision for two consecutive sessions. OT Short Term Goal 2 (Week 1): Pt will complete LB bathing sit to stand from wheelchair or shower seat with min guard assist. OT Short Term Goal 3 (Week 1): Pt will complete LB dressing sit to stand with min assist. OT Short Term Goal 4 (Week 1): Pt will complete toilet transfers with min guard assist using the RW for support.  Skilled Therapeutic Interventions/Progress Updates:    Pt received in bed and consented to OT tx. Pt refused to use her hearing amplifiers during session. Pt seen for functional transfer training, toileting, dressing, and grooming tasks this session. Pt demo's great fear of falling when getting up to go to the bathroom. Req mod A for SPT to w/c with max cuing for proper sequencing of steps and to turn all the way before sitting for each transfer. Pt req max A for LB clothing change while seated on toilet, total A for footwear. Pt demo's mild confusion during entirety of session, self-limiting behaviors, and decreased problem-solving abilities, req cuing for safety and proper completion of tasks. Pt changed top while sitting sink side with min A. During oral care, pt required cuing to wipe off mouth and turn off water. Pt instructed in STS and standing activity in RW while completing [facility] tasks. Pt able to stand for ~2 mins at a time with CGA 2/2 posterior lean and decreased standing balance. Constant cuing required for proper hand placement during transitions. After tx, pt helped back to room and left up in w/c with chair alarm on and all needs in reach.   Therapy Documentation Precautions:  Precautions Precautions:  Fall,Other (comment) Precaution Comments: pt reports 2 falls in past 1 year Restrictions Weight Bearing Restrictions: No    Pain: Pain Assessment Pain Scale: 0-10 Pain Score: 0-No pain   Therapy/Group: Individual Therapy  Camarion Weier 02/03/2021, 10:39 AM

## 2021-02-03 NOTE — IPOC Note (Signed)
Overall Plan of Care [facility]) Patient Details Name: Sue [name] MRN: [id number] DOB: [birth year]  Admitting Diagnosis: Embolic stroke [facility])  Hospital Problems: Principal Problem:   Embolic stroke [facility])     Functional Problem List: Nursing Bowel,Bladder,Medication Management,Safety,Nutrition,Endurance,Pain  PT Balance,Perception,Behavior,Safety,Edema,Sensory,Endurance,Skin Integrity,Motor,Nutrition,Pain  OT Balance,Cognition,Endurance,Motor,Safety  SLP Cognition  TR         Basic ADL's: OT Grooming,Bathing,Dressing,Toileting     Advanced  ADL's: OT Simple Meal Preparation     Transfers: PT Bed Mobility,Bed to Chair,Car,Furniture  OT Tub/Shower,Toilet     Locomotion: PT Ambulation,Wheelchair Mobility,Stairs     Additional Impairments: OT None  SLP Social Cognition   Problem Solving,Memory,Awareness,Attention  TR      Anticipated Outcomes Item Anticipated Outcome  Self Feeding independent  Swallowing      Basic self-care  supervision  Toileting  supervision   Bathroom Transfers supervision  Bowel/Bladder  manage bowel with mod I assist and bladder with min assist  Transfers  supervision using LRAD  Locomotion  CGA using LRAD  Communication     Cognition  min-supervision A  Pain  at or below level 4  Safety/Judgment  maintain safety with cues/reminders   Therapy Plan: PT Intensity: Minimum of 1-2 x/day ,45 to 90 minutes PT Frequency: 5 out of 7 days PT Duration Estimated Length of Stay: ~2 weeks OT Intensity: Minimum of 1-2 x/day, 45 to 90 minutes OT Frequency: 5 out of 7 days OT Duration/Estimated Length of Stay: 12-14 days SLP Intensity: Minumum of 1-2 x/day, 30 to 90 minutes SLP Frequency: 3 to 5 out of 7 days SLP Duration/Estimated Length of Stay: 12-14 days   Due to the current state of emergency, patients may not be receiving their 3-hours of Medicare-mandated therapy.   Team Interventions: Nursing Interventions Patient/Family  Education,Bladder Management,Bowel Management,Disease Management/Prevention,Pain Runner, broadcasting/film/video  PT interventions Ambulation/gait training,Community Teaching laboratory technician re-education,Psychosocial support,Stair training,UE/LE Strength taining/ROM,Wheelchair propulsion/positioning,Balance/vestibular training,Discharge planning,Pain management,Functional electrical stimulation,Skin care/wound management,Therapeutic Activities,UE/LE Coordination activities,Cognitive remediation/compensation,Disease management/prevention,Functional mobility training,Patient/family education,Splinting/orthotics,Therapeutic Exercise,Visual/perceptual remediation/compensation  OT Interventions Balance/vestibular training,Cognitive remediation/compensation,Community Teaching laboratory technician re-education,UE/LE Strength taining/ROM,Functional mobility training,Patient/family education,Therapeutic Exercise,UE/LE Coordination activities,Therapeutic Activities,Self Care/advanced ADL retraining,Discharge planning,Pain management,Disease mangement/prevention,Psychosocial support  SLP Interventions Cognitive remediation/compensation,Cueing hierarchy,Functional tasks,Internal/external aids,Patient/family education  TR Interventions    SW/CM Interventions Discharge Planning,Psychosocial Support,Patient/Family Education   Barriers to Discharge MD  Medical stability  Nursing Decreased caregiver support,Home environment access/layout,New diabetic 1 level home with 1 ste w son  PT Decreased caregiver support,Lack of/limited family support    OT Decreased caregiver support Will need initai 24 hr supervision  SLP      SW       Team Discharge Planning: Destination: PT-Home ,OT- Home , SLP-Home Projected Follow-up: PT-Home health PT,24 hour supervision/assistance, OT-  Home health OT,24 hour supervision/assistance,  SLP-Home Health SLP,24 hour supervision/assistance Projected Equipment Needs: PT-To be determined, OT- To be determined, SLP-None recommended by SLP Equipment Details: PT-has RW, OT-  Patient/family involved in discharge planning: PT- Patient,  OT-Patient, SLP-Patient  MD ELOS: 12-16d Medical Rehab Prognosis:  Good Assessment:  87 year old female with history of HTN, T2DM, depression who was admitted on 01/25/21 with sudden onset of right sided weakness and slurred speech. CTA head was negative for LVO and CT head without acute abnormality. She received IV tPA and follow up MRI brain done revealing small acute and subacute infarcts involving bilateral basal ganglia and corona radiata and small to moderate size infarct with petechial hemorrhage involving right occipitoparietal lobes. 2D echo showed EF 55-60% with  grade I DD. Dr. Pearlean Brownie recommended loop recorder to rule out A fib as cause of embolic stroke and this was placed on 06/25 by Dr. Elberta Fortis. She is to continue DAPT X 3 weeks followed by ASA alone for secondary stroke prevention   Now requiring 24/7 Rehab RN,MD, as well as CIR level PT, OT and SLP.  Treatment team will focus on ADLs and mobility with goals set at supervision  See Team Conference Notes for weekly updates to the plan of care

## 2021-02-03 NOTE — Progress Notes (Signed)
Speech Language Pathology Daily Session Note  Patient Details  Name: RUCHI STONEY MRN: [id number] Date of Birth: 1929/10/10  Today's Date: 02/03/2021 SLP Individual Time: 1500-1530 SLP Individual Time Calculation (min): 30 min  Short Term Goals: Week 1: SLP Short Term Goal 1 (Week 1): Pt will demonstrate sustained attention in 30 minute intervals with min A verbal cues. SLP Short Term Goal 2 (Week 1): Pt will demonstrate recall of novel and daily information with visual aid given min A verbal cues. SLP Short Term Goal 3 (Week 1): Pt will demonstrate self-monitoring and self-awareness of functional errors with mod A verbal cues.  Skilled Therapeutic Interventions:Skilled ST services focused on education. Pt was upset about not being able to reach son via phone and cell phone was inactive. SLP dialed son's number for pt on hospital room phone and he was able to sooth pt's worries. SLP communicated with pt's son via phone and provided education pertaining to current deficits and goals. Son supported managing appointments/medictaion/money with pt completing basic ADLs and noting acute confusion. SLP started memory notebook and educated pt, pt required max A verbal cues to recall today's events. Pt was left in room with call bell within reach and bed alarm set. SLP recommends to continue skilled services.     Pain Pain Assessment Pain Score: 0-No pain  Therapy/Group: Individual Therapy  Kayl Stogdill  [facility] 02/03/2021, 3:36 PM

## 2021-02-03 NOTE — Progress Notes (Signed)
PROGRESS NOTE   Subjective/Complaints:  Cathed yesterday afternoon   ROS- neg CP (some soreness at loop site) no SOB, no N/V/D  Objective:   No results found. Recent Labs    01/31/21 1008 02/02/21 0455  WBC 8.7 8.3  HGB 15.0 14.9  HCT 45.1 45.8  PLT 467* 502*   Recent Labs    01/31/21 1008 02/02/21 0455  NA 138 138  K 4.0 3.8  CL 103 103  CO2 28 28  GLUCOSE 115* 110*  BUN 7* 8  CREATININE 0.92 1.02*  CALCIUM 9.0 9.0    Intake/Output Summary (Last 24 hours) at 02/03/2021 0743 Last data filed at 02/02/2021 1700 Gross per 24 hour  Intake 118 ml  Output 500 ml  Net -382 ml        Physical Exam: Vital Signs Blood pressure 138/74, pulse 74, temperature 97.6 F (36.4 C), temperature source Oral, resp. rate 18, height 5\' 4"  (1.626 m), weight 83 kg, SpO2 95 %.  General: No acute distress Mood and affect are appropriate Heart: Regular rate and rhythm no rubs murmurs or extra sounds Lungs: Clear to auscultation, breathing unlabored, no rales or wheezes Abdomen: Positive bowel sounds, soft nontender to palpation, nondistended Extremities: No clubbing, cyanosis, or edema Skin: No evidence of breakdown, no evidence of rash Neurologic: Cranial nerves II through XII intact, motor strength is 4/5 in bilateral deltoid, bicep, tricep, grip, hip flexor, knee extensors, ankle dorsiflexor and plantar flexor  Cerebellar exam intention tremor vs mild dysmetria  finger to nose to finger as well as heel to shin in bilateral upper and lower extremities Musculoskeletal: Full range of motion in all 4 extremities. No joint swelling    Assessment/Plan: 1. Functional deficits which require 3+ hours per day of interdisciplinary therapy in a comprehensive inpatient rehab setting.  Physiatrist is providing close team supervision and 24 hour management of active medical problems listed below.  Physiatrist and rehab team continue  to assess barriers to discharge/monitor patient progress toward functional and medical goals  Care Tool:  Bathing    Body parts bathed by patient: Right arm,Left arm,Chest,Abdomen,Right upper leg,Left upper leg,Face,Left lower leg,Right lower leg,Front perineal area,Buttocks         Bathing assist Assist Level: Minimal Assistance - Patient > 75%     Upper Body Dressing/Undressing Upper body dressing   What is the patient wearing?: Pull over shirt    Upper body assist Assist Level: Minimal Assistance - Patient > 75%    Lower Body Dressing/Undressing Lower body dressing      What is the patient wearing?: Pants,Incontinence brief     Lower body assist Assist for lower body dressing: Moderate Assistance - Patient 50 - 74%     Toileting Toileting    Toileting assist Assist for toileting: Moderate Assistance - Patient 50 - 74%     Transfers Chair/bed transfer  Transfers assist     Chair/bed transfer assist level: Moderate Assistance - Patient 50 - 74% Chair/bed transfer assistive device:   Ambulation assist      Assist level: Moderate Assistance - Patient 50 - 74% Assistive device: Walker-rolling Max distance: 28ft  Walk 10 feet activity   Assist     Assist level: Moderate Assistance - Patient - 50 - 74% Assistive device: Walker-rolling   Walk 50 feet activity   Assist    Assist level: Moderate Assistance - Patient - 50 - 74% Assistive device: Walker-rolling    Walk 150 feet activity   Assist Walk 150 feet activity did not occur: Safety/medical concerns         Walk 10 feet on uneven surface  activity   Assist Walk 10 feet on uneven surfaces activity did not occur: Safety/medical concerns         Wheelchair     Assist Will patient use wheelchair at discharge?: No (TBD)             Wheelchair 50 feet with 2 turns activity    Assist            Wheelchair 150 feet activity      Assist          Blood pressure 138/74, pulse 74, temperature 97.6 F (36.4 C), temperature source Oral, resp. rate 18, height 5\' 4"  (1.626 m), weight 83 kg, SpO2 95 %.  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 cardioembolic has loop recorder  -patient may shower -ELOS/Goals: 12-16 days. Supervision to mod I goals with PT, OT, SLP 2. Antithrombotics: -DVT/anticoagulation:Pharmaceutical:Lovenox -antiplatelet therapy: DAPT X 3 weeks followed by ASA alone. 3. Pain Management:Tylenol prn. -add voltaren gel for right 1st MTP jt pain -observe for pain tolerance with WB in therapy 4. Mood:LCSW to follow for evaluation and support. -chronic depression: wellbutrin SR 150mg  daily -antipsychotic agents: N/A 5. Neuropsych: This patientis somewhatcapable of making decisions on herown behalf. 6. Skin/Wound Care:Routine pressure relief measures. 7. Fluids/Electrolytes/Nutrition:Monitor I/O. Repeat lytes nl  8. HTN: Monitor BP tid. Avoid hypoperfusion.              -resume coreg 3.125mg  BID  Vitals:   02/02/21 1947 02/03/21 0535  BP: (!) 150/90 138/74  Pulse: 75 74  Resp: 18 18  Temp: 98.4 F (36.9 C) 97.6 F (36.4 C)  SpO2: 95% 95%  controlled 5/31 9. T2DM: Hgb A1C-6.9. Monitor BS ac/hs and use SSI for elevated BS --Diet modification.   CBG (last 3)  Recent Labs    02/02/21 1634 02/02/21 2051 02/03/21 0602  GLUCAP 95 141* 131*  CBG controlled off diabetic meds will d/c CBG 10. Dyslipidemia:Continue Zocor.  11. Hgb now back to normal range although on hi side for female  78. Intermittent hypokalemia: Recheck labs in am.              -potassium supplementation 13. Hypothyroid: On synthroid 112 mcg for supplement.  14. Constipation: On Miralax-->increase  to bid              -continue senna-s qhs. 15. Chronic combined systolic/diastolic CHF:             -resuming coreg 3.125 and lasix 40mg  bid (home meds) 16. Hx of UTI? macrobid complete. Recent UA negative             -monitor for s/s   17.  Urinary retention reviewed meds no anticholinergics, has not taken prn trazodone, likely immobility and ? Diabetic cystopathy although the Hgb A1 C is fairly low LOS: 2 days A FACE TO FACE EVALUATION WAS PERFORMED  02/05/21 02/03/2021, 7:43 AM

## 2021-02-03 NOTE — Progress Notes (Signed)
Physical Therapy Session Note  Patient Details  Name: Sue [name] MRN: [id number] Date of Birth: 02-25-1930  Today's Date: 02/03/2021 PT Individual Time: 3235-5732 PT Individual Time Calculation (min): 77 min   Short Term Goals: Week 1:  PT Short Term Goal 1 (Week 1): Pt will perform supine<>sit with CGA PT Short Term Goal 2 (Week 1): Pt will perform sit<>stands using LRAD with CGA PT Short Term Goal 3 (Week 1): Pt will perform stand pivot transfers using LRAD with CGA PT Short Term Goal 4 (Week 1): Pt will ambulate at least 34ft using LRAD with CGA  Skilled Therapeutic Interventions/Progress Updates:    Pt received supine in bed stating she "had a scare" recently and "was a basket case" reporting she wasn't able to get in touch with her son and she had been concerned something bad happened to him. Pt stated she is feeling better now that she has talked with her son. Therapist provided emotional support and with minimal encouragement pt agreeable to therapy session. Therapist provided patient with lower wheelchair and cushion to promote improved seat-to-floor height for safer transfers and promote increased upright, OOB activity tolerance. Supine>sitting L EOB, HOB slightly elevated and using bedrail, with min assist for trunk upright. Pt reports need to use bathroom. Sit>stand EOB>RW with heavy mod assist for lifting to stand and holding down RW as pt demos significant posterior lean tipping AD backwards with poor ability to motor plan and weight shift anteriorly - thearpist had to facilitate forward movement of RW to improve this and transition into forward ambulation. Gait training ~91ft into bathroom using RW with min/mod assist for balance and AD management - pt continues to demo forward trunk flexed posture with poor RW management. When turning to sit on [facility] over toilet pt requires step-by-step sequencing for turning and managing AD with mod/max assist for balance due to worsening posterior  lean. Standing with mod assist for balance performed LB clothing management with mod assist - pt had been incontinent of bladder. Continent void of bowels. Standing using RW and grab bar support with min assist for balance during total assist peri-care. Gait training ~35ft back to w/c in room using RW with mod assist as described above. Pt's daughter called and inquired about pt's CLOF, anticipated LTGs, and ELOS - with pt's permission therapist updated her and discussed plan for team conference tomorrow to discuss pt's POC but that we anticipate pt will require 24hr support at home and pending progress will determine amount of hands-on assistance required for mobility and ADLs - she states her concern is that pt's son and primary caregiver will not feel comfortable assisting pt with hygiene/toileting tasks. Transported to/from gym in w/c for time management and energy conservation.   Sit>stands during session with mod progressing to min assist for balance coming to stand due to posterior lean - cued pt to perform sequential steps of "1. Scoot forward in chair 2. Place 1 hand on w/c arm rest and other on RW 3. Slide feet back underneath her 4. Lean forward." - Therapist wrote this in pt's memory notebook to improve recall/carryover of learning.   Gait training 76ft, 52ft using RW with min assist for balance and AD management - cuing to maintain proximity to RW, increase trunk/hip extension for upright posture (as pt flexes forward), and increased step length. At end of session pt transported back to room and requests to return to bed. L stand pivot w/c>EOB using RW with mod assist for balance as pt  continues to demo posterior lean, especially when having to step backwards towards bed during transfer. Sit>supine, HOB partially elevated and relying heavily on bedrail, with CGA. Pt left supine in bed with needs in reach and bed alarm on.   At end of session pt demoing some confusion asking if this was her room -  therapist reoriented pt and showed her signs in her room for sustained orientation.  Therapy Documentation Precautions:  Precautions Precautions: Fall,Other (comment) Precaution Comments: pt reports 2 falls in past 1 year Restrictions Weight Bearing Restrictions: No  Pain:   No reports of pain throughout session.  Therapy/Group: Individual Therapy  Ginny Forth , PT, DPT, CSRS  02/03/2021, 3:32 PM

## 2021-02-03 NOTE — Progress Notes (Signed)
Inpatient Rehabilitation Center Individual Statement of Services  Patient Name:  Sue [name]  Date:  02/03/2021  Welcome to the Inpatient Rehabilitation Center.  Our goal is to provide you with an individualized program based on your diagnosis and situation, designed to meet your specific needs.  With this comprehensive rehabilitation program, you will be expected to participate in at least 3 hours of rehabilitation therapies Monday-Friday, with modified therapy programming on the weekends.  Your rehabilitation program will include the following services:  Physical Therapy (PT), Occupational Therapy (OT), Speech Therapy (ST), 24 hour per day rehabilitation nursing, Therapeutic Recreaction (TR), Neuropsychology, Care Coordinator, Rehabilitation Medicine, Nutrition Services, Pharmacy Services and Other  Weekly team conferences will be held on Wednesdays to discuss your progress.  Your Inpatient Rehabilitation Care Coordinator will talk with you frequently to get your input and to update you on team discussions.  Team conferences with you and your family in attendance may also be held.  Expected length of stay: 12-16 Days  Overall anticipated outcome: Supervision to MOD I  Depending on your progress and recovery, your program may change. Your Inpatient Rehabilitation Care Coordinator will coordinate services and will keep you informed of any changes. Your Inpatient Rehabilitation Care Coordinator's name and contact numbers are listed  below.  The following services may also be recommended but are not provided by the Inpatient Rehabilitation Center:    Home Health Rehabiltiation Services  Outpatient Rehabilitation Services    Arrangements will be made to provide these services after discharge if needed.  Arrangements include referral to agencies that provide these services.  Your insurance has been verified to be:  [employer]  Your primary doctor is:  Sigmund Hazel, MD  Pertinent  information will be shared with your doctor and your insurance company.  Inpatient Rehabilitation Care Coordinator:  Lavera Guise, [place] [phone] or [phone]  Information discussed with and copy given to patient by: Andria Rhein, 02/03/2021, 11:04 AM

## 2021-02-03 NOTE — Progress Notes (Signed)
Occupational Therapy Session Note  Patient Details  Name: Sue [name] MRN: [id number] Date of Birth: 12-Apr-1930  Today's Date: 02/03/2021 OT Individual Time: 1340-1405 OT Individual Time Calculation (min): 25 min  and Today's Date: 02/03/2021 OT Missed Time: 20 Minutes Missed Time Reason: Patient unwilling/refused to participate without medical reason   Short Term Goals: Week 1:  OT Short Term Goal 1 (Week 1): Pt will complete UB dressing with supervision for two consecutive sessions. OT Short Term Goal 2 (Week 1): Pt will complete LB bathing sit to stand from wheelchair or shower seat with min guard assist. OT Short Term Goal 3 (Week 1): Pt will complete LB dressing sit to stand with min assist. OT Short Term Goal 4 (Week 1): Pt will complete toilet transfers with min guard assist using the RW for support.  Skilled Therapeutic Interventions/Progress Updates:    Pt received in bed. Pt reports she didn't want to do anything or get up because she is worried that her son isn't here. Pt reports he was supposed to be here at 1:15, but he is still not here and is worried. Attempted to reassure pt, offer in-room or in-bed exercises or to bring pt's phone with [place] during tx session but pt refused all activity. Offered to assist pt in attempting to contact son, however pt's phone had already been disabled for an hour after multiple failed login attempts. Finally, when pt asked if she needed to go to the restroom, she reported that her brief was wet. Therapist encouraged pt to sit EOB and stand in order to change brief, however pt refused. Explained to pt that the brief needed to be changed to maintain skin integrity and decrease risk of infections and sores, pt finally agreeable to change brief at bed level. Instructed in rolling, bridging, and scooting for brief change in bed, handed pt wash rag to clean peri area with encouragement to do more for herself. After brief change, pt refused any other  activity. Left in bed with alarm on and all needs met.   Therapy Documentation Precautions:  Precautions Precautions: Fall,Other (comment) Precaution Comments: pt reports 2 falls in past 1 year Restrictions Weight Bearing Restrictions: No General: General OT Amount of Missed Time: 20 Minutes   Pain:   none   Therapy/Group: Individual Therapy  Skippy Marhefka 02/03/2021, 2:08 PM

## 2021-02-04 LAB — URINALYSIS, ROUTINE W REFLEX MICROSCOPIC
Bilirubin Urine: NEGATIVE
Glucose, UA: NEGATIVE mg/dL
Ketones, ur: NEGATIVE mg/dL
Nitrite: NEGATIVE
Protein, ur: NEGATIVE mg/dL
Specific Gravity, Urine: 1.011 (ref 1.005–1.030)
WBC, UA: >50 WBC/hpf — ABNORMAL HIGH (ref 0–5)
pH: 6 (ref 5.0–8.0)

## 2021-02-04 MED ORDER — CEPHALEXIN 250 MG PO CAPS
250.0000 mg | ORAL_CAPSULE | Freq: Four times a day (QID) | ORAL | Status: DC
  Administered 2021-02-04 – 2021-02-06 (×7): 250 mg via ORAL
  Filled 2021-02-04 (×8): qty 1

## 2021-02-04 MED ORDER — QUETIAPINE FUMARATE 25 MG PO TABS
25.0000 mg | ORAL_TABLET | Freq: Every day | ORAL | Status: DC
  Administered 2021-02-04 – 2021-02-22 (×18): 25 mg via ORAL
  Filled 2021-02-04 (×18): qty 1

## 2021-02-04 NOTE — Progress Notes (Signed)
Speech Language Pathology Daily Session Note  Patient Details  Name: Sue [name] MRN: [id number] Date of Birth: April 24, 1930  Today's Date: 02/04/2021 SLP Individual Time: 4098-1191 SLP Individual Time Calculation (min): 43 min  Short Term Goals: Week 1: SLP Short Term Goal 1 (Week 1): Pt will demonstrate sustained attention in 30 minute intervals with min A verbal cues. SLP Short Term Goal 2 (Week 1): Pt will demonstrate recall of novel and daily information with visual aid given min A verbal cues. SLP Short Term Goal 3 (Week 1): Pt will demonstrate self-monitoring and self-awareness of functional errors with mod A verbal cues.  Skilled Therapeutic Interventions:Skilled ST services focused on cognitive skills. Pt expressed frsutsration and desire to go home, SLP provided education peratining to acute CVA deficits. Pt was unable to recall today's am events, pt required max A verbal cues to recall memory noetbook and required mod A to read memory notebook. SLP facilitated basic problem solving, sustained attention and recall within task utilizing coin sorting and card sorting task by color. Pt required max A verbal cues to sort coins and mod A verbal cues to sort cards by colors in a field of 6. Pt denies any difficulty and appeared distracted by wanting to call son. Pt was unable to recall how to use the call bell to ask nursing for assistance calling son and preserved on cell phone not working, but would not allow ST to put phone away in drawer. SLP dialed son's number for pt and left her talking with him on the hospital phone. Pt was left in room with call bell within reach and bed alarm set. SLP recommends to continue skilled services.     Pain Pain Assessment Pain Scale: 0-10 Pain Score: 0-No pain  Therapy/Group: Individual Therapy  Azarya Oconnell  [facility] 02/04/2021, 11:12 AM

## 2021-02-04 NOTE — Progress Notes (Signed)
Physical Therapy Session Note  Patient Details  Name: Sue [name] MRN: [id number] Date of Birth: [birth year]  Today's Date: 02/04/2021 PT Individual Time: 1324-4010 PT Individual Time Calculation (min): 18 min   and  Today's Date: 02/04/2021 PT Missed Time: 57 Minutes Missed Time Reason: Patient fatigue;Patient ill (Comment)  Short Term Goals: Week 1:  PT Short Term Goal 1 (Week 1): Pt will perform supine<>sit with CGA PT Short Term Goal 2 (Week 1): Pt will perform sit<>stands using LRAD with CGA PT Short Term Goal 3 (Week 1): Pt will perform stand pivot transfers using LRAD with CGA PT Short Term Goal 4 (Week 1): Pt will ambulate at least 37ft using LRAD with CGA  Skilled Therapeutic Interventions/Progress Updates:    Pt received sitting in w/c and upon therapist arrival pt attempting to talk into the call bell without having first pressed button for nursing staff. Pt appears confused having originally asked for her nurse Morrie Sheldon but then asked for someone named Rosey Bath - when therapist inquired about who Rosey Bath is, patient states "Don't play games." Patient reports she is not feeling well and adamantly requests to return to bed. Assessed vitals: BP 113/80 (MAP 90), HR 80bpm, SpO2 94%. Therapist attempting to encourage pt to participate in therapy session by utilizing memory notebook to help pt recall working with this therapist yesterday with pt finding some comfort in remembering this therapist but continues to decline participation at this time stating she doesn't feel well and needs to rest. Sit>stand w/c>RW with cuing to scoot forward, place feet back under her, put 1 hand on RW and 1 on w/c armrest, and lean forward to come to stand with mod assist for balance due to posterior lean. L stand pivot to EOB using RW with mod assist for balance due to continued posterior lean and pt with poor motor planning and ability to step backwards towards bed prior to sitting. Sit>supine mod assist for B LE  management into the bed. Pt left supine in bed with needs in reach, HOB elevated, and bed alarm on. Missed 57 minutes of skilled physical therapy.  Therapy Documentation Precautions:  Precautions Precautions: Fall,Other (comment) Precaution Comments: pt reports 2 falls in past 1 year Restrictions Weight Bearing Restrictions: No  Pain:   No reports of pain just states she doesn't feel well.  Therapy/Group: Individual Therapy  Ginny Forth , PT, DPT, CSRS  02/04/2021, 7:55 AM

## 2021-02-04 NOTE — Progress Notes (Signed)
Occupational Therapy Session Note  Patient Details  Name: Sue [name] MRN: [id number] Date of Birth: 1930/06/16  Today's Date: 02/04/2021 OT Individual Time: 6433-2951 OT Individual Time Calculation (min): 55 min    Short Term Goals: Week 1:  OT Short Term Goal 1 (Week 1): Pt will complete UB dressing with supervision for two consecutive sessions. OT Short Term Goal 2 (Week 1): Pt will complete LB bathing sit to stand from wheelchair or shower seat with min guard assist. OT Short Term Goal 3 (Week 1): Pt will complete LB dressing sit to stand with min assist. OT Short Term Goal 4 (Week 1): Pt will complete toilet transfers with min guard assist using the RW for support.  Skilled Therapeutic Interventions/Progress Updates:    Pt up in the wheelchair to start session sitting with hips scooted out and head in extension.  She reports needing to go to the bathroom, but also wanting to get back in bed because her neck hurts.  Educated pt to scoot back in the wheelchair and to flex her head to neutral instead of keeping it in extension.  Therapist wheeled her into the bathroom where she completed stand pivot transfer to the toilet with max assist, including clothing management.  She used the RW for standing after completion of toileting with mod assist for sit to stand and max assist for toilet hygiene and clothing management.  She transferred back to the wheelchair with the walker and mod assist where she was then rolled to the sink for washing her hands with setup.  Took her out of the room via wheelchair where she worked on [employer] and LUE coordination with use of the BITS.  She was able to complete two, 2 minute intervals in sitting of visual scanning to locate and press the blue dot.  With the RUE she was able to complete in an average of 2.9 seconds and  81% accuracy.  With the LUE he completed it in an average of 3.5 seconds with only 47% accuracy.  Increased difficulty understanding directions  secondary to decreased hearing and slower cognitive processing.  Next had her try sequencing visual scanning activity of locating letters on the screen in alphabetical order.  She needed 6 mins with an average of 14 seconds and 40% accuracy to complete the alphabet with max instructional cueing for pressing directly on the letter with the LUE.  She would press close to the letter and did not realize it was still present.  Finished session with stand pivot transfer to the bed from the wheelchair with use of the RW and mod assist.  Max instructional cueing for hand placement on the left side as well as staying inside of the RW and getting higher in the bed.  She needed mod assist for transition to supine from sitting.  Discussed with nursing her current level of confusion and assist being much greater than when this OT evaluated her on Monday.    Therapy Documentation Precautions:  Precautions Precautions: Fall,Other (comment) Precaution Comments: pt reports 2 falls in past 1 year Restrictions Weight Bearing Restrictions: No  Pain: Pain Assessment Pain Scale: Faces Pain Score: 0-No pain ADL: See Care Tool Section for some details of mobility and selfcare  Therapy/Group: Individual Therapy  Subrena Devereux OTR/L 02/04/2021, 4:00 PM

## 2021-02-04 NOTE — Progress Notes (Signed)
Occupational Therapy Session Note  Patient Details  Name: Sue [name] MRN: [id number] Date of Birth: 10/18/1929  Today's Date: 02/04/2021 OT Individual Time: 7290-2111 OT Individual Time Calculation (min): 29 min    Short Term Goals: Week 1:  OT Short Term Goal 1 (Week 1): Pt will complete UB dressing with supervision for two consecutive sessions. OT Short Term Goal 2 (Week 1): Pt will complete LB bathing sit to stand from wheelchair or shower seat with min guard assist. OT Short Term Goal 3 (Week 1): Pt will complete LB dressing sit to stand with min assist. OT Short Term Goal 4 (Week 1): Pt will complete toilet transfers with min guard assist using the RW for support.  Skilled Therapeutic Interventions/Progress Updates:    Pt received seated on BSC with NT present. Denies pain, pt perseverative on calling son. When questioned, pt is able to state she is in the hospital because of a stroke, but then relates later she is at [employer] house" and relates to son on the phone that "I'll be home soon." Total A for posterior pericare , mod A with bed rail to stand up after multiple attempts 2/2 perseveration on calling son. Donned brief + pants with max A to thread BLE and to pull over L hip, mod A for STS. Stand-pivot to w/c with mod A for balance + use of bed rail. Assisted pt calling son. Pt left seated in w/c with chair alarm engaged, call bell in reach, and all immediate needs met.    Therapy Documentation Precautions:  Precautions Precautions: Fall,Other (comment) Precaution Comments: pt reports 2 falls in past 1 year Restrictions Weight Bearing Restrictions: No Pain: no s/sx   ADL: See Care Tool for more details.  Therapy/Group: Individual Therapy  Volanda Napoleon MS, OTR/L  02/04/2021, 6:45 AM

## 2021-02-04 NOTE — Patient Care Conference (Signed)
Inpatient RehabilitationTeam Conference and Plan of Care Update Date: 02/04/2021   Time: 10:48 AM    Patient Name: Sue [name]      Medical Record Number: [id number]  Date of Birth: 1930/01/18 Sex: Female         Room/Bed: 4M01C/4M01C-01 Payor Info: Payor: Arna [employer] ADVANTAGE / Plan: [employer] HMO / Product Type: *No Product type* /    Admit Date/Time:  02/01/2021  3:56 PM  Primary Diagnosis:  Embolic stroke [facility])  Hospital Problems: Principal Problem:   Embolic stroke [facility])    Expected Discharge Date: Expected Discharge Date: 02/17/21  Team Members Present: Physician leading conference: Dr. Claudette Laws Care Coodinator Present: Chana Bode, RN, BSN, CRRN;Christina [place], BSW Nurse Present: Chana Bode, RN PT Present: Merry Lofty, PT OT Present: Perrin Maltese, OT SLP Present: Philis Pique, SLP PPS Coordinator present : Fae Pippin, SLP     Current Status/Progress Goal Weekly Team Focus  Bowel/Bladder   Pt is continent of bowel and bladder  Pt will remain continent of bowel and bladder  Will assess qshift and PRN   Swallow/Nutrition/ Hydration             ADL's   max A with LB ADLs, min A with UB ADLs. STS CGA-SUP, however requires min A during transfers for proper sequenicing, hand placement, and safety. max A toielting. pt demo's confusion, decreased problem solving, some self-limiting behaviors  SUP  ADL retraining, balance, functional transfer training, UB conditioning/strengthening, safety awareness, problem solving   Mobility   mod assist bed mobility, mod assist sit<>stand and stand pivot transfers using RW due to posterior lean, mod assist gait up to 63ft using RW - pt demos impaired motor planning and poor midline orientation and balance recovery strategies placing her at increased fall risk  CGA/supervision overall at ambulatory level  activity tolerance, pt education, transfer training, gait training, sitting and standing balance, DME  training, motor planning   Communication             Safety/Cognition/ Behavioral Observations  intermittent confusion, max-mod A  Min-Supervision A  basic problem solving, error awarenesss, recall with aid and sustained attention   Pain   Pt is pain free  Pt will remain pain free  Will assess qshift and PRN   Skin   Pt has excoriation in growing area  Excoriation will heal  Will assess qshift and PRN     Discharge Planning:  Goal to d/c supervision to MOD I, lives with son   Team Discussion: Confusion increasing and refusing therapy sessions; MD checking UA C+S. Patient on target to meet rehab goals: Currently min assist for upper body bathing and mod assist for upper body dressing. Mod assist for lower body care and transfers without a rolling walker. Mod assist for transfers with posterior lean and ambulate 80'. Discharge goals set for supervision level with CGA for ambulation.  *See Care Plan and progress notes for long and short-term goals.   Revisions to Treatment Plan:  Working on motor planning and balance recovery, problem solving, error awareness, memory and sustained attention. Teaching Needs: Transfers, toileting, medications, safety, etc.   Current Barriers to Discharge: Decreased caregiver support and Home enviroment access/layout  Possible Resolutions to Barriers: Education with son     Medical Summary Current Status: Increased confusion , urinary retention improving, incont bowel and bladder . MASD improving  Barriers to Discharge: Medical stability   Possible Resolutions to Barriers/Weekly Focus: check UA C and S, trial of quetipine for  sundowning   Continued Need for Acute Rehabilitation Level of Care: The patient requires daily medical management by a physician with specialized training in physical medicine and rehabilitation for the following reasons: Direction of a multidisciplinary physical rehabilitation program to maximize functional independence :  Yes Medical management of patient stability for increased activity during participation in an intensive rehabilitation regime.: Yes Analysis of laboratory values and/or radiology reports with any subsequent need for medication adjustment and/or medical intervention. : Yes   I attest that I was present, lead the team conference, and concur with the assessment and plan of the team.   Chana Bode B 02/04/2021, 3:04 PM

## 2021-02-04 NOTE — Progress Notes (Signed)
Patient ID: Sue [name], female   DOB: 28-Jul-1930, 87 y.o.   MRN: [id number] Team Conference Report to Patient/Family  Team Conference discussion was reviewed with the patient and caregiver, including goals, any changes in plan of care and target discharge date.  Patient and caregiver express understanding and are in agreement.  The patient has a target discharge date of 02/17/21.  Sw met with pt and called son at bedside. Provided conference updates. For son to get a better idea of LOC, family education scheduled Friday, 6/3 [place] 02/04/2021, 1:27 PM

## 2021-02-04 NOTE — Progress Notes (Signed)
PROGRESS NOTE   Subjective/Complaints: Pt became confused yesterday pm per RN, confused today , no caths needed overnite, last cath ~2d ago  Cathed yesterday afternoon   ROS- neg CP (some soreness at loop site) no SOB, no N/V/D  Objective:   No results found. Recent Labs    02/02/21 0455  WBC 8.3  HGB 14.9  HCT 45.8  PLT 502*   Recent Labs    02/02/21 0455  NA 138  K 3.8  CL 103  CO2 28  GLUCOSE 110*  BUN 8  CREATININE 1.02*  CALCIUM 9.0    Intake/Output Summary (Last 24 hours) at 02/04/2021 0829 Last data filed at 02/04/2021 0811 Gross per 24 hour  Intake 440 ml  Output --  Net 440 ml        Physical Exam: Vital Signs Blood pressure (!) 135/91, pulse 79, temperature 98.4 F (36.9 C), resp. rate 19, height 5\' 4"  (1.626 m), weight 83 kg, SpO2 93 %.  General: No acute distress Mood and affect are appropriate Heart: Regular rate and rhythm no rubs murmurs or extra sounds Lungs: Clear to auscultation, breathing unlabored, no rales or wheezes Abdomen: Positive bowel sounds, soft nontender to palpation, nondistended Extremities: No clubbing, cyanosis, or edema Skin: No evidence of breakdown, no evidence of rash  Neurologic: Cranial nerves II through XII intact, motor strength is 4/5 in bilateral deltoid, bicep, tricep, grip, hip flexor, knee extensors, ankle dorsiflexor and plantar flexor  Cerebellar exam intention tremor vs mild dysmetria  finger to nose to finger as well as heel to shin in bilateral upper and lower extremities Musculoskeletal: Full range of motion in all 4 extremities. No joint swelling    Assessment/Plan: 1. Functional deficits which require 3+ hours per day of interdisciplinary therapy in a comprehensive inpatient rehab setting.  Physiatrist is providing close team supervision and 24 hour management of active medical problems listed below.  Physiatrist and rehab team continue to  assess barriers to discharge/monitor patient progress toward functional and medical goals  Care Tool:  Bathing    Body parts bathed by patient: Right arm,Left arm,Chest,Abdomen,Right upper leg,Left upper leg,Face,Left lower leg,Right lower leg,Front perineal area,Buttocks         Bathing assist Assist Level: Minimal Assistance - Patient > 75%     Upper Body Dressing/Undressing Upper body dressing   What is the patient wearing?: Pull over shirt    Upper body assist Assist Level: Minimal Assistance - Patient > 75%    Lower Body Dressing/Undressing Lower body dressing      What is the patient wearing?: Incontinence brief     Lower body assist Assist for lower body dressing: Moderate Assistance - Patient 50 - 74%     Toileting Toileting Toileting Activity did not occur and hygiene only): Refused  Toileting assist Assist for toileting: Moderate Assistance - Patient 50 - 74%     Transfers Chair/bed transfer  Transfers assist     Chair/bed transfer assist level: Moderate Assistance - Patient 50 - 74% Chair/bed transfer assistive device: Press photographer   Ambulation assist      Assist level: Moderate Assistance - Patient 50 -  74% Assistive device: Walker-rolling Max distance: 15ft   Walk 10 feet activity   Assist     Assist level: Moderate Assistance - Patient - 50 - 74% Assistive device: Walker-rolling   Walk 50 feet activity   Assist    Assist level: Moderate Assistance - Patient - 50 - 74% Assistive device: Walker-rolling    Walk 150 feet activity   Assist Walk 150 feet activity did not occur: Safety/medical concerns         Walk 10 feet on uneven surface  activity   Assist Walk 10 feet on uneven surfaces activity did not occur: Safety/medical concerns         Wheelchair     Assist Will patient use wheelchair at discharge?: No (TBD)             Wheelchair 50 feet with 2 turns  activity    Assist            Wheelchair 150 feet activity     Assist          Blood pressure (!) 135/91, pulse 79, temperature 98.4 F (36.9 C), resp. rate 19, height 5\' 4"  (1.626 m), weight 83 kg, SpO2 93 %.  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 cardioembolic has loop recorder  -patient may shower -ELOS/Goals: 12-16 days. Supervision to mod I goals with PT, OT, SLP- team conf today  2. Antithrombotics: -DVT/anticoagulation:Pharmaceutical:Lovenox -antiplatelet therapy: DAPT X 3 weeks followed by ASA alone. 3. Pain Management:Tylenol prn. -add voltaren gel for right 1st MTP jt pain -observe for pain tolerance with WB in therapy 4. Mood:LCSW to follow for evaluation and support. -chronic depression: wellbutrin SR 150mg  daily -antipsychotic agents: N/A 5. Neuropsych: This patientis somewhatcapable of making decisions on herown behalf. 6. Skin/Wound Care:Routine pressure relief measures. 7. Fluids/Electrolytes/Nutrition:Monitor I/O. Repeat lytes nl  8. HTN: Monitor BP tid. Avoid hypoperfusion.              -resume coreg 3.125mg  BID  Vitals:   02/03/21 1925 02/04/21 0445  BP: 140/90 (!) 135/91  Pulse: 81 79  Resp: 20 19  Temp: 97.6 F (36.4 C) 98.4 F (36.9 C)  SpO2: 94% 93%  controlled 5/31 9. T2DM: Hgb A1C-6.9. Monitor BS ac/hs and use SSI for elevated BS --Diet modification.   CBG (last 3)  Recent Labs    02/02/21 2051 02/03/21 0602 02/03/21 1139  GLUCAP 141* 131* 174*  CBG controlled off diabetic meds will d/c CBG 10. Dyslipidemia:Continue Zocor.  11. Hgb now back to normal range although on hi side for female  19. Intermittent hypokalemia: Recheck labs in am.              -potassium supplementation 13. Hypothyroid: On  synthroid 112 mcg for supplement.  14. Constipation: On Miralax-->increase to bid              -continue senna-s qhs. 15. Chronic combined systolic/diastolic CHF:             -resuming coreg 3.125 and lasix 40mg  bid (home meds) 16. Hx of UTI? macrobid complete. Recent UA negative             -monitor for s/s   17.  Urinary retention reviewed meds no anticholinergics, has not taken prn trazodone, likely immobility and ? Diabetic cystopathy although the Hgb A1 C is fairly low- this has improved , will check UA C and S due to some increase  confusion  LOS: 3 days A FACE TO FACE EVALUATION WAS PERFORMED  Erick Colace 02/04/2021, 8:29 AM

## 2021-02-04 NOTE — Progress Notes (Signed)
Occupational Therapy Session Note  Patient Details  Name: Sue [name] MRN: [id number] Date of Birth: 1930-01-20  Today's Date: 02/04/2021 OT Individual Time: 1110-1145 OT Individual Time Calculation (min): 35 min    Short Term Goals: Week 1:  OT Short Term Goal 1 (Week 1): Pt will complete UB dressing with supervision for two consecutive sessions. OT Short Term Goal 2 (Week 1): Pt will complete LB bathing sit to stand from wheelchair or shower seat with min guard assist. OT Short Term Goal 3 (Week 1): Pt will complete LB dressing sit to stand with min assist. OT Short Term Goal 4 (Week 1): Pt will complete toilet transfers with min guard assist using the RW for support.  Skilled Therapeutic Interventions/Progress Updates:    Pt seen for therapy make-up time. Pt received in room and only agreeable to in-room tx this session with mod encouragement for participation. Treatment session was focused on increasing UE strength needed for ADLs. Pt was Min A for lying > sitting EOB transfer to scoot to EOB. Pt was Max A for sit > stand transfer to RW and required max cuing for proper body mechanics with poor carryover. Pt had decreased coordination and very ataxic movements when standing. Pt required max A and max cuing to stand pivot to wheelchair and requires therapist assist to pivot all the way to w/c before sitting down. OT educated pt on safety during transfers. Pt instructed and trained in BUE strengthening HEP while sitting in w/c. Pt completed 3x10 sets of 2lb chest press and shoulder flexion overhead. Pt completed 3x10 sets of 3lb dumbbell bicep curls, 1 chest press and 2 handed shoulder flexion overhead. Throughout the strengthening, pt would hyperextend her neck to compensate for UE weakness. OT educated on proper form with poor carryover. Pt complained of neck pain after completing UE strengthening. Pt instructed in neck circles and neck flexions stretches to decrease neck pain with pt  reporting no relief. Pt then reported "okay that is all I can do, I'm done," refused any further tx. Left pt sitting in recliner, seat alarm on and all needs met.   Therapy Documentation Precautions:  Precautions Precautions: Fall,Other (comment) Precaution Comments: pt reports 2 falls in past 1 year Restrictions Weight Bearing Restrictions: No   Pain: Pain Assessment Pain Score: 0-No pain   Therapy/Group: Individual Therapy  Lanae Federer 02/04/2021, 12:32 PM

## 2021-02-05 NOTE — Progress Notes (Signed)
PROGRESS NOTE   Subjective/Complaints:  No issues overnite, pt would like bed rail down an dto turn off call bell system, discussed that I would not do that due to safety concerns Pt oriented to person place and CVA   ROS- neg CP (some soreness at loop site) no SOB, no N/V/D  Objective:   No results found. No results for input(s): WBC, HGB, HCT, PLT in the last 72 hours. No results for input(s): NA, K, CL, CO2, GLUCOSE, BUN, CREATININE, CALCIUM in the last 72 hours.  Intake/Output Summary (Last 24 hours) at 02/05/2021 0718 Last data filed at 02/04/2021 1800 Gross per 24 hour  Intake 720 ml  Output 45 ml  Net 675 ml        Physical Exam: Vital Signs Blood pressure 137/66, pulse 73, temperature 98.2 F (36.8 C), temperature source Oral, resp. rate 17, height 5\' 4"  (1.626 m), weight 83 kg, SpO2 90 %.  General: No acute distress Mood and affect are appropriate Heart: Regular rate and rhythm no rubs murmurs or extra sounds Lungs: Clear to auscultation, breathing unlabored, no rales or wheezes Abdomen: Positive bowel sounds, soft nontender to palpation, nondistended Extremities: No clubbing, cyanosis, or edema Skin: No evidence of breakdown, no evidence of rash  Neurologic: Cranial nerves II through XII intact, motor strength is 4/5 in bilateral deltoid, bicep, tricep, grip, hip flexor, knee extensors, ankle dorsiflexor and plantar flexor  Cerebellar exam intention tremor vs mild dysmetria  finger to nose to finger as well as heel to shin in bilateral upper and lower extremities Musculoskeletal: Full range of motion in all 4 extremities. No joint swelling    Assessment/Plan: 1. Functional deficits which require 3+ hours per day of interdisciplinary therapy in a comprehensive inpatient rehab setting.  Physiatrist is providing close team supervision and 24 hour management of active medical problems listed  below.  Physiatrist and rehab team continue to assess barriers to discharge/monitor patient progress toward functional and medical goals  Care Tool:  Bathing    Body parts bathed by patient: Right arm,Left arm,Chest,Abdomen,Right upper leg,Left upper leg,Face,Left lower leg,Right lower leg,Front perineal area,Buttocks         Bathing assist Assist Level: Minimal Assistance - Patient > 75%     Upper Body Dressing/Undressing Upper body dressing   What is the patient wearing?: Pull over shirt    Upper body assist Assist Level: Minimal Assistance - Patient > 75%    Lower Body Dressing/Undressing Lower body dressing      What is the patient wearing?: Incontinence brief     Lower body assist Assist for lower body dressing: Moderate Assistance - Patient 50 - 74%     Toileting Toileting Toileting Activity did not occur and hygiene only): Refused  Toileting assist Assist for toileting: Moderate Assistance - Patient 50 - 74%     Transfers Chair/bed transfer  Transfers assist     Chair/bed transfer assist level: Moderate Assistance - Patient 50 - 74% Chair/bed transfer assistive device: Walker,Armrests   Locomotion Ambulation   Ambulation assist      Assist level: Moderate Assistance - Patient 50 - 74% Assistive device: Walker-rolling Max distance: 24ft  Walk 10 feet activity   Assist     Assist level: Moderate Assistance - Patient - 50 - 74% Assistive device: Walker-rolling   Walk 50 feet activity   Assist    Assist level: Moderate Assistance - Patient - 50 - 74% Assistive device: Walker-rolling    Walk 150 feet activity   Assist Walk 150 feet activity did not occur: Safety/medical concerns         Walk 10 feet on uneven surface  activity   Assist Walk 10 feet on uneven surfaces activity did not occur: Safety/medical concerns         Wheelchair     Assist Will patient use wheelchair at discharge?: No  (TBD)             Wheelchair 50 feet with 2 turns activity    Assist            Wheelchair 150 feet activity     Assist          Blood pressure 137/66, pulse 73, temperature 98.2 F (36.8 C), temperature source Oral, resp. rate 17, height 5\' 4"  (1.626 m), weight 83 kg, SpO2 90 %.  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 cardioembolic has loop recorder  -patient may shower -ELOS/Goals: 6/14. Supervision 2. Antithrombotics: -DVT/anticoagulation:Pharmaceutical:Lovenox -antiplatelet therapy: DAPT X 3 weeks followed by ASA alone. 3. Pain Management:Tylenol prn. -add voltaren gel for right 1st MTP jt pain -observe for pain tolerance with WB in therapy 4. Mood:LCSW to follow for evaluation and support. -chronic depression: wellbutrin SR 150mg  daily -antipsychotic agents: N/A 5. Neuropsych: This patientis somewhatcapable of making decisions on herown behalf. 6. Skin/Wound Care:Routine pressure relief measures. 7. Fluids/Electrolytes/Nutrition:Monitor I/O. Repeat lytes nl  8. HTN: Monitor BP tid. Avoid hypoperfusion.              -resume coreg 3.125mg  BID  Vitals:   02/04/21 1915 02/05/21 0533  BP: 121/70 137/66  Pulse: 74 73  Resp: 18 17  Temp: 97.7 F (36.5 C) 98.2 F (36.8 C)  SpO2: 93% 90%  controlled 6/2 9. T2DM: Hgb A1C-6.9. Monitor BS ac/hs and use SSI for elevated BS --Diet modification.    10. Dyslipidemia:Continue Zocor.  11. Hgb now back to normal range although on hi side for female  62. Intermittent hypokalemia: Recheck labs in am.              -potassium supplementation 13. Hypothyroid: On synthroid 112 mcg for supplement.  14. Constipation: On Miralax-->increase to bid              -continue senna-s qhs. 15. Chronic  combined systolic/diastolic CHF:             -resuming coreg 3.125 and lasix 40mg  bid (home meds)   16.  Urinary retention reviewed meds no anticholinergics, has not taken prn trazodone, likely immobility and ? Diabetic cystopathy although the Hgb A1 C is fairly low- this has improved UA + now on Keflex monitor UCx for result LOS: 4 days A FACE TO FACE EVALUATION WAS PERFORMED  8/2 02/05/2021, 7:18 AM

## 2021-02-05 NOTE — Progress Notes (Signed)
Physical Therapy Session Note  Patient Details  Name: Sue [name] MRN: [id number] Date of Birth: 04-14-1930  Today's Date: 02/05/2021 PT Individual Time: 9678-9381  1711-1744 PT Individual Time Calculation (min): 53 min and 33 min    Short Term Goals: Week 1:  PT Short Term Goal 1 (Week 1): Pt will perform supine<>sit with CGA PT Short Term Goal 2 (Week 1): Pt will perform sit<>stands using LRAD with CGA PT Short Term Goal 3 (Week 1): Pt will perform stand pivot transfers using LRAD with CGA PT Short Term Goal 4 (Week 1): Pt will ambulate at least 57ft using LRAD with CGA  Skilled Therapeutic Interventions/Progress Updates:    Session 1: Pt received supine in bed and reports need to use bathroom. Agreeable for participation in therapy to complete this task. Pt continues to be confused with disorientation to location and situation she also does not recall this therapist despite having worked with this therapist for 3 consecutive days. Pt also demonstrates poor frustration tolerance with pt poorly tolerating cuing or facilitation to improve mobility and balance safety - this improves throughout the session as therapist uses calm, soothing voice and gently explains to patient what she was doing. Pt demonstrates significantly impaired motor planning and problem solving this session when doffing shirt, donning shirt, and to complete sit>supine.   Supine>sitting L EOB, HOB partially elevated and using bedrail, CGA - then requires assist via bed pad to scoot hips forward to EOB as pt unsuccessful at completing this despite attempts. Attempted sit>stand EOB>RW with pt continuing to demo heavy posterior lean with inability to correct despite cuing/facilitation - requires heavy mod assist for balance and therapist having to hold down RW to prevent pt from pulling on it causing it to flip backwards. Pt became frustrated with therapist's encouragement to attempt standing a second time but after seated rest  break became agreeable - came to stand that time again requiring mod assist but was able to anteriorly weight shift as therapist moved RW forward to facilitate it. Gait training ~75ft x2 to/from bathroom using RW with min/mod assist for balance and AD management - pt maintains forward flexed posture with varying R/L lateral leans and shorts, shuffled steps with very poor balance. Requires max cuing to turn to sit on [facility] over toilet with therapist cuing for pt to use grab bar support for increased safety. Continent of bladder - total assist peri-care. Pt requests to go back to bed.  Therapist able to redirect pt into sitting EOB working on unsupported sitting balance and upright tolerance while therapist assisted with grooming task of hair brushing. During this, terapist encouraging pt to participate in short distance ambulation, but pt continues to defer and requests to lay down. Sit>supine min assist for B LE management into bed. Pt left supine in bed with needs in reach, HOB elevated, and bed alarm on.   Session 2: Pt received supine in bed and upon therapist arrival pt oriented to the fact that she is supposed to have therapy at this time and currently has a UTI. However, pt does not recognize this therapist. Pt agreeable to session. Supine>sitting L EOB, HOB partially elevated and using bedrail with supervision, pt able to scoot hips towards EOB at this time without assistance (as was needed this AM) with pt demoing improved motor planning to complete this. Short distance ~55ft ambulatory stand pivot transfer using RW with heavy mod assist for balance as pt continues to demo strong posterior lean - limited ability to  correct despite thearpist facilitating forward movement of AD and cuing pt - requried max assist to maintain balance while stepping backwards towards chair to prevent LOB. Transported to/from gym in w/c for time management and energy conservation. Gait training 181ft, 35ft using RW with min/mod  assist for balance and AD management - pt continues to demo L lateral trunk lean with hips shifted towards the R, poor B LE foot clearance, LLE excessively adducted towards midline, and excessive trunk/hip flexion, and poor AD management pushing it too far forward - cuing for correction throughout with pt having difficulty understanding the cues. Repeated sit<>stands w/c<>RW x5 with min/mod assist for balance due to posterior lean - cuing for carryover of education on the sequencing of transfers as written in PT note on 5/31. Transported back to room in w/c. Pt left seated in w/c with needs in reach, chair alarm on, meal tray set-up, and pt's daughter calling. Pt confused when therapist saying bye as pt not oriented to the fact that she is in her hospital room and will be staying here tonight - therapist reoriented her.  Therapy Documentation Precautions:  Precautions Precautions: Fall,Other (comment) Precaution Comments: pt reports 2 falls in past 1 year Restrictions Weight Bearing Restrictions: No  Pain:   Session 1: No reports of pain throughout session.   Session 2: No reports of pain throughout session.   Therapy/Group: Individual Therapy  Ginny Forth , PT, DPT, CSRS  02/05/2021, 7:55 AM

## 2021-02-05 NOTE — Progress Notes (Signed)
Speech Language Pathology Daily Session Note  Patient Details  Name: Sue [name] MRN: [id number] Date of Birth: Jul 26, 1930  Today's Date: 02/05/2021 SLP Individual Time: 0175-1025 SLP Individual Time Calculation (min): 40 min  Short Term Goals: Week 1: SLP Short Term Goal 1 (Week 1): Pt will demonstrate sustained attention in 30 minute intervals with min A verbal cues. SLP Short Term Goal 2 (Week 1): Pt will demonstrate recall of novel and daily information with visual aid given min A verbal cues. SLP Short Term Goal 3 (Week 1): Pt will demonstrate self-monitoring and self-awareness of functional errors with mod A verbal cues.  Skilled Therapeutic Interventions:Skilled ST services focused on cognitive skills. Pt was confused and agitated this am. Pt refused to participate in therapy presented at  Many levels, 3 picture card task, reading memory notebook and conversation to ease frustration. Pt reported she wants to go home and is demonstrating poor intellectual awareness, impact of deficits. Pt is able to recall acute CVA but expressed no difficulty with mobility or thinking skills. SLP provided calendar to aid in orientation and attempted to call son to increase participation, however there was no answer. SLP provided education pertaining to acute deficits and encouragement to participate in upcoming therapy sessions. SLP communicated with nurse to continue attempting to call pt's son for family support.Pt missed 15 minutes of treatment due to refusal. Pt was left in room with call bell within reach and bed alarm set. SLP recommends to continue skilled services.     Pain Pain Assessment Pain Score: 0-No pain  Therapy/Group: Individual Therapy  Laverda Stribling  [facility] 02/05/2021, 8:15 AM

## 2021-02-05 NOTE — Progress Notes (Signed)
Occupational Therapy Session Note  Patient Details  Name: Sue [name] MRN: [id number] Date of Birth: 01-Jun-1930  Today's Date: 02/05/2021 OT Individual Time: 4098-1191 OT Individual Time Calculation (min): 42 min    Short Term Goals: Week 1:  OT Short Term Goal 1 (Week 1): Pt will complete UB dressing with supervision for two consecutive sessions. OT Short Term Goal 2 (Week 1): Pt will complete LB bathing sit to stand from wheelchair or shower seat with min guard assist. OT Short Term Goal 3 (Week 1): Pt will complete LB dressing sit to stand with min assist. OT Short Term Goal 4 (Week 1): Pt will complete toilet transfers with min guard assist using the RW for support.  Skilled Therapeutic Interventions/Progress Updates:   Pt in bed to start session, confused not oriented to place or time.  She was initially refusing to get up at all but therapist finally persuaded her to sit up on the EOB.  She was able to complete with mod assist including scooting out to the EOB.  She voiced the need to go to the bathroom, so RW was used for functional mobility to the toilet with rails at max assist level.  Pt with increased posterior lean and LOB with therapist having to provide constant support.  Pt with decreased emergent awareness of balance accusing therapist of pulling her backwards.  Once at the toilet, she needed max assist for clothing management and toilet hygiene.  Pt reluctant to complete toileting with therapist in the room, but he emphasized that he could not leave her secondary to safety issues.  She needed multiple attempts for sit to stand and standing to pull up brief and pants secondary to posterior lean and LOB.  Decreased hearing noted throughout session with therapist having to repeat instructions frequently and at times pt stating "I can't hear you, your talking too loud.  Attempted to use amplifier as well but she stated it was too loud when turned on level two.  Finished session with  mod assist transfer to the wheelchair and pt being pushed over to the sink to wash her hands with setup.  She then transferred back to the bed a max assist stand pivot with the RW secondary to not being able to motor plan which way to turn the walker and for staying inside the walker.  She transitioned back to supine with min assist.  Pt then with requests to go back to the bed even though she was already in the bed.  She would state "take me back to my room".  Therapist re-directed her that she was in her hospital room to which she denied.  She requested to go back home to which therapist educated her that she would have to stay to continue therapy in order to gain her independence.  Pt unable to understand or reason.  Memory notebook filled out with the call button and phone in reach and safety alarm in place.     Therapy Documentation Precautions:  Precautions Precautions: Fall,Other (comment) Precaution Comments: pt reports 2 falls in past 1 year Restrictions Weight Bearing Restrictions: No  Pain: Pain Assessment Pain Scale: Faces Faces Pain Scale: No hurt ADL: See Care Tool Section for some details of mobility and selfcare  Therapy/Group: Individual Therapy  Blessen Kimbrough OTR/L 02/05/2021, 4:28 PM

## 2021-02-06 ENCOUNTER — Ambulatory Visit: Payer: HMO | Admitting: Physician Assistant

## 2021-02-06 LAB — URINE CULTURE: Culture: >=100000 — AB

## 2021-02-06 MED ORDER — NITROFURANTOIN MONOHYD MACRO 100 MG PO CAPS
100.0000 mg | ORAL_CAPSULE | Freq: Two times a day (BID) | ORAL | Status: AC
  Administered 2021-02-06 – 2021-02-10 (×10): 100 mg via ORAL
  Filled 2021-02-06 (×10): qty 1

## 2021-02-06 NOTE — Progress Notes (Signed)
   ILR wound check  Device site stable and steri-strip removed. No episodes. Monitor transmitting nightly as below.

## 2021-02-06 NOTE — Progress Notes (Signed)
Speech Language Pathology Daily Session Note  Patient Details  Name: Sue [name] MRN: [id number] Date of Birth: [birth year]  Today's Date: 02/06/2021 SLP Individual Time: 1350-1415 SLP Individual Time Calculation (min): 25 min  Short Term Goals: Week 1: SLP Short Term Goal 1 (Week 1): Pt will demonstrate sustained attention in 30 minute intervals with min A verbal cues. SLP Short Term Goal 2 (Week 1): Pt will demonstrate recall of novel and daily information with visual aid given min A verbal cues. SLP Short Term Goal 3 (Week 1): Pt will demonstrate self-monitoring and self-awareness of functional errors with mod A verbal cues.  Skilled Therapeutic Interventions:   Patient seen with son present for skilled ST session focusing on family education and discussion of patient's overall progress. Patient reported to SLP and son that she feels pressure on her eardrums and feeling of sounds "moving around" in her head when she is talking or when other people are talking. SLP was questioning whether patient is having some sinus congestion/pressure or something (fluid, earwax, etc) clogging her ears. Patient seemed to report that she has had some hearing difficulties prior to current admission, however son reports that he had not noticed a significant difficulty until after CVA. Patient current has a UTI and so SLP discussed with son impact of UTI on cognition in elderly population as well as hearing loss impacting perception of a person's cognitive function. SLP did emphasize that regardless of improvement from UTI, currently, therapy team is recommending 24 hour supervision. Son reported understanding and both he and SLP in agreement that we will continue to monitor patient for progress and determine level of assistance needed when closer to discharge.  Pain Pain Assessment Pain Scale: Faces Pain Score: 3  Faces Pain Scale: No hurt Pain Type: Acute pain Pain Location: Neck Pain Descriptors /  Indicators: Aching Pain Frequency: Intermittent Pain Onset: On-going Pain Intervention(s): Medication (See eMAR)  Therapy/Group: Individual Therapy  Angela Nevin, MA, CCC-SLP Speech Therapy

## 2021-02-06 NOTE — Progress Notes (Signed)
Physical Therapy Session Note  Patient Details  Name: Sue [name] MRN: [id number] Date of Birth: 09/11/1929  Today's Date: 02/06/2021 PT Individual Time: 1100-1128 PT Individual Time Calculation (min): 28 min   Short Term Goals: Week 1:  PT Short Term Goal 1 (Week 1): Pt will perform supine<>sit with CGA PT Short Term Goal 2 (Week 1): Pt will perform sit<>stands using LRAD with CGA PT Short Term Goal 3 (Week 1): Pt will perform stand pivot transfers using LRAD with CGA PT Short Term Goal 4 (Week 1): Pt will ambulate at least 42ft using LRAD with CGA  Skilled Therapeutic Interventions/Progress Updates:    Patient received sitting up in wc, not oriented to time, place or situation. PT attempting to assist in reorientation, however patients attention was not able to be successfully drawn to orientation clues, such as signs in her room. She did deny pain and requested to use the bathroom. Patient transferring to [facility] over toilet with ModA stand pivot due to posterior lean. 2nd assist needed for clothing management due to posterior lean. Patient unable to void, though she said that she felt as though she needed to. After some time trying, patient transferring back to wc via stand pivot with ModA. PT transporting patient in wc to therapy gym for time management and energy conservation. Sit <> stand x5 with RW and Max verbal cues for correct hand placement. Mirror used for visual feedback on posture. Patient with noted no carryover from each trial on where to properly place hands and correct postural alignment as she would maintain posterior lean despite multimodal cues. Patient returning to room in wc, seatbelt alarm on, call light within reach.   Therapy Documentation Precautions:  Precautions Precautions: Fall,Other (comment) Precaution Comments: pt reports 2 falls in past 1 year Restrictions Weight Bearing Restrictions: No    Therapy/Group: Individual Therapy  Elizebeth Koller, PT, DPT, CBIS  02/06/2021, 7:38 AM

## 2021-02-06 NOTE — Progress Notes (Signed)
Occupational Therapy Session Note  Patient Details  Name: Sue [name] MRN: [id number] Date of Birth: 1930/07/18  Today's Date: 02/06/2021 OT Individual Time: 0900-0930 OT Individual Time Calculation (min): 30 min   Short Term Goals: Week 1:  OT Short Term Goal 1 (Week 1): Pt will complete UB dressing with supervision for two consecutive sessions. OT Short Term Goal 2 (Week 1): Pt will complete LB bathing sit to stand from wheelchair or shower seat with min guard assist. OT Short Term Goal 3 (Week 1): Pt will complete LB dressing sit to stand with min assist. OT Short Term Goal 4 (Week 1): Pt will complete toilet transfers with min guard assist using the RW for support.  Skilled Therapeutic Interventions/Progress Updates:    Pt greeted seated EOB, handoff to OT from PT session. Pt completed stand-pivot w. RW and mod A over to wc. Pt brought to the sink for grooming tasks. Pt needed min verbal cues and set-up A, but overall did well sequecing and completing task. Pt agreeable to change her shirt and needed min A to doff shirt as pt trying to pull down pants instead of taking off shirt. MiN A to thread new shirt and pull overhead. OT assisted with hair care with braiding hair. Pt brought to therapy gym and worked on visual scanning in all 4 quadrants and finger isolation with BITS activity. Pt returned to room at end of session and left seated in wc with alarm belt on, call bell in reach and needs met.   Therapy Documentation Precautions:  Precautions Precautions: Fall,Other (comment) Precaution Comments: pt reports 2 falls in past 1 year Restrictions Weight Bearing Restrictions: No Pain:  denies pain Therapy/Group: Individual Therapy  Valma Cava 02/06/2021, 9:39 AM

## 2021-02-06 NOTE — Progress Notes (Signed)
PROGRESS NOTE   Subjective/Complaints:  No issues overnight   ROS- neg CP (some soreness at loop site) no SOB, no N/V/D  Objective:   No results found. No results for input(s): WBC, HGB, HCT, PLT in the last 72 hours. No results for input(s): NA, K, CL, CO2, GLUCOSE, BUN, CREATININE, CALCIUM in the last 72 hours.  Intake/Output Summary (Last 24 hours) at 02/06/2021 0713 Last data filed at 02/05/2021 1805 Gross per 24 hour  Intake 823 ml  Output --  Net 823 ml        Physical Exam: Vital Signs Blood pressure 124/61, pulse 69, temperature 98 F (36.7 C), resp. rate 18, height 5\' 4"  (1.626 m), weight 83 kg, SpO2 91 %.   General: No acute distress Mood and affect are appropriate Heart: Regular rate and rhythm no rubs murmurs or extra sounds Lungs: Clear to auscultation, breathing unlabored, no rales or wheezes Abdomen: Positive bowel sounds, soft nontender to palpation, nondistended Extremities: No clubbing, cyanosis, or edema Skin: No evidence of breakdown, no evidence of rash   Neurologic: Cranial nerves II through XII intact, motor strength is 4/5 in bilateral deltoid, bicep, tricep, grip, hip flexor, knee extensors, ankle dorsiflexor and plantar flexor  Cerebellar exam intention tremor vs mild dysmetria  finger to nose to finger as well as heel to shin in bilateral upper and lower extremities Musculoskeletal: Full range of motion in all 4 extremities. No joint swelling    Assessment/Plan: 1. Functional deficits which require 3+ hours per day of interdisciplinary therapy in a comprehensive inpatient rehab setting.  Physiatrist is providing close team supervision and 24 hour management of active medical problems listed below.  Physiatrist and rehab team continue to assess barriers to discharge/monitor patient progress toward functional and medical goals  Care Tool:  Bathing    Body parts bathed by patient:  Right arm,Left arm,Chest,Abdomen,Right upper leg,Left upper leg,Face,Left lower leg,Right lower leg,Front perineal area,Buttocks         Bathing assist Assist Level: Minimal Assistance - Patient > 75%     Upper Body Dressing/Undressing Upper body dressing   What is the patient wearing?: Pull over shirt    Upper body assist Assist Level: Minimal Assistance - Patient > 75%    Lower Body Dressing/Undressing Lower body dressing      What is the patient wearing?: Incontinence brief     Lower body assist Assist for lower body dressing: Moderate Assistance - Patient 50 - 74%     Toileting Toileting Toileting Activity did not occur and hygiene only): Refused  Toileting assist Assist for toileting: Maximal Assistance - Patient 25 - 49%     Transfers Chair/bed transfer  Transfers assist     Chair/bed transfer assist level: Moderate Assistance - Patient 50 - 74% Chair/bed transfer assistive device: Press photographer   Ambulation assist      Assist level: Moderate Assistance - Patient 50 - 74% Assistive device: Walker-rolling Max distance: 122ft   Walk 10 feet activity   Assist     Assist level: Moderate Assistance - Patient - 50 - 74% Assistive device: Walker-rolling   Walk 50 feet activity  Assist    Assist level: Moderate Assistance - Patient - 50 - 74% Assistive device: Walker-rolling    Walk 150 feet activity   Assist Walk 150 feet activity did not occur: Safety/medical concerns         Walk 10 feet on uneven surface  activity   Assist Walk 10 feet on uneven surfaces activity did not occur: Safety/medical concerns         Wheelchair     Assist Will patient use wheelchair at discharge?: No (TBD)             Wheelchair 50 feet with 2 turns activity    Assist            Wheelchair 150 feet activity     Assist          Blood pressure 124/61, pulse 69, temperature 98 F  (36.7 C), resp. rate 18, height 5\' 4"  (1.626 m), weight 83 kg, SpO2 91 %.  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 cardioembolic has loop recorder  -patient may shower -ELOS/Goals: 6/14. Supervision 2. Antithrombotics: -DVT/anticoagulation:Pharmaceutical:Lovenox -antiplatelet therapy: DAPT X 3 weeks followed by ASA alone. 3. Pain Management:Tylenol prn. -add voltaren gel for right 1st MTP jt pain -observe for pain tolerance with WB in therapy 4. Mood:LCSW to follow for evaluation and support. -chronic depression: wellbutrin SR 150mg  daily -antipsychotic agents: N/A 5. Neuropsych: This patientis somewhatcapable of making decisions on herown behalf. 6. Skin/Wound Care:Routine pressure relief measures. 7. Fluids/Electrolytes/Nutrition:Monitor I/O. Repeat lytes nl  8. HTN: Monitor BP tid. Avoid hypoperfusion.              -resume coreg 3.125mg  BID  Vitals:   02/05/21 1928 02/06/21 0518  BP: (!) 153/87 124/61  Pulse: 75 69  Resp: 18 18  Temp: 97.9 F (36.6 C) 98 F (36.7 C)  SpO2: 92% 91%  controlled 6/3 9. T2DM: Hgb A1C-6.9. Monitor BS ac/hs and use SSI for elevated BS --Diet modification.    10. Dyslipidemia:Continue Zocor.  11. Hgb now back to normal range although on hi side for female  73. Intermittent hypokalemia: Recheck labs in am.              -potassium supplementation 13. Hypothyroid: On synthroid 112 mcg for supplement.  14. Constipation: On Miralax-->increase to bid              -continue senna-s qhs. 15. Chronic combined systolic/diastolic CHF:             -resuming coreg 3.125 and lasix 40mg  bid (home meds)   16.  Urinary retention reviewed meds no anticholinergics, has not taken prn trazodone, likely immobility and ? Diabetic cystopathy  although the Hgb A1 C is fairly low- this has improved UA + now on Keflex monitor UCx for result- >100K GNR LOS: 5 days A FACE TO FACE EVALUATION WAS PERFORMED  04/08/21 02/06/2021, 7:13 AM

## 2021-02-06 NOTE — Progress Notes (Signed)
Occupational Therapy Session Note  Patient Details  Name: Sue [name] MRN: [id number] Date of Birth: 06-Feb-1930  Today's Date: 02/06/2021 OT Individual Time: 2330-0762 OT Individual Time Calculation (min): 44 min    Short Term Goals: Week 1:  OT Short Term Goal 1 (Week 1): Pt will complete UB dressing with supervision for two consecutive sessions. OT Short Term Goal 2 (Week 1): Pt will complete LB bathing sit to stand from wheelchair or shower seat with min guard assist. OT Short Term Goal 3 (Week 1): Pt will complete LB dressing sit to stand with min assist. OT Short Term Goal 4 (Week 1): Pt will complete toilet transfers with min guard assist using the RW for support.  Skilled Therapeutic Interventions/Progress Updates:    Session 1: [phone])  Pt in wheelchair to start session with her son present for education.  Spent majority of therapy session talking to him about pt's current condition and impact of bladder infection with assist level compared to eval.  Pt on eval was min assist for functional transfer to the bathroom with use of the RW for support.  Today she needed mod assist for initial sit to stand with max assist for ambulation to the bathroom secondary to LOB posteriorly.  She continues to demonstrate decreased awareness that she is losing balance and states that the therapist is pushing her.  She needed max instructional cueing to maintain upright posture secondary to trunk flexion with decreased ability to stay inside of the walker.  She was oriented to place and reason for hospitalization, but was not oriented to day of the week.  Finished session with pt in the wheelchair awaiting next session with SLP.  Pt's son able to see that pt needs extensive assist but questions how much better she will do after the UTI clears up.  Could not give him an accurate response, but did emphasize the need to have 24 hour supervision at discharge regardless based on expectations of supervision  level goals.   Session 2: (1436-1430)  Pt up in wheelchair to start session.  Took her down to the dayroom via wheelchair where she worked in sitting on simple block design task.  She was unable to complete 4 block design following visual picture or when therapist gave her a single stacked four piece model to follow.  She was able to identify the colors needed to match the picture and pick them out, but unable to organize them in the correct sequence.  Also attempted to have her [place] game to try and stack with the LUE to work on coordination.  She was unable to stack in rows of 3 without max assist secondary to decreased understanding.  Finished session with return to the room and transfer back to the bed with the RW and max assist.  Call button and phone in reach with safety alarm in place.    Therapy Documentation Precautions:  Precautions Precautions: Fall,Other (comment) Precaution Comments: pt reports 2 falls in past 1 year Restrictions Weight Bearing Restrictions: No  Pain: Pain Assessment Pain Scale: 0-10 Pain Score: 5  Pain Type: Acute pain Pain Location: Neck Pain Descriptors / Indicators: Aching Pain Frequency: Intermittent Pain Onset: On-going Pain Intervention(s): Medication (See eMAR) ADL: See Care Tool Section for some details of mobility and selfcare  Therapy/Group: Individual Therapy  Fernanda Twaddell OTR/L 02/06/2021, 3:48 PM

## 2021-02-06 NOTE — Progress Notes (Signed)
Physical Therapy Session Note  Patient Details  Name: Sue [name] MRN: [id number] Date of Birth: 03-Dec-1929  Today's Date: 02/06/2021 PT Individual Time: 0830-0900 PT Individual Time Calculation (min): 30 min   Short Term Goals: Week 1:  PT Short Term Goal 1 (Week 1): Pt will perform supine<>sit with CGA PT Short Term Goal 2 (Week 1): Pt will perform sit<>stands using LRAD with CGA PT Short Term Goal 3 (Week 1): Pt will perform stand pivot transfers using LRAD with CGA PT Short Term Goal 4 (Week 1): Pt will ambulate at least 56ft using LRAD with CGA    Skilled Therapeutic Interventions/Progress Updates:    pt received in bed and agreeable to therapy. Pt directed in donning pants at bed level max A with poor initationPt directed in supine>sit min A for trunk support and sat EOB CGA. Pt directed in x3 Sit to stand mod A for stability with noted posterior lean and frequent LOB with need of VC for anterior weight shift and hip extension. Pt directed in gait training with Rolling walker 20' mod A for turning with walker proximity and balance and VC for safety awareness, walker placement and navigation around obstacles. Pt then reported she needed to use restroom and BSC setup at bedside, mod A for Stand pivot transfer with Rolling walker with poor problem solving and sequencing and safety awareness noted. Pt required max a for clothing management. Pt unable to void and directed in Stand pivot transfer back to bedside min A-mod A with Rolling walker. Pt handed off to OT for next session.   Therapy Documentation Precautions:  Precautions Precautions: Fall,Other (comment) Precaution Comments: pt reports 2 falls in past 1 year Restrictions Weight Bearing Restrictions: No General:   Vital Signs: Therapy Vitals Temp: 98 F (36.7 C) Pulse Rate: 78 Resp: 18 BP: (!) 146/87 Patient Position (if appropriate): Sitting Oxygen Therapy SpO2: 94 % O2 Device: Room Air Pain: Pain Assessment Pain  Scale: 0-10 Pain Score: 5  Pain Type: Acute pain Pain Location: Neck Pain Descriptors / Indicators: Aching Pain Frequency: Intermittent Pain Onset: On-going Pain Intervention(s): Medication (See eMAR) Mobility:   Locomotion :    Trunk/Postural Assessment :    Balance:   Exercises:   Other Treatments:      Therapy/Group: Individual Therapy  Barbaraann Faster 02/06/2021, 3:50 PM

## 2021-02-06 NOTE — Progress Notes (Deleted)
Cardiology Office Note Date:  02/06/2021  Patient ID:  Sue [name] Apr 07, 1930, MRN [id number] PCP:  Sigmund Hazel, MD  Cardiologist:  Dr. Cristal Deer Electrophysiologist: new, Dr. Elberta Fortis  ***refresh   Chief Complaint: *** wound check  History of Present Illness: Sue [name] is a 87 y.o. female with history of RBBB, chronic combined CHF, DM, HLD, hypothyroidism   admitted 01/25/21 with stroke s/p tPA > loop  She comes today to be seen for Dr. Elberta Fortis for wound check visit post loop implant.  *** site *** symptoms *** AFib? *** volume   Device information MDT ILR implanted 01/28/21 for cyrptogenic stroke   Past Medical History:  Diagnosis Date  . CHF (congestive heart failure) (HCC)   . Diabetes mellitus without complication (HCC)   . Hyperlipidemia   . Thyroid disease     Past Surgical History:  Procedure Laterality Date  . LOOP RECORDER INSERTION N/A 01/28/2021   Procedure: LOOP RECORDER INSERTION;  Surgeon: Regan Lemming, MD;  Location: [facility];  Service: Cardiovascular;  Laterality: N/A;    No current facility-administered medications for this visit.   No current outpatient medications on file.   Facility-Administered Medications Ordered in Other Visits  Medication Dose Route Frequency Provider Last Rate Last Admin  . acetaminophen (TYLENOL) tablet 325-650 mg  325-650 mg Oral Q4H PRN Jacquelynn Cree, PA-C   650 mg at 02/04/21 1528  . alum & mag hydroxide-simeth (MAALOX/MYLANTA) 200-200-20 MG/5ML suspension 30 mL  30 mL Oral Q4H PRN Love, Pamela S, PA-C      . aspirin chewable tablet 81 mg  81 mg Oral Daily Jacquelynn Cree, PA-C   81 mg at 02/05/21 0829  . bisacodyl (DULCOLAX) suppository 10 mg  10 mg Rectal Daily PRN Love, Pamela S, PA-C      . buPROPion [facility] SR) 12 hr tablet 150 mg  150 mg Oral BID Delle Reining S, PA-C   150 mg at 02/05/21 2113  . carvedilol (COREG) tablet 3.125 mg  3.125 mg Oral BID WC Ranelle Oyster, MD    3.125 mg at 02/05/21 1712  . cephALEXin (KEFLEX) capsule 250 mg  250 mg Oral Q6H Love, Evlyn Kanner, PA-C   250 mg at 02/06/21 0020  . clopidogrel (PLAVIX) tablet 75 mg  75 mg Oral Daily Erick Colace, MD   75 mg at 02/05/21 0829  . diclofenac Sodium (VOLTAREN) 1 % topical gel 2 g  2 g Topical TID Ranelle Oyster, MD   2 g at 02/05/21 2115  . diphenhydrAMINE (BENADRYL) 12.5 MG/5ML elixir 12.5-25 mg  12.5-25 mg Oral Q6H PRN Love, Pamela S, PA-C      . enoxaparin (LOVENOX) injection 40 mg  40 mg Subcutaneous Q24H Love, Pamela S, PA-C   40 mg at 02/05/21 2113  . furosemide (LASIX) tablet 40 mg  40 mg Oral BID Ranelle Oyster, MD   40 mg at 02/05/21 1712  . Gerhardt's butt cream   Topical BID Jacquelynn Cree, [place]   Given at 02/05/21 2115  . guaiFENesin-dextromethorphan (ROBITUSSIN DM) 100-10 MG/5ML syrup 5-10 mL  5-10 mL Oral Q6H PRN Love, Pamela S, PA-C      . levothyroxine (SYNTHROID) tablet 112 mcg  112 mcg Oral QAC breakfast Jacquelynn Cree, [place]   112 mcg at 02/05/21 0532  . MEDLINE mouth rinse  15 mL Mouth Rinse BID Jacquelynn Cree, PA-C   15 mL at 02/04/21 0805  . pantoprazole (  PROTONIX) EC tablet 40 mg  40 mg Oral QHS Jacquelynn Cree, PA-C   40 mg at 02/05/21 2118  . polyethylene glycol (MIRALAX / GLYCOLAX) packet 17 g  17 g Oral BID Jacquelynn Cree, PA-C   17 g at 02/05/21 2112  . potassium chloride SA (KLOR-CON) CR tablet 20 mEq  20 mEq Oral Daily Ranelle Oyster, MD   20 mEq at 02/05/21 0829  . prochlorperazine (COMPAZINE) tablet 5-10 mg  5-10 mg Oral Q6H PRN Love, Pamela S, PA-C       Or  . prochlorperazine (COMPAZINE) injection 5-10 mg  5-10 mg Intramuscular Q6H PRN Love, Pamela S, PA-C       Or  . prochlorperazine (COMPAZINE) suppository 12.5 mg  12.5 mg Rectal Q6H PRN Love, Pamela S, PA-C      . QUEtiapine (SEROQUEL) tablet 25 mg  25 mg Oral QHS Erick Colace, MD   25 mg at 02/05/21 2113  . senna-docusate (Senokot-S) tablet 2 tablet  2 tablet Oral BID Erick Colace, MD    2 tablet at 02/05/21 2113  . simvastatin (ZOCOR) tablet 40 mg  40 mg Oral q1800 Ranelle Oyster, MD   40 mg at 02/05/21 1712  . sorbitol 70 % solution 30 mL  30 mL Oral Daily PRN Erick Colace, MD   30 mL at 02/03/21 0935    Allergies:   Patient has no known allergies.   Social History:  The patient  reports that she quit smoking about 62 years ago. Her smoking use included cigarettes. She has never used smokeless tobacco. She reports that she does not drink alcohol and does not use drugs.   Family History:  The patient's family history includes Diabetes in her mother.  ROS:  Please see the history of present illness.    All other systems are reviewed and otherwise negative.   PHYSICAL EXAM:  VS:  There were no vitals taken for this visit. BMI: There is no height or weight on file to calculate BMI. Well nourished, well developed, in no acute distress HEENT: normocephalic, atraumatic Neck: no JVD, carotid bruits or masses Cardiac:  *** RRR; no significant murmurs, no rubs, or gallops Lungs:  *** CTA b/l, no wheezing, rhonchi or rales Abd: soft, nontender MS: no deformity or *** atrophy Ext: *** no edema Skin: warm and dry, no rash Neuro:  No gross deficits appreciated Psych: euthymic mood, full affect  *** ILR site is stable, no tethering or discomfort   EKG:  Not done today  Device interrogation done today and reviewed by myself:  ***  01/26/21: TTE IMPRESSIONS  1. Left ventricular ejection fraction, by estimation, is 55 to 60%. The  left ventricle has normal function. Left ventricular diastolic parameters  are consistent with Grade I diastolic dysfunction (impaired relaxation).  2. Right ventricular systolic function is normal. The right ventricular  size is normal. There is normal pulmonary artery systolic pressure. The  estimated right ventricular systolic pressure is 30.7 mmHg.  3. The mitral valve is normal in structure. No evidence of mitral valve   regurgitation. No evidence of mitral stenosis.  4. Aortic valve regurgitation is not visualized. No aortic stenosis is  present.  5. The inferior vena cava is dilated in size with >50% respiratory  variability, suggesting right atrial pressure of 8 mmHg.    Recent Labs: 01/09/2021: B Natriuretic Peptide 57.0 01/26/2021: Magnesium 1.9; TSH 3.783 02/02/2021: ALT 20; BUN 8; Creatinine, Ser 1.02; Hemoglobin 14.9;  Platelets 502; Potassium 3.8; Sodium 138  01/26/2021: Cholesterol 182; HDL 25; LDL Cholesterol 123; Total CHOL/HDL Ratio 7.3; Triglycerides 168; VLDL 34   Estimated Creatinine Clearance: 39 mL/min (A) (by C-G formula based on SCr of 1.02 mg/dL (H)).   Wt Readings from Last 3 Encounters:  02/01/21 182 lb 15.7 oz (83 kg)  01/26/21 198 lb 6.6 oz (90 kg)  01/09/21 200 lb (90.7 kg)     Other studies reviewed: Additional studies/records reviewed today include: summarized above  ASSESSMENT AND PLAN:  1. ILR     ***  2. Chronic CHF (combined)     LVEF 50-55%, grade I DD by her last echo        ***  Disposition: F/u with ***  Current medicines are reviewed at length with the patient today.  The patient did not have any concerns regarding medicines.  Norma Fredrickson, PA-C 02/06/2021 5:08 AM     [facility] [address] [place] [address] [phone] (office)  [phone] (fax)

## 2021-02-07 DIAGNOSIS — I63423 Cerebral infarction due to embolism of bilateral anterior cerebral arteries: Secondary | ICD-10-CM

## 2021-02-07 NOTE — Progress Notes (Signed)
Speech Language Pathology Daily Session Note  Patient Details  Name: Sue [name] MRN: [id number] Date of Birth: 12-14-1929  Today's Date: 02/07/2021 SLP Individual Time: 7829-5621 SLP Individual Time Calculation (min): 30 min  Short Term Goals: Week 1: SLP Short Term Goal 1 (Week 1): Pt will demonstrate sustained attention in 30 minute intervals with min A verbal cues. SLP Short Term Goal 2 (Week 1): Pt will demonstrate recall of novel and daily information with visual aid given min A verbal cues. SLP Short Term Goal 3 (Week 1): Pt will demonstrate self-monitoring and self-awareness of functional errors with mod A verbal cues.  Skilled Therapeutic Interventions:   Patient seen for skilled ST session with focus on cognitive function. Patient did not state that she recalled meeting SLP yesterday. She was oriented to month, year and aware of discharge date. She continues to question how she had a UTI and was not aware that her medications had been adjusted/changed. SLP did note that patient performed best when SLP wearing clear mask, speaking directly to her (facing her) and using pocket talker amplifier. She appeared less confuseds than previous date, however she does not present with adequate awareness of deficits and impact and wants to go home. Patient continues to benefit from skilled SLP intervention to maximize cognitive-linguistic function prior to discharge.  Pain Pain Assessment Pain Scale: 0-10 Faces Pain Scale: No hurt  Therapy/Group: Individual Therapy  Angela Nevin, MA, CCC-SLP Speech Therapy

## 2021-02-07 NOTE — Progress Notes (Signed)
PROGRESS NOTE   Subjective/Complaints: No complaints this morning Patient's chart reviewed- No issues reported overnight SBP elevated Hard of hearing  ROS- neg CP (some soreness at loop site) no SOB, no N/V/D, +hard of hearing  Objective:   No results found. No results for input(s): WBC, HGB, HCT, PLT in the last 72 hours. No results for input(s): NA, K, CL, CO2, GLUCOSE, BUN, CREATININE, CALCIUM in the last 72 hours.  Intake/Output Summary (Last 24 hours) at 02/07/2021 1114 Last data filed at 02/06/2021 1902 Gross per 24 hour  Intake 474 ml  Output --  Net 474 ml        Physical Exam: Vital Signs Blood pressure (!) 152/87, pulse 86, temperature 97.6 F (36.4 C), resp. rate 20, height 5\' 4"  (1.626 m), weight 83 kg, SpO2 97 %. Gen: no distress, normal appearing HEENT: oral mucosa pink and moist, NCAT Cardio: Reg rate Chest: normal effort, normal rate of breathing Abd: soft, non-distended Ext: no edema Psych: pleasant, normal affect Skin: intact   Neurologic: Cranial nerves II through XII intact, motor strength is 4/5 in bilateral deltoid, bicep, tricep, grip, hip flexor, knee extensors, ankle dorsiflexor and plantar flexor  Cerebellar exam intention tremor vs mild dysmetria  finger to nose to finger as well as heel to shin in bilateral upper and lower extremities Musculoskeletal: Full range of motion in all 4 extremities. No joint swelling    Assessment/Plan: 1. Functional deficits which require 3+ hours per day of interdisciplinary therapy in a comprehensive inpatient rehab setting.  Physiatrist is providing close team supervision and 24 hour management of active medical problems listed below.  Physiatrist and rehab team continue to assess barriers to discharge/monitor patient progress toward functional and medical goals  Care Tool:  Bathing    Body parts bathed by patient: Right arm,Left  arm,Chest,Abdomen,Right upper leg,Left upper leg,Face,Left lower leg,Right lower leg,Front perineal area,Buttocks         Bathing assist Assist Level: Minimal Assistance - Patient > 75%     Upper Body Dressing/Undressing Upper body dressing   What is the patient wearing?: Pull over shirt    Upper body assist Assist Level: Minimal Assistance - Patient > 75%    Lower Body Dressing/Undressing Lower body dressing      What is the patient wearing?: Incontinence brief     Lower body assist Assist for lower body dressing: Moderate Assistance - Patient 50 - 74%     Toileting Toileting Toileting Activity did not occur and hygiene only): Refused  Toileting assist Assist for toileting: Maximal Assistance - Patient 25 - 49%     Transfers Chair/bed transfer  Transfers assist     Chair/bed transfer assist level: Moderate Assistance - Patient 50 - 74% Chair/bed transfer assistive device: Press photographer   Ambulation assist      Assist level: Maximal Assistance - Patient 25 - 49% Assistive device: Walker-rolling Max distance: 158ft   Walk 10 feet activity   Assist     Assist level: Moderate Assistance - Patient - 50 - 74% Assistive device: Walker-rolling   Walk 50 feet activity   Assist    Assist level: Moderate  Assistance - Patient - 50 - 74% Assistive device: Walker-rolling    Walk 150 feet activity   Assist Walk 150 feet activity did not occur: Safety/medical concerns         Walk 10 feet on uneven surface  activity   Assist Walk 10 feet on uneven surfaces activity did not occur: Safety/medical concerns         Wheelchair     Assist Will patient use wheelchair at discharge?: No (TBD)             Wheelchair 50 feet with 2 turns activity    Assist            Wheelchair 150 feet activity     Assist          Blood pressure (!) 152/87, pulse 86, temperature 97.6 F (36.4 C),  resp. rate 20, height 5\' 4"  (1.626 m), weight 83 kg, SpO2 97 %.  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 cardioembolic has loop recorder  -patient may shower -ELOS/Goals: 6/14. Supervision  -Continue CIR 2. Antithrombotics: -DVT/anticoagulation:Pharmaceutical:Lovenox -antiplatelet therapy: DAPT X 3 weeks followed by ASA alone. 3. Pain Management:Tylenol prn. -add voltaren gel for right 1st MTP jt pain -observe for pain tolerance with WB in therapy 4. Mood:LCSW to follow for evaluation and support. -chronic depression: wellbutrin SR 150mg  daily -antipsychotic agents: N/A 5. Neuropsych: This patientis somewhatcapable of making decisions on herown behalf. 6. Skin/Wound Care:Routine pressure relief measures. 7. Fluids/Electrolytes/Nutrition:Monitor I/O. Repeat lytes nl  8. HTN: Monitor BP tid. Avoid hypoperfusion.              -resume coreg 3.125mg  BID  Vitals:   02/06/21 2019 02/07/21 0518  BP: 133/79 (!) 152/87  Pulse: 67 86  Resp: 19 20  Temp: 97.8 F (36.6 C) 97.6 F (36.4 C)  SpO2: 93% 97%  SBP elevated, but other times well controlled- continue to monitor 9. T2DM: Hgb A1C-6.9. Monitor BS ac/hs and use SSI for elevated BS --Diet modification.  10. Dyslipidemia: Continue Zocor.  11. Hgb now back to normal range although on hi side for female  31. Intermittent hypokalemia: Recheck labs in am.              -potassium supplementation 13. Hypothyroid: On synthroid 112 mcg for supplement.  14. Constipation: On Miralax-->increase to bid              -continue senna-s qhs. 15. Chronic combined systolic/diastolic CHF:             -resuming coreg 3.125 and lasix 40mg  bid (home meds) 16.  Urinary retention reviewed meds no anticholinergics, has not taken prn trazodone,  likely immobility and ? Diabetic cystopathy although the Hgb A1 C is fairly low- this has improved UA + now on Keflex monitor UCx for result- >100K GNR. Resistant to cephazolin- changed to Macrobid on 6/3  LOS: 6 days A FACE TO FACE EVALUATION WAS PERFORMED  Sue [name] Braxtyn Bojarski 02/07/2021, 11:14 AM

## 2021-02-07 NOTE — Progress Notes (Signed)
Physical Therapy Session Note  Patient Details  Name: Sue [name] MRN: [id number] Date of Birth: [birth year]  PT Missed Time: 60 Minutes Missed Time Reason: Patient fatigue;Patient unwilling to participate  Pt received supine in bed with NT present performing bladder scan. Pt declines participation in therapy stating she is tired and is exhausted from lunch and having just gone to the bathroom. Pt continues to demo some confusion because when therapist asked pt the time of day pt misunderstood the question to be asking the time of the year (ie. summer, fall, etc.) Therapist attempts to redirect and encourage patient for OOB activity but pt continues to repeatedly decline. Thearpist confirmed with nursing that pt did just get OOB to go to the bathroom and nursing staff reports having to use the stedy for safety as pt was having increased difficulty coming to stand and was having a difficult time following commands to perform transfers safely. Pt left supine in bed with needs in reach, lights on to promote improved sleep/wake cycles, and bed alarm on. Missed 60 minutes of skilled physical therapy.  Ginny Forth , PT, DPT, CSRS  02/07/2021, 12:40 PM

## 2021-02-07 NOTE — Progress Notes (Signed)
Physical Therapy Session Note  Patient Details  Name: Sue [name] MRN: [id number] Date of Birth: June 09, 1930  Therapist attempted to make-up missed time from earlier this afternoon. Pt supine in bed upon arrival and continues to appear confused and continues to decline progression to OOB mobility despite encouragement. Pt left supine in bed with needs in reach and bed alarm on.  Ginny Forth , PT, DPT, CSRS  02/07/2021, 5:42 PM

## 2021-02-08 MED ORDER — QUETIAPINE FUMARATE 25 MG PO TABS
25.0000 mg | ORAL_TABLET | Freq: Every day | ORAL | Status: DC | PRN
  Administered 2021-02-08: 25 mg via ORAL
  Filled 2021-02-08: qty 1

## 2021-02-08 NOTE — Progress Notes (Signed)
PROGRESS NOTE   Subjective/Complaints: No complaints Impaired cognition Refused PT yesterday but participated with SLP- does better with clear masks  ROS- neg CP (some soreness at loop site) no SOB, no N/V/D, +hard of hearing, denies pain  Objective:   No results found. No results for input(s): WBC, HGB, HCT, PLT in the last 72 hours. No results for input(s): NA, K, CL, CO2, GLUCOSE, BUN, CREATININE, CALCIUM in the last 72 hours.  Intake/Output Summary (Last 24 hours) at 02/08/2021 1037 Last data filed at 02/08/2021 0700 Gross per 24 hour  Intake 290 ml  Output 1600 ml  Net -1310 ml        Physical Exam: Vital Signs Blood pressure (!) 118/58, pulse 64, temperature 97.6 F (36.4 C), resp. rate 20, height 5\' 4"  (1.626 m), weight 83 kg, SpO2 91 %. Gen: no distress, normal appearing HEENT: oral mucosa pink and moist, NCAT Cardio: Reg rate Chest: normal effort, normal rate of breathing Abd: soft, non-distended Ext: no edema Psych: pleasant, normal affect Skin: intact   Neurologic: Cranial nerves II through XII intact, motor strength is 4/5 in bilateral deltoid, bicep, tricep, grip, hip flexor, knee extensors, ankle dorsiflexor and plantar flexor  Cerebellar exam intention tremor vs mild dysmetria  finger to nose to finger as well as heel to shin in bilateral upper and lower extremities Musculoskeletal: Full range of motion in all 4 extremities. No joint swelling    Assessment/Plan: 1. Functional deficits which require 3+ hours per day of interdisciplinary therapy in a comprehensive inpatient rehab setting.  Physiatrist is providing close team supervision and 24 hour management of active medical problems listed below.  Physiatrist and rehab team continue to assess barriers to discharge/monitor patient progress toward functional and medical goals  Care Tool:  Bathing    Body parts bathed by patient: Right arm,Left  arm,Chest,Abdomen,Right upper leg,Left upper leg,Face,Left lower leg,Right lower leg,Front perineal area,Buttocks         Bathing assist Assist Level: Minimal Assistance - Patient > 75%     Upper Body Dressing/Undressing Upper body dressing   What is the patient wearing?: Pull over shirt    Upper body assist Assist Level: Minimal Assistance - Patient > 75%    Lower Body Dressing/Undressing Lower body dressing      What is the patient wearing?: Incontinence brief     Lower body assist Assist for lower body dressing: Moderate Assistance - Patient 50 - 74%     Toileting Toileting Toileting Activity did not occur and hygiene only): Refused  Toileting assist Assist for toileting: Maximal Assistance - Patient 25 - 49%     Transfers Chair/bed transfer  Transfers assist     Chair/bed transfer assist level: Moderate Assistance - Patient 50 - 74% Chair/bed transfer assistive device: Press photographer   Ambulation assist      Assist level: Maximal Assistance - Patient 25 - 49% Assistive device: Walker-rolling Max distance: 161ft   Walk 10 feet activity   Assist     Assist level: Moderate Assistance - Patient - 50 - 74% Assistive device: Walker-rolling   Walk 50 feet activity   Assist  Assist level: Moderate Assistance - Patient - 50 - 74% Assistive device: Walker-rolling    Walk 150 feet activity   Assist Walk 150 feet activity did not occur: Safety/medical concerns         Walk 10 feet on uneven surface  activity   Assist Walk 10 feet on uneven surfaces activity did not occur: Safety/medical concerns         Wheelchair     Assist Will patient use wheelchair at discharge?: No (TBD)             Wheelchair 50 feet with 2 turns activity    Assist            Wheelchair 150 feet activity     Assist          Blood pressure (!) 118/58, pulse 64, temperature 97.6 F (36.4 C),  resp. rate 20, height 5\' 4"  (1.626 m), weight 83 kg, SpO2 91 %.  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 cardioembolic has loop recorder  -patient may shower -ELOS/Goals: 6/14. Supervision  -Continue CIR 2. Antithrombotics: -DVT/anticoagulation:Pharmaceutical:Lovenox -antiplatelet therapy: DAPT X 3 weeks followed by ASA alone. 3. Pain Management:Tylenol prn. -add voltaren gel for right 1st MTP jt pain -observe for pain tolerance with WB in therapy 4. Mood:LCSW to follow for evaluation and support. -chronic depression: wellbutrin SR 150mg  daily -antipsychotic agents: N/A 5. Neuropsych: This patientis somewhatcapable of making decisions on herown behalf. 6. Skin/Wound Care:Routine pressure relief measures. 7. Fluids/Electrolytes/Nutrition:Monitor I/O. Repeat lytes nl  8. HTN: Monitor BP tid. Avoid hypoperfusion.              -resume coreg 3.125mg  BID  Vitals:   02/07/21 1940 02/08/21 0515  BP: 127/72 (!) 118/58  Pulse: 70 64  Resp: 20 20  Temp: 98.3 F (36.8 C) 97.6 F (36.4 C)  SpO2: 94% 91%  BP labile: continue to monitor.  9. T2DM: Hgb A1C-6.9. Monitor BS ac/hs and use SSI for elevated BS --Diet modification.  10. Dyslipidemia: Continue Zocor.  11. Hgb now back to normal range although on hi side for female  46. Intermittent hypokalemia: Recheck labs in am.              -potassium supplementation 13. Hypothyroid: On synthroid 112 mcg for supplement.  14. Constipation: On Miralax-->increase to bid              -continue senna-s qhs. 15. Chronic combined systolic/diastolic CHF:             -resuming coreg 3.125 and lasix 40mg  bid (home meds) 16.  Urinary retention reviewed meds no anticholinergics, has not taken prn trazodone, likely immobility and ? Diabetic  cystopathy although the Hgb A1 C is fairly low- this has improved UA + now on Keflex monitor UCx for result- >100K GNR. Resistant to cephazolin- changed to Macrobid on 6/3. F/u CBC on Monday.   LOS: 7 days A FACE TO FACE EVALUATION WAS PERFORMED  Sue [name] 02/08/2021, 10:37 AM

## 2021-02-09 LAB — CBC
HCT: 53 % — ABNORMAL HIGH (ref 36.0–46.0)
Hemoglobin: 17.2 g/dL — ABNORMAL HIGH (ref 12.0–15.0)
MCH: 31.4 pg (ref 26.0–34.0)
MCHC: 32.5 g/dL (ref 30.0–36.0)
MCV: 96.7 fL (ref 80.0–100.0)
Platelets: 666 10*3/uL — ABNORMAL HIGH (ref 150–400)
RBC: 5.48 MIL/uL — ABNORMAL HIGH (ref 3.87–5.11)
RDW: 14.7 % (ref 11.5–15.5)
WBC: 9.6 10*3/uL (ref 4.0–10.5)
nRBC: 0 % (ref 0.0–0.2)

## 2021-02-09 LAB — BASIC METABOLIC PANEL
Anion gap: 14 (ref 5–15)
BUN: 19 mg/dL (ref 8–23)
CO2: 28 mmol/L (ref 22–32)
Calcium: 9.6 mg/dL (ref 8.9–10.3)
Chloride: 93 mmol/L — ABNORMAL LOW (ref 98–111)
Creatinine, Ser: 1.19 mg/dL — ABNORMAL HIGH (ref 0.44–1.00)
GFR, Estimated: 44 mL/min — ABNORMAL LOW (ref >60)
Glucose, Bld: 146 mg/dL — ABNORMAL HIGH (ref 70–99)
Potassium: 3.5 mmol/L (ref 3.5–5.1)
Sodium: 135 mmol/L (ref 135–145)

## 2021-02-09 NOTE — Progress Notes (Signed)
Pt noted with increased agitation and aggression during shift change. Attempting to get out of bed unassisted. Difficult to redirect or reorient. Dr. Carlis Abbott notified by previous nurse, received order for prn seroquel to be given at that time. PRN dose given, pt assisted to wheelchair with chair alarm and brought to nurses station for closer observation. Pt noted resting quietly in bed at time of scheduled hs seroquel, held at that time. Pt requiring intermittent cath x2 this shift with last being at 0600 for . Pt not voiding on own all shift despite encouragement to get up for toileting.

## 2021-02-09 NOTE — Progress Notes (Signed)
Physical Therapy Session Note  Patient Details  Name: Sue [name] MRN: [id number] Date of Birth: 01/28/1930  Today's Date: 02/09/2021 PT Individual Time: 1130-1200 PT Individual Time Calculation (min): 30 min   Short Term Goals: Week 1:  PT Short Term Goal 1 (Week 1): Pt will perform supine<>sit with CGA PT Short Term Goal 2 (Week 1): Pt will perform sit<>stands using LRAD with CGA PT Short Term Goal 3 (Week 1): Pt will perform stand pivot transfers using LRAD with CGA PT Short Term Goal 4 (Week 1): Pt will ambulate at least 78ft using LRAD with CGA  Skilled Therapeutic Interventions/Progress Updates: Pt presents sitting in recliner and agreeable to therapy.  Pt performed sit to stand transfers from recliner and bed w/ min A, but increased verbal cues for initiation, esp. w/ scooting forward and forward lean as tends to be on heels.  Pt amb in room x 10' w/ min A and verbal cues for walker management in confined spaces.  Pt amb 113', 55' and 44' w/ min A and verbal cues for posture and walker management.  Pt requires increased cues for safe approach to seat, especially w/ getting into middle of seat.  Pt remained in recliner w/ chair alarm on, LEs elevated and all needs in reach.  Pt requested a new cup and PT poured juice for pt.     Therapy Documentation Precautions:  Precautions Precautions: Fall,Other (comment) Precaution Comments: pt reports 2 falls in past 1 year Restrictions Weight Bearing Restrictions: No General:   Vital Signs:   Pain:0/10 Pain Assessment Pain Scale: 0-10 Pain Score: 0-No pain Mobility:      Therapy/Group: Individual Therapy  Lucio Edward 02/09/2021, 12:04 PM

## 2021-02-09 NOTE — Discharge Instructions (Addendum)
Inpatient Rehab Discharge Instructions  Sue [name] Discharge date and time: 02/19/21    Activities/Precautions/ Functional Status: Activity: no lifting, driving, or strenuous exercise till cleared by MD Diet: cardiac diet and diabetic diet Wound Care: keep wound clean and dry    Functional status:  ___ No restrictions     ___ Walk up steps independently _X__ 24/7 supervision/assistance   ___ Walk up steps with assistance ___ Intermittent supervision/assistance  ___ Bathe/dress independently ___ Walk with walker     _X__ Bathe/dress with assistance ___ Walk Independently    ___ Shower independently ___ Walk with assistance    ___ Shower with assistance _X__ No alcohol     ___ Return to work/school ________   COMMUNITY REFERRALS UPON DISCHARGE:    Home Health:   PT     OT     ST                   Agency:[facility] Phone:[phone]    Medical Equipment/Items Ordered:Rolling Walker, Bedside Commode, Tub Transfer Bench                                                 Agency/Supplier: [employer]   GENERAL COMMUNITY RESOURCES FOR PATIENT/FAMILY:    Caregiver Support:[facility]  [phone]  Special Instructions: 1. Perform foley care twice a day and  anytime after having a BM. 2. Needs hands on assist with all activity.  3. Family needs to administer medications and assist with meals.   STROKE/TIA DISCHARGE INSTRUCTIONS SMOKING Cigarette smoking nearly doubles your risk of having a stroke & is the single most alterable risk factor  If you smoke or have smoked in the last 12 months, you are advised to quit smoking for your health. Most of the excess cardiovascular risk related to smoking disappears within a year of stopping. Ask you doctor about anti-smoking medications [facility] Quit Line: 1-800-QUIT NOW Free Smoking Cessation Classes [phone]  CHOLESTEROL Know your levels; limit fat & cholesterol in your diet  Lipid Panel     Component  Value Date/Time   CHOL 182 01/26/2021 0439   TRIG 168 (H) 01/26/2021 0439   HDL 25 (L) 01/26/2021 0439   CHOLHDL 7.3 01/26/2021 0439   VLDL 34 01/26/2021 0439   LDLCALC 123 (H) 01/26/2021 0439     Many patients benefit from treatment even if their cholesterol is at goal. Goal: Total Cholesterol (CHOL) less than 160 Goal:  Triglycerides (TRIG) less than 150 Goal:  HDL greater than 40 Goal:  LDL (LDLCALC) less than 100   BLOOD PRESSURE American Stroke Association blood pressure target is less that 120/80 mm/Hg  Your discharge blood pressure is:  BP: (!) 160/92 Monitor your blood pressure Limit your salt and alcohol intake Many individuals will require more than one medication for high blood pressure  DIABETES (A1c is a blood sugar average for last 3 months) Goal HGBA1c is under 7% (HBGA1c is blood sugar average for last 3 months)  Diabetes:Diagnosis of diabetes:       Lab Results  Component Value Date   HGBA1C 6.9 (H) 01/26/2021    Your HGBA1c can be lowered with medications, healthy diet, and exercise. Check your blood sugar as directed by your physician Call your physician if you experience unexplained or low blood sugars.  PHYSICAL ACTIVITY/REHABILITATION  Goal is 30 minutes at least 4 days per week  Activity: Increase activity slowly, Therapies: see above Return to work:  N/A Activity decreases your risk of heart attack and stroke and makes your heart stronger.  It helps control your weight and blood pressure; helps you relax and can improve your mood. Participate in a regular exercise program. Talk with your doctor about the best form of exercise for you (dancing, walking, swimming, cycling).  DIET/WEIGHT Goal is to maintain a healthy weight  Your discharge diet is:  Diet Order             Diet heart healthy/carb modified Room service appropriate? Yes; Fluid consistency: Thin  Diet effective now                   liquids Your height is:  Height: 5\' 4"  (162.6  cm) Your current weight is: Weight: 83 kg Your Body Mass Index (BMI) is:  BMI (Calculated): 31.39 Following the type of diet specifically designed for you will help prevent another stroke. Your goal weight is:   Your goal Body Mass Index (BMI) is 19-24. Healthy food habits can help reduce 3 risk factors for stroke:  High cholesterol, hypertension, and excess weight.  RESOURCES Stroke/Support Group:  Call [phone]   STROKE EDUCATION PROVIDED/REVIEWED AND GIVEN TO PATIENT Stroke warning signs and symptoms How to activate emergency medical system (call 911). Medications prescribed at discharge. Need for follow-up after discharge. Personal risk factors for stroke. Pneumonia vaccine given:  Flu vaccine given:  My questions have been answered, the writing is legible, and I understand these instructions.  I will adhere to these goals & educational materials that have been provided to me after my discharge from the hospital.      My questions have been answered and I understand these instructions. I will adhere to these goals and the provided educational materials after my discharge from the hospital.  Patient/Caregiver Signature _______________________________ Date __________  Clinician Signature _______________________________________ Date __________  Please bring this form and your medication list with you to all your follow-up doctor's appointments.

## 2021-02-09 NOTE — Progress Notes (Signed)
Occupational Therapy Weekly Progress Note  Patient Details  Name: Sue [name] MRN: [id number] Date of Birth: 04-29-1930  Beginning of progress report period: Feb 02, 2021 End of progress report period: February 09, 2021  Today's Date: 02/09/2021 OT Individual Time: 6945-0388 OT Individual Time Calculation (min): 27 min    Patient has met 0 of 4 short term goals.  Ms. Hurlbutt has made fluctuating progress this week with OT.  Initially, she was able to complete UB selfcare at min assist level with LB selfcare at min to mod assist.  Transfers were also a light mod assist on initial eval.  Over the past week her cognition has declined as well as her functional independence.  She needs overall mod to max assist for transfers with use of the RW as well as max assist for LB selfcare and toileting tasks sit to stand.  UB selfcare is min to mod assist.  She continues to be extremely confused with decreased awareness of her deficits.  Standing balance is max assist with increased posterior lean and LOB. She still demonstrates slight weakness and decreased coordination in the LUE compared to the right but she continues to use it at a diminished level for selfcare tasks.  Medically it was determined that she had a UTI and is being treated for this, but so far no improvement has been noted functionally over the past 4-5 days.  Feel overall that she needs to be at a supervision to min assist level for transfer back home with her son, that will have to arrange 24 hr care.  If this cannot be established, she will likely need SNF placement.  Will continue with current OT POC and expected discharge on 6/14 based on last weeks conference.  May need to be extended if the goal is still for returning home.  Recommend continued CIR level therapy at this time.    Patient continues to demonstrate the following deficits: muscle weakness, muscle joint tightness and muscle paralysis, impaired timing and sequencing, unbalanced muscle  activation and decreased coordination, decreased initiation, decreased attention, decreased awareness, decreased problem solving, decreased safety awareness, decreased memory and delayed processing and decreased standing balance, decreased postural control, hemiplegia and decreased balance strategies and therefore will continue to benefit from skilled OT intervention to enhance overall performance with BADL.  Patient progressing toward long term goals..  Continue plan of care.  OT Short Term Goals Week 2:  OT Short Term Goal 1 (Week 2): Pt will complete UB dressing with supervision for two consecutive sessions. OT Short Term Goal 2 (Week 2): Pt will complete LB bathing sit to stand from wheelchair or shower seat with min assist. OT Short Term Goal 3 (Week 2): Pt will complete LB dressing sit to stand with min assist. OT Short Term Goal 4 (Week 2): Pt will complete toilet transfers with min assist using the RW for support.  Skilled Therapeutic Interventions/Progress Updates:    Pt in bed to start session.  She reports wanting to go home with therapist re-directing her that she can't at this time.  She demonstrates decreased awareness of her deficits, holding onto her schedule stating "These are the list of girls I have to pick up and take home."  Therapist again re-directing her that she is holding her schedule and not a list of girls.  She does not demonstrate any understanding once instructed on this.  She did complete transfer from supine to sit with min assist as she wanted to go to  the "waiting room" where she could get picked up.  Max assist for stand pivot transfer to the wheelchair from the EOB with RW.  Pt with poor ability to stay in the walker or turn all the way around to the chair.  Increased posterior lean with pt stating therapist is pushing her down.  Once in the chair, therapist took her out of the room where he continued to re-direct her to the situation and not being able to go home or  pick up any girls.  She was however aware of being in the hospital and having a stroke, but no awareness of deficits.  After pushing her up the hall, she requested to go to the waiting room so she could call her son and tell him that she wasn't going home today.  Therapist re-directed her that this would have to happen in the room.  Took her back to the room where she completed mod assist transfer stand pivot with the RW.  She then completed supine to sit with min assist and therapist dialed her son's number so she could talk to him on the phone.  She continued to discuss with him not being able to pick up the "girls" and that could he let their parents know.  Call button in lap with safety alarm in place.  Nursing aware of ongoing confusion.    Therapy Documentation Precautions:  Precautions Precautions: Fall,Other (comment) Precaution Comments: pt reports 2 falls in past 1 year Restrictions Weight Bearing Restrictions: No  Pain: Pain Assessment Pain Scale: Faces Pain Score: 0-No pain ADL: See Care Tool Section for some details of mobility and selfcare  Therapy/Group: Individual Therapy  Jaquasia Doscher OTR/L 02/09/2021, 4:28 PM

## 2021-02-09 NOTE — Progress Notes (Signed)
Speech Language Pathology Weekly Progress and Session Note  Patient Details  Name: MARIPOSA SHORES MRN: [id number] Date of Birth: 16-Aug-1930  Beginning of progress report period: Feb 02, 2021 End of progress report period: February 09, 2021  Today's Date: 02/09/2021 SLP Individual Time: 0354-6568 SLP Individual Time Calculation (min): 57 min  Short Term Goals: Week 1: SLP Short Term Goal 1 (Week 1): Pt will demonstrate sustained attention in 30 minute intervals with min A verbal cues. SLP Short Term Goal 1 - Progress (Week 1): Met SLP Short Term Goal 2 (Week 1): Pt will demonstrate recall of novel and daily information with visual aid given min A verbal cues. SLP Short Term Goal 2 - Progress (Week 1): Progressing toward goal SLP Short Term Goal 3 (Week 1): Pt will demonstrate self-monitoring and self-awareness of functional errors with mod A verbal cues. SLP Short Term Goal 3 - Progress (Week 1): Met    New Short Term Goals: Week 1: SLP Short Term Goal 1 (Week 1): Pt will demonstrate sustained attention in 30 minute intervals with min A verbal cues. SLP Short Term Goal 1 - Progress (Week 1): Met SLP Short Term Goal 2 (Week 1): Pt will demonstrate recall of novel and daily information with visual aid given min A verbal cues. SLP Short Term Goal 2 - Progress (Week 1): Progressing toward goal SLP Short Term Goal 3 (Week 1): Pt will demonstrate self-monitoring and self-awareness of functional errors with mod A verbal cues. SLP Short Term Goal 3 - Progress (Week 1): Met  Weekly Progress Updates: Pt has made steady progress and met 2 out of 3 STGs this reporting period. Pt progress is at times limited by agitation and decreased participation, however utilizing clear mask for increased communication effectiveness and education on goals of treatment have been beneficial for increased motivation. Pt remains with deficits in awareness of current cognitive and physical deficits that will impact safety  with daily living tasks in d/c environment. Pt benefits from verbal and visual cues to increase recall of functional information at this time. Pt generally sustains attention throughout tx sessions, needs extra time to ID problems and potential hazards. Pt reports her cognitive status including memory is approaching baseline, son will assist with all higher level cognitive tasks at home. Cont to recommend 24/7 supervision/assist at this time.    Intensity: Minumum of 1-2 x/day, 30 to 90 minutes Frequency: 3 to 5 out of 7 days Duration/Length of Stay: ELOS 6/14 Treatment/Interventions: Cognitive remediation/compensation;Cueing hierarchy;Functional tasks;Internal/external aids;Patient/family education  Daily Session  Skilled Therapeutic Interventions: Pt seen for skilled ST with focus on cognitive goals, SLP wearing clear mask to increase communication. Pt sitting upright in recliner, pleasant and cooperative with all therapeutic tasks. SLP facilitating simple problem solving task by providing min A verbal cues to ID problems, supervision level cues to produce solutions to problems. Pt continues to report desire to discharge earlier than planned date, states she is a "homebody" and is uncomfortable in the hospital, especially at night. SLP and pt discussing strategies to reduce anxiety and fear. Pt continues to demonstrate decreased awareness of current deficits, however recall and carryover of functional information is improving. Pt left in recliner with alarm belt set and all needs within reach. Cont ST POC.      General    Pain Pain Assessment Pain Scale: 0-10 Pain Score: 0-No pain  Therapy/Group: Individual Therapy  Dewaine Conger 02/09/2021, 11:20 AM

## 2021-02-09 NOTE — Progress Notes (Signed)
Physical Therapy Session Note  Patient Details  Name: Sue [name] MRN: [id number] Date of Birth: 1930-04-08  Today's Date: 02/09/2021 PT Individual Time: 7564-3329 PT Individual Time Calculation (min): 72 min   Short Term Goals: Week 1:  PT Short Term Goal 1 (Week 1): Pt will perform supine<>sit with CGA PT Short Term Goal 2 (Week 1): Pt will perform sit<>stands using LRAD with CGA PT Short Term Goal 3 (Week 1): Pt will perform stand pivot transfers using LRAD with CGA PT Short Term Goal 4 (Week 1): Pt will ambulate at least 36ft using LRAD with CGA Week 2:    Week 3:     Skilled Therapeutic Interventions/Progress Updates:    Pain:  Pt reports no pain.  Treatment to tolerance.  Rest breaks and repositioning as needed.  Pt initially supine, irritable and refusing PT.  Pt informed that MD would like her to get oob w/me this am, reluctantly agreeable but at her own pace.  States "go away, I will get up on my own" and eventually intiates supine to sit and asks therapist for assist.  Performs w/min assist.  Scoots in sitting w/min assist, mild post tendency initially which resolves w/repositioning.  Pt sits several min "let me get situated" and therapist dons ted hose and grip socks.   Sit to stand w/heavy mod assist due to post/R lean. Attempts transfer w/very poor safety awareness, returned to sitting on edge of bed for safety. stand pivot transfer to wc w/heavy mod assist, max cues for safety From wc level pt washes face, brushes teeth, and combs hair w/set up.  theraipst assists w/styling hair up. Pt perseverates on locating her bra and requires redirection from this as no bra is in her room. Pt transported to day room for continued rx session, lower stim environment.  Sit to stand w/min assist, cues for safe hand placement, and gait x 120ft w/RW and min assist, cues to attend to objects on L, cues for safety. Pt w/forward flexed posture and mild L lean.  Deviations increase w/fatigue,  mild dyspnea.  Pt sat several min, refused repeated gait. "Just let me sit here".  Sat several min then asks "Will it be a problem for me to go home on Saturday if I don't take a second lap (walking)?"  Pt then agrees.  Sit to stand w/mod assist, max cues for safety.   Gait x 175ft w/min assist, deviations as above, increased flexed posture w/fatigue, consistently collides L side of walker w/environmental objects on L then corrects walker navigation.  Poor safety awareness w/transfers in and out of wc.   Gait 52ft w/RW and min assist, turn/sit to recliner w/cues for safety, cues to reposition centered in recliner.   Pt left oob in recliner w/chair alarm set and needs in reach.   Therapy Documentation Precautions:  Precautions Precautions: Fall,Other (comment) Precaution Comments: pt reports 2 falls in past 1 year Restrictions Weight Bearing Restrictions: No    Therapy/Group: Individual Therapy  [name], PT   Shearon Balo 02/09/2021, 9:58 AM

## 2021-02-09 NOTE — Progress Notes (Signed)
PROGRESS NOTE   Subjective/Complaints:  Pt states she has not had blood draw thiws am no bladder issues   ROS- neg CP (some soreness at loop site) no SOB, no N/V/D, +hard of hearing, denies pain  Objective:   No results found. No results for input(s): WBC, HGB, HCT, PLT in the last 72 hours. No results for input(s): NA, K, CL, CO2, GLUCOSE, BUN, CREATININE, CALCIUM in the last 72 hours.  Intake/Output Summary (Last 24 hours) at 02/09/2021 0801 Last data filed at 02/09/2021 0600 Gross per 24 hour  Intake 516 ml  Output 1275 ml  Net -759 ml        Physical Exam: Vital Signs Blood pressure 116/76, pulse 78, temperature 97.8 F (36.6 C), temperature source Oral, resp. rate 20, height 5\' 4"  (1.626 m), weight 83 kg, SpO2 94 %.  General: No acute distress Mood and affect are appropriate Heart: Regular rate and rhythm no rubs murmurs or extra sounds Lungs: Clear to auscultation, breathing unlabored, no rales or wheezes Abdomen: Positive bowel sounds, soft nontender to palpation, nondistended Extremities: No clubbing, cyanosis, or edema Skin: No evidence of breakdown, no evidence of rash  Neurologic: Cranial nerves II through XII intact, motor strength is 4/5 in bilateral deltoid, bicep, tricep, grip, hip flexor, knee extensors, ankle dorsiflexor and plantar flexor  Cerebellar exam intention tremor vs mild dysmetria  finger to nose to finger as well as heel to shin in bilateral upper and lower extremities Musculoskeletal: Full range of motion in all 4 extremities. No joint swelling    Assessment/Plan: 1. Functional deficits which require 3+ hours per day of interdisciplinary therapy in a comprehensive inpatient rehab setting.  Physiatrist is providing close team supervision and 24 hour management of active medical problems listed below.  Physiatrist and rehab team continue to assess barriers to discharge/monitor patient  progress toward functional and medical goals  Care Tool:  Bathing    Body parts bathed by patient: Right arm,Left arm,Chest,Abdomen,Right upper leg,Left upper leg,Face,Left lower leg,Right lower leg,Front perineal area,Buttocks         Bathing assist Assist Level: Minimal Assistance - Patient > 75%     Upper Body Dressing/Undressing Upper body dressing   What is the patient wearing?: Pull over shirt    Upper body assist Assist Level: Minimal Assistance - Patient > 75%    Lower Body Dressing/Undressing Lower body dressing      What is the patient wearing?: Incontinence brief     Lower body assist Assist for lower body dressing: Moderate Assistance - Patient 50 - 74%     Toileting Toileting Toileting Activity did not occur and hygiene only): Refused  Toileting assist Assist for toileting: Maximal Assistance - Patient 25 - 49%     Transfers Chair/bed transfer  Transfers assist     Chair/bed transfer assist level: Moderate Assistance - Patient 50 - 74% Chair/bed transfer assistive device: Press photographer   Ambulation assist      Assist level: Maximal Assistance - Patient 25 - 49% Assistive device: Walker-rolling Max distance: 192ft   Walk 10 feet activity   Assist     Assist level: Moderate Assistance -  Patient - 50 - 74% Assistive device: Walker-rolling   Walk 50 feet activity   Assist    Assist level: Moderate Assistance - Patient - 50 - 74% Assistive device: Walker-rolling    Walk 150 feet activity   Assist Walk 150 feet activity did not occur: Safety/medical concerns         Walk 10 feet on uneven surface  activity   Assist Walk 10 feet on uneven surfaces activity did not occur: Safety/medical concerns         Wheelchair     Assist Will patient use wheelchair at discharge?: No (TBD)             Wheelchair 50 feet with 2 turns activity    Assist             Wheelchair 150 feet activity     Assist          Blood pressure 116/76, pulse 78, temperature 97.8 F (36.6 C), temperature source Oral, resp. rate 20, height 5\' 4"  (1.626 m), weight 83 kg, SpO2 94 %.  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 cardioembolic has loop recorder  -patient may shower -ELOS/Goals: 6/14. Supervision  -Continue CIR 2. Antithrombotics: -DVT/anticoagulation:Pharmaceutical:Lovenox -antiplatelet therapy: DAPT X 3 weeks followed by ASA alone. 3. Pain Management:Tylenol prn. -add voltaren gel for right 1st MTP jt pain -observe for pain tolerance with WB in therapy 4. Mood:LCSW to follow for evaluation and support. -chronic depression: wellbutrin SR 150mg  daily -antipsychotic agents: N/A 5. Neuropsych: This patientis somewhatcapable of making decisions on herown behalf. 6. Skin/Wound Care:Routine pressure relief measures. 7. Fluids/Electrolytes/Nutrition:Monitor I/O. Repeat lytes nl  8. HTN: Monitor BP tid. Avoid hypoperfusion.              -resume coreg 3.125mg  BID  Vitals:   02/08/21 1934 02/09/21 0545  BP: (!) 133/92 116/76  Pulse: 88 78  Resp: 20 20  Temp: 97.8 F (36.6 C) 97.8 F (36.6 C)  SpO2: 95% 94%  BP labile: continue to monitor.  9. T2DM: Hgb A1C-6.9. Monitor BS ac/hs and use SSI for elevated BS --Diet modification.  10. Dyslipidemia: Continue Zocor.  11. Hgb now back to normal range although on hi side for female  32. Intermittent hypokalemia: Recheck labs in am.              -potassium supplementation 13. Hypothyroid: On synthroid 112 mcg for supplement.  14. Constipation: On Miralax-->increase to bid              -continue senna-s qhs. 15. Chronic combined systolic/diastolic CHF:             -resuming coreg 3.125 and  lasix 40mg  bid (home meds) 16.  Urinary retention reviewed meds no anticholinergics, has not taken prn trazodone, likely immobility and ? Diabetic cystopathy although the Hgb A1 C is fairly low- this has improved UA + now on Keflex monitor UCx for result- >100K GNR. Resistant to cephazolin- changed to Macrobid on 6/3. F/u CBC on Monday. 5 day course planned  LOS: 8 days A FACE TO FACE EVALUATION WAS PERFORMED  02/09/2021, 8:01 AM

## 2021-02-09 NOTE — Progress Notes (Signed)
Pt refusing to be cathed at this time. PA Pam in to discuss with pt importance of fluids and urinary retention. Pt also refusing to drink water.  Mylo Red, LPN

## 2021-02-10 NOTE — Progress Notes (Signed)
Physical Therapy Weekly Progress Note  Patient Details  Name: Sue [name] MRN: [id number] Date of Birth: 15-Jun-1930  Beginning of progress report period: Feb 02, 2021 End of progress report period: February 10, 2021  Today's Date: 02/10/2021 PT Individual Time: 0900-0915 PT Individual Time Calculation (min): 15 min  and Today's Date: 02/10/2021 PT Missed Time: 45 Minutes Missed Time Reason: Patient fatigue;Patient unwilling to participate  Patient has met 0 of 4 short term goals.  Patient is making slow and minimal progress toward her goals. She has developed a UTI since admission to CIR, which has resulted in poor orientation and poor participation impacting her progress. She is grossly ModA/MaxA when she does participate in therapy.   Patient continues to demonstrate the following deficits muscle weakness, decreased cardiorespiratoy endurance, decreased motor planning, decreased initiation, decreased attention, decreased awareness, decreased problem solving, decreased safety awareness, decreased memory and delayed processing and decreased sitting balance, decreased standing balance, decreased postural control and decreased balance strategies and therefore will continue to benefit from skilled PT intervention to increase functional independence with mobility.  Patient progressing toward long term goals..  Continue plan of care.  PT Short Term Goals Week 1:  PT Short Term Goal 1 (Week 1): Pt will perform supine<>sit with CGA PT Short Term Goal 1 - Progress (Week 1): Progressing toward goal PT Short Term Goal 2 (Week 1): Pt will perform sit<>stands using LRAD with CGA PT Short Term Goal 2 - Progress (Week 1): Progressing toward goal PT Short Term Goal 3 (Week 1): Pt will perform stand pivot transfers using LRAD with CGA PT Short Term Goal 3 - Progress (Week 1): Progressing toward goal PT Short Term Goal 4 (Week 1): Pt will ambulate at least 29ft using LRAD with CGA PT Short Term Goal 4 -  Progress (Week 1): Progressing toward goal Week 2:  PT Short Term Goal 1 (Week 2): Patient will complete sit <> supine with MinA PT Short Term Goal 2 (Week 2): Patient will complete bed <> wc transfer with LRAD and MinA PT Short Term Goal 3 (Week 2): Patient will participate in at least 75% of her daily therapy sessions  Skilled Therapeutic Interventions/Progress Updates:    Patient received reclined in bed, asleep, but easy to wake. She denies pain. PT attempting to encourage patient to participate in therapy; however patient apparently not oriented to place, time or situation. PT attempting to gently orient patient using signs in her room; however, patient not receptive to orientation. Patient confused in her questions and statements making it more difficult to successfully orient patient. Patient repeatedly stating "they told me to rest, so I'm going to rest." Patient did not clarify who "they" was. RN made aware of patients disposition. Bed alarm on, call light within reach.   Therapy Documentation Precautions:  Precautions Precautions: Fall,Other (comment) Precaution Comments: pt reports 2 falls in past 1 year Restrictions Weight Bearing Restrictions: No  Therapy/Group: Individual Therapy  Karoline Caldwell, PT, DPT, CBIS  02/10/2021, 7:37 AM

## 2021-02-10 NOTE — Progress Notes (Signed)
PROGRESS NOTE   Subjective/Complaints:  Disorietned but able to reorient pt easily , reviewed labs   ROS- neg CP,no SOB, no N/V/D, +hard of hearing, denies pain  Objective:   No results found. Recent Labs    02/09/21 1023  WBC 9.6  HGB 17.2*  HCT 53.0*  PLT 666*   Recent Labs    02/09/21 1023  NA 135  K 3.5  CL 93*  CO2 28  GLUCOSE 146*  BUN 19  CREATININE 1.19*  CALCIUM 9.6    Intake/Output Summary (Last 24 hours) at 02/10/2021 0716 Last data filed at 02/09/2021 2305 Gross per 24 hour  Intake 560 ml  Output 400 ml  Net 160 ml        Physical Exam: Vital Signs Blood pressure 111/69, pulse 73, temperature 98 F (36.7 C), resp. rate 18, height 5\' 4"  (1.626 m), weight 83 kg, SpO2 (!) 87 %.  General: No acute distress Mood and affect are appropriate Heart: Regular rate and rhythm no rubs murmurs or extra sounds Lungs: Clear to auscultation, breathing unlabored, no rales or wheezes Abdomen: Positive bowel sounds, soft nontender to palpation, nondistended Extremities: No clubbing, cyanosis, or edema Skin: No evidence of breakdown, no evidence of rash  Neurologic: Cranial nerves II through XII intact, motor strength is 4/5 in bilateral deltoid, bicep, tricep, grip, hip flexor, knee extensors, ankle dorsiflexor and plantar flexor  Cerebellar exam intention tremor vs mild dysmetria  finger to nose to finger as well as heel to shin in bilateral upper and lower extremities Musculoskeletal: Full range of motion in all 4 extremities. No joint swelling    Assessment/Plan: 1. Functional deficits which require 3+ hours per day of interdisciplinary therapy in a comprehensive inpatient rehab setting.  Physiatrist is providing close team supervision and 24 hour management of active medical problems listed below.  Physiatrist and rehab team continue to assess barriers to discharge/monitor patient progress toward  functional and medical goals  Care Tool:  Bathing    Body parts bathed by patient: Right arm,Left arm,Chest,Abdomen,Right upper leg,Left upper leg,Face,Left lower leg,Right lower leg,Front perineal area,Buttocks         Bathing assist Assist Level: Minimal Assistance - Patient > 75%     Upper Body Dressing/Undressing Upper body dressing   What is the patient wearing?: Pull over shirt    Upper body assist Assist Level: Minimal Assistance - Patient > 75%    Lower Body Dressing/Undressing Lower body dressing      What is the patient wearing?: Incontinence brief     Lower body assist Assist for lower body dressing: Moderate Assistance - Patient 50 - 74%     Toileting Toileting Toileting Activity did not occur and hygiene only): Refused  Toileting assist Assist for toileting: Maximal Assistance - Patient 25 - 49%     Transfers Chair/bed transfer  Transfers assist     Chair/bed transfer assist level: Moderate Assistance - Patient 50 - 74% Chair/bed transfer assistive device: Press photographer assist      Assist level: Minimal Assistance - Patient > 75% Assistive device: Walker-rolling Max distance: 113   Walk 10 feet  activity   Assist     Assist level: Minimal Assistance - Patient > 75% Assistive device: Walker-rolling   Walk 50 feet activity   Assist    Assist level: Minimal Assistance - Patient > 75% Assistive device: Walker-rolling    Walk 150 feet activity   Assist Walk 150 feet activity did not occur: Safety/medical concerns  Assist level: Minimal Assistance - Patient > 75% Assistive device: Walker-rolling    Walk 10 feet on uneven surface  activity   Assist Walk 10 feet on uneven surfaces activity did not occur: Safety/medical concerns         Wheelchair     Assist Will patient use wheelchair at discharge?: No (TBD)             Wheelchair 50 feet with 2 turns  activity    Assist            Wheelchair 150 feet activity     Assist          Blood pressure 111/69, pulse 73, temperature 98 F (36.7 C), resp. rate 18, height 5\' 4"  (1.626 m), weight 83 kg, SpO2 (!) 87 %.  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 cardioembolic has loop recorder  -patient may shower -ELOS/Goals: 6/14. Supervision  -Continue CIR- cognitive deficits outweigh physical , has bilateral infarct , vascular dementia 2. Antithrombotics: -DVT/anticoagulation:Pharmaceutical:Lovenox -antiplatelet therapy: DAPT X 3 weeks followed by ASA alone. 3. Pain Management:Tylenol prn. -add voltaren gel for right 1st MTP jt pain -observe for pain tolerance with WB in therapy 4. Mood:LCSW to follow for evaluation and support. -chronic depression: wellbutrin SR 150mg  daily -antipsychotic agents: N/A 5. Neuropsych: This patientis somewhatcapable of making decisions on herown behalf. 6. Skin/Wound Care:Routine pressure relief measures. 7. Fluids/Electrolytes/Nutrition:Monitor I/O. Repeat lytes nl  8. HTN: Monitor BP tid. Avoid hypoperfusion.              -resume coreg 3.125mg  BID  Vitals:   02/09/21 1931 02/10/21 0457  BP: 117/73 111/69  Pulse: 77 73  Resp: 18 18  Temp: 98 F (36.7 C) 98 F (36.7 C)  SpO2: 91% (!) 87%  BP labile: continue to monitor.  9. T2DM: Hgb A1C-6.9. Monitor BS ac/hs and use SSI for elevated BS --Diet modification.  10. Dyslipidemia: Continue Zocor.  11. Hgb elevated, ? Polycythemia, plt up as well  12. Intermittent hypokalemia: Recheck labs in am.              -potassium supplementation- K+ 3.5 on 6/6 13. Hypothyroid: On synthroid 112 mcg for supplement.  14. Constipation: On Miralax-->increase to bid              -continue  senna-s qhs. 15. Chronic combined systolic/diastolic CHF:             -resuming coreg 3.125 and lasix 40mg  bid (home meds) 16.  Urinary retention reviewed meds no anticholinergics, has not taken prn trazodone, likely immobility and ? Diabetic cystopathy although the Hgb A1 C is fairly low- this has improved UA + now on Keflex monitor UCx for result- >100K GNR. Resistant to cephazolin- changed to Macrobid on 6/3. F/u CBC on Monday. 5 day course planned  LOS: 9 days A FACE TO FACE EVALUATION WAS PERFORMED  02/10/2021, 7:16 AM

## 2021-02-10 NOTE — Progress Notes (Signed)
Physical Therapy Session Note  Patient Details  Name: Sue [name] MRN: [id number] Date of Birth: 09-Oct-1929  Today's Date: 02/10/2021 PT Individual Time: 0940-7680 PT Individual Time Calculation (min): 28 min   and  Today's Date: 02/10/2021 PT Missed Time: 17 Minutes Missed Time Reason: Patient fatigue;Patient unwilling to participate  Short Term Goals: Week 2:  PT Short Term Goal 1 (Week 2): Patient will complete sit <> supine with MinA PT Short Term Goal 2 (Week 2): Patient will complete bed <> wc transfer with LRAD and MinA PT Short Term Goal 3 (Week 2): Patient will participate in at least 75% of her daily therapy sessions  Skilled Therapeutic Interventions/Progress Updates:    [place], RN requests therapist assist pt with attempt to void bladder.   Pt received sitting in w/c and requesting for assistance back to bed. With encouragement and education on importance of going to bathroom to attempt to void pt agreeable to therapy session. Sit>stand w/c>RW with min assist for balance - cuing to scoot forward and place 1 hand on RW and push up from w/c with other - pt demos decreased posterior lean today compared to all previous sessions with this therapist. Gait training ~62ft 2x in/out bathroom using RW with min assist for balance and AD management - pt continues to demo L anterolateral trunk lean (though slightly less) with forward flexed posturing and decreased B LE step lengths in shuffled pattern as well as poor AD management - cuing for improvement with minimal adjustments noted (overall pt's gait in the room environment is improving compared to last time working with this therapist). Standing with min assist for balance during mod assist for doffing LB clothing. Pt sat on BSC over toilet ~81minutes with water running and therapist attempting to provide light pressure on lower abdomen to stimulate bladder reflex with no success at voiding and pt stating she does not feel the urge. Standing  using R UE support on grab bar and L UE on RW with min assist for balance while pulling pants up over hips max assist. Pt requesting to return to bed. Sit>supine, HOB partially elevated and using bedrail, supervision. Therapist extensively educated pt on importance of participating in therapy sessions with reinforcement of prior education regarding therapy schedules and plan for team conference tomorrow to discuss POC, LOS, and evaluate estimated D/C date - pt asking multiple appropriate questions; however, this demonstrates poor recall/processing/understanding of information just provided - therapist reinforced 5x in different ways of the education provided. Pt continues to report she doesn't feel up to participating in therapy at this time. Pt left supine in bed with needs in reach and bed alarm on. RN notified of pt's unsuccessful voiding trial. Missed 17 minutes of skilled physical therapy.  Therapy Documentation Precautions:  Precautions Precautions: Fall,Other (comment) Precaution Comments: pt reports 2 falls in past 1 year Restrictions Weight Bearing Restrictions: No  Pain:   No reports of pain throughout session.  Therapy/Group: Individual Therapy  Ginny Forth , PT, DPT, CSRS  02/10/2021, 3:13 PM

## 2021-02-10 NOTE — Progress Notes (Signed)
Occupational Therapy Session Note  Patient Details  Name: Sue [name] MRN: [id number] Date of Birth: Sep 20, 1929  Today's Date: 02/10/2021 OT Individual Time: 7628-3151 OT Individual Time Calculation (min): 73 min    Short Term Goals: Week 1:  OT Short Term Goal 1 (Week 1): Pt will complete UB dressing with supervision for two consecutive sessions. OT Short Term Goal 1 - Progress (Week 1): Not met OT Short Term Goal 2 (Week 1): Pt will complete LB bathing sit to stand from wheelchair or shower seat with min guard assist. OT Short Term Goal 2 - Progress (Week 1): Not met OT Short Term Goal 3 (Week 1): Pt will complete LB dressing sit to stand with min assist. OT Short Term Goal 3 - Progress (Week 1): Not met OT Short Term Goal 4 (Week 1): Pt will complete toilet transfers with min guard assist using the RW for support. OT Short Term Goal 4 - Progress (Week 1): Not met  Skilled Therapeutic Interventions/Progress Updates:    Pt received on bedpan with NT present and consented to OT tx. Pt not having any luck on bedpan, instructed to sit EOB and transfer to [facility] with min A for toileting task. Pt able to have BM, req max A for hygiene and clothing mgmt. Pt then transferred to w/ with min A SPT, and completed shower SPT with grab bars and min A. Pt seen for bathing while seated on TTB in shower, req min A to wash buttocks and lower legs. Pt required cuing for bathing proficiency. After shower, pt completed SPT with min A and max cuing to turn all the way before sitting back down in w/c. Pt cued to dry thoroughly prior to dressing tasks. Pt taken to sink, completed oral hygiene with setup, and grooming with SUP. Pt req min A and cues to don shirt as pt put head through sleeve hole. Pt required max A to don briefs and pants, only able to thread RLE through pants, assist some with hiking when standing with min A. Instructed pt in use of sock aide for increased independence with footwear mgmt, pt  continued to require max A with sock, will continue to practice with AE. Remaining tim utilized to instruct in BUE strengthening HEP including chest press and shoulder press with 2# dowel rod for 3x15 with min cuing for proper technique with good carryover. After tx, pt left up in w/c with all needs met, seatbelt alarm on.  Therapy Documentation Precautions:  Precautions Precautions: Fall,Other (comment) Precaution Comments: pt reports 2 falls in past 1 year Restrictions Weight Bearing Restrictions: No Vital Signs: Therapy Vitals Temp: 98 F (36.7 C) Temp Source: Oral Pulse Rate: 72 Resp: 16 BP: 124/84 Patient Position (if appropriate): Lying Oxygen Therapy SpO2: 91 % O2 Device: Room Air Pain:  none   Therapy/Group: Individual Therapy  Karizma Cheek 02/10/2021, 3:02 PM

## 2021-02-11 MED ORDER — BETHANECHOL CHLORIDE 10 MG PO TABS
10.0000 mg | ORAL_TABLET | Freq: Three times a day (TID) | ORAL | Status: DC
  Administered 2021-02-11 – 2021-02-18 (×22): 10 mg via ORAL
  Filled 2021-02-11 (×22): qty 1

## 2021-02-11 MED ORDER — TAMSULOSIN HCL 0.4 MG PO CAPS
0.4000 mg | ORAL_CAPSULE | Freq: Every day | ORAL | Status: DC
  Administered 2021-02-11 – 2021-02-22 (×11): 0.4 mg via ORAL
  Filled 2021-02-11 (×11): qty 1

## 2021-02-11 NOTE — Progress Notes (Signed)
Occupational Therapy Session Note  Patient Details  Name: Sue [name] MRN: [id number] Date of Birth: 1930-03-22  Today's Date: 02/11/2021 OT Individual Time: 8546-2703 OT Individual Time Calculation (min): 58 min    Short Term Goals: Week 1:  OT Short Term Goal 1 (Week 1): Pt will complete UB dressing with supervision for two consecutive sessions. OT Short Term Goal 1 - Progress (Week 1): Not met OT Short Term Goal 2 (Week 1): Pt will complete LB bathing sit to stand from wheelchair or shower seat with min guard assist. OT Short Term Goal 2 - Progress (Week 1): Not met OT Short Term Goal 3 (Week 1): Pt will complete LB dressing sit to stand with min assist. OT Short Term Goal 3 - Progress (Week 1): Not met OT Short Term Goal 4 (Week 1): Pt will complete toilet transfers with min guard assist using the RW for support. OT Short Term Goal 4 - Progress (Week 1): Not met Week 2:  OT Short Term Goal 1 (Week 2): Pt will complete UB dressing with supervision for two consecutive sessions. OT Short Term Goal 2 (Week 2): Pt will complete LB bathing sit to stand from wheelchair or shower seat with min assist. OT Short Term Goal 3 (Week 2): Pt will complete LB dressing sit to stand with min assist. OT Short Term Goal 4 (Week 2): Pt will complete toilet transfers with min assist using the RW for support.  Skilled Therapeutic Interventions/Progress Updates:    Pt received in bed and consented to OT tx. Pt requested to use restroom, req mod A SPT with no device to transfer from EOB to w/c, min A to SPT from w/c to toilet with cuing to turn all the way before sitting. Pt demo's retrograde lean and decreased motor planning as she attempts to sit each time before making a complete pivot to destination. Pt req max A and cuing for clothing mgmt. Pt able to complete hygiene with cuing and encouragement as she demo's self-limiting behaviors. Pt says "aren't you going to clean me?" and "you do it" when instructed  to perform hygiene and don pants. Pt req max A for donning brief and pants. Pt then taken to sink via w/c where she required cuing for where the hand soap and paper towels were, and cuing to find the toothpaste for oral care.  Pt instructed in standing activity at elevated table with max cuing for upright posture with poor carryover. Pt instructed to place tacks in cork board with coordinating color reference card. Pt completed activity while standing for about 4 minutes, completed about 70% of activity with min cuing for accuracy, before requiring seated rest break to finish task. Once seated to complete activity, pt instructed to fwd sit to engage core muscles, requred max step-by-step cuing and instruction for proper and accurate completion, as pt demo's decreased executive functioning and problem solving abilities. Pt begins just placing tacks at random with zero regard for card reference. Pt instructed to pick up each tack and place back in bin for increased [facility]. After tx, pt taken back to room, left up in w/c with seatbelt alarm applied, call light in reach and all needs met.   Therapy Documentation Precautions:  Precautions Precautions: Fall,Other (comment) Precaution Comments: pt reports 2 falls in past 1 year Restrictions Weight Bearing Restrictions: No Pain: Pain Assessment Pain Scale: 0-10 Pain Score: 0-No pain   Therapy/Group: Individual Therapy  Seibert Keeter 02/11/2021, 9:09 AM

## 2021-02-11 NOTE — Progress Notes (Signed)
PROGRESS NOTE   Subjective/Complaints:  No issues overnite  Pt states she slept poorly due to hallway noise , is close to nsg station   ROS- neg CP,no SOB, no N/V/D, +hard of hearing, denies pain  Objective:   No results found. Recent Labs    02/09/21 1023  WBC 9.6  HGB 17.2*  HCT 53.0*  PLT 666*   Recent Labs    02/09/21 1023  NA 135  K 3.5  CL 93*  CO2 28  GLUCOSE 146*  BUN 19  CREATININE 1.19*  CALCIUM 9.6    Intake/Output Summary (Last 24 hours) at 02/11/2021 0729 Last data filed at 02/11/2021 0134 Gross per 24 hour  Intake 600 ml  Output 1225 ml  Net -625 ml        Physical Exam: Vital Signs Blood pressure 113/76, pulse 75, temperature 97.9 F (36.6 C), temperature source Oral, resp. rate 14, height $RemoveBe'5\' 4"'xlXEcFPUL$  (1.626 m), weight 83 kg, SpO2 90 %.  General: No acute distress Mood and affect are appropriate Heart: Regular rate and rhythm no rubs murmurs or extra sounds Lungs: Clear to auscultation, breathing unlabored, no rales or wheezes Abdomen: Positive bowel sounds, soft nontender to palpation, nondistended Extremities: No clubbing, cyanosis, or edema Skin: No evidence of breakdown, no evidence of rash  Neurologic: Cranial nerves II through XII intact, motor strength is 4/5 in bilateral deltoid, bicep, tricep, grip, hip flexor, knee extensors, ankle dorsiflexor and plantar flexor  Musculoskeletal: Full range of motion in all 4 extremities. No joint swelling    Assessment/Plan: 1. Functional deficits which require 3+ hours per day of interdisciplinary therapy in a comprehensive inpatient rehab setting.  Physiatrist is providing close team supervision and 24 hour management of active medical problems listed below.  Physiatrist and rehab team continue to assess barriers to discharge/monitor patient progress toward functional and medical goals  Care Tool:  Bathing    Body parts bathed by  patient: Right arm,Left arm,Chest,Abdomen,Right upper leg,Left upper leg,Face,Front perineal area     Body parts n/a: Right lower leg,Left lower leg,Buttocks   Bathing assist Assist Level: Minimal Assistance - Patient > 75% (seated in shower)     Upper Body Dressing/Undressing Upper body dressing   What is the patient wearing?: Pull over shirt    Upper body assist Assist Level: Minimal Assistance - Patient > 75%    Lower Body Dressing/Undressing Lower body dressing      What is the patient wearing?: Incontinence brief,Pants     Lower body assist Assist for lower body dressing: Maximal Assistance - Patient 25 - 49%     Toileting Toileting Toileting Activity did not occur (Clothing management and hygiene only): Refused  Toileting assist Assist for toileting: Maximal Assistance - Patient 25 - 49%     Transfers Chair/bed transfer  Transfers assist     Chair/bed transfer assist level: Minimal Assistance - Patient > 75% Chair/bed transfer assistive device: Walker,Armrests   Locomotion Ambulation   Ambulation assist      Assist level: Minimal Assistance - Patient > 75% Assistive device: Walker-rolling Max distance: 70ft   Walk 10 feet activity   Assist     Assist level:  Minimal Assistance - Patient > 75% Assistive device: Walker-rolling   Walk 50 feet activity   Assist    Assist level: Minimal Assistance - Patient > 75% Assistive device: Walker-rolling    Walk 150 feet activity   Assist Walk 150 feet activity did not occur: Safety/medical concerns  Assist level: Minimal Assistance - Patient > 75% Assistive device: Walker-rolling    Walk 10 feet on uneven surface  activity   Assist Walk 10 feet on uneven surfaces activity did not occur: Safety/medical concerns         Wheelchair     Assist Will patient use wheelchair at discharge?: No (TBD)             Wheelchair 50 feet with 2 turns activity    Assist             Wheelchair 150 feet activity     Assist          Blood pressure 113/76, pulse 75, temperature 97.9 F (36.6 C), temperature source Oral, resp. rate 14, height $RemoveBe'5\' 4"'ZVMcbHDDi$  (1.626 m), weight 83 kg, SpO2 90 %.  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 cardioembolic has loop recorder  -patient may shower -ELOS/Goals: 6/14. Supervision  -Continue CIR- Team conference today please see physician documentation under team conference tab, met with team  to discuss problems,progress, and goals. Formulized individual treatment plan based on medical history, underlying problem and comorbidities. 2. Antithrombotics: -DVT/anticoagulation:Pharmaceutical:Lovenox -antiplatelet therapy: DAPT X 3 weeks followed by ASA alone. 3. Pain Management:Tylenol prn. -add voltaren gel for right 1st MTP jt pain -observe for pain tolerance with WB in therapy 4. Mood:LCSW to follow for evaluation and support. -chronic depression: wellbutrin SR $RemoveBefor'150mg'qDyrOfTHYvzT$  daily -antipsychotic agents: N/A 5. Neuropsych: This patientis somewhatcapable of making decisions on herown behalf. 6. Skin/Wound Care:Routine pressure relief measures. 7. Fluids/Electrolytes/Nutrition:Monitor I/O. Repeat lytes nl  8. HTN: Monitor BP tid. Avoid hypoperfusion.              -resume coreg 3.$RemoveBeforeD'125mg'twovTkvITcKMfS$  BID  Vitals:   02/10/21 1923 02/11/21 0414  BP: 117/79 113/76  Pulse: 75 75  Resp: 18 14  Temp: 98.5 F (36.9 C) 97.9 F (36.6 C)  SpO2: 92% 90%  BP controlled 6/8 9. T2DM: Hgb A1C-6.9. Monitor BS ac/hs and use SSI for elevated BS --Diet modification.  10. Dyslipidemia: Continue Zocor.  11. Hgb elevated, ? Polycythemia, plt up as well  12. Intermittent hypokalemia: Recheck labs in am.              -potassium supplementation- K+ 3.5 on 6/6 13.  Hypothyroid: On synthroid 112 mcg for supplement.  14. Constipation: On Miralax-->increase to bid              -continue senna-s qhs. 15. Chronic combined systolic/diastolic CHF:             -resuming coreg 3.125 and lasix $RemoveB'40mg'qgQbjjJs$  bid (home meds) 16.  Urinary retention reviewed meds no anticholinergics, has not taken prn trazodone, likely immobility and ? Diabetic cystopathy although the Hgb A1 C is fairly low- this has improved UA + now on Keflex monitor UCx for result- >100K GNR. Resistant to cephazolin- changed to Macrobid on 6/3. F/u CBC on Monday. 5 day course planned  LOS: 10 days A FACE TO FACE EVALUATION WAS PERFORMED  Charlett Blake 02/11/2021, 7:29 AM

## 2021-02-11 NOTE — Patient Care Conference (Signed)
Inpatient RehabilitationTeam Conference and Plan of Care Update Date: 02/11/2021   Time: 10:57 AM    Patient Name: Sue [name]      Medical Record Number: [id number]  Date of Birth: Jun 16, 1930 Sex: Female         Room/Bed: 4M01C/4M01C-01 Payor Info: Payor: Arna Medici ADVANTAGE / Plan: [employer] HMO / Product Type: *No Product type* /    Admit Date/Time:  02/01/2021  3:56 PM  Primary Diagnosis:  Embolic stroke [facility])  Hospital Problems: Principal Problem:   Embolic stroke [facility])    Expected Discharge Date: Expected Discharge Date: 02/19/21  Team Members Present: Physician leading conference: Dr. Claudette Laws Care Coodinator Present: Chana Bode, RN, BSN, CRRN;Christina Dupuyer, BSW Nurse Present: Chana Bode, RN PT Present: Casimiro Needle, PT OT Present: Perrin Maltese, OT SLP Present: Elio Forget, SLP PPS Coordinator present : Fae Pippin, SLP     Current Status/Progress Goal Weekly Team Focus  Bowel/Bladder             Swallow/Nutrition/ Hydration             ADL's   Min assist for UB selfcare with max assist for LB selfcare.  Mod to max for stand pivot transfers with the RW.  Increased confusion and memory  supervision  selfcare retrianing, balance retraining, transfer training, neuromuscular re-education, cognitive retraining,  pt education   Mobility   ModA bed mobility, ModA/MaxA STS/ SPT using RW, minimal ambulation in past week due to very limited participation in therapy, decreasing orientation and increasing agitation also limiting participation in therapy/progress in therapy  CGA, will likely need to be downgraded  activity tolerance/participation, pt education, transfer training, gait tx, sitting/standing balance, motor planning, dc planning   Communication             Safety/Cognition/ Behavioral Observations            Pain             Skin               Discharge Planning:  Goal to d/c supervision to MOD I, lives with son    Team Discussion: Vascular dementia with bilateral infarcts and sundowning. Question cognitive status PTA as patient continues to demonstrate confusion, agitation (managed with seroquel), refusing therapy sessions, and poor carry over day to day learning/instructions. Oriented to month and year but gets caught up on location of clothes, why she got a  UTI, going home, etc. Staff unsure if learning will translate over and carry over to the home environment.  Patient on target to meet rehab goals: Currently supervision for upper body bathing and min assist for upper body dressing and mod - max assist for lower body care with female caregivers. Son unsure of how to manage toileting and bathing at home; uncomfortable assisting mother with these activities.   *See Care Plan and progress notes for long and short-term goals.   Revisions to Treatment Plan:  Use of pocker amplifier Downgraded goals from a supervision level  Teaching Needs: Transfers, toileting, medications, safety, etc.   Current Barriers to Discharge: Decreased caregiver support, Home enviroment access/layout and Behavior  Possible Resolutions to Barriers:  Family education with son Private duty caregivers to assist the son with care    Medical Summary Current Status: remains confused , treated for UTI, BP controlled, intermittent agitation  Barriers to Discharge: Behavior   Possible Resolutions to Barriers/Weekly Focus: added prn seroquel to scheduled dose   Continued Need for Acute Rehabilitation  Level of Care: The patient requires daily medical management by a physician with specialized training in physical medicine and rehabilitation for the following reasons: Direction of a multidisciplinary physical rehabilitation program to maximize functional independence : Yes Medical management of patient stability for increased activity during participation in an intensive rehabilitation regime.: Yes Analysis of laboratory values  and/or radiology reports with any subsequent need for medication adjustment and/or medical intervention. : Yes   I attest that I was present, lead the team conference, and concur with the assessment and plan of the team.   Chana Bode B 02/11/2021, 3:02 PM

## 2021-02-11 NOTE — Progress Notes (Signed)
Physical Therapy Session Note  Patient Details  Name: Sue [name] MRN: [id number] Date of Birth: 1930-04-07  Today's Date: 02/11/2021 PT Individual Time: 1103-1201 PT Individual Time Calculation (min): 58 min   Short Term Goals: Week 1:  PT Short Term Goal 1 (Week 1): Pt will perform supine<>sit with CGA PT Short Term Goal 1 - Progress (Week 1): Progressing toward goal PT Short Term Goal 2 (Week 1): Pt will perform sit<>stands using LRAD with CGA PT Short Term Goal 2 - Progress (Week 1): Progressing toward goal PT Short Term Goal 3 (Week 1): Pt will perform stand pivot transfers using LRAD with CGA PT Short Term Goal 3 - Progress (Week 1): Progressing toward goal PT Short Term Goal 4 (Week 1): Pt will ambulate at least 65ft using LRAD with CGA PT Short Term Goal 4 - Progress (Week 1): Progressing toward goal  Skilled Therapeutic Interventions/Progress Updates: Pt presents reclined in recliner and reluctantly agrees to therapy, as she states it is 1 and she is going home.  Pt transfers sit to stand from w/c w/ min A, but can require mod A if does not initiate forward scoot and lean.  Pt HOH and acts before any instructions.  Pt amb multiple trials w/ RW and min A up to 120' per trial including turns to return to seat.  Pt requires verbal cues for posture and maintaining position w/in RW.  Pt continues to have difficulty w/ turns and safe approach to seat, tending to attempt to sit down on L armrest w/o cueing.  Pt O2 sats remained > 96% on RA after activity, although noted increased respirations.  Pt performed LE there ex for calf raises, LAQ, hip flexion and standing hip flexion.  Pt tends to move too quickly, w/ LOB noted and required mod to max A to maintain.  Pt performed sit to stand 3 x 5 to RW and standing to PT.  Pt attempted overhead press w/ ball, but LOB, standing w/o UE support,but anxious.  Pt amb back to room w/ RW and verbal cues for walker management into recliner, verbal cues for  scooting back into recliner.  Handed off to NT and RN.     Therapy Documentation Precautions:  Precautions Precautions: Fall,Other (comment) Precaution Comments: pt reports 2 falls in past 1 year Restrictions Weight Bearing Restrictions: No General:   Vital Signs:   Pain:   Mobility:   Locomotion :    Trunk/Postural Assessment :    Balance:   Exercises:   Other Treatments:      Therapy/Group: Individual Therapy  Lucio Edward 02/11/2021, 12:13 PM

## 2021-02-11 NOTE — Progress Notes (Signed)
Speech Language Pathology Daily Session Note  Patient Details  Name: Sue [name] MRN: [id number] Date of Birth: 11-16-1929  Today's Date: 02/11/2021 SLP Individual Time: 1400-1450 SLP Individual Time Calculation (min): 50 min  Short Term Goals: Week 2: SLP Short Term Goal 1 (Week 2): Pt will complete mildly complex problem solving tasks for functional activities given min A verbal cues SLP Short Term Goal 2 (Week 2): Pt will demonstrate recall of novel and daily information with visual aid given min A verbal cues. SLP Short Term Goal 3 (Week 2): Pt will demonstrate self-monitoring and self-awareness of functional errors with min A verbal cues.  Skilled Therapeutic Interventions:   Patient seen for skilled ST session focusing on cognitive-linguistic goals. Patient initially was participating in cognitive assessment via the RIPA-G but participation quickly started to decline as patient questioning why SLP was asking her questions. She seemed to be under impression that testing was psychological in nature. She was oriented to month, year and place as well as approximate time of day. She was adamant that she had been here in the hospital "for months", repeatedly stating she has been hospitalized since October. SLP reviewed calendar with patient, noting planned date of discharge on 16th of May. Patient then fixated on wondering why discharge date was not 14th as had originally been planned. She was able to recall only general activities completed with PT. Patient continues to benefit from skilled SLP intervention to maximize cognitive-linguistic abilities prior to discharge.  Pain Pain Assessment Pain Scale: 0-10 Faces Pain Scale: No hurt Therapy/Group: Individual Therapy  Angela Nevin, MA, CCC-SLP Speech Therapy

## 2021-02-11 NOTE — Progress Notes (Signed)
Patient ID: Sue [name], female   DOB: [birth year], 87 y.o.   MRN: [id number] Team Conference Report to Patient/Family  Team Conference discussion was reviewed with the patient and caregiver, including goals, any changes in plan of care and target discharge date.  Patient and caregiver express understanding and are in agreement.  The patient has a target discharge date of 02/19/21.  SW made attempt to call pt son. Unable to leave VM, due to not being set up.  Andria Rhein 02/11/2021, 1:50 PM

## 2021-02-11 NOTE — Progress Notes (Signed)
Occupational Therapy Session Note  Patient Details  Name: Sue [name] MRN: [id number] Date of Birth: [birth year]  Today's Date: 02/11/2021 OT Individual Time: 4401-0272 OT Individual Time Calculation (min): 31 min    Short Term Goals: Week 2:  OT Short Term Goal 1 (Week 2): Pt will complete UB dressing with supervision for two consecutive sessions. OT Short Term Goal 2 (Week 2): Pt will complete LB bathing sit to stand from wheelchair or shower seat with min assist. OT Short Term Goal 3 (Week 2): Pt will complete LB dressing sit to stand with min assist. OT Short Term Goal 4 (Week 2): Pt will complete toilet transfers with min assist using the RW for support.  Skilled Therapeutic Interventions/Progress Updates:    Pt completed supine to sit EOB with min assist and performed sit to stand at the same level.  She needed mod assist for functional mobility to the door and back to the bed secondary to frequent LOB posteriorly and to the left.  Completed this X2 during session with pt exhibiting decreased awareness of her current situation.  She was able to transfer back to supine at min guard.  Therapist continued to explain to pt the change of discharge plan and rationale for the change.  Pt with no recollection of seeing me more that once or twice before with decreased awareness of her memory deficits or her need to stay in the hospital.  Repeatedly discussed that her son will need to arrange for 24 hour supervision in order for her to go home as she cannot be by herself.  If this cannot be arranged by her family, she may need to go to SNF for further rehab.  Emphasized that her family has to make the decision of what to do, that we only make recommendations to help keep her safe.  Finished session with pt in the bed and with the call button and phone in reach and safety belt in place.    Therapy Documentation Precautions:  Precautions Precautions: Fall,Other (comment) Precaution Comments: pt  reports 2 falls in past 1 year Restrictions Weight Bearing Restrictions: No  Pain: Pain Assessment Pain Scale: 0-10 Pain Score: 0-No pain Faces Pain Scale: No hurt ADL: See Care Tool Section for some details of mobility and selfcare  Therapy/Group: Individual Therapy  Mortimer Bair OTR/L 02/11/2021, 4:53 PM

## 2021-02-12 NOTE — Progress Notes (Signed)
Occupational Therapy Session Note  Patient Details  Name: Sue [name] MRN: [id number] Date of Birth: March 14, 1930  Today's Date: 02/12/2021 OT Individual Time: 1002-1057 OT Individual Time Calculation (min): 55 min    Short Term Goals: Week 2:  OT Short Term Goal 1 (Week 2): Pt will complete UB dressing with supervision for two consecutive sessions. OT Short Term Goal 2 (Week 2): Pt will complete LB bathing sit to stand from wheelchair or shower seat with min assist. OT Short Term Goal 3 (Week 2): Pt will complete LB dressing sit to stand with min assist. OT Short Term Goal 4 (Week 2): Pt will complete toilet transfers with min assist using the RW for support.  Skilled Therapeutic Interventions/Progress Updates:    Pt in wheelchair to start session.  Applied amplifier to assist with hearing and then worked on education with use of the sockaide for donning gripper socks.  Max demonstrational cueing for setup and use.  With progression to practicing tub shower transfers in the tub room with use of the tub bench.  She reports having a small shower seat at home, but states she is scared of falling.  Provided assist with rolling to the tub room where therapist demonstrated use of the tub bench and then had her complete transfer with mod assist and max instructional cueing.  She continues to need min assist for sit to stand with mod demonstrational cueing for hand placement with at least one hand on the surface that she is trying to stand from.  With transfer she continues to exhibit moderate posterior LOB with decreased ability to turn the shortest way to the surface she is trying to sit down on.  Mod demonstrational cueing needed to turn the easiest way.  Finished session with return to the room via wheelchair.  She was left sitting up with the call button and phone in reach and safety belt in place.      Therapy Documentation Precautions:  Precautions Precautions: Fall, Other (comment) Precaution  Comments: pt reports 2 falls in past 1 year Restrictions Weight Bearing Restrictions: No   Pain: Pain Assessment Pain Scale: Faces Pain Score: 0-No pain Faces Pain Scale: No hurt ADL: See Care Tool Section for some details of mobility and selfcare   Therapy/Group: Individual Therapy  Caoimhe Damron OTR/L 02/12/2021, 12:26 PM

## 2021-02-12 NOTE — Progress Notes (Signed)
Pt urinated without requiring catheterization. Pt denies any discomfort with urination. Urine amber in color. Encouraged pt to drink fluids.      02/12/21 1200  Output (mL)  Urine 150 mL  Unmeasured Output  Stool Occurrence 1  Urine Characteristics  Urinary Incontinence No  Urine Color Yellow/straw  Urine Appearance Clear  Urinary Interventions Bladder scan  Bladder Scan Volume (mL) 0 mL  Hygiene Peri care  Stool Characteristics  Bowel Incontinence No (started in brief but finished in toilet)  Stool Type Type 5 (Soft blobs with clear-cut edges)  Has the patient had three Type 7 stools in the last 24 hours? No  Stool Descriptors Brown  Stool Amount Medium  Stool Source Rectum     .me

## 2021-02-12 NOTE — Progress Notes (Signed)
Patient ID: Sue [name], female   DOB: [birth year], 87 y.o.   MRN: [id number]  SW made attempt to call pt son to provide conference updates. No answer, VM not set up!  [place], [place] [phone]

## 2021-02-12 NOTE — Progress Notes (Signed)
Physical Therapy Session Note  Patient Details  Name: Sue [name] MRN: [id number] Date of Birth: 1929/12/20  Today's Date: 02/12/2021 PT Individual Time: 0805-0920 PT Individual Time Calculation (min): 75 min   Short Term Goals: Week 2:  PT Short Term Goal 1 (Week 2): Patient will complete sit <> supine with MinA PT Short Term Goal 2 (Week 2): Patient will complete bed <> wc transfer with LRAD and MinA PT Short Term Goal 3 (Week 2): Patient will participate in at least 75% of her daily therapy sessions  Skilled Therapeutic Interventions/Progress Updates:    Pt received supine in bed and agreeable to therapy session. Reports need to use bathroom. Supine>sitting L EOB, HOB elevated and using bedrail, supervision for safety and cuing for sequencing with pt requiring increased time to scoot to EOB. Sit>stand EOB>RW, min assist for lifting and balance - requires cuing for safe hand placement using AD, pt is pulling backwards on RW less than in previous sessions with this therapist. Gait training ~30ft 2x to/from bathroom using RW with min assist for balance and 2x mod assist due to L posterior LOB - pt demos L lateral trunk flexion with excessive forward trunk flexion throughout gait. Cuing to turn fully and step R in front of BSC over toilet as pt with poor awareness of once she is in front of a seat. Standing with min assist for balance due to posterior lean during mod/max assist LB clothing management. Continent of bowels - total assist peri-care. Seated in w/c at sink performed hand hygiene.  Transported to/from gym in w/c for time management and energy conservation. Gait training ~52ft 2x using RW with min assist for balance - pt continues to demo excessive L anterior trunk flexion with excessively forward AD management - cuing for improvement but unable to sustain. When turning to sit in w/c at end of walk educated pt on looking back to find chair, feeling it on her legs, and then reaching back  with 1 UE - pt demos carryover of this education at end of 2nd walk.  Dynamic gait training in // bars including R/L lateral side stepping down/back 2x and backwards walking 4x - during side stepping: requires min assist for balance and demos heavy reliance on BUE support with continued excessive trunk forward flexion despite cuing for improvement - during backwards walking: starts with short, shuffled backwards steps and then progressed to step-to pattern leading with R LE and continues with excessive forward trunk flexion and very heavily relying on UE support to prevent posterior LOB Pt requires frequent seated rest breaks during session due to labored breathing and reports fatigue at end. Transported back to room in w/c. Pt inquiring about D/C plan - therapist utilized calendar provided by SLP to orient patient to today's date with plan for D/C on 6/16 in order to allow family education/training. Pt states her and her son have discuss possible need for her to go to a SNF and therapist educated that the need for that will also be discussed during family education/training. Pt left sitting in w/c with needs in reach and seat belt alarm on.  Therapy Documentation Precautions:  Precautions Precautions: Fall, Other (comment) Precaution Comments: pt reports 2 falls in past 1 year Restrictions Weight Bearing Restrictions: No  Pain:  Denies pain during session.   Therapy/Group: Individual Therapy  Ginny Forth , PT, DPT, CSRS 02/12/2021, 7:50 AM

## 2021-02-12 NOTE — Progress Notes (Signed)
Occupational Therapy Session Note  Patient Details  Name: Sue [name] MRN: [id number] Date of Birth: May 28, 1930  Today's Date: 02/12/2021 OT Individual Time: 3149-7026 OT Individual Time Calculation (min): 23 min    Short Term Goals: Week 2:  OT Short Term Goal 1 (Week 2): Pt will complete UB dressing with supervision for two consecutive sessions. OT Short Term Goal 2 (Week 2): Pt will complete LB bathing sit to stand from wheelchair or shower seat with min assist. OT Short Term Goal 3 (Week 2): Pt will complete LB dressing sit to stand with min assist. OT Short Term Goal 4 (Week 2): Pt will complete toilet transfers with min assist using the RW for support.  Skilled Therapeutic Interventions/Progress Updates:    Pt received seated in recliner, denies pain, agreeable to therapy. Pocket talker donned throughout session. Session focus on BLE strengthening, orientation, and functional mobility in prep for improved ADL performance. LPN in/out for medication administration. Pt able to accurately state that she was in [facility] post stroke and that if affected her walking. Additionally, correctly states month, year, and was one day off for day of the week. Completed x3 STS with and without AD. Req overall mod A to power up and prevent posterior lean, performs better with RW to facilitate anterior WS. Stand-pivot mod A with RW to bed. Returned to supine min A to progress LLE onto bed. Completed 2x10 leg raises, heel slides, and bridges. Pt able to ind recall that she did "leg raises" and sets of 10 when prompted to help OT write in SLP memory notebook. Req mod Vcs to recall that she returned to bed, as well.  Pt left semi-reclined in bed with bed alarm engaged, call bell in reach, and all immediate needs met.    Therapy Documentation Precautions:  Precautions Precautions: Fall, Other (comment) Precaution Comments: pt reports 2 falls in past 1 year Restrictions Weight Bearing  Restrictions: No   Pain: denies   ADL: See Care Tool for more details.  Therapy/Group: Individual Therapy  Volanda Napoleon MS, OTR/L   02/12/2021, 6:38 AM

## 2021-02-12 NOTE — Progress Notes (Signed)
Patient ID: Sue [name], female   DOB: 08-Aug-1930, 87 y.o.   MRN: [id number]  [facility] options provided to pt.   [place], [place] [phone]

## 2021-02-12 NOTE — Progress Notes (Signed)
Speech Language Pathology Daily Session Note  Patient Details  Name: Sue [name] MRN: [id number] Date of Birth: 1929-12-01  Today's Date: 02/12/2021 SLP Individual Time: 1115-1200 SLP Individual Time Calculation (min): 45 min  Short Term Goals: Week 2: SLP Short Term Goal 1 (Week 2): Pt will complete mildly complex problem solving tasks for functional activities given min A verbal cues SLP Short Term Goal 2 (Week 2): Pt will demonstrate recall of novel and daily information with visual aid given min A verbal cues. SLP Short Term Goal 3 (Week 2): Pt will demonstrate self-monitoring and self-awareness of functional errors with min A verbal cues.  Skilled Therapeutic Interventions: Skilled SLP intervention focused on cognition. Pt shown how to use telephone in room to call family. Attempted to have pt call son however she refused stating he was downstairs in the hospital meeting with someone. Pt able to recall purpose of 30% of medications when given the name and able to recall medications she was taking prior to hospital admission and reason she is taking medication for bladder with mod A. Min- Mod A required to orient to month, today's date and dc date using calendar in room. Pt requested to call son at end of session to let him know she is done with therapy. Pt dialed number when called aloud by slp with supervision A. Son stated he was not at hospital and plans to come after lunch. Pt informed. Pt left seated upright in chair with chair alarm set and call button within reach. Cont with therapy per plan of care.      Pain Pain Assessment Pain Scale: Faces Pain Score: 0-No pain Faces Pain Scale: No hurt  Therapy/Group: Individual Therapy  Amil Amen A Inda Mcglothen 02/12/2021, 11:45 AM

## 2021-02-12 NOTE — Progress Notes (Signed)
PROGRESS NOTE   Subjective/Complaints:  Sleeping but awakens to voice   ROS- neg CP,no SOB, no N/V/D, +hard of hearing, denies pain  Objective:   No results found. Recent Labs    02/09/21 1023  WBC 9.6  HGB 17.2*  HCT 53.0*  PLT 666*    Recent Labs    02/09/21 1023  NA 135  K 3.5  CL 93*  CO2 28  GLUCOSE 146*  BUN 19  CREATININE 1.19*  CALCIUM 9.6     Intake/Output Summary (Last 24 hours) at 02/12/2021 0714 Last data filed at 02/12/2021 0615 Gross per 24 hour  Intake 834 ml  Output 1150 ml  Net -316 ml         Physical Exam: Vital Signs Blood pressure 117/68, pulse 70, temperature 97.8 F (36.6 C), temperature source Oral, resp. rate 16, height 5\' 4"  (1.626 m), weight 83 kg, SpO2 90 %.  General: No acute distress Mood and affect are appropriate Heart: Regular rate and rhythm no rubs murmurs or extra sounds Lungs: Clear to auscultation, breathing unlabored, no rales or wheezes Abdomen: Positive bowel sounds, soft nontender to palpation, nondistended Extremities: No clubbing, cyanosis, or edema Skin: No evidence of breakdown, no evidence of rash   Neurologic: Cranial nerves II through XII intact, motor strength is 4/5 in bilateral deltoid, bicep, tricep, grip, hip flexor, knee extensors, ankle dorsiflexor and plantar flexor  Musculoskeletal: Full range of motion in all 4 extremities. No joint swelling    Assessment/Plan: 1. Functional deficits which require 3+ hours per day of interdisciplinary therapy in a comprehensive inpatient rehab setting. Physiatrist is providing close team supervision and 24 hour management of active medical problems listed below. Physiatrist and rehab team continue to assess barriers to discharge/monitor patient progress toward functional and medical goals  Care Tool:  Bathing    Body parts bathed by patient: Right arm, Left arm, Chest, Abdomen, Right upper leg, Left  upper leg, Face, Front perineal area     Body parts n/a: Right lower leg, Left lower leg, Buttocks   Bathing assist Assist Level: Minimal Assistance - Patient > 75% (seated in shower)     Upper Body Dressing/Undressing Upper body dressing   What is the patient wearing?: Pull over shirt    Upper body assist Assist Level: Minimal Assistance - Patient > 75%    Lower Body Dressing/Undressing Lower body dressing      What is the patient wearing?: Incontinence brief, Pants     Lower body assist Assist for lower body dressing: Maximal Assistance - Patient 25 - 49%     Toileting Toileting Toileting Activity did not occur and hygiene only): Refused  Toileting assist Assist for toileting: Maximal Assistance - Patient 25 - 49%     Transfers Chair/bed transfer  Transfers assist     Chair/bed transfer assist level: Minimal Assistance - Patient > 75% Chair/bed transfer assistive device: Walker, Press photographer   Ambulation assist      Assist level: Moderate Assistance - Patient 50 - 74% Assistive device: Walker-rolling Max distance: 20'   Walk 10 feet activity   Assist     Assist level:  Minimal Assistance - Patient > 75% Assistive device: Walker-rolling   Walk 50 feet activity   Assist    Assist level: Minimal Assistance - Patient > 75% Assistive device: Walker-rolling    Walk 150 feet activity   Assist Walk 150 feet activity did not occur: Safety/medical concerns  Assist level: Minimal Assistance - Patient > 75% Assistive device: Walker-rolling    Walk 10 feet on uneven surface  activity   Assist Walk 10 feet on uneven surfaces activity did not occur: Safety/medical concerns         Wheelchair     Assist Will patient use wheelchair at discharge?: No (TBD)             Wheelchair 50 feet with 2 turns activity    Assist            Wheelchair 150 feet activity     Assist           Blood pressure 117/68, pulse 70, temperature 97.8 F (36.6 C), temperature source Oral, resp. rate 16, height 5\' 4"  (1.626 m), weight 83 kg, SpO2 90 %.  Medical Problem List and Plan: 1.  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 cardioembolic has loop recorder              -patient may shower             -ELOS/Goals: 6/14. Supervision  -Continue CIR- PT, OT, SLP 2.  Antithrombotics: -DVT/anticoagulation:  Pharmaceutical: Lovenox             -antiplatelet therapy: DAPT X 3 weeks followed by ASA alone.  3. Pain Management: Tylenol prn.              -add voltaren gel for right 1st MTP jt pain             -observe for pain tolerance with WB in therapy 4. Mood: LCSW to follow for evaluation and support.              -chronic depression: wellbutrin SR 150mg  daily             -antipsychotic agents: N/A 5. Neuropsych: This patient is somewhat capable of making decisions on her own behalf. 6. Skin/Wound Care: Routine pressure relief measures.  7. Fluids/Electrolytes/Nutrition: Monitor I/O. Repeat lytes nl  8. HTN: Monitor BP tid. Avoid hypoperfusion.              -resume coreg 3.125mg  BID  Vitals:   02/11/21 1922 02/12/21 0713  BP: 115/64 117/68  Pulse: 74 70  Resp: 18 16  Temp: 98.6 F (37 C) 97.8 F (36.6 C)  SpO2: 92% 90%  BP controlled 6/9 9. T2DM: Hgb A1C-6.9. Monitor BS ac/hs and use SSI for elevated BS             --Diet modification.  10. Dyslipidemia: Continue Zocor.  11. Hgb elevated, ? Polycythemia, plt up as well  12. Intermittent hypokalemia: Recheck labs in am.              -potassium supplementation- K+ 3.5 on 6/6 13.  Hypothyroid: On synthroid 112 mcg for supplement.  14. Constipation: On Miralax-->increase to bid              -continue senna-s qhs.  15. Chronic combined systolic/diastolic CHF:             -resuming coreg 3.125 and lasix 40mg  bid (home meds) 16.  Urinary retention reviewed  meds  no anticholinergics, has not taken prn trazodone, likely immobility and ? Diabetic cystopathy although the Hgb A1 C is fairly low- this has improved UA + now on Keflex monitor UCx for result- >100K GNR. Resistant to cephazolin- changed to Macrobid on 6/3. F/u CBC on Monday. 5 day course planned  LOS: 11 days A FACE TO FACE EVALUATION WAS PERFORMED  Erick Colace 02/12/2021, 7:14 AM

## 2021-02-13 MED ORDER — POLYETHYLENE GLYCOL 3350 17 G PO PACK
17.0000 g | PACK | Freq: Every day | ORAL | Status: DC
  Administered 2021-02-14 – 2021-02-23 (×6): 17 g via ORAL
  Filled 2021-02-13 (×9): qty 1

## 2021-02-13 MED ORDER — SENNOSIDES-DOCUSATE SODIUM 8.6-50 MG PO TABS
1.0000 | ORAL_TABLET | Freq: Two times a day (BID) | ORAL | Status: DC
  Administered 2021-02-13 – 2021-02-23 (×17): 1 via ORAL
  Filled 2021-02-13 (×21): qty 1

## 2021-02-13 NOTE — Progress Notes (Signed)
Physical Therapy Session Note  Patient Details  Name: Sue [name] MRN: [id number] Date of Birth: 1929/09/26  Today's Date: 02/13/2021 PT Individual Time: 1001-1111 PT Individual Time Calculation (min): 70 min   Short Term Goals: Week 2:  PT Short Term Goal 1 (Week 2): Patient will complete sit <> supine with MinA PT Short Term Goal 2 (Week 2): Patient will complete bed <> wc transfer with LRAD and MinA PT Short Term Goal 3 (Week 2): Patient will participate in at least 75% of her daily therapy sessions  Skilled Therapeutic Interventions/Progress Updates:    Patient received sitting up in bed, agreeable to PT. She denies pain. Patient requesting to use bathroom. She was able to come sit edge of bed with MinA and verbal cues for sequencing. MaxA stand pivot to wc with significant posterior bias. Patient transferring to [facility] over toilet via stand pivot with ModA and use of grab bar. Patient able to doff brief with max verbal cues. Patient continent of bladder and small bowel movement. TotalA for perihygiene in standing with ModA/MaxA to facilitate upright posture. Stand pivot back to wc. RN present for bladder scan after successful void. PT transporting patient in wc to therapy gym for time management and energy conservation. Standing dynamic balance task placing clothes pins on stand with table placed slightly in front of patient to encourage further anterior weight shift. Patient requiring MaxA in standing to facilitate appropriate anterior weight shift. Patient with generally low tolerance to frustration for this task, consistently trying to pull table close to herself. Patient also moving feet further anteriorly resulting in increased posterior lean. Patient without toes/forefoot on ground majority of this task. PT placed wedge under patients feet to further encourage and force anterior weight shift, however patient continued to just move her feet up the wedge resulting in increased posterior lean.  Patient refusing to participate in further standing tasks, but agreeable to continue therapy. Anterior ball roll outs  3x15 completed seated with physioball to encourage that anterior weight shift. Patient able to complete good ROM with this, but again, toes/forefeet not on the ground. Patient completing tasks on the BITS seated with screen slightly out of her reach to further encourage anterior weight shift reaching slightly outside BOS. Patient able to do so comfortably. She did have difficulty with moderately complex cog tasks and memory tasks. Appropriate visual scanning when completing a dot task noted, but patient with extreme difficulty completing bell cancellation task missing ~50% of the bells on the screen. Patient returning to room in wc, transferring to recliner via stand pivot with ModA/MaxA. Seatbelt alarm on, call light within reach.   Therapy Documentation Precautions:  Precautions Precautions: Fall, Other (comment) Precaution Comments: pt reports 2 falls in past 1 year Restrictions Weight Bearing Restrictions: No    Therapy/Group: Individual Therapy  Elizebeth Koller, PT, DPT, CBIS  02/13/2021, 7:36 AM

## 2021-02-13 NOTE — Progress Notes (Signed)
Speech Language Pathology Daily Session Note  Patient Details  Name: Sue [name] MRN: [id number] Date of Birth: 02-17-1930  Today's Date: 02/13/2021 SLP Individual Time: 0730-0810 SLP Individual Time Calculation (min): 40 min  Short Term Goals: Week 2: SLP Short Term Goal 1 (Week 2): Pt will complete mildly complex problem solving tasks for functional activities given min A verbal cues SLP Short Term Goal 2 (Week 2): Pt will demonstrate recall of novel and daily information with visual aid given min A verbal cues. SLP Short Term Goal 3 (Week 2): Pt will demonstrate self-monitoring and self-awareness of functional errors with min A verbal cues.  Skilled Therapeutic Interventions: Skilled treatment session focused on cognitive goals. Upon arrival, patient was awake in bed and agreeable to participate. Patient was independently oriented to place and situation but required Max verbal and visual cues for use of calendar for orientation to date. Patient participated in basic problem solving task of assembling a large calendar for the SLP office. Mod verbal cues were needed for attention and problem solving (sequencing numbers and placing them in the right now) throughout task. SLP asked if there were any "special days" in June and patient independently recalled the date and name for "flag day." SLP recorded events in memory notebook and rewrote her schedule to maximize utilization but patient continued to require Max A for use. Patient left upright in bed with alarm on and all needs within reach. Continue with current plan of care.      Pain Pain Assessment Pain Scale: 0-10 Pain Score: 0-No pain  Therapy/Group: Individual Therapy  Jayden Rudge 02/13/2021, 10:42 AM
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
## 2021-02-13 NOTE — Progress Notes (Signed)
PROGRESS NOTE   Subjective/Complaints:  No issues overnite , last cath 24h ago, stools are loose, BM x 3 yesterday   ROS- neg CP,no SOB, no N/V/D, +hard of hearing, denies pain  Objective:   No results found. No results for input(s): WBC, HGB, HCT, PLT in the last 72 hours.  No results for input(s): NA, K, CL, CO2, GLUCOSE, BUN, CREATININE, CALCIUM in the last 72 hours.   Intake/Output Summary (Last 24 hours) at 02/13/2021 0175 Last data filed at 02/12/2021 2034 Gross per 24 hour  Intake 1300 ml  Output 200 ml  Net 1100 ml         Physical Exam: Vital Signs Blood pressure 120/89, pulse 87, temperature 98 F (36.7 C), resp. rate 18, height 5\' 4"  (1.626 m), weight 83 kg, SpO2 92 %.  General: No acute distress Mood and affect are appropriate Heart: Regular rate and rhythm no rubs murmurs or extra sounds Lungs: Clear to auscultation, breathing unlabored, no rales or wheezes Abdomen: Positive bowel sounds, soft nontender to palpation, nondistended Extremities: No clubbing, cyanosis, or edema Skin: No evidence of breakdown, no evidence of rash  Neurologic: Cranial nerves II through XII intact, motor strength is 4/5 in bilateral deltoid, bicep, tricep, grip, hip flexor, knee extensors, ankle dorsiflexor and plantar flexor  Musculoskeletal: Full range of motion in all 4 extremities. No joint swelling    Assessment/Plan: 1. Functional deficits which require 3+ hours per day of interdisciplinary therapy in a comprehensive inpatient rehab setting. Physiatrist is providing close team supervision and 24 hour management of active medical problems listed below. Physiatrist and rehab team continue to assess barriers to discharge/monitor patient progress toward functional and medical goals  Care Tool:  Bathing    Body parts bathed by patient: Right arm, Left arm, Chest, Abdomen, Right upper leg, Left upper leg, Face, Front  perineal area     Body parts n/a: Right lower leg, Left lower leg, Buttocks   Bathing assist Assist Level: Minimal Assistance - Patient > 75% (seated in shower)     Upper Body Dressing/Undressing Upper body dressing   What is the patient wearing?: Pull over shirt    Upper body assist Assist Level: Minimal Assistance - Patient > 75%    Lower Body Dressing/Undressing Lower body dressing      What is the patient wearing?: Incontinence brief, Pants     Lower body assist Assist for lower body dressing: Maximal Assistance - Patient 25 - 49%     Toileting Toileting Toileting Activity did not occur and hygiene only): Refused  Toileting assist Assist for toileting: Maximal Assistance - Patient 25 - 49%     Transfers Chair/bed transfer  Transfers assist     Chair/bed transfer assist level: Minimal Assistance - Patient > 75% Chair/bed transfer assistive device: Walker, Press photographer   Ambulation assist      Assist level: Minimal Assistance - Patient > 75% Assistive device: Walker-rolling Max distance: 66ft   Walk 10 feet activity   Assist     Assist level: Minimal Assistance - Patient > 75% Assistive device: Walker-rolling   Walk 50 feet activity   Assist  Assist level: Minimal Assistance - Patient > 75% Assistive device: Walker-rolling    Walk 150 feet activity   Assist Walk 150 feet activity did not occur: Safety/medical concerns  Assist level: Minimal Assistance - Patient > 75% Assistive device: Walker-rolling    Walk 10 feet on uneven surface  activity   Assist Walk 10 feet on uneven surfaces activity did not occur: Safety/medical concerns         Wheelchair     Assist Will patient use wheelchair at discharge?: No (TBD)             Wheelchair 50 feet with 2 turns activity    Assist            Wheelchair 150 feet activity     Assist          Blood pressure  120/89, pulse 87, temperature 98 F (36.7 C), resp. rate 18, height 5\' 4"  (1.626 m), weight 83 kg, SpO2 92 %.  Medical Problem List and Plan: 1.  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 cardioembolic has loop recorder              -patient may shower             -ELOS/Goals: 6/14. Supervision  -Continue CIR- PT, OT, SLP 2.  Antithrombotics: -DVT/anticoagulation:  Pharmaceutical: Lovenox             -antiplatelet therapy: DAPT X 3 weeks followed by ASA alone.  3. Pain Management: Tylenol prn.              -add voltaren gel for right 1st MTP jt pain             -observe for pain tolerance with WB in therapy 4. Mood: LCSW to follow for evaluation and support.              -chronic depression: wellbutrin SR 150mg  daily             -antipsychotic agents: N/A 5. Neuropsych: This patient is somewhat capable of making decisions on her own behalf. 6. Skin/Wound Care: Routine pressure relief measures.  7. Fluids/Electrolytes/Nutrition: Monitor I/O. Repeat lytes nl  8. HTN: Monitor BP tid. Avoid hypoperfusion.              -resume coreg 3.125mg  BID  Vitals:   02/12/21 1929 02/13/21 0400  BP: 114/76 120/89  Pulse: 75 87  Resp: 17 18  Temp: 98.1 F (36.7 C) 98 F (36.7 C)  SpO2: 92% 92%  BP controlled 6/10 9. T2DM: Hgb A1C-6.9. Monitor BS ac/hs and use SSI for elevated BS             --Diet modification.  10. Dyslipidemia: Continue Zocor.  11. Hgb elevated, ? Polycythemia, plt up as well  12. Intermittent hypokalemia: Recheck labs in am.              -potassium supplementation- K+ 3.5 on 6/6 13.  Hypothyroid: On synthroid 112 mcg for supplement.  14. Constipation: On Miralax-->increase to bid              -continue senna-s qhs.  15. Chronic combined systolic/diastolic CHF:             -resuming coreg 3.125 and lasix 40mg  bid (home meds) 16.  Urinary retention improved with urecholine and flomax  likely immobility and ?  Diabetic cystopathy although the Hgb A1 C is fairly low- completed macrobid for UTI  LOS: 12 days A FACE TO FACE EVALUATION WAS PERFORMED  Sue [name] 02/13/2021, 6:52 AM

## 2021-02-14 DIAGNOSIS — R410 Disorientation, unspecified: Secondary | ICD-10-CM

## 2021-02-14 DIAGNOSIS — H44002 Unspecified purulent endophthalmitis, left eye: Secondary | ICD-10-CM

## 2021-02-14 MED ORDER — POLYMYXIN B-TRIMETHOPRIM 10000-0.1 UNIT/ML-% OP SOLN
1.0000 [drp] | OPHTHALMIC | Status: DC
  Administered 2021-02-14 – 2021-02-17 (×14): 1 [drp] via OPHTHALMIC
  Filled 2021-02-14: qty 10

## 2021-02-14 NOTE — Progress Notes (Signed)
PROGRESS NOTE   Subjective/Complaints:  Per c hart, pt very confused- wants police called, because she has "been kidnapped".   Sleeping, but woke briefly to say no issues.  Per PT_ L eye draining- looks red.     ROS- limited secondary to sedation/cognition  Objective:   No results found. No results for input(s): WBC, HGB, HCT, PLT in the last 72 hours.  No results for input(s): NA, K, CL, CO2, GLUCOSE, BUN, CREATININE, CALCIUM in the last 72 hours.   Intake/Output Summary (Last 24 hours) at 02/14/2021 1413 Last data filed at 02/14/2021 0842 Gross per 24 hour  Intake 100 ml  Output --  Net 100 ml        Physical Exam: Vital Signs Blood pressure 129/79, pulse 79, temperature 97.9 F (36.6 C), resp. rate 17, height 5\' 4"  (1.626 m), weight 83 kg, SpO2 96 %.   General: asleep; woke briefly, in bed; NAD HENT: L eye more closed; slightly draining- mild erythema; oropharynx moist CV: regular rate; no JVD Pulmonary: CTA B/L; no W/R/R- good air movement GI: soft, NT, ND, (+)BS Psychiatric: sleepy/lethargic Neurological: lethargic.    Neurologic: Cranial nerves II through XII intact, motor strength is 4/5 in bilateral deltoid, bicep, tricep, grip, hip flexor, knee extensors, ankle dorsiflexor and plantar flexor  Musculoskeletal: Full range of motion in all 4 extremities. No joint swelling    Assessment/Plan: 1. Functional deficits which require 3+ hours per day of interdisciplinary therapy in a comprehensive inpatient rehab setting. Physiatrist is providing close team supervision and 24 hour management of active medical problems listed below. Physiatrist and rehab team continue to assess barriers to discharge/monitor patient progress toward functional and medical goals  Care Tool:  Bathing    Body parts bathed by patient: Right arm, Left arm, Chest, Abdomen, Right upper leg, Left upper leg, Face, Front perineal  area     Body parts n/a: Right lower leg, Left lower leg, Buttocks   Bathing assist Assist Level: Minimal Assistance - Patient > 75% (seated in shower)     Upper Body Dressing/Undressing Upper body dressing   What is the patient wearing?: Pull over shirt    Upper body assist Assist Level: Minimal Assistance - Patient > 75%    Lower Body Dressing/Undressing Lower body dressing      What is the patient wearing?: Incontinence brief, Pants     Lower body assist Assist for lower body dressing: Maximal Assistance - Patient 25 - 49%     Toileting Toileting Toileting Activity did not occur and hygiene only): Refused  Toileting assist Assist for toileting: Maximal Assistance - Patient 25 - 49%     Transfers Chair/bed transfer  Transfers assist     Chair/bed transfer assist level: Minimal Assistance - Patient > 75% Chair/bed transfer assistive device: Armrests, Press photographer   Ambulation assist      Assist level: Minimal Assistance - Patient > 75% Assistive device: Walker-rolling Max distance: 168ft   Walk 10 feet activity   Assist     Assist level: Minimal Assistance - Patient > 75% Assistive device: Walker-rolling   Walk 50 feet activity   Assist  Assist level: Minimal Assistance - Patient > 75% Assistive device: Walker-rolling    Walk 150 feet activity   Assist Walk 150 feet activity did not occur: Safety/medical concerns  Assist level: Minimal Assistance - Patient > 75% Assistive device: Walker-rolling    Walk 10 feet on uneven surface  activity   Assist Walk 10 feet on uneven surfaces activity did not occur: Safety/medical concerns         Wheelchair     Assist Will patient use wheelchair at discharge?: No (TBD)             Wheelchair 50 feet with 2 turns activity    Assist            Wheelchair 150 feet activity     Assist          Blood pressure 129/79, pulse  79, temperature 97.9 F (36.6 C), resp. rate 17, height 5\' 4"  (1.626 m), weight 83 kg, SpO2 96 %.  Medical Problem List and Plan: 1.  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 cardioembolic has loop recorder              -patient may shower             -ELOS/Goals: 6/14. Supervision  -Con't PT, OT and SLP 2.  Antithrombotics: -DVT/anticoagulation:  Pharmaceutical: Lovenox             -antiplatelet therapy: DAPT X 3 weeks followed by ASA alone.  3. Pain Management: Tylenol prn.              -add voltaren gel for right 1st MTP jt pain             -observe for pain tolerance with WB in therapy 4. Mood: LCSW to follow for evaluation and support.              -chronic depression: wellbutrin SR 150mg  daily             -antipsychotic agents: N/A 5. Neuropsych: This patient is somewhat capable of making decisions on her own behalf. 6. Skin/Wound Care: Routine pressure relief measures.  7. Fluids/Electrolytes/Nutrition: Monitor I/O. Repeat lytes nl  8. HTN: Monitor BP tid. Avoid hypoperfusion.              -resume coreg 3.125mg  BID  Vitals:   02/14/21 0535 02/14/21 1342  BP: 120/69 129/79  Pulse: 75 79  Resp: 18 17  Temp: 98.5 F (36.9 C) 97.9 F (36.6 C)  SpO2: 93% 96%  BP controlled 6/10 9. T2DM: Hgb A1C-6.9. Monitor BS ac/hs and use SSI for elevated BS             --Diet modification.  10. Dyslipidemia: Continue Zocor.  11. Hgb elevated, ? Polycythemia, plt up as well  12. Intermittent hypokalemia: Recheck labs in am.              -potassium supplementation- K+ 3.5 on 6/6 13.  Hypothyroid: On synthroid 112 mcg for supplement.  14. Constipation: On Miralax-->increase to bid              -continue senna-s qhs.  15. Chronic combined systolic/diastolic CHF:             -resuming coreg 3.125 and lasix 40mg  bid (home meds) 16.  Urinary retention improved with urecholine and flomax  likely immobility and ? Diabetic  cystopathy although the Hgb A1 C is fairly low- completed macrobid for UTI  17. L eye infection? 6/11- will start Polytrim 1 drop q4 hours for presumed eye infection.    LOS: 13 days A FACE TO FACE EVALUATION WAS PERFORMED  Taivon Haroon 02/14/2021, 2:13 PM

## 2021-02-14 NOTE — Plan of Care (Signed)
  Problem: Consults Goal: RH STROKE PATIENT EDUCATION Description: See Patient Education module for education specifics  Outcome: Progressing Goal: Diabetes Guidelines if Diabetic/Glucose > 140 Description: If diabetic or lab glucose is > 140 mg/dl - Initiate Diabetes/Hyperglycemia Guidelines & Document Interventions  Outcome: Progressing   Problem: RH BOWEL ELIMINATION Goal: RH STG MANAGE BOWEL WITH ASSISTANCE Description: STG Manage Bowel with mod I Assistance. Outcome: Progressing   Problem: RH BLADDER ELIMINATION Goal: RH STG MANAGE BLADDER WITH ASSISTANCE Description: STG Manage Bladder With mod I Assistance Outcome: Progressing   Problem: RH SKIN INTEGRITY Goal: RH STG MAINTAIN SKIN INTEGRITY WITH ASSISTANCE Description: STG Maintain Skin Integrity With mod I Assistance. Outcome: Progressing   Problem: RH SAFETY Goal: RH STG ADHERE TO SAFETY PRECAUTIONS W/ASSISTANCE/DEVICE Description: STG Adhere to Safety Precautions With cues and reminders. Outcome: Progressing   Problem: RH KNOWLEDGE DEFICIT Goal: RH STG INCREASE KNOWLEDGE OF DIABETES Description: Pt will demonstrate understanding of medication regimen, dietary and lifestyle modifications to better control blood glucose levels with min assist using handouts and booklet provided Outcome: Progressing Goal: RH STG INCREASE KNOWLEDGE OF HYPERTENSION Description: Pt will demonstrate understanding of medication regimen, dietary and lifestyle modifications to better control blood pressure with cues/reminders using handouts and booklet provided. Outcome: Progressing Goal: RH STG INCREASE KNOWLEDGE OF STROKE PROPHYLAXIS Description: Pt will demonstrate understanding of medication regimen, dietary and lifestyle modifications to better control secondary stroke risks/prophylaxis  and prevent stroke using handouts and booklet provided independently Outcome: Progressing   Problem: RH KNOWLEDGE DEFICIT Goal: RH STG INCREASE  KNOWLEGDE OF HYPERLIPIDEMIA Description: Pt will demonstrate understanding of medication regimen, dietary and lifestyle modifications to better control HLD with cues/reminders using handouts and booklet provided. Outcome: Progressing

## 2021-02-14 NOTE — Progress Notes (Signed)
Speech Language Pathology Daily Session Note  Patient Details  Name: Sue [name] MRN: [id number] Date of Birth: 06/12/1930  Today's Date: 02/14/2021 SLP Individual Time: 1345-1425 SLP Individual Time Calculation (min): 40 min  Short Term Goals: Week 2: SLP Short Term Goal 1 (Week 2): Pt will complete mildly complex problem solving tasks for functional activities given min A verbal cues SLP Short Term Goal 2 (Week 2): Pt will demonstrate recall of novel and daily information with visual aid given min A verbal cues. SLP Short Term Goal 3 (Week 2): Pt will demonstrate self-monitoring and self-awareness of functional errors with min A verbal cues.  Skilled Therapeutic Interventions: Skilled treatment session focused on cognitive goals. SLP facilitated session by providing extra time and overall Min A verbal cues for basic problem solving with a 4-step picture sequencing task. Patient appeared more awake with increased social engagement today compared to yesterday's session. Patient independently recalled where she placed her daily therapy schedule but demonstrated confusion regarding returning to her hospital room vs home. She also reported that both cell phones in her room (one for personal use and one for medical device) were not hers. SLP provided education. Patient requested to return to bed at end of session and was transferred via the [facility]. Patient left upright in bed with alarm on and all needs within reach. Continue with current plan of care.      Pain No/Denies Pain   Therapy/Group: Individual Therapy  Markeesha Char 02/14/2021, 2:34 PM

## 2021-02-14 NOTE — Progress Notes (Signed)
Physical Therapy Session Note  Patient Details  Name: Sue [name] MRN: [id number] Date of Birth: 06-28-1930  Today's Date: 02/14/2021 PT Individual Time: 4098-1191 and 4782-9562 PT Individual Time Calculation (min): 66 min  and 53 min    Short Term Goals: Week 2:  PT Short Term Goal 1 (Week 2): Patient will complete sit <> supine with MinA PT Short Term Goal 2 (Week 2): Patient will complete bed <> wc transfer with LRAD and MinA PT Short Term Goal 3 (Week 2): Patient will participate in at least 75% of her daily therapy sessions  Skilled Therapeutic Interventions/Progress Updates:   Session 1: Pt received supine in bed asleep and upon awakening pt agreeable to therapy session. Therapist retrieved pt some pants to wear per her request and upon returning pt not agreeable to therapy session stating she is tired and wants to rest, requires encouragement and therapist reinforced prior education regarding therapy schedule and importance of participating with pt agreeable - throughout the session pt's mood improves and she appears to enjoy session. Supine>sitting L EOB, HOB partially elevated and using bedrail, supervision and requiring decreased time and effort to scoot to EOB compared to in previous sessions. Sitting EOB, donned pants requiring assist for threading over LEs - no posterior lean noted in sitting. Sit>stand EOB>RW with min assist for balance, when allowed increased time prior to providing assist pt demos improved anterior weight shift. Short distance ~49ft ambulatory transfer EOB>w/c using RW with min assist for balance - continues to require cuing to turn fully and side step to place her in front of the chair prior to initiating sit, pt becoming more aware that she needs to line up her hips with the chair but still has difficulty motor planning this. Sitting in w/c at sink performed oral care and brushed her hair with assist to put hair up.    Transported to/from gym in w/c for time  management and energy conservation. Sit<>stands w/c<>RW throughout session with min assist for balance - with question cuing "where do your hands go?" Pt able to recall and place them correctly - pt demos improving carryover of need to scoot hips forward in chair prior to initiating transition into standing. Gait training 147ft 2x using RW with min assist for balance - continues to demo excessive forward and L lateral trunk lean causing pt to push AD too far forward - cuing/facilitation for improvement with minimal carryover though less severe impairments compared to last time walked with this therapist.   B LE strengthening task using 1st 6" step via repeated R/L step up/down with mod assist for balance and pt relying heavily on B UEs on HRs to prevent posterior LOB - therapist attempts to cue/facilitate for increased hip extension with anterior weight shift when stepping up but pt has impaired motor planning - x10reps R LE and x5reps L LE with pt fatiguing quickly with this activity.    Transported back to room and pt suspected to be incontinent of BM. Gait training ~38ft 2x in/out of bathroom using RW with min assist for balance - pt with more difficulty managing AD in this environment often bumping items on the L and pushing AD too far forward when going up/down slope to bathroom requiring cuing for correction to increase safety and balance. Standing using R UE support on grab bar and L UE on RW with CGA for steadying and 1x mod assist due to posterior LOB while therapist performed total assist LB clothing management and peri-care -  pt voided small BM. At end of session pt left sitting in w/c with needs in reach and seat belt alarm on.  Session 2: Pt received supine in bed with the lights off watching TV and pt excited to see this thearpist and is agreeable to therapy session. Pt demonstrates significant confusion throughout session and she is somewhat aware of her confusion but is unable to delineate  reality from her confused state - pt talks about how she was lying on the "couch" (hospital bed) with her husband (who has passed) and that she recognizes some of her "friends" (hospital staff) but she knows they aren't really her "friends" and she talks about when she "was in the hospital." Pt repeatedly states "I wish I could describe it to you" talking about how she is having a hard time understanding/remembering where she is at. Therapist attempts to reorient patient; however, pt has increased verbosity throughout session making this challenging. Supine>sitting R EOB, HOB flat but using bedrail, supervision. Sit<>stands using RW with min assist for balance and pt requiring increased cuing for hand placement this session as pt asking where she should put her hands stating she is scared the RW is going to flip over on her. Short distance ~73ft ambulatory transfer EOB>w/c using RW with min assist for balance - continues to require cuing to turn fully prior to initiating sit. Transported to/from gym in w/c for time management and energy conservation. Gait training ~123ft using RW with min assist for balance - redirected gait path back to pt's room as she reports need to use bathroom - continues to demo excessive trunk flexion and L lateral trunk flexion along with pushing AD too far forward and short, shuffled steps. Standing with min assist for balance due to posterior lean during total assist LB clothing management due to urgency - continent of small BM and requires total assist for peri-care. Gait training ~90ft using RW with min assist as described above to the sink where pt performed standing hand hygiene with min assist for balance. With encouragement and redirection pt agreeable for additional gait training. Gait training ~129ft using RW with min assist for balance - continues to demo above gait deviations with minimal improvement despite cuing/facilitation. Pt agreeable to remain sitting up in recliner -  positioned with needs in reach, seat belt alarm on, and BLEs elevated. Therapist about to exit room when noticed a small tick crawling under pt's L eye - therapist immediately retrieved it off of the patient, notified the [facility], [place] and therapist placed the tick in a specimen jar.   Therapy Documentation Precautions:  Precautions Precautions: Fall, Other (comment) Precaution Comments: pt reports 2 falls in past 1 year Restrictions Weight Bearing Restrictions: No   Pain:  Session 1: Denies pain during session.  Session 2: Denies pain during session. Pt states that she is not having pain while walking that she was having before.    Therapy/Group: Individual Therapy  Ginny Forth , PT, DPT, CSRS 02/14/2021, 7:57 AM

## 2021-02-14 NOTE — Progress Notes (Signed)
This nurse informed of tick found on patient this evening. MD Lovorn and charge nurse notified and shown the specimen.No additional orders received. Patient sitting up in chair eating dinner. Will report to night shift to do full body check once patient is in bed.

## 2021-02-15 DIAGNOSIS — K1379 Other lesions of oral mucosa: Secondary | ICD-10-CM

## 2021-02-15 MED ORDER — BENZOCAINE 10 % MT GEL
OROMUCOSAL | Status: DC | PRN
  Filled 2021-02-15: qty 9

## 2021-02-15 MED ORDER — MAGIC MOUTHWASH W/LIDOCAINE
5.0000 mL | Freq: Four times a day (QID) | ORAL | Status: DC
  Administered 2021-02-15 – 2021-02-23 (×16): 5 mL via ORAL
  Filled 2021-02-15 (×34): qty 5

## 2021-02-15 NOTE — Progress Notes (Signed)
Speech Language Pathology Daily Session Note  Patient Details  Name: Sue [name] MRN: [id number] Date of Birth: March 11, 1930  Today's Date: 02/15/2021 SLP Individual Time: 3664-4034 SLP Individual Time Calculation (min): 45 min  Short Term Goals: Week 2: SLP Short Term Goal 1 (Week 2): Pt will complete mildly complex problem solving tasks for functional activities given min A verbal cues SLP Short Term Goal 2 (Week 2): Pt will demonstrate recall of novel and daily information with visual aid given min A verbal cues. SLP Short Term Goal 3 (Week 2): Pt will demonstrate self-monitoring and self-awareness of functional errors with min A verbal cues.  Skilled Therapeutic Interventions:   Skilled SLP intervention focused on cognition. Pt oriented to date and day of the week using calendar posted in room with min-mod A verbal cues. Pt questioned DC date and stated she will not be physically ready to go home on the16th and requested to stay in rehab longer. Pt able to state 3 physical limitations and tasks that she will need help with once home. When questioned about son, pt stated he would not be abe to provide the physical assistance that she needs. Picture card sequencing task completed with mod verbal and visual direction to complete task steps entirely and place photos in the correct order. Cont with therapy per plan of care.   Pain Pain Assessment Pain Scale: 0-10 Faces Pain Scale: No hurt  Therapy/Group: Individual Therapy  Sue [name] Sue [name] 02/15/2021, 8:36 AM

## 2021-02-15 NOTE — Plan of Care (Signed)
  Problem: Consults Goal: RH STROKE PATIENT EDUCATION Description: See Patient Education module for education specifics  Outcome: Progressing Goal: Diabetes Guidelines if Diabetic/Glucose > 140 Description: If diabetic or lab glucose is > 140 mg/dl - Initiate Diabetes/Hyperglycemia Guidelines & Document Interventions  Outcome: Progressing   Problem: RH BOWEL ELIMINATION Goal: RH STG MANAGE BOWEL WITH ASSISTANCE Description: STG Manage Bowel with mod I Assistance. Outcome: Progressing   Problem: RH BLADDER ELIMINATION Goal: RH STG MANAGE BLADDER WITH ASSISTANCE Description: STG Manage Bladder With mod I Assistance Outcome: Progressing   Problem: RH SKIN INTEGRITY Goal: RH STG MAINTAIN SKIN INTEGRITY WITH ASSISTANCE Description: STG Maintain Skin Integrity With mod I Assistance. Outcome: Progressing   Problem: RH SAFETY Goal: RH STG ADHERE TO SAFETY PRECAUTIONS W/ASSISTANCE/DEVICE Description: STG Adhere to Safety Precautions With cues and reminders. Outcome: Progressing   Problem: RH KNOWLEDGE DEFICIT Goal: RH STG INCREASE KNOWLEDGE OF DIABETES Description: Pt will demonstrate understanding of medication regimen, dietary and lifestyle modifications to better control blood glucose levels with min assist using handouts and booklet provided Outcome: Progressing Goal: RH STG INCREASE KNOWLEDGE OF HYPERTENSION Description: Pt will demonstrate understanding of medication regimen, dietary and lifestyle modifications to better control blood pressure with cues/reminders using handouts and booklet provided. Outcome: Progressing Goal: RH STG INCREASE KNOWLEDGE OF STROKE PROPHYLAXIS Description: Pt will demonstrate understanding of medication regimen, dietary and lifestyle modifications to better control secondary stroke risks/prophylaxis  and prevent stroke using handouts and booklet provided independently Outcome: Progressing   Problem: RH KNOWLEDGE DEFICIT Goal: RH STG INCREASE  KNOWLEGDE OF HYPERLIPIDEMIA Description: Pt will demonstrate understanding of medication regimen, dietary and lifestyle modifications to better control HLD with cues/reminders using handouts and booklet provided. Outcome: Progressing   

## 2021-02-15 NOTE — Progress Notes (Signed)
PROGRESS NOTE   Subjective/Complaints:  Pt more awake, with it-  Per staff, found tick on pt yesterday evening- was NOT embedded-was crawling- removed appropriately.  Pt reports partial top dentures not fitting well due to gum/mouth pain-   L eye draining- is slightly better per pt.     ROS-  Pt denies SOB, abd pain, CP, N/V/C/D, and vision changes   Objective:   No results found. No results for input(s): WBC, HGB, HCT, PLT in the last 72 hours.  No results for input(s): NA, K, CL, CO2, GLUCOSE, BUN, CREATININE, CALCIUM in the last 72 hours.   Intake/Output Summary (Last 24 hours) at 02/15/2021 1021 Last data filed at 02/14/2021 1845 Gross per 24 hour  Intake 220 ml  Output --  Net 220 ml        Physical Exam: Vital Signs Blood pressure 117/77, pulse 76, temperature 98.2 F (36.8 C), temperature source Oral, resp. rate 19, height 5\' 4"  (1.626 m), weight 83 kg, SpO2 92 %.     General: awake, alert, appropriate,  sitting up in bed; NAD HENT: conjugate gaze; oropharynx moist; L eye draining serous drainage- trace to small amount; crusty under eye- conjunctiva erythematous, injected; upper palate and oropharynx slightly raw/erythematous on exam with dentures taken out- no plaques or thrush seen.  CV: regular rate; no JVD Pulmonary: CTA B/L; no W/R/R- good air movement GI: soft, NT, ND, (+)BS Psychiatric: appropriate; interactive Neurological: more alert  Neurologic: Cranial nerves II through XII intact, motor strength is 4/5 in bilateral deltoid, bicep, tricep, grip, hip flexor, knee extensors, ankle dorsiflexor and plantar flexor  Musculoskeletal: Full range of motion in all 4 extremities. No joint swelling    Assessment/Plan: 1. Functional deficits which require 3+ hours per day of interdisciplinary therapy in a comprehensive inpatient rehab setting. Physiatrist is providing close team supervision and 24  hour management of active medical problems listed below. Physiatrist and rehab team continue to assess barriers to discharge/monitor patient progress toward functional and medical goals  Care Tool:  Bathing    Body parts bathed by patient: Right arm, Left arm, Chest, Abdomen, Right upper leg, Left upper leg, Face, Front perineal area     Body parts n/a: Right lower leg, Left lower leg, Buttocks   Bathing assist Assist Level: Minimal Assistance - Patient > 75% (seated in shower)     Upper Body Dressing/Undressing Upper body dressing   What is the patient wearing?: Pull over shirt    Upper body assist Assist Level: Minimal Assistance - Patient > 75%    Lower Body Dressing/Undressing Lower body dressing      What is the patient wearing?: Incontinence brief, Pants     Lower body assist Assist for lower body dressing: Maximal Assistance - Patient 25 - 49%     Toileting Toileting Toileting Activity did not occur (Clothing management and hygiene only): Refused  Toileting assist Assist for toileting: Maximal Assistance - Patient 25 - 49%     Transfers Chair/bed transfer  Transfers assist     Chair/bed transfer assist level: Minimal Assistance - Patient > 75% Chair/bed transfer assistive device: , Environmental consultant  assist      Assist level: Minimal Assistance - Patient > 75% Assistive device: Walker-rolling Max distance: 151ft   Walk 10 feet activity   Assist     Assist level: Minimal Assistance - Patient > 75% Assistive device: Walker-rolling   Walk 50 feet activity   Assist    Assist level: Minimal Assistance - Patient > 75% Assistive device: Walker-rolling    Walk 150 feet activity   Assist Walk 150 feet activity did not occur: Safety/medical concerns  Assist level: Minimal Assistance - Patient > 75% Assistive device: Walker-rolling    Walk 10 feet on uneven surface  activity   Assist Walk 10 feet on  uneven surfaces activity did not occur: Safety/medical concerns         Wheelchair     Assist Will patient use wheelchair at discharge?: No (TBD)             Wheelchair 50 feet with 2 turns activity    Assist            Wheelchair 150 feet activity     Assist          Blood pressure 117/77, pulse 76, temperature 98.2 F (36.8 C), temperature source Oral, resp. rate 19, height 5\' 4"  (1.626 m), weight 83 kg, SpO2 92 %.  Medical Problem List and Plan: 1.  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 cardioembolic has loop recorder              -patient may shower             -ELOS/Goals: 6/14. Supervision  -con't PT, OT and SLP 2.  Antithrombotics: -DVT/anticoagulation:  Pharmaceutical: Lovenox             -antiplatelet therapy: DAPT X 3 weeks followed by ASA alone.  3. Pain Management: Tylenol prn.              -add voltaren gel for right 1st MTP jt pain             -observe for pain tolerance with WB in therapy 4. Mood: LCSW to follow for evaluation and support.              -chronic depression: wellbutrin SR 150mg  daily             -antipsychotic agents: N/A 5. Neuropsych: This patient is somewhat capable of making decisions on her own behalf. 6. Skin/Wound Care: Routine pressure relief measures.  7. Fluids/Electrolytes/Nutrition: Monitor I/O. Repeat lytes nl  8. HTN: Monitor BP tid. Avoid hypoperfusion.              -resume coreg 3.125mg  BID  Vitals:   02/14/21 1924 02/15/21 0435  BP: 122/73 117/77  Pulse: 73 76  Resp: 19 19  Temp: 98.1 F (36.7 C) 98.2 F (36.8 C)  SpO2: 92% 92%  BP controlled 6/12- con't regimen 9. T2DM: Hgb A1C-6.9. Monitor BS ac/hs and use SSI for elevated BS             --Diet modification.  10. Dyslipidemia: Continue Zocor.  11. Hgb elevated, ? Polycythemia, plt up as well  12. Intermittent hypokalemia: Recheck labs in am.              -potassium  supplementation- K+ 3.5 on 6/6 13.  Hypothyroid: On synthroid 112 mcg for supplement.  14. Constipation: On Miralax-->increase to bid              -  continue senna-s qhs.  15. Chronic combined systolic/diastolic CHF:             -resuming coreg 3.125 and lasix 40mg  bid (home meds) 16.  Urinary retention improved with urecholine and flomax  likely immobility and ? Diabetic cystopathy although the Hgb A1 C is fairly low- completed macrobid for UTI 17. L eye infection? 6/11- will start Polytrim 1 drop q4 hours for presumed eye infection.  6/12- still draining- not quite as bad- con't regimen for now.  18. Gum/oropharynx discomfort  6/12- will order oragel prn asl as magic mouth with lidocaine QID.      LOS: 14 days A FACE TO FACE EVALUATION WAS PERFORMED  Sue [name] 02/15/2021, 10:21 AM

## 2021-02-15 NOTE — Progress Notes (Signed)
Physical Therapy Session Note  Patient Details  Name: Sue [name] MRN: [id number] Date of Birth: 1929/09/08  Today's Date: 02/15/2021 PT Missed Time: 60 Minutes Missed Time Reason: Patient ill (Comment)  Short Term Goals: Week 1:  PT Short Term Goal 1 (Week 1): Pt will perform supine<>sit with CGA PT Short Term Goal 1 - Progress (Week 1): Progressing toward goal PT Short Term Goal 2 (Week 1): Pt will perform sit<>stands using LRAD with CGA PT Short Term Goal 2 - Progress (Week 1): Progressing toward goal PT Short Term Goal 3 (Week 1): Pt will perform stand pivot transfers using LRAD with CGA PT Short Term Goal 3 - Progress (Week 1): Progressing toward goal PT Short Term Goal 4 (Week 1): Pt will ambulate at least 16ft using LRAD with CGA PT Short Term Goal 4 - Progress (Week 1): Progressing toward goal  Skilled Therapeutic Interventions/Progress Updates:  Chart reviewed. Pt received supine in bed calling for assistance. Pt greeted and asked for agreement to participate in session. Pt tearfully explained not feeling well, citing frequent urge to have BM, but stating that she has not been able to go when attempting toileting. Pt very concerned for having BM during therapy and requesting to be allowed to rest. Pt also appeared confused, stating that she was waiting to hear back from a counselor about the issue. PT discussed situation with RN. Per RN, pt had some infrequency with BM, so RN to follow up, however no concern present to withhold PT. PT returned to room to discuss and encourage pt participation. However, pt was adamant of need to wait for BM and highly concerned of having an accident in PT session. Pt initially tearful, but calm by end of discussion, though stating she felt unwell and thus did not participate in PT.    Therapy Documentation Precautions:  Precautions Precautions: Fall, Other (comment) Precaution Comments: pt reports 2 falls in past 1 year Restrictions Weight  Bearing Restrictions: No  General: PT Amount of Missed Time (min): 60 Minutes PT Missed Treatment Reason: Patient ill (Comment)   Perrin Maltese, PT, DPT 02/15/2021, 12:48 PM

## 2021-02-16 NOTE — Progress Notes (Signed)
Physical Therapy Weekly Progress Note  Patient Details  Name: Sue [name] MRN: [id number] Date of Birth: Apr 16, 1930  Beginning of progress report period: February 10, 2021 End of progress report period: February 16, 2021  Today's Date: 02/16/2021 PT Individual Time: 0800-0822 PT Individual Time Calculation (min): 22 min  and Today's Date: 02/16/2021 PT Missed Time: 23 Minutes; 45 mins  Missed Time Reason: Patient unwilling to participate  Patient has met 0 of 3 short term goals.  Patient is making minimal progress toward her goals. She remains grossly limited by poor consistent participation in therapy to address the following deficits: poor awareness/insight, posterior bias, decreased initiation, decreased global strength, decreased safety of gait mechanics, impaired transfers. Since patient is poorly oriented and has decreased tolerance to frustration, patient has been limited in her participation in therapeutic tasks. She fluctuates in level of assist required- sometimes MinA, sometimes MaxA.   Patient continues to demonstrate the following deficits muscle weakness, decreased cardiorespiratoy endurance, decreased attention to left and decreased motor planning, decreased initiation, decreased attention, decreased awareness, decreased problem solving, decreased safety awareness, decreased memory, and delayed processing, and decreased sitting balance, decreased standing balance, decreased postural control, and decreased balance strategies and therefore will continue to benefit from skilled PT intervention to increase functional independence with mobility.  Patient not progressing toward long term goals.  See goal revision..  Continue plan of care.  PT Short Term Goals Week 2:  PT Short Term Goal 1 (Week 2): Patient will complete sit <> supine with MinA PT Short Term Goal 1 - Progress (Week 2): Progressing toward goal PT Short Term Goal 2 (Week 2): Patient will complete bed <> wc transfer with LRAD  and MinA PT Short Term Goal 2 - Progress (Week 2): Progressing toward goal PT Short Term Goal 3 (Week 2): Patient will participate in at least 75% of her daily therapy sessions PT Short Term Goal 3 - Progress (Week 2): Progressing toward goal Week 3:  PT Short Term Goal 1 (Week 3): STG = LTG based on ELOS  Skilled Therapeutic Interventions/Progress Updates:    Session 1: Patient received supine in bed, on bed pan, initially agreeable to PT. She denies pain, but reports urge to urinate without success. PT alerting RN to this, RN reports that bladder scan 1 hours ago was 71ml. PT attempting running water and instructing patient through bladder massage without success. Patient agreeable to attempt OOB activity to see if that will help void. She was able to roll R with verbal cues and use of bed rails. PT re-donning brief. Patient reporting that she was unable to come sit edge of bed herself- requiring MaxA to sit EOB. MaxA stand pivot to wc with PT needing to manually place patients hips inline with wc since patient was attempting to prematurely sit. Strong posterior bias maintained throughout transfer. Once in the wc, patient stating "I'm not going to the gym looking like this." (She was in a hospital gown.) Patient declining to change into clothing/hospital scrubs and was then agreeable to ambulate in the hallway- despite having hospital gown on. Once in the hallway, patient stating that she wanted this PT to walk up and down the hallway to see if that would help patient urinate. PT kindly explaining that that's not how it works. Patient then refusing to participate in any therapy becoming increasingly frustrated. Ultimately, patient agreeable to remain up in wc to eat breakfast. Seatbelt alarm on, call light within reach.   Session 2: Patient received  supine in bed, eyes open, but looking straight ahead. PT introduced herself, but patient stated "the door is blocking you, I can't see you." PT was not behind  door, but rather just to the L of patient at bedside. PT instructing patient to scan L and she was able to see PT eventually. Patient denies pain, but refuses to get OOB stating that her son and his 4 daughters will be here "any minute." PT unable to direct patient to participate in therapy until son arrives. Patient borderline agitated stating that she will remain "right as (she) is until he comes." Bed alarm on, call light within reach.   Therapy Documentation Precautions:  Precautions Precautions: Fall, Other (comment) Precaution Comments: pt reports 2 falls in past 1 year Restrictions Weight Bearing Restrictions: No    Therapy/Group: Individual Therapy  Debbora Dus 02/16/2021, 7:45 AM

## 2021-02-16 NOTE — Progress Notes (Signed)
Speech Language Pathology Daily Session Note  Patient Details  Name: Sue [name] MRN: [id number] Date of Birth: Sep 29, 1929  Today's Date: 02/16/2021 SLP Individual Time: 0930-1000 SLP Individual Time Calculation (min): 30 min  Short Term Goals: Week 2: SLP Short Term Goal 1 (Week 2): Pt will complete mildly complex problem solving tasks for functional activities given min A verbal cues SLP Short Term Goal 2 (Week 2): Pt will demonstrate recall of novel and daily information with visual aid given min A verbal cues. SLP Short Term Goal 3 (Week 2): Pt will demonstrate self-monitoring and self-awareness of functional errors with min A verbal cues.  Skilled Therapeutic Interventions:Skilled ST services focused on cognitive skills. Pt continues to demonstrate continued confusion and reduced awareness of impact of acute deficits. Pt requested to transfer from [facility] to bed via RW, pt required mod A verbal cues to recall transfer steps and during the transfer attempted to sit down before reaching the bed due to feeling of brief falling. In reflection pt was aware of unsafe movements, but did not appear to grasp severity of poor safety awareness. Pt was orientated to all but date/day of the week, however unable to carryover how to use calendar with max A. Pt responded to 0 out 2 mildly complex problem solving questions from ALFA. SLP downgraded goals to reflect poor carryover and continued confusion. Pt was left in room with call bell within reach and bed alarm set. SLP recommends to continue skilled services.     Pain Pain Assessment Pain Scale: 0-10 Pain Score: 0-No pain  Therapy/Group: Individual Therapy  Cortavious Nix  [facility] 02/16/2021, 12:54 PM

## 2021-02-16 NOTE — Progress Notes (Signed)
Inpatient Rehab Admissions Coordinator:   Left voicemail with pt's son, Thermon Leyland.    Estill Dooms, PT, DPT Admissions Coordinator [phone] 02/16/21  2:59 PM

## 2021-02-16 NOTE — Plan of Care (Signed)
  Problem: RH Problem Solving Goal: LTG Patient will demonstrate problem solving for (SLP) Description: LTG:  Patient will demonstrate problem solving for basic/complex daily situations with cues  (SLP) Flowsheets (Taken 02/02/2021 1258) LTG: Patient will demonstrate problem solving for (SLP): (basic-mildly complex problem solving)  Basic daily situations  Other (comment) Note: Downgraded complexity due to continued confusion

## 2021-02-16 NOTE — Progress Notes (Signed)
The phlebotomist alerted that patient refuses her labs this morning. Oncoming nurse made aware.

## 2021-02-16 NOTE — Progress Notes (Signed)
Patient ID: Sue [name], female   DOB: [birth year], 87 y.o.   MRN: [id number]  SW spoke to pt son. Informed sw he spoke with physician. HH and HC list provided in pt room for son to pick up  Sue [name], [place] [phone]

## 2021-02-16 NOTE — Progress Notes (Addendum)
PROGRESS NOTE   Subjective/Complaints:    No Left eye pain , mild blurriness Notes bruising on both legs  ROS-  Pt denies SOB, abd pain, CP, N/V/C/D, and vision changes   Objective:   No results found. No results for input(s): WBC, HGB, HCT, PLT in the last 72 hours.  No results for input(s): NA, K, CL, CO2, GLUCOSE, BUN, CREATININE, CALCIUM in the last 72 hours.   Intake/Output Summary (Last 24 hours) at 02/16/2021 0854 Last data filed at 02/15/2021 1900 Gross per 24 hour  Intake 620 ml  Output --  Net 620 ml         Physical Exam: Vital Signs Blood pressure 127/71, pulse 64, temperature 98 F (36.7 C), temperature source Oral, resp. rate (!) 21, height 5\' 4"  (1.626 m), weight 83 kg, SpO2 94 %.  General: No acute distress Mood and affect are appropriate Heart: Regular rate and rhythm no rubs murmurs or extra sounds Lungs: Clear to auscultation, breathing unlabored, no rales or wheezes Abdomen: Positive bowel sounds, soft nontender to palpation, nondistended Extremities: No clubbing, cyanosis, or edema Skin: No evidence of breakdown, petechiae and bruising on legs     Neurologic: Cranial nerves II through XII intact, motor strength is 4/5 in bilateral deltoid, bicep, tricep, grip, hip flexor, knee extensors, ankle dorsiflexor and plantar flexor  Musculoskeletal: Full range of motion in all 4 extremities. No joint swelling    Assessment/Plan: 1. Functional deficits which require 3+ hours per day of interdisciplinary therapy in a comprehensive inpatient rehab setting. Physiatrist is providing close team supervision and 24 hour management of active medical problems listed below. Physiatrist and rehab team continue to assess barriers to discharge/monitor patient progress toward functional and medical goals  Care Tool:  Bathing    Body parts bathed by patient: Right arm, Left arm, Chest, Abdomen, Right  upper leg, Left upper leg, Face, Front perineal area     Body parts n/a: Right lower leg, Left lower leg, Buttocks   Bathing assist Assist Level: Minimal Assistance - Patient > 75% (seated in shower)     Upper Body Dressing/Undressing Upper body dressing   What is the patient wearing?: Pull over shirt    Upper body assist Assist Level: Minimal Assistance - Patient > 75%    Lower Body Dressing/Undressing Lower body dressing      What is the patient wearing?: Incontinence brief, Pants     Lower body assist Assist for lower body dressing: Maximal Assistance - Patient 25 - 49%     Toileting Toileting Toileting Activity did not occur and hygiene only): Refused  Toileting assist Assist for toileting: Maximal Assistance - Patient 25 - 49%     Transfers Chair/bed transfer  Transfers assist     Chair/bed transfer assist level: Maximal Assistance - Patient 25 - 49% Chair/bed transfer assistive device: Walker, Press photographer   Ambulation assist      Assist level: Minimal Assistance - Patient > 75% Assistive device: Walker-rolling Max distance: 168ft   Walk 10 feet activity   Assist     Assist level: Minimal Assistance - Patient > 75% Assistive device: Walker-rolling  Walk 50 feet activity   Assist    Assist level: Minimal Assistance - Patient > 75% Assistive device: Walker-rolling    Walk 150 feet activity   Assist Walk 150 feet activity did not occur: Safety/medical concerns  Assist level: Minimal Assistance - Patient > 75% Assistive device: Walker-rolling    Walk 10 feet on uneven surface  activity   Assist Walk 10 feet on uneven surfaces activity did not occur: Safety/medical concerns         Wheelchair     Assist Will patient use wheelchair at discharge?: No (TBD)             Wheelchair 50 feet with 2 turns activity    Assist            Wheelchair 150 feet activity      Assist          Blood pressure 127/71, pulse 64, temperature 98 F (36.7 C), temperature source Oral, resp. rate (!) 21, height 5\' 4"  (1.626 m), weight 83 kg, SpO2 94 %.  Medical Problem List and Plan: 1.  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 cardioembolic has loop recorder              -patient may shower             -ELOS/Goals: 6/14. Supervision  -con't PT, OT and SLP 2.  Antithrombotics: -DVT/anticoagulation:  Pharmaceutical: Lovenox             -antiplatelet therapy: DAPT X 3 weeks followed by ASA alone.  Some bruising and petechiae on legs will D/C SCDscont Lovenox  3. Pain Management: Tylenol prn.              -add voltaren gel for right 1st MTP jt pain             -observe for pain tolerance with WB in therapy 4. Mood: LCSW to follow for evaluation and support.              -chronic depression: wellbutrin SR 150mg  daily             -antipsychotic agents: N/A 5. Neuropsych: This patient is somewhat capable of making decisions on her own behalf. 6. Skin/Wound Care: Routine pressure relief measures.  7. Fluids/Electrolytes/Nutrition: Monitor I/O. Repeat lytes nl  8. HTN: Monitor BP tid. Avoid hypoperfusion.              -resume coreg 3.125mg  BID  Vitals:   02/15/21 1859 02/16/21 0539  BP: 119/76 127/71  Pulse: 71 64  Resp: 17 (!) 21  Temp: (!) 97.4 F (36.3 C) 98 F (36.7 C)  SpO2: 96% 94%  BP controlled 6/13 con't regimen 9. T2DM: Hgb A1C-6.9. Monitor BS ac/hs and use SSI for elevated BS             --Diet modification.  10. Dyslipidemia: Continue Zocor.  11. Hgb elevated, ? Polycythemia, plt up as well  12. Intermittent hypokalemia: Recheck labs in am.              -potassium supplementation- K+ 3.5 on 6/6 13.  Hypothyroid: On synthroid 112 mcg for supplement.  14. Constipation: On Miralax-->increase to bid              -continue senna-s qhs.  15. Chronic combined systolic/diastolic  CHF:             -resuming coreg 3.125 and lasix 40mg  bid (home  meds) 16.  Urinary retention improved with urecholine and flomax  likely immobility and ? Diabetic cystopathy although the Hgb A1 C is fairly low- completed macrobid for UTI 17. L eye infection? 6/11- will start Polytrim 1 drop q4 hours for presumed eye infection.  6/12- still draining- not quite as bad- con't regimen for now.  6/13 no drainage noted mild injection cont Polytrim 18. Gum/oropharynx discomfort  6/12- will order oragel prn asl as magic mouth with lidocaine QID.   Dispo spoke with son today who was concerned about pt's progress as well as insurance issues.  Discussed stroke locations, impact on cognition.    LOS: 15 days A FACE TO FACE EVALUATION WAS PERFORMED  Erick Colace 02/16/2021, 8:54 AM

## 2021-02-16 NOTE — Plan of Care (Signed)
  Problem: RH Problem Solving Goal: LTG Patient will demonstrate problem solving for (SLP) Description: LTG:  Patient will demonstrate problem solving for basic/complex daily situations with cues  (SLP) 02/16/2021 1302 by Colin Benton, CCC-SLP Flowsheets (Taken 02/16/2021 1302) LTG Patient will demonstrate problem solving for: Minimal Assistance - Patient > 75% 02/16/2021 1240 by Colin Benton, CCC-SLP Flowsheets (Taken 02/02/2021 1258) LTG: Patient will demonstrate problem solving for (SLP): (basic-mildly complex problem solving)  Basic daily situations  Other (comment) Note: Downgraded complexity due to continued confusion    Problem: RH Memory Goal: LTG Patient will use memory compensatory aids to (SLP) Description: LTG:  Patient will use memory compensatory aids to recall biographical/new, daily complex information with cues (SLP) Flowsheets (Taken 02/16/2021 1302) LTG: Patient will use memory compensatory aids to (SLP): Minimal Assistance - Patient > 75%   Problem: RH Attention Goal: LTG Patient will demonstrate this level of attention during functional activites (SLP) Description: LTG:  Patient will will demonstrate this level of attention during functional activites (SLP) Flowsheets (Taken 02/16/2021 1302) LTG: Patient will demonstrate this level of attention during cognitive/linguistic activities with assistance of (SLP): Minimal Assistance - Patient > 75%   Problem: RH Awareness Goal: LTG: Patient will demonstrate awareness during functional activites type of (SLP) Description: LTG: Patient will demonstrate awareness during functional activites type of (SLP) Flowsheets (Taken 02/16/2021 1302) LTG: Patient will demonstrate awareness during cognitive/linguistic activities with assistance of (SLP): Moderate Assistance - Patient 50 - 74% Note: Poor carryover and continued confusion

## 2021-02-16 NOTE — Progress Notes (Signed)
Patient ID: KORDELIA SEVERIN, female   DOB: 1930/06/03, 87 y.o.   MRN: [id number]  SW received phone call from pt daughter who lives [place] [phone]. Daughter reports she know that her brother is primary contact, but is requesting updates. SW informed pt daughter of being unable to reach pt son to provide updates.  Daughter reports pt son reports he is unable to care for patient at d/c due to potentially having to assist with ADL care.   SW informed pt daughter that due to admitting to CIR patient does not have option to d/c to SNF. Pt daughter states she was unaware of this and unsure who made agreement for patient to transfer to CIR.   SW provided this informed to [facility] and will have a AC reach out to patient son to further explain info. Patient son: Thermon Leyland [phone]  Lavera Guise, [place] [phone]

## 2021-02-16 NOTE — Progress Notes (Signed)
Patient ID: DANIYLA PFAHLER, female   DOB: March 25, 1930, 87 y.o.   MRN: [id number]  SW spoke with patient son Thermon Leyland). Reports he is unable to care for mom due to her "not using the bathroom". Son reports patient has a UTI and he requested for patient to be transitioned back to acute. SW informed son I could have the physician follow up on medical concerns.   Son states that he wants his mother to discharge to a SNF. SW informed pt son this was not an option due to insurance barrier (manage medicare) and the CIR selection. Pt son reports this was not discussed with him or his mother upon admission. Informed son sw would have AC give him a call to further explain. Son states "he will be getting his lawyer involved".   SW informed physician and [facility] of this information and son contact information. SW will cont to follow up.  [place], [place] [phone]

## 2021-02-16 NOTE — Plan of Care (Signed)
  Problem: RH Balance Goal: LTG Patient will maintain dynamic sitting balance (PT) Description: LTG:  Patient will maintain dynamic sitting balance with assistance during mobility activities (PT) Flowsheets (Taken 02/16/2021 0832) LTG: Pt will maintain dynamic sitting balance during mobility activities with:: (downgraded due to patient progress) Minimal Assistance - Patient > 75% Goal: LTG Patient will maintain dynamic standing balance (PT) Description: LTG:  Patient will maintain dynamic standing balance with assistance during mobility activities (PT) Flowsheets (Taken 02/16/2021 0832) LTG: Pt will maintain dynamic standing balance during mobility activities with:: (downgraded due to patient progress) Moderate Assistance - Patient 50 - 74%   Problem: Sit to Stand Goal: LTG:  Patient will perform sit to stand with assistance level (PT) Description: LTG:  Patient will perform sit to stand with assistance level (PT) Flowsheets (Taken 02/16/2021 0832) LTG: PT will perform sit to stand in preparation for functional mobility with assistance level: (downgraded due to patient progress) Minimal Assistance - Patient > 75%   Problem: RH Bed Mobility Goal: LTG Patient will perform bed mobility with assist (PT) Description: LTG: Patient will perform bed mobility with assistance, with/without cues (PT). Flowsheets (Taken 02/16/2021 0832) LTG: Pt will perform bed mobility with assistance level of: (downgraded due to patient progress) Minimal Assistance - Patient > 75%   Problem: RH Bed to Chair Transfers Goal: LTG Patient will perform bed/chair transfers w/assist (PT) Description: LTG: Patient will perform bed to chair transfers with assistance (PT). Flowsheets (Taken 02/16/2021 0832) LTG: Pt will perform Bed to Chair Transfers with assistance level: (downgraded due to patient progress) Minimal Assistance - Patient > 75%   Problem: RH Car Transfers Goal: LTG Patient will perform car transfers with assist  (PT) Description: LTG: Patient will perform car transfers with assistance (PT). Flowsheets (Taken 02/16/2021 0832) LTG: Pt will perform car transfers with assist:: (downgraded due to patient progress) Moderate Assistance - Patient 50 - 74%   Problem: RH Ambulation Goal: LTG Patient will ambulate in controlled environment (PT) Description: LTG: Patient will ambulate in a controlled environment, # of feet with assistance (PT). Flowsheets (Taken 02/16/2021 0832) LTG: Pt will ambulate in controlled environ  assist needed:: (downgraded due to patient progress) Moderate Assistance - Patient 50 - 74% LTG: Ambulation distance in controlled environment: 25 Goal: LTG Patient will ambulate in home environment (PT) Description: LTG: Patient will ambulate in home environment, # of feet with assistance (PT). Flowsheets (Taken 02/16/2021 0832) LTG: Pt will ambulate in home environ  assist needed:: (downgraded due to patient progress) Moderate Assistance - Patient 50 - 74% LTG: Ambulation distance in home environment: 25

## 2021-02-16 NOTE — Progress Notes (Signed)
When waken to take thyroid medication this morning, patient began yelling & saying to leave her alone. Explained to patient that it was time to take her medication, but she stated, put my head back down. This nurse again informed her about her thyroid medication & she stated that she did not want it. Medication was returned to the pyxis. No acute distress noted. Patient slept well through the night. No c/o pain. Will pass on in report to oncoming nurse.

## 2021-02-16 NOTE — Progress Notes (Signed)
Occupational Therapy Session Note  Patient Details  Name: Sue [name] MRN: [id number] Date of Birth: [birth year]  Today's Date: 02/16/2021 OT Individual Time: 6599-3570 OT Individual Time Calculation (min): 68 min    Short Term Goals: Week 2:  OT Short Term Goal 1 (Week 2): Pt will complete UB dressing with supervision for two consecutive sessions. OT Short Term Goal 2 (Week 2): Pt will complete LB bathing sit to stand from wheelchair or shower seat with min assist. OT Short Term Goal 3 (Week 2): Pt will complete LB dressing sit to stand with min assist. OT Short Term Goal 4 (Week 2): Pt will complete toilet transfers with min assist using the RW for support.  Skilled Therapeutic Interventions/Progress Updates:    Pt sitting on the [facility] with NT present to start session.  Worked on completion of toilet hygiene in standing with max assist including donning new brief.  She demonstrated increased posterior lean in standing with decreased ability to correct as she would attempt to walk her feet forward instead of shifting her hips and trunk forward.  She needed max assist for transfer stand pivot from the [facility] to the wheelchair with decreased awareness of when she was not lined up with the wheelchair, as well as max instructional cueing to reach back when sitting.  Therapist took her down to the therapy gym where she completed max assist transfer with use of the RW for support to the therapy mat.  Had her complete Nine Hole Peg Test in sitting with results of 61 seconds with the right hand and 87 seconds with the left.  She was unable to follow instructions of picking up only one peg at a time though.  Next, she was able to complete sit to stand and standing balance from the EOM.  Mod assist for initial standing with max assist to maintain secondary to posterior LOB.  Pt would continue to walk her feet out in front of her to try and get her balance instead of demonstrating a forward weightshift.   Finished session with return to the wheelchair at max assist and for transfer back to the room.  She remained up in the wheelchair with the call button and phone in reach with safety alarm belt in place.  Nursing made aware that pt's bed is broken and they were able to call down to get her a new one.  Call button and phone in reach.  Throughout session pt with increased confusion with multiple statements about being ready to go home tomorrow and the need to call her son.  She is aware of her balance deficits as well as place and situation, but exhibits decreased anticipatory awareness as well as memory when told she cannot go home because of her balance and lack of 24 hr physical assist.    Therapy Documentation Precautions:  Precautions Precautions: Fall, Other (comment) Precaution Comments: pt reports 2 falls in past 1 year Restrictions Weight Bearing Restrictions: No  Pain: Pain Assessment Pain Scale: Faces Pain Score: 0-No pain ADL: See Care Tool Section for some details of mobility and selfcare   Therapy/Group: Individual Therapy  Tinie Mcgloin OTR/L 02/16/2021, 4:06 PM

## 2021-02-16 NOTE — Plan of Care (Signed)
  Problem: Consults Goal: RH STROKE PATIENT EDUCATION Description: See Patient Education module for education specifics  Outcome: Progressing Goal: Diabetes Guidelines if Diabetic/Glucose > 140 Description: If diabetic or lab glucose is > 140 mg/dl - Initiate Diabetes/Hyperglycemia Guidelines & Document Interventions  Outcome: Progressing   Problem: RH BOWEL ELIMINATION Goal: RH STG MANAGE BOWEL WITH ASSISTANCE Description: STG Manage Bowel with mod I Assistance. Outcome: Progressing   Problem: RH BLADDER ELIMINATION Goal: RH STG MANAGE BLADDER WITH ASSISTANCE Description: STG Manage Bladder With mod I Assistance Outcome: Progressing   Problem: RH SKIN INTEGRITY Goal: RH STG MAINTAIN SKIN INTEGRITY WITH ASSISTANCE Description: STG Maintain Skin Integrity With mod I Assistance. Outcome: Progressing   Problem: RH SAFETY Goal: RH STG ADHERE TO SAFETY PRECAUTIONS W/ASSISTANCE/DEVICE Description: STG Adhere to Safety Precautions With cues and reminders. Outcome: Progressing   Problem: RH KNOWLEDGE DEFICIT Goal: RH STG INCREASE KNOWLEDGE OF DIABETES Description: Pt will demonstrate understanding of medication regimen, dietary and lifestyle modifications to better control blood glucose levels with min assist using handouts and booklet provided Outcome: Progressing Goal: RH STG INCREASE KNOWLEDGE OF HYPERTENSION Description: Pt will demonstrate understanding of medication regimen, dietary and lifestyle modifications to better control blood pressure with cues/reminders using handouts and booklet provided. Outcome: Progressing Goal: RH STG INCREASE KNOWLEDGE OF STROKE PROPHYLAXIS Description: Pt will demonstrate understanding of medication regimen, dietary and lifestyle modifications to better control secondary stroke risks/prophylaxis  and prevent stroke using handouts and booklet provided independently Outcome: Progressing   Problem: RH KNOWLEDGE DEFICIT Goal: RH STG INCREASE  KNOWLEGDE OF HYPERLIPIDEMIA Description: Pt will demonstrate understanding of medication regimen, dietary and lifestyle modifications to better control HLD with cues/reminders using handouts and booklet provided. Outcome: Progressing   

## 2021-02-17 LAB — URINALYSIS, ROUTINE W REFLEX MICROSCOPIC
Bilirubin Urine: NEGATIVE
Glucose, UA: NEGATIVE mg/dL
Ketones, ur: NEGATIVE mg/dL
Nitrite: NEGATIVE
Protein, ur: NEGATIVE mg/dL
Specific Gravity, Urine: 1.008 (ref 1.005–1.030)
WBC, UA: >50 WBC/hpf — ABNORMAL HIGH (ref 0–5)
pH: 6 (ref 5.0–8.0)

## 2021-02-17 MED ORDER — TOBRAMYCIN-DEXAMETHASONE 0.3-0.1 % OP SUSP
1.0000 [drp] | Freq: Four times a day (QID) | OPHTHALMIC | Status: DC
  Administered 2021-02-17 – 2021-02-23 (×18): 1 [drp] via OPHTHALMIC
  Filled 2021-02-17: qty 2.5

## 2021-02-17 NOTE — Progress Notes (Signed)
Occupational Therapy Session Note  Patient Details  Name: Sue [name] MRN: [id number] Date of Birth: May 23, 1930  Today's Date: 02/17/2021 OT Individual Time: 7829-5621 OT Individual Time Calculation (min): 63 min    Short Term Goals: Week 2:  OT Short Term Goal 1 (Week 2): Pt will complete UB dressing with supervision for two consecutive sessions. OT Short Term Goal 2 (Week 2): Pt will complete LB bathing sit to stand from wheelchair or shower seat with min assist. OT Short Term Goal 3 (Week 2): Pt will complete LB dressing sit to stand with min assist. OT Short Term Goal 4 (Week 2): Pt will complete toilet transfers with min assist using the RW for support.  Skilled Therapeutic Interventions/Progress Updates:    Session 1: [phone])  Pt in bed to start session, completed transfer to sitting EOB with mod demonstrational cueing for rolling to the side and then transitioning to sitting.  She was then able to scoot to the EOB and complete transfer stand pivot with the RW and mod assist to the wheelchair.  Mod demonstrational cueing is needed for squaring herself up to the surface she is transferring to as she demonstrates decreased awareness of when she is not in align with the surface.  She worked on grooming tasks, bathing, and dressing at the sink.  She was able to complete UB bathing with supervision and then needed min assist for donning a pullover shirt including orientation of the shirt as well.  Pt resistant to further bathing, but did persuaded her to continue to wash her legs.  She was unable to reach her feet for washing them, so therapist provided assist.  She then needed max assist for starting her pants over her feet.  Mod assist for sit to stand to pull them up over her hips secondary to LOB.  Therapist provided total assist for donning her gripper socks.  She was oriented to place and situation but not aware of the day of the week stating "Saturday".  She is aware of her balance  deficits, but is unaware of why she cannot go home.  Re-emphasized the need for her to have 24 hr min to mod assist at home and her son has reported he is not comfortable providing assist with selfcare tasks.  Pt transferred back to the bed at mod assist to conclude session, as she did not want to stay in the bed.  Min guard for sit to supine with call button and phone in reach and safety alarm in place.     Session 2: (11:11-11:57)  Pt in bed to start spent first part of therapy discussing progress and need for 24 hr physical assist with discharge expected on 6/16.  Pt reports that she will not have 24 hour assist, but re-assured her that her son is aware of the need for 24 hr assist and will arrange accordingly.  She was able to complete supine to sit EOB but needed mod assist as she did not attempt rolling this session but instead tried to sit up with HOB up, making it more difficult for her to scoot out to the EOB.  Mod assist for scooting out on the right side with completion of sit to stand, standing balance, and functional mobility in the room for remainder of session.  She was able to complete sit to stand transitions with min assist and maintain static standing balance with min assist as well while taking one hand off of the walker for simulated clothing  management.  She was then able to complete functional mobility to the door of her room and back to the bed at min assist as well with min instructional cueing for upright posture and for staying inside of the RW as well.  She completed this X2 with rest break in-between sitting on the EOB.  She finished session with transfer back to the bed a min assist level where she was left with the call button and phone in reach and safety alarm in place.     Therapy Documentation Precautions:  Precautions Precautions: Fall, Other (comment) Precaution Comments: pt reports 2 falls in past 1 year Restrictions Weight Bearing Restrictions: No   Pain: Pain  Assessment Pain Scale: Faces Pain Score: 0-No pain Faces Pain Scale: No hurt ADL: See Care Tool Section for some details of mobility and selfcare   Therapy/Group: Individual Therapy  Sheylin Scharnhorst OTR/L 02/17/2021, 12:10 PM

## 2021-02-17 NOTE — Progress Notes (Signed)
Patient bladder scanned this morning for only . Patient unable to void despite toileting attempts. Patient cathed for 200cc of cloudy amber urine.

## 2021-02-17 NOTE — Progress Notes (Signed)
Physical Therapy Session Note  Patient Details  Name: Sue [name] MRN: [id number] Date of Birth: October 27, 1929  Today's Date: 02/17/2021 PT Individual Time: 1420-1506 PT Individual Time Calculation (min): 46 min   Short Term Goals: Week 3:  PT Short Term Goal 1 (Week 3): STG = LTG based on ELOS  Skilled Therapeutic Interventions/Progress Updates:    Pt received supine in bed watching TV and excited to see this therapist upon arrival. Pt requesting for assistance to call her son - therapist provided - pt's son encouraged participation in therapy session. Pt reporting fatigue today but after speaking with her son agreeable to participate. Supine>sitting L EOB, HOB partially elevated and using bedrail, min/mod assist to scoot hips to EOB with pt demoing poor motor planning and problem solving skills today. Throughout session, pt reports feeling concerned about the D/C plan as pt reports she would feel more comfortable/confident going to a SNF for continued skilled therapy services - therapist educated pt on plan for hands-on family education/training tomorrow and insurance limitations effecting D/C plan per SW notes.   Pt reports need to use bathroom. Sit>stand EOB>RW with heavy mod assist for lifting and balance due to strong posterior lean today. Gait training ~48ft into bathroom with heavy mod assist for balance due to continued strong posterior lean, poor AD management keeping it too far forward, and short,shuffled steps with L lateral trunk flexion. Standing with min/mod assist for balance during total assist LB clothing management and peri-care - continent of bladder and BM smear on brief. Gait training ~7ft back to room using RW with pt demoing improved balance/postural control only needing light min assist this time though continues to have poor AD management and short, shuffled steps. When turning to sit in chair/w/c pt requires max cuing for sequencing, AD management, and turning fully prior  to initiating sitting with pt having poor motor planning and execution of these cues despite manual facilitation.   Donned shoes max assist. Gait training ~170ft using RW to ortho gym with progressively heavier min assist for balance due to fatigue - continues with poor AD management pushing it too far forward with short, shuffled steps and L lateral trunk flexion. After seated rest break, pt motivated to walk back to room as opposed to thearpist retrieving w/c. Gait training back to room as just described; however, when going to turn and sit in the wheelchair patient initiates sitting prior to turning fully (more than in any other transfers) with pt also having poor ability to step laterally towards wheelchair requiring max assist to pivot hips and land safely in chair with pt having anterior trunk LOB requiring mod assist to catch herself also using the bed for support. Mod assist to scoot hips back in chair to maintain balance and safety. Pt reports not bumping her arms/legs on anything - therapist assessed with no signs of injury.  Therapist wrote in pt's memory notebook and with question cuing pt able to recall tasks performed during session. Pt left seated in w/c with needs in reach and seat belt alarm on.  Therapy Documentation Precautions:  Precautions Precautions: Fall, Other (comment) Precaution Comments: pt reports 2 falls in past 1 year Restrictions Weight Bearing Restrictions: No   Pain: Denies pain during session.   Therapy/Group: Individual Therapy  Ginny Forth , PT, DPT, CSRS 02/17/2021, 12:20 PM

## 2021-02-17 NOTE — Progress Notes (Signed)
Patient ID: Sue [name], female   DOB: [birth year], 88 y.o.   MRN: [id number]  Sw did not receive follow up from patient son about family education. Unable to schedule another session before discharge.  [place], [place] [phone]

## 2021-02-17 NOTE — Plan of Care (Signed)
Problem: RH Balance Goal: LTG Patient will maintain dynamic standing with ADLs (OT) Description: LTG:  Patient will maintain dynamic standing balance with assist during activities of daily living (OT)  Flowsheets (Taken 02/17/2021 0741) LTG: Pt will maintain dynamic standing balance during ADLs with: (goal downgraded based on slower progress than originally expected) Moderate Assistance - Patient 50 - 74% Note: goal downgraded based on slower progress than originally expected   Problem: Sit to Stand Goal: LTG:  Patient will perform sit to stand in prep for activites of daily living with assistance level (OT) Description: LTG:  Patient will perform sit to stand in prep for activites of daily living with assistance level (OT) Flowsheets (Taken 02/17/2021 0741) LTG: PT will perform sit to stand in prep for activites of daily living with assistance level: (goal downgraded based on slower progress than originally expected) Moderate Assistance - Patient 50 - 74% Note: goal downgraded based on slower progress than originally expected   Problem: RH Bathing Goal: LTG Patient will bathe all body parts with assist levels (OT) Description: LTG: Patient will bathe all body parts with assist levels (OT) Flowsheets (Taken 02/17/2021 0741) LTG: Pt will perform bathing with assistance level/cueing: (goal downgraded based on slower progress than originally expected) Moderate Assistance - Patient 50 - 74% LTG: Position pt will perform bathing: Sit to Stand   Problem: RH Dressing Goal: LTG Patient will perform lower body dressing w/assist (OT) Description: LTG: Patient will perform lower body dressing with assist, with/without cues in positioning using equipment (OT) Flowsheets (Taken 02/17/2021 0741) LTG: Pt will perform lower body dressing with assistance level of: (goal downgraded based on slower progress than originally expected) Moderate Assistance - Patient 50 - 74% Note: goal downgraded based on slower  progress than originally expected   Problem: RH Toileting Goal: LTG Patient will perform toileting task (3/3 steps) with assistance level (OT) Description: LTG: Patient will perform toileting task (3/3 steps) with assistance level (OT)  Flowsheets (Taken 02/17/2021 0741) LTG: Pt will perform toileting task (3/3 steps) with assistance level: (goal downgraded based on slower progress than originally expected) Moderate Assistance - Patient 50 - 74% Note: goal downgraded based on slower progress than originally expected   Problem: RH Toilet Transfers Goal: LTG Patient will perform toilet transfers w/assist (OT) Description: LTG: Patient will perform toilet transfers with assist, with/without cues using equipment (OT) Flowsheets (Taken 02/17/2021 0741) LTG: Pt will perform toilet transfers with assistance level of: Moderate Assistance - Patient 50 - 74% Note: goal downgraded based on slower progress than originally expected   Problem: RH Tub/Shower Transfers Goal: LTG Patient will perform tub/shower transfers w/assist (OT) Description: LTG: Patient will perform tub/shower transfers with assist, with/without cues using equipment (OT) Flowsheets (Taken 02/17/2021 0741) LTG: Pt will perform tub/shower stall transfers with assistance level of: Moderate Assistance - Patient 50 - 74% LTG: Pt will perform tub/shower transfers from: Tub/shower combination Note: goal downgraded based on slower progress than originally expected   Problem: RH Memory Goal: LTG Patient will demonstrate ability for day to day recall/carry over during activities of daily living with assistance level (OT) Description: LTG:  Patient will demonstrate ability for day to day recall/carry over during activities of daily living with assistance level (OT). Flowsheets (Taken 02/17/2021 0741) LTG:  Patient will demonstrate ability for day to day recall/carry over during activities of daily living with assistance level (OT): (goal  downgraded based on slower progress than originally expected) Moderate Assistance - Patient 50 - 74% Note: goal downgraded  based on slower progress than originally expected   Problem: RH Awareness Goal: LTG: Patient will demonstrate awareness during functional activites type of (OT) Description: LTG: Patient will demonstrate awareness during functional activites type of (OT) Flowsheets (Taken 02/17/2021 0741) LTG: Patient will demonstrate awareness during functional activites type of (OT): (goal downgraded based on slower progress than originally expected) Moderate Assistance - Patient 50 - 74% Note: goal downgraded based on slower progress than originally expected

## 2021-02-17 NOTE — Progress Notes (Signed)
Patient ID: Sue [name], female   DOB: 25-Sep-1929, 87 y.o.   MRN: [id number]  Patient eligible for 20 hours of custodial care through [facility], referral faxed to:  [facility] [phone]: phone [phone]: fax  [facility] [phone]  SW will wait for determination  [place]vera Guise, [place] [phone]

## 2021-02-17 NOTE — Progress Notes (Signed)
Speech Language Pathology Daily Session Note  Patient Details  Name: Sue [name] MRN: [id number] Date of Birth: [birth year]  Today's Date: 02/17/2021 SLP Individual Time: 0347-4259 SLP Individual Time Calculation (min): 30 min  Short Term Goals: Week 2: SLP Short Term Goal 1 (Week 2): Pt will complete mildly complex problem solving tasks for functional activities given min A verbal cues SLP Short Term Goal 2 (Week 2): Pt will demonstrate recall of novel and daily information with visual aid given min A verbal cues. SLP Short Term Goal 3 (Week 2): Pt will demonstrate self-monitoring and self-awareness of functional errors with min A verbal cues.  Skilled Therapeutic Interventions: Skilled SLP intervention focused on cognition. Pt was able identify discharge date using calendar in room. She required mod A for transfer to bathroom with FWW and mod visual cues for toilet transfer and sequencing with steps. Pt needed instructions frequently repeated due to being hard of hearing and confusion. She follwed steps to dial sons phone number with mod A verbal and visual cues.  Pt left seated upright in bed with bed alarm set and call button within reach.     Pain Pain Assessment Pain Scale: Faces Pain Score: 0-No pain Faces Pain Scale: No hurt  Therapy/Group: Individual Therapy  [name] Sue [name] 02/17/2021, 10:40 AM

## 2021-02-17 NOTE — Progress Notes (Signed)
Patient ID: Sue [name], female   DOB: 1929-12-31, 87 y.o.   MRN: [id number]  SW scheduling family education with patient son Thermon Leyland). He plans to confirm before 11AM if he would like attend an 8-12 or 1-4  session on tomorrow 6/15.  [place], [place] [phone]

## 2021-02-17 NOTE — Progress Notes (Signed)
PROGRESS NOTE   Subjective/Complaints:  Left eye still tearing , no pain  ROS-  Pt denies SOB, abd pain, CP, N/V/C/D, and vision changes   Objective:   No results found. No results for input(s): WBC, HGB, HCT, PLT in the last 72 hours.  No results for input(s): NA, K, CL, CO2, GLUCOSE, BUN, CREATININE, CALCIUM in the last 72 hours.   Intake/Output Summary (Last 24 hours) at 02/17/2021 0741 Last data filed at 02/17/2021 0730 Gross per 24 hour  Intake 240 ml  Output 200 ml  Net 40 ml         Physical Exam: Vital Signs Blood pressure 123/69, pulse 73, temperature 98.2 F (36.8 C), resp. rate 19, height 5\' 4"  (1.626 m), weight 83 kg, SpO2 96 %. Eyes- mild injection Left eye , +tearing , clear General: No acute distress Mood and affect are appropriate Heart: Regular rate and rhythm no rubs murmurs or extra sounds Lungs: Clear to auscultation, breathing unlabored, no rales or wheezes Abdomen: Positive bowel sounds, soft nontender to palpation, nondistended Extremities: No clubbing, cyanosis, or edema Skin: No evidence of breakdown, no evidence of rash    Neurologic: Cranial nerves II through XII intact, motor strength is 4/5 in bilateral deltoid, bicep, tricep, grip, hip flexor, knee extensors, ankle dorsiflexor and plantar flexor  Musculoskeletal: Full range of motion in all 4 extremities. No joint swelling    Assessment/Plan: 1. Functional deficits which require 3+ hours per day of interdisciplinary therapy in a comprehensive inpatient rehab setting. Physiatrist is providing close team supervision and 24 hour management of active medical problems listed below. Physiatrist and rehab team continue to assess barriers to discharge/monitor patient progress toward functional and medical goals  Care Tool:  Bathing    Body parts bathed by patient: Right arm, Left arm, Chest, Abdomen, Right upper leg, Left upper  leg, Face, Front perineal area     Body parts n/a: Right lower leg, Left lower leg, Buttocks   Bathing assist Assist Level: Minimal Assistance - Patient > 75% (seated in shower)     Upper Body Dressing/Undressing Upper body dressing   What is the patient wearing?: Pull over shirt    Upper body assist Assist Level: Minimal Assistance - Patient > 75%    Lower Body Dressing/Undressing Lower body dressing      What is the patient wearing?: Incontinence brief, Pants     Lower body assist Assist for lower body dressing: Maximal Assistance - Patient 25 - 49%     Toileting Toileting Toileting Activity did not occur and hygiene only): Refused  Toileting assist Assist for toileting: Maximal Assistance - Patient 25 - 49%     Transfers Chair/bed transfer  Transfers assist     Chair/bed transfer assist level: Maximal Assistance - Patient 25 - 49% Chair/bed transfer assistive device: Walker, Press photographer   Ambulation assist      Assist level: Minimal Assistance - Patient > 75% Assistive device: Walker-rolling Max distance: 148ft   Walk 10 feet activity   Assist     Assist level: Minimal Assistance - Patient > 75% Assistive device: Walker-rolling   Walk 50 feet activity  Assist    Assist level: Minimal Assistance - Patient > 75% Assistive device: Walker-rolling    Walk 150 feet activity   Assist Walk 150 feet activity did not occur: Safety/medical concerns  Assist level: Minimal Assistance - Patient > 75% Assistive device: Walker-rolling    Walk 10 feet on uneven surface  activity   Assist Walk 10 feet on uneven surfaces activity did not occur: Safety/medical concerns         Wheelchair     Assist Will patient use wheelchair at discharge?: No (TBD)             Wheelchair 50 feet with 2 turns activity    Assist            Wheelchair 150 feet activity     Assist           Blood pressure 123/69, pulse 73, temperature 98.2 F (36.8 C), resp. rate 19, height 5\' 4"  (1.626 m), weight 83 kg, SpO2 96 %.  Medical Problem List and Plan: 1.  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 cardioembolic has loop recorder              -patient may shower             -ELOS/Goals: 6/14. Supervision  -con't PT, OT and SLP 2.  Antithrombotics: -DVT/anticoagulation:  Pharmaceutical: Lovenox             -antiplatelet therapy: DAPT X 3 weeks followed by ASA alone.  Some bruising and petechiae on legs will D/C SCDscont Lovenox  3. Pain Management: Tylenol prn.              -add voltaren gel for right 1st MTP jt pain             -observe for pain tolerance with WB in therapy 4. Mood: LCSW to follow for evaluation and support.              -chronic depression: wellbutrin SR 150mg  daily             -antipsychotic agents: N/A 5. Neuropsych: This patient is somewhat capable of making decisions on her own behalf. 6. Skin/Wound Care: Routine pressure relief measures.  7. Fluids/Electrolytes/Nutrition: Monitor I/O. Repeat lytes nl  8. HTN: Monitor BP tid. Avoid hypoperfusion.              -resume coreg 3.125mg  BID  Vitals:   02/16/21 1948 02/17/21 0443  BP: 118/82 123/69  Pulse: 74 73  Resp: 16 19  Temp: 97.9 F (36.6 C) 98.2 F (36.8 C)  SpO2: 93% 96%  BP controlled 6/14 con't regimen 9. T2DM: Hgb A1C-6.9. Monitor BS ac/hs and use SSI for elevated BS             --Diet modification.  10. Dyslipidemia: Continue Zocor.  11. Hgb elevated, ? Polycythemia, plt up as well  12. Intermittent hypokalemia: Recheck labs in am.              -potassium supplementation- K+ 3.5 on 6/6 13.  Hypothyroid: On synthroid 112 mcg for supplement.  14. Constipation: On Miralax-->increase to bid              -continue senna-s qhs.  15. Chronic combined systolic/diastolic CHF:             -resuming coreg 3.125 and lasix 40mg  bid  (home meds) 16.  Urinary retention improved with urecholine and flomax  likely  immobility and ? Diabetic cystopathy although the Hgb A1 C is fairly low- completed macrobid for UTI 17. L eye infection? 6/11- will start Polytrim 1 drop q4 hours for presumed eye infection.  6/12- still draining- not quite as bad- con't regimen for now.  6/13 no drainage noted mild injection cont Polytrim 6/14 tearing left eye clear, still irritated will switch polytrim to tobradex 18. Gum/oropharynx discomfort  6/12- will order oragel prn asl as magic mouth with lidocaine QID.     LOS: 16 days A FACE TO FACE EVALUATION WAS PERFORMED  Erick Colace 02/17/2021, 7:41 AM

## 2021-02-17 NOTE — Progress Notes (Signed)
Patient ID: Sue [name], female   DOB: [birth year], 87 y.o.   MRN: [id number]  Patient [facility] referral sent to [facility]. Patient not active with [facility] for custodial care prior authorization.  [place], [place] [phone]

## 2021-02-17 NOTE — Plan of Care (Signed)
  Problem: Consults Goal: RH STROKE PATIENT EDUCATION Description: See Patient Education module for education specifics  Outcome: Progressing Goal: Diabetes Guidelines if Diabetic/Glucose > 140 Description: If diabetic or lab glucose is > 140 mg/dl - Initiate Diabetes/Hyperglycemia Guidelines & Document Interventions  Outcome: Progressing   Problem: RH BOWEL ELIMINATION Goal: RH STG MANAGE BOWEL WITH ASSISTANCE Description: STG Manage Bowel with mod I Assistance. Outcome: Progressing   Problem: RH SKIN INTEGRITY Goal: RH STG MAINTAIN SKIN INTEGRITY WITH ASSISTANCE Description: STG Maintain Skin Integrity With mod I Assistance. Outcome: Progressing   Problem: RH SAFETY Goal: RH STG ADHERE TO SAFETY PRECAUTIONS W/ASSISTANCE/DEVICE Description: STG Adhere to Safety Precautions With cues and reminders. Outcome: Progressing   Problem: RH KNOWLEDGE DEFICIT Goal: RH STG INCREASE KNOWLEDGE OF DIABETES Description: Pt will demonstrate understanding of medication regimen, dietary and lifestyle modifications to better control blood glucose levels with min assist using handouts and booklet provided Outcome: Progressing Goal: RH STG INCREASE KNOWLEDGE OF HYPERTENSION Description: Pt will demonstrate understanding of medication regimen, dietary and lifestyle modifications to better control blood pressure with cues/reminders using handouts and booklet provided. Outcome: Progressing Goal: RH STG INCREASE KNOWLEDGE OF STROKE PROPHYLAXIS Description: Pt will demonstrate understanding of medication regimen, dietary and lifestyle modifications to better control secondary stroke risks/prophylaxis  and prevent stroke using handouts and booklet provided independently Outcome: Progressing   Problem: RH KNOWLEDGE DEFICIT Goal: RH STG INCREASE KNOWLEGDE OF HYPERLIPIDEMIA Description: Pt will demonstrate understanding of medication regimen, dietary and lifestyle modifications to better control HLD with  cues/reminders using handouts and booklet provided. Outcome: Progressing   Problem: RH BLADDER ELIMINATION Goal: RH STG MANAGE BLADDER WITH ASSISTANCE Description: STG Manage Bladder With mod I Assistance Outcome: Not Progressing

## 2021-02-18 MED ORDER — NITROFURANTOIN MONOHYD MACRO 100 MG PO CAPS
100.0000 mg | ORAL_CAPSULE | Freq: Two times a day (BID) | ORAL | Status: DC
  Administered 2021-02-18 – 2021-02-23 (×11): 100 mg via ORAL
  Filled 2021-02-18 (×11): qty 1

## 2021-02-18 MED ORDER — SULFAMETHOXAZOLE-TRIMETHOPRIM 800-160 MG PO TABS: 1.0000 | ORAL_TABLET | Freq: Two times a day (BID) | ORAL | Status: DC

## 2021-02-18 MED ORDER — BETHANECHOL CHLORIDE 10 MG PO TABS
10.0000 mg | ORAL_TABLET | Freq: Four times a day (QID) | ORAL | Status: DC
  Administered 2021-02-18 – 2021-02-22 (×11): 10 mg via ORAL
  Filled 2021-02-18 (×13): qty 1

## 2021-02-18 NOTE — Progress Notes (Signed)
Physical Therapy Discharge Summary  Patient Details  Name: Sue [name] MRN: [id number] Date of Birth: 1930/06/08   Patient has met 4 of 7 long term goals due to increased strength, decreased pain, functional use of  right upper extremity and right lower extremity, and improved coordination.  Patient to discharge at a wheelchair level [place].   Patient's care partner unavailable to provide the necessary physical and cognitive assistance at discharge.  Reasons goals not met: Patient has had fluctuating participation in therapy with significantly impaired insight into deficits, balance, righting reactions and gait mechanics. Functional limitations compounded by recurrent UTIs, which may have effected her overall cognition/orientation. Patient with borderline agitation at times impacting her ability to fully participate in therapy.   Family was not present for scheduled family ed prior to dc and has not been able to demonstrate safety/competency with physically assisting patient at the current level she is at.   Recommendation:  Patient will benefit from ongoing skilled PT services in  Therapy recommending SNF due to limited/no caregiver support/availability at home, but limited in options due to insurance  -if SNF is not an option, patient will benefit from [facility] services to continue to advance safe functional mobility, address ongoing impairments in midline orientation, righting reactions, dynamic/ambulatory balance, transfers, functional strength, awareness of deficits, and minimize fall risk.  Equipment: RW, manual wc  Reasons for discharge: discharge from hospital  Patient/family agrees with progress made and goals achieved: No  PT Discharge Precautions/Restrictions Precautions Precautions: Fall;Other (comment) Precaution Comments: pt reports 2 falls in past 1 year Restrictions Weight Bearing Restrictions: No Vision/Perception  Perception Perception: Impaired Spatial  Orientation: impaired upright, midline orientation Praxis Praxis: Impaired Praxis Impairment Details: Motor planning  Cognition Overall Cognitive Status: Impaired/Different from baseline Arousal/Alertness: Awake/alert Orientation Level: Oriented to person;Oriented to place Focused Attention: Impaired Sustained Attention: Impaired Selective Attention: Impaired Memory: Impaired Awareness: Impaired Problem Solving: Impaired Safety/Judgment: Impaired Sensation Sensation Light Touch: Appears Intact Hot/Cold: Appears Intact Proprioception: Impaired by gross assessment Stereognosis: Appears Intact Coordination Gross Motor Movements are Fluid and Coordinated: No Fine Motor Movements are Fluid and Coordinated: No Coordination and Movement Description: gross motor movements impaired due to poor balance and R LE weakness Heel Shin Test: slightly decreased ROM R LE Motor  Motor Motor: Hemiplegia;Other (comment);Abnormal postural alignment and control Motor - Discharge Observations: Decreased strength R >L, decreased midline orientation  Mobility Bed Mobility Bed Mobility: Supine to Sit;Sit to Supine;Rolling Left;Rolling Right Rolling Right: Minimal Assistance - Patient > 75% Rolling Left: Minimal Assistance - Patient > 75% Supine to Sit: Moderate Assistance - Patient 50-74% Sit to Supine: Moderate Assistance - Patient 50-74% Transfers Transfers: Sit to Stand;Stand to Sit;Stand Pivot Transfers Sit to Stand: Moderate Assistance - Patient 50-74% Stand to Sit: Moderate Assistance - Patient 50-74% Stand Pivot Transfers: Moderate Assistance - Patient 50 - 74% Stand Pivot Transfer Details: Verbal cues for precautions/safety;Verbal cues for gait pattern;Verbal cues for technique;Manual facilitation for weight shifting;Manual facilitation for placement;Tactile cues for placement;Tactile cues for weight shifting;Tactile cues for sequencing;Tactile cues for posture;Verbal cues for  sequencing;Verbal cues for safe use of DME/AE Transfer (Assistive device): Rolling walker Locomotion  Gait Ambulation: Yes Gait Assistance: Minimal Assistance - Patient > 75%;Moderate Assistance - Patient 50-74% Gait Distance (Feet): 50 Feet Assistive device: Rolling walker Gait Assistance Details: Tactile cues for weight shifting;Tactile cues for sequencing;Tactile cues for posture;Verbal cues for safe use of DME/AE;Verbal cues for gait pattern;Verbal cues for technique;Manual facilitation for weight shifting Gait Gait: Yes Gait Pattern:  Impaired Gait Pattern: Step-through pattern;Decreased step length - left;Decreased step length - right;Decreased stride length;Narrow base of support (posterior lean) Gait velocity: decreased Stairs / Additional Locomotion Stairs: No Wheelchair Mobility Wheelchair Mobility: No  Trunk/Postural Assessment  Cervical Assessment Cervical Assessment: Exceptions to [facility] Thoracic Assessment Thoracic Assessment: Exceptions to [facility] Lumbar Assessment Lumbar Assessment: Exceptions to [facility] Postural Control Postural Control: Deficits on evaluation Trunk Control: impaired with repeated posterior lean/LOB sitting EOB Righting Reactions: delayed and insufficient to prevent posterior trunk LOB without max assist Protective Responses: delayed and inadequate  Balance Balance Balance Assessed: Yes Static Sitting Balance Static Sitting - Balance Support: Feet supported Static Sitting - Level of Assistance: 4: Min assist Dynamic Sitting Balance Dynamic Sitting - Balance Support: During functional activity Dynamic Sitting - Level of Assistance: 3: Mod assist Static Standing Balance Static Standing - Balance Support: During functional activity;Bilateral upper extremity supported Static Standing - Level of Assistance: 3: Mod assist Dynamic Standing Balance Dynamic Standing - Balance Support: During functional activity Dynamic Standing - Level of Assistance: 3: Mod  assist Extremity Assessment      RLE Assessment RLE Assessment: Exceptions to [facility] Active Range of Motion (AROM) Comments: WFL RLE Strength Right Hip Flexion: 3+/5 Right Knee Flexion: 3+/5 Right Knee Extension: 4-/5 Right Ankle Dorsiflexion: 4-/5 Right Ankle Plantar Flexion: 4-/5 LLE Assessment LLE Assessment: Exceptions to [facility] Active Range of Motion (AROM) Comments: WFL LLE Strength Left Hip Flexion: 4/5 Left Knee Flexion: 4/5 Left Knee Extension: 4+/5 Left Ankle Dorsiflexion: 4/5 Left Ankle Plantar Flexion: 4/5    Debbora Dus 02/18/2021, 1:45 PM

## 2021-02-18 NOTE — Progress Notes (Signed)
PROGRESS NOTE   Subjective/Complaints:  Pt asking to have the CNA who cathed her today , "cath my husband " (pt widowed) Pt states she has had UTIs when she was at home   ROS-  Pt denies SOB, abd pain, CP, N/V/C/D, and vision changes   Objective:   No results found. No results for input(s): WBC, HGB, HCT, PLT in the last 72 hours.  No results for input(s): NA, K, CL, CO2, GLUCOSE, BUN, CREATININE, CALCIUM in the last 72 hours.   Intake/Output Summary (Last 24 hours) at 02/18/2021 0801 Last data filed at 02/18/2021 0559 Gross per 24 hour  Intake 260 ml  Output 900 ml  Net -640 ml         Physical Exam: Vital Signs Blood pressure 122/81, pulse 75, temperature 98.2 F (36.8 C), resp. rate 18, height 5\' 4"  (1.626 m), weight 83 kg, SpO2 98 %. Eyes- mild injection Left eye , +tearing , clear General: No acute distress Mood and affect are appropriate Heart: Regular rate and rhythm no rubs murmurs or extra sounds Lungs: Clear to auscultation, breathing unlabored, no rales or wheezes Abdomen: Positive bowel sounds, soft nontender to palpation, nondistended Extremities: No clubbing, cyanosis, or edema Skin: No evidence of breakdown, no evidence of rash    Neurologic: Cranial nerves II through XII intact, motor strength is 4/5 in bilateral deltoid, bicep, tricep, grip, hip flexor, knee extensors, ankle dorsiflexor and plantar flexor  Musculoskeletal: Full range of motion in all 4 extremities. No joint swelling    Assessment/Plan: 1. Functional deficits which require 3+ hours per day of interdisciplinary therapy in a comprehensive inpatient rehab setting. Physiatrist is providing close team supervision and 24 hour management of active medical problems listed below. Physiatrist and rehab team continue to assess barriers to discharge/monitor patient progress toward functional and medical goals  Care Tool:  Bathing     Body parts bathed by patient: Right arm, Left arm, Chest, Abdomen, Right upper leg, Left upper leg, Face   Body parts bathed by helper: Left lower leg, Right lower leg Body parts n/a: Front perineal area, Buttocks (did not attempt)   Bathing assist Assist Level: Minimal Assistance - Patient > 75%     Upper Body Dressing/Undressing Upper body dressing   What is the patient wearing?: Pull over shirt    Upper body assist Assist Level: Moderate Assistance - Patient 50 - 74%    Lower Body Dressing/Undressing Lower body dressing      What is the patient wearing?: Pants     Lower body assist Assist for lower body dressing: Maximal Assistance - Patient 25 - 49%     Toileting Toileting Toileting Activity did not occur and hygiene only): Refused  Toileting assist Assist for toileting: Maximal Assistance - Patient 25 - 49%     Transfers Chair/bed transfer  Transfers assist     Chair/bed transfer assist level: Moderate Assistance - Patient 50 - 74% Chair/bed transfer assistive device: Walker, Press photographer   Ambulation assist      Assist level: Moderate Assistance - Patient 50 - 74% (varied min-mod A) Assistive device: Walker-rolling Max distance: 15' (up to  190ft but required mod A in room environment)   Walk 10 feet activity   Assist     Assist level: Moderate Assistance - Patient - 50 - 74% Assistive device: Walker-rolling   Walk 50 feet activity   Assist    Assist level: Minimal Assistance - Patient > 75% Assistive device: Walker-rolling    Walk 150 feet activity   Assist Walk 150 feet activity did not occur: Safety/medical concerns  Assist level: Minimal Assistance - Patient > 75% Assistive device: Walker-rolling    Walk 10 feet on uneven surface  activity   Assist Walk 10 feet on uneven surfaces activity did not occur: Safety/medical concerns         Wheelchair     Assist Will patient use  wheelchair at discharge?: No (TBD)             Wheelchair 50 feet with 2 turns activity    Assist            Wheelchair 150 feet activity     Assist          Blood pressure 122/81, pulse 75, temperature 98.2 F (36.8 C), resp. rate 18, height 5\' 4"  (1.626 m), weight 83 kg, SpO2 98 %.  Medical Problem List and Plan: 1.  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 cardioembolic has loop recorder              -patient may shower             -ELOS/Goals: 6/14. Supervision  -con't PT, OT and SLP 2.  Antithrombotics: -DVT/anticoagulation:  Pharmaceutical: Lovenox             -antiplatelet therapy: DAPT X 3 weeks followed by ASA alone.  Some bruising and petechiae on legs will D/C SCDscont Lovenox  3. Pain Management: Tylenol prn.              -add voltaren gel for right 1st MTP jt pain             -observe for pain tolerance with WB in therapy 4. Mood: LCSW to follow for evaluation and support.              -chronic depression: wellbutrin SR 150mg  daily             -antipsychotic agents: N/A 5. Neuropsych: This patient is somewhat capable of making decisions on her own behalf. 6. Skin/Wound Care: Routine pressure relief measures.  7. Fluids/Electrolytes/Nutrition: Monitor I/O. Repeat lytes nl  8. HTN: Monitor BP tid. Avoid hypoperfusion.              -resume coreg 3.125mg  BID  Vitals:   02/18/21 0502 02/18/21 0748  BP: 119/76 122/81  Pulse: 66 75  Resp: 18   Temp: 98.2 F (36.8 C)   SpO2: 98%   BP controlled 6/14 con't regimen 9. T2DM: Hgb A1C-6.9. Monitor BS ac/hs and use SSI for elevated BS             --Diet modification.  10. Dyslipidemia: Continue Zocor.  11. Hgb elevated, ? Polycythemia, plt up as well  12. Intermittent hypokalemia: Recheck labs in am.              -potassium supplementation- K+ 3.5 on 6/6 13.  Hypothyroid: On synthroid 112 mcg for supplement.  14. Constipation: On  Miralax-->increase to bid              -continue senna-s  qhs.  15. Chronic combined systolic/diastolic CHF:             -resuming coreg 3.125 and lasix 40mg  bid (home meds) 16.  Urinary retention improved with urecholine and flomax  likely immobility and ? Diabetic cystopathy has recurred with current UTI completed macrobid for UTI Will resume pending cx- given prior hx consider suppressive tx, will need OP uro f/u 17. L eye tearing- improved  6/11- will start Polytrim 1 drop q4 hours for presumed eye infection.  6/12- still draining- not quite as bad- con't regimen for now.  6/13 no drainage noted mild injection cont Polytrim 6/15 improved since switch polytrim to tobradex 18. Gum/oropharynx discomfort  6/12- will order oragel prn asl as magic mouth with lidocaine QID.     LOS: 17 days A FACE TO FACE EVALUATION WAS PERFORMED  8/12 02/18/2021, 8:01 AM

## 2021-02-18 NOTE — Progress Notes (Addendum)
Patient ID: Sue [name], female   DOB: 1930/04/30, 87 y.o.   MRN: [id number]  Patient accepted by [facility]  [place], [place] [phone]

## 2021-02-18 NOTE — Patient Care Conference (Signed)
Inpatient RehabilitationTeam Conference and Plan of Care Update Date: 02/18/2021   Time: 10:53 AM    Patient Name: Sue [name]      Medical Record Number: [id number]  Date of Birth: 1930-04-18 Sex: Female         Room/Bed: 4M01C/4M01C-01 Payor Info: Payor: Arna Medici ADVANTAGE / Plan: [employer] HMO / Product Type: *No Product type* /    Admit Date/Time:  02/01/2021  3:56 PM  Primary Diagnosis:  Embolic stroke [facility])  Hospital Problems: Principal Problem:   Embolic stroke [facility])    Expected Discharge Date: Expected Discharge Date: 02/19/21  Team Members Present: Physician leading conference: Dr. Claudette Laws Care Coodinator Present: Chana Bode, RN, BSN, CRRN;Christina [place], BSW Nurse Present: Chana Bode, RN PT Present: Merry Lofty, PT OT Present: Perrin Maltese, OT SLP Present: Feliberto Gottron, SLP PPS Coordinator present : Edson Snowball, PT     Current Status/Progress Goal Weekly Team Focus  Bowel/Bladder   Patient required I/O cath twice yesterday after no void >6hr. Mostly continent bowel with occassional incontinent episode LBM 6/14.  Patient will be continent Bowel and Bladder  Offer toileting q2hr.   Swallow/Nutrition/ Hydration             ADL's   min assist for UB selfcare with mod to max for LB selfcare sit to stand.  Mod to max for stand pivot transfers as she varies with LOB posteriorly.  Increased confusion with decreased awareness.  downgraded to mod assist level overall  selfcare retraining, balance retraining, transfer training, neuromuscular re-education, cognitive retraining, pt education   Mobility   requires varying levels of assistance due to fluctuations in midline orientation and motor planning: supine<>sit with mod assist to supervision using bed features, sit<>stands using RW with min to mod assist due to posterior lean, gait up to 123ft using RW with min to mod assist for balance - has demoed improved participation in  therapy  downgraded to min/mod assist overall at ambulatory level  activity tolerance, pt education, bed mobility training, transfer training, standing tolerance and balance, gait training, motor planning and awareness, D/C planning   Communication             Safety/Cognition/ Behavioral Observations  Overall less confusion this week, Mod A to complete basic tasks in regard to basic cognitive functioning  mod A for awareness, min a for basic daily situations  basic problem solving, sustained attention   Pain   Patient denies pain  Pt will remain pain free  Assess q shift and PRN   Skin   Redness to buttocks-scheduled gerhardtt cream  Keep skin intact. No breakdown  Assess q shift and PRN. Check for incontinence-keep clean and dry     Discharge Planning:  Goal to d/c supervision, lives with son   Team Discussion: Recurrent UTI and limited intake; decreased volume for output. Continue to note patient is aware of where she is and why she is here however no carry over as to need for supervision or assistance; continue to note confusion and variable functional status.  Patient on target to meet rehab goals: Currently supervision to min assist for upper body care and mod  - max assist for lower body self care. Trouble with socks and shoes. Min assist for sit to stand and min - mod assist for transfers. Note patient falls backwards with post lean and has processing issues with directions. Able to ambulate 130' with min - mod assist.   *See Care Plan and progress notes for  long and short-term goals.   Revisions to Treatment Plan:  Downgraded goals to min - mod assist   Teaching Needs: Safety, transfers, toileting, medications, etc.   Current Barriers to Discharge: Decreased caregiver support, Home enviroment access/layout, and Behavior Unable to set up family education time with son  Possible Resolutions to Barriers: HH follow up services recommended Custodial care services  referral 24/7 supervision recommended     Medical Summary Current Status: Confusion with intermittent agitation mainly at night.  Recurrent UTI.  Recurrent urinary retention  Barriers to Discharge: Medical stability;Other (comments)  Barriers to Discharge Comments: Cognitive deficits Possible Resolutions to Barriers/Weekly Focus: Resume antibiotics, await final culture results   Continued Need for Acute Rehabilitation Level of Care: The patient requires daily medical management by a physician with specialized training in physical medicine and rehabilitation for the following reasons: Direction of a multidisciplinary physical rehabilitation program to maximize functional independence : Yes Medical management of patient stability for increased activity during participation in an intensive rehabilitation regime.: Yes Analysis of laboratory values and/or radiology reports with any subsequent need for medication adjustment and/or medical intervention. : Yes   I attest that I was present, lead the team conference, and concur with the assessment and plan of the team.   Chana Bode B 02/18/2021, 12:59 PM

## 2021-02-18 NOTE — Progress Notes (Signed)
Patient ID: Sue [name], female   DOB: 1930-04-17, 87 y.o.   MRN: [id number]  Patient declined by [facility] and Bethann Humble, [place] [phone]

## 2021-02-18 NOTE — Progress Notes (Signed)
Patient ID: Sue [name], female   DOB: 1930/06/18, 87 y.o.   MRN: [id number] Team Conference Report to Patient/Family  Team Conference discussion was reviewed with the patient and caregiver, including goals, any changes in plan of care and target discharge date.  Patient and caregiver express understanding and are not in agreement (see progress notes).  The patient has a target discharge date of 02/19/21.  Sw spoke with patient son Sue [name]) [phone]  Provided conference updates. Pt son does not agree with d/c date. Reports pt discharge date is 6/19 and he has perfect memory. Sw informed pt son that discharge was set for tomorrow 6/16, after she was extended from original discharge date of 6/14 on 6/8. Son was notified on 6/8. Son reports he will not be picking up her until 6/18. Sw informed son pt requires ambulance transfer, so we will schedule patient to transfer home tomorrow 6/16 between 9-11 AM.  Sw informed of discharge instructions of patient DME (Rolling Walker, Bedside Commode and Scientist, research (life sciences)) and to expect call in reference to DME.   Son informed of services patient arranged with at d/c. Sw still waiting on son selection of [facility] agency   Lavera Guise, [place] [phone]   Andria Rhein 02/18/2021, 2:32 PM

## 2021-02-18 NOTE — Progress Notes (Signed)
Occupational Therapy Discharge Summary  Patient Details  Name: Sue [name] MRN: [id number] Date of Birth: 04/15/1930  Today's Date: 02/18/2021 OT Individual Time: 2993-7169 OT Individual Time Calculation (min): 44 min    Patient has met 9 of 12 long term goals due to improved activity tolerance, improved balance, and improved coordination.  Patient to discharge at overall Mod Assist level.  Patient's care partner requires assistance to provide the necessary physical and cognitive assistance at discharge.    Reasons goals not met: Pt has made slower progress than anticipated with frequent UTIs slowing her gains as well as severity of her CVA.   Recommendation:  Patient will benefit from ongoing skilled OT services in home health setting to continue to advance functional skills in the area of BADL and Reduce care partner burden.  Feel pt will need 24 hr assist from family at overall mod assist level for selfcare tasks and functional transfers with use of the RW and DME.  Pt continues to exhibit fluctuating progress physically and cognitively secondary to her CVA and recurrent UTIs.  Feel she will not be safe at home alone for any period of time as she is a high fall risk.    Equipment: Tub bench, 3:1  Reasons for discharge: treatment goals met and discharge from hospital  Patient/family agrees with progress made and goals achieved: No  OT Discharge Precautions/Restrictions  Precautions Precautions: Fall;Other (comment) Precaution Comments: pt reports 2 falls in past 1 year Restrictions Weight Bearing Restrictions: No Pain Pain Assessment Pain Scale: Faces Pain Score: 0-No pain ADL ADL Eating: Set up Where Assessed-Eating: Wheelchair Grooming: Setup Where Assessed-Grooming: Wheelchair Upper Body Bathing: Supervision/safety Where Assessed-Upper Body Bathing: Wheelchair Lower Body Bathing: Moderate assistance Where Assessed-Lower Body Bathing: Standing at sink, Sitting at  sink, Wheelchair Upper Body Dressing: Supervision/safety Where Assessed-Upper Body Dressing: Wheelchair Lower Body Dressing: Maximal assistance Where Assessed-Lower Body Dressing: Wheelchair Toileting: Maximal assistance Where Assessed-Toileting: Bedside Commode Toilet Transfer: Moderate assistance Toilet Transfer Method: Stand pivot Science writer: Engineer, technical sales Transfer: Maximal assistance Tub/Shower Transfer Method: Stand pivot Tub/Shower Equipment: Facilities manager: Moderate assistance Social research officer, government Method: Public house manager with back Vision Baseline Vision/History: Wears glasses Wears Glasses: Reading only Patient Visual Report: No change from baseline Vision Assessment?: Yes Eye Alignment: Within Functional Limits Ocular Range of Motion: Within Functional Limits Alignment/Gaze Preference: Within Defined Limits Tracking/Visual Pursuits: Able to track stimulus in all quads without difficulty Visual Fields: No apparent deficits Perception  Perception: Within Functional Limits Praxis Praxis: Intact Praxis Impairment Details: Motor planning Cognition Overall Cognitive Status: Impaired/Different from baseline Arousal/Alertness: Awake/alert Attention: Sustained;Focused;Selective Focused Attention: Appears intact Sustained Attention: Impaired Sustained Attention Impairment: Verbal basic;Functional basic Selective Attention: Impaired Memory: Impaired Memory Impairment: Storage deficit;Retrieval deficit;Decreased recall of new information Problem Solving: Impaired Problem Solving Impairment: Functional basic;Verbal basic Safety/Judgment: Impaired Sensation Sensation Light Touch: Appears Intact Hot/Cold: Appears Intact Proprioception: Appears Intact Stereognosis: Not tested Additional Comments: Sensation intact in BUEs with gross assessment. Coordination Gross Motor Movements are  Fluid and Coordinated: No Fine Motor Movements are Fluid and Coordinated: No (slower FM coordination noted with Nine Hole peg test on the left compared to the right.) 9 Hole Peg Test: 58 seconds on the right and 88 seconds on the left. Motor  Motor Motor: Hemiplegia;Other (comment);Abnormal postural alignment and control Motor - Discharge Observations: Decreased strength in the RLE compared to the LLE Mobility  Bed Mobility Bed Mobility: Supine to Sit;Sit to  Supine Supine to Sit: Minimal Assistance - Patient > 75% Sit to Supine: Contact Guard/Touching assist Transfers Sit to Stand: Moderate Assistance - Patient 50-74% Stand to Sit: Moderate Assistance - Patient 50-74%  Trunk/Postural Assessment  Cervical Assessment Cervical Assessment: Exceptions to [facility] (forward head) Thoracic Assessment Thoracic Assessment: Exceptions to [facility] (thoracic rounding in sitting) Lumbar Assessment Lumbar Assessment: Exceptions to [facility] (posterior pelvic tilt with increased lumbar flexion in standing) Postural Control Postural Control: Deficits on evaluation Trunk Control: impaired with repeated posterior lean/LOB sitting EOB Righting Reactions: delayed and insufficient with frequent posterior LOB requiring max assist to correct Protective Responses: delayed and inadequate  Balance Balance Balance Assessed: Yes Static Sitting Balance Static Sitting - Balance Support: Feet supported Static Sitting - Level of Assistance: 5: Stand by assistance Dynamic Sitting Balance Dynamic Sitting - Balance Support: During functional activity Dynamic Sitting - Level of Assistance: 4: Min assist Static Standing Balance Static Standing - Balance Support: During functional activity;Bilateral upper extremity supported Static Standing - Level of Assistance: 3: Mod assist Dynamic Standing Balance Dynamic Standing - Balance Support: During functional activity Dynamic Standing - Level of Assistance: 2: Max  assist Extremity/Trunk Assessment RUE Assessment RUE Assessment: Within Functional Limits Active Range of Motion (AROM) Comments: AROM WFLS for all joints General Strength Comments: 4/5 throughout LUE Assessment LUE Assessment: Exceptions to [facility] Active Range of Motion (AROM) Comments: WFLS General Strength Comments: shoulder flexion 4/5, elbow flexion/extension 4/5, grip 4/5.  Slight decreased FM coordination compared to the right but uses the UE functionally at a non-dominant level.   Taneesha Edgin OTR/L 02/18/2021, 4:38 PM

## 2021-02-18 NOTE — Progress Notes (Addendum)
Patient bladder scanned at 0300 for 249. No void in 8hrs. Attempted to get patient up to void or use female urinal. Patient very agitated at this time. Refusing to be cathed or to use toilet. Patient states "No" "Im not going right now". Will try again once patient has calmed down.  3559 Patient brief dry and still refusing voiding attempts. Patient required cathing for 400cc.

## 2021-02-18 NOTE — Progress Notes (Signed)
Patient ID: Sue [name], female   DOB: 29-Jul-1930, 87 y.o.   MRN: [id number]  SW provided pt with a handicapped placement card application

## 2021-02-18 NOTE — Progress Notes (Signed)
Occupational Therapy Session Note  Patient Details  Name: Sue [name] MRN: [id number] Date of Birth: [birth year]  Today's Date: 02/18/2021 OT Individual Time: 4098-1191 OT Individual Time Calculation (min): 44 min    Short Term Goals: Week 2:  OT Short Term Goal 1 (Week 2): Pt will complete UB dressing with supervision for two consecutive sessions. OT Short Term Goal 2 (Week 2): Pt will complete LB bathing sit to stand from wheelchair or shower seat with min assist. OT Short Term Goal 3 (Week 2): Pt will complete LB dressing sit to stand with min assist. OT Short Term Goal 4 (Week 2): Pt will complete toilet transfers with min assist using the RW for support.  Skilled Therapeutic Interventions/Progress Updates:    Pt in bed to start session, requesting to try and transfer to the toilet.  She was able to transition from supine to sit EOB with min assist and HOB slightly elevated.  She then needed min assist to scoot forward with mod assist to stand and transfer to the [facility] with use of the RW.  Mod assist for guiding walker with mod instructional cueing for stepping back and over to the seat as she was not aligned with it.  Increased posterior lean and LOB noted.  She needed max assist to maintain her balance to complete clothing management prior to toileting.  Max assist was also needed for toilet hygiene and managing clothing.  She completed transfer to the wheelchair at [facility] assist with the RW and washed her hands at the sink.  Therapist had her remove soiled shirt and then had her donn a hospital gown with supervision before transferring back to the EOB with mod assist.  She was able to lay down from seated position with min guard assist.  Still with increased confusion regarding why we are "keeping her here".  Even though she has been told by therapist multiple times that she is going home tomorrow.  She still exhibits poor anticipatory awareness of the situation and her current state with need  for 24 hour physical assist.  She stated prior to transfer that if I gave her the walker, she could go to the bathroom by herself, even though she currently needs mod to max assist to complete task.  Call button and phone in reach with safety alarm in place.    Therapy Documentation Precautions:  Precautions Precautions: Fall, Other (comment) Precaution Comments: pt reports 2 falls in past 1 year Restrictions Weight Bearing Restrictions: No  Pain: Pain Assessment Pain Scale: Faces Pain Score: 0-No pain ADL: See Care Tool Section for some details of mobility and selfcare   Therapy/Group: Individual Therapy  Khaliyah Northrop OTR/L 02/18/2021, 4:17 PM

## 2021-02-18 NOTE — Progress Notes (Signed)
Physical Therapy Session Note  Patient Details  Name: Sue [name] MRN: [id number] Date of Birth: 26-Jan-1930  Today's Date: 02/18/2021 PT Individual Time: 1962-2297 PT Individual Time Calculation (min): 35 min  and Today's Date: 02/18/2021 PT Missed Time: 40 Minutes Missed Time Reason: Patient unwilling to participate;Increased agitation  Short Term Goals: Week 3:  PT Short Term Goal 1 (Week 3): STG = LTG based on ELOS  Skilled Therapeutic Interventions/Progress Updates:    Patient received reclined in bed, questionably agreeable to PT. She denies pain when asked. She expressed frustration about not being able to go home. PT attempting to explain to patient that while today is her last day of therapy, tomorrow she will be going home with son, Thermon Leyland. Patient reporting "he can't take care of me." PT reiterating to patient that she should receive HH services that will assist son in caring for her, but participating in therapy will allow her to be as independent and as safe as possible prior to dc. Patient then calling son and telling him that she was strapped to a chair and then to a bed. PT informing patient that there are chair/bed alarms in place for her safety, but that she is not being restrained. Patient with very poor awareness off deficits and current situation stating that she feels as though she should be able to walk around her room by herself. PT explaining that she is at risk for falling and injuring herself and that PT cannot allow patient to do so. Patient repeatedly asking "why are you keeping me hostage? What did I do to you?" PT attempting to explain that there is a medical team working with her in her best interest to allow her to return to home safely and that it is not solely up to this PT alone to make decisions. PT attempting to gently reorient patient and direct toward therapeutic tasks, however, patient becoming increasingly agitated and not receptive to Pts gentle  redirections. Patient declining to participate in therapy at this time and stated "I'm getting better already, just slowly, but I need to do it my way, not your way." Patient remaining in bed, bed alarm on, call light within reach, NT and RN aware of patients disposition.   Therapy Documentation Precautions:  Precautions Precautions: Fall, Other (comment) Precaution Comments: pt reports 2 falls in past 1 year Restrictions Weight Bearing Restrictions: No    Therapy/Group: Individual Therapy  Elizebeth Koller, PT, DPT, CBIS  02/18/2021, 1:29 PM

## 2021-02-18 NOTE — Progress Notes (Signed)
Speech Language Pathology Discharge Summary  Patient Details  Name: Sue [name] MRN: [id number] Date of Birth: 20-Nov-1929  Today's Date: 02/18/2021 SLP Individual Time: 1105-1130 SLP Individual Time Calculation (min): 25 min   Skilled Therapeutic Interventions:  Skilled treatment session focused on cognitive goals. Upon arrival, patient was awake in bed and requested to get up to "go outside." SLP provided passive orientation without success as patient believed her bathroom door was a door to an outdoor space. Patient eventually agreeable to get into the recliner and required Mod verbal cues for problem solving while donning pants. Mod verbal cues were also needed for problem solving during the transfer to the recliner via the Stedy. Patient requested to watch television and was able to utilize the remote efficiently with overall Mod A verbal and visual cues. Patient left upright in recliner with alarm on and all needs within reach. Continue with current plan of care.    Patient has met 2 of 4 long term goals.  Patient to discharge at overall Mod level.   Reasons goals not met: Patient continues to require overall Mod A verbal and visual cues for functional problem solving and recall   Clinical Impression/Discharge Summary: Patient has made minimal and inconsistent functional gains and has met 2 of 4 LTGs this admission. Currently, patient requires overall Mod A multimodal cues to complete functional and familiar tasks safely in basic problem solving, sustained attention and emergent awareness. However, max A multimodal cues is needed for recall of functional information. Education is complete and patient will discharge home with 24 hour supervision from family. Patient would benefit from f/u SLP services to maximize her cognitive functioning and overall functional independence in order to reduce caregiver burden.   Care Partner:  Caregiver Able to Provide Assistance: Yes  Type of Caregiver  Assistance: Physical;Cognitive  Recommendation:  Home Health SLP;24 hour supervision/assistance  Rationale for SLP Follow Up: Maximize cognitive function and independence   Equipment: N/A   Reasons for discharge: Discharged from hospital   Patient/Family Agrees with Progress Made and Goals Achieved: Yes    [place], [place] 02/18/2021, 6:47 AM

## 2021-02-18 NOTE — Progress Notes (Signed)
Physical Therapy Session Note  Patient Details  Name: Sue [name] MRN: [id number] Date of Birth: 11/17/1929  Today's Date: 02/18/2021 PT Missed Time: 60 Minutes Missed Time Reason: Patient fatigue;Patient unwilling to participate  Short Term Goals: Week 3:  PT Short Term Goal 1 (Week 3): STG = LTG based on ELOS  Skilled Therapeutic Interventions/Progress Updates:    Pt received supine in bed with MD present for morning assessment and pt finishing breakfast. Pt requesting to "rest a while" since she just finished her meal. Despite attempts at encouraging pt to participate in therapy pt continues to decline at this time stating she wants to rest after eating.   Therapist returned to pt's room 30 minutes later and pt still supine in bed and immediately upon seeing therapist pt states "I'm not ready yet, I'm still relaxing." Pt continues to decline therapy at this time. Missed 60 minutes of skilled physical therapy.  Therapy Documentation Precautions:  Precautions Precautions: Fall, Other (comment) Precaution Comments: pt reports 2 falls in past 1 year Restrictions Weight Bearing Restrictions: No   Pain:  No reports of pain.   Therapy/Group: Individual Therapy  Ginny Forth , PT, DPT, CSRS 02/18/2021, 7:46 AM

## 2021-02-18 NOTE — Progress Notes (Signed)
Patient ID: Sue [name], female   DOB: 1929-09-08, 87 y.o.   MRN: [id number]  Bedside Commode, Rolling Walker, Medical laboratory scientific officer ordered through [employer].  [place], [place] [phone]

## 2021-02-18 NOTE — Progress Notes (Signed)
Patient ID: AYUMI WANGERIN, female   DOB: Aug 14, 1930, 87 y.o.   MRN: [id number] Team Conference Report to Patient/Family  Team Conference discussion was reviewed with the patient and caregiver, including goals, any changes in plan of care and target discharge date.  Patient and caregiver express understanding and are in agreement.  The patient has a target discharge date of 02/19/21.   SW called pt son to provide team conference updates. NO answer, left VM. Had message to discuss updates with pt daughter Aram Beecham), called , left VM.   *Sw received follow up call from patient daughter, daughter reports SW should be receiving a call from SNF. SW informed pt daughter this was still not an option due to insurance barrier and this was dicussed with her brother Thermon Leyland) prior to admission. Pt daughter states "well we won't be picking her up tomorrow".   Andria Rhein 02/18/2021, 11:17 AM

## 2021-02-18 NOTE — Progress Notes (Signed)
Patient ID: Sue [name], female   DOB: 10-Nov-1929, 87 y.o.   MRN: [id number]  Patient declined by [facility]

## 2021-02-18 NOTE — Progress Notes (Signed)
Patient ID: Sue [name], female   DOB: Feb 06, 1930, 87 y.o.   MRN: [id number]  No follow up from family on [facility] selection.  Patient referral sent to [facility] for review.  [place], [place] [phone]

## 2021-02-19 DIAGNOSIS — F0151 Vascular dementia with behavioral disturbance: Secondary | ICD-10-CM

## 2021-02-19 DIAGNOSIS — N3000 Acute cystitis without hematuria: Secondary | ICD-10-CM

## 2021-02-19 MED ORDER — LORAZEPAM 0.5 MG PO TABS
0.5000 mg | ORAL_TABLET | Freq: Once | ORAL | Status: DC
  Filled 2021-02-19 (×2): qty 1

## 2021-02-19 MED ORDER — CHLORHEXIDINE GLUCONATE CLOTH 2 % EX PADS
6.0000 | MEDICATED_PAD | Freq: Every day | CUTANEOUS | Status: DC
  Administered 2021-02-20 (×3): 6 via TOPICAL

## 2021-02-19 NOTE — Progress Notes (Signed)
Patient ID: Sue [name], female   DOB: [birth year], 87 y.o.   MRN: [id number]  Patient PTAR transportation scheduled Monday 6/20 at [facility] [place], [place] [phone]

## 2021-02-19 NOTE — Progress Notes (Signed)
Physical Therapy Session Note  Patient Details  Name: Sue [name] MRN: [id number] Date of Birth: 1930-05-11  Today's Date: 02/19/2021 PT Individual Time: 1306-1330 PT Individual Time Calculation (min): 24 min   Short Term Goals: Week 3:  PT Short Term Goal 1 (Week 3): STG = LTG based on ELOS  Skilled Therapeutic Interventions/Progress Updates:    Pt received sitting in recliner asking therapist whether or not she is able to go home, stating she feels like she is being "held hostage". Therapist educated pt on updated D/C plan to go home on Monday 6/20 based on chart review and confirmation with nursing. RN notified therapist that pt still needing internal catheter placed - therapist reinforced RN education regarding need for catheter due to pt with recurrent UTI and likely urinary retention. With education, pt agreeable for catheter placement. Sit>stand recliner>RW with pt demoing recall for need to scoot hips forward and proper hand placement on RW and armrest - mod assist for lifting to stand and balance due to posterior lean. Short distance ~50ft ambulatory transfer recliner>EOB using RW with pt requiring max cuing for sequencing and AD management with heavy mod assist for balance and directing RW due to posterior lean. Sit>supine, HOB partially elevated and using bedrail, with supervision. In supine, doffed pants from hooklying position with pt able to minimally bridge for hip clearance. Per pt's request, therapist remained in room to hold her hand while catheter being placed (unbilled time). Once RN complete and pt comfortable/relaxed, thearpist exiting room and left pt supine in bed in the care of RN.    Therapy Documentation Precautions:  Precautions Precautions: Fall, Other (comment) Precaution Comments: pt reports 2 falls in past 1 year Restrictions Weight Bearing Restrictions: No   Pain:  Discomfort associated with catheter placement but otherwise no complaints of pain.     Therapy/Group: Individual Therapy  Ginny Forth , PT, DPT, CSRS 02/19/2021, 4:13 PM

## 2021-02-19 NOTE — Progress Notes (Signed)
Pt took Macrobid at 1230 with son's encouragement. refused all other meds today. Refused to drink fluid stating she does not trust any of [place] and she will not take anything we give her. Only ate 10% of lunch and refused to eat dinner stating she is not hungry. Pt agreeable to foley care and CHG bath. Currently resting in bed with alarm on and all needs within reach.   Marylu Lund, RN

## 2021-02-19 NOTE — Progress Notes (Signed)
Contacted urology for appointment/voiding trial after d/c. Per nurse at [facility]---they have been trying to get patient in since Jan of this year but she has cancelled multiple appts and has not returned multiple follow up calls. Appointment set for 06/29 at 9:45/to be there at 9:30 am

## 2021-02-19 NOTE — Progress Notes (Addendum)
PROGRESS NOTE   Subjective/Complaints:  Pt calm, but confused- asking for mouthwash and her husband to see her- she's widowed.  Son came in-very angry we are sending pt today when has a 2nd UTI- Has requiring multiple in/out caths and retaining urine- likely the cause of UTIs- 2 in 2 weeks per results chart.   Put on Macrodantin due to sensitive from last Urine Cx, however don't have new Cx results.   Pt said "like hell" cathing her again- will order Ativan 0.5 mg x1 to get Foley placed.   ROS-  .limited due to cognition  Objective:   No results found. No results for input(s): WBC, HGB, HCT, PLT in the last 72 hours.  No results for input(s): NA, K, CL, CO2, GLUCOSE, BUN, CREATININE, CALCIUM in the last 72 hours.   Intake/Output Summary (Last 24 hours) at 02/19/2021 0923 Last data filed at 02/18/2021 1700 Gross per 24 hour  Intake 180 ml  Output --  Net 180 ml        Physical Exam: Vital Signs Blood pressure 124/83, pulse 66, temperature 98.7 F (37.1 C), resp. rate 16, height 5\' 4"  (1.626 m), weight 83 kg, SpO2 92 %.    General: awake, alert, appropriate,  Ox1-2- calm; laying in bed; son at bedside; son very ypset; NAD HENT: conjugate gaze; oropharynx moist; L eye less injected- eyelashes crusty still CV: regular rate; no JVD Pulmonary: CTA B/L; no W/R/R- good air movement GI: soft, NT, ND, (+)BS Psychiatric: calm, but immediately revved up about getting cathed-  Neurological:Alert; Ox1 Neurologic: Cranial nerves II through XII intact, motor strength is 4/5 in bilateral deltoid, bicep, tricep, grip, hip flexor, knee extensors, ankle dorsiflexor and plantar flexor  Musculoskeletal: Full range of motion in all 4 extremities. No joint swelling    Assessment/Plan: 1. Functional deficits which require 3+ hours per day of interdisciplinary therapy in a comprehensive inpatient rehab setting. Physiatrist is  providing close team supervision and 24 hour management of active medical problems listed below. Physiatrist and rehab team continue to assess barriers to discharge/monitor patient progress toward functional and medical goals  Care Tool:  Bathing    Body parts bathed by patient: Right arm, Left arm, Chest, Abdomen, Right upper leg, Left upper leg, Face   Body parts bathed by helper: Left lower leg, Right lower leg, Front perineal area, Buttocks Body parts n/a: Front perineal area, Buttocks (did not attempt)   Bathing assist Assist Level: Maximal Assistance - Patient 24 - 49%     Upper Body Dressing/Undressing Upper body dressing   What is the patient wearing?: Pull over shirt    Upper body assist Assist Level: Supervision/Verbal cueing    Lower Body Dressing/Undressing Lower body dressing      What is the patient wearing?: Pants, Incontinence brief     Lower body assist Assist for lower body dressing: Maximal Assistance - Patient 25 - 49%     Toileting Toileting Toileting Activity did not occur (Clothing management and hygiene only): Refused  Toileting assist Assist for toileting: Maximal Assistance - Patient 25 - 49%     Transfers Chair/bed transfer  Transfers assist     Chair/bed transfer  assist level: Moderate Assistance - Patient 50 - 74% Chair/bed transfer assistive device: Armrests, Geologist, engineering   Ambulation assist      Assist level: Moderate Assistance - Patient 50 - 74% Assistive device: Walker-rolling Max distance: 15' (up to 151ft but required mod A in room environment)   Walk 10 feet activity   Assist     Assist level: Moderate Assistance - Patient - 50 - 74% Assistive device: Walker-rolling   Walk 50 feet activity   Assist    Assist level: Moderate Assistance - Patient - 50 - 74% Assistive device: Walker-rolling    Walk 150 feet activity   Assist Walk 150 feet activity did not occur: Safety/medical  concerns  Assist level: Moderate Assistance - Patient - 50 - 74% Assistive device: Walker-rolling    Walk 10 feet on uneven surface  activity   Assist Walk 10 feet on uneven surfaces activity did not occur: Safety/medical concerns         Wheelchair     Assist Will patient use wheelchair at discharge?: Yes Type of Wheelchair: Manual Wheelchair activity did not occur: Refused         Wheelchair 50 feet with 2 turns activity    Assist    Wheelchair 50 feet with 2 turns activity did not occur: Refused       Wheelchair 150 feet activity     Assist  Wheelchair 150 feet activity did not occur: Refused       Blood pressure 124/83, pulse 66, temperature 98.7 F (37.1 C), resp. rate 16, height 5\' 4"  (1.626 m), weight 83 kg, SpO2 92 %.  Medical Problem List and Plan: 1.  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 cardioembolic has loop recorder              -patient may shower             -ELOS/Goals: 6/14. Supervision  -con't PT, OT and SLP- will hold d/c due to medical issues- UTI that's new and waiting for Cx- also urinary retention.  2.  Antithrombotics: -DVT/anticoagulation:  Pharmaceutical: Lovenox             -antiplatelet therapy: DAPT X 3 weeks followed by ASA alone.  Some bruising and petechiae on legs will D/C SCDscont Lovenox  3. Pain Management: Tylenol prn.              -add voltaren gel for right 1st MTP jt pain             -observe for pain tolerance with WB in therapy 4. Mood: LCSW to follow for evaluation and support.              -chronic depression: wellbutrin SR 150mg  daily             -antipsychotic agents: N/A 5. Neuropsych: This patient is somewhat capable of making decisions on her own behalf. 6. Skin/Wound Care: Routine pressure relief measures.  7. Fluids/Electrolytes/Nutrition: Monitor I/O. Repeat lytes nl  8. HTN: Monitor BP tid. Avoid hypoperfusion.               -resume coreg 3.125mg  BID  Vitals:   02/18/21 1943 02/19/21 0601  BP: (!) 139/95 124/83  Pulse: 72 66  Resp: 18 16  Temp: 97.8 F (36.6 C) 98.7 F (37.1 C)  SpO2: 93% 92%  BP controlled 6/16- con't regimen 9. T2DM: Hgb A1C-6.9. Monitor BS ac/hs and use  SSI for elevated BS  6/16- Bgs 95-174- but all are under 150 except x1 value- con't regimen             --Diet modification.  10. Dyslipidemia: Continue Zocor.  11. Hgb elevated, ? Polycythemia, plt up as well  12. Intermittent hypokalemia: Recheck labs in am.              -potassium supplementation- K+ 3.5 on 6/6  6/16- will recheck in AM 13.  Hypothyroid: On synthroid 112 mcg for supplement.  14. Constipation: On Miralax-->increase to bid              -continue senna-s qhs.  15. Chronic combined systolic/diastolic CHF:             -resuming coreg 3.125 and lasix 40mg  bid (home meds) 16.  Urinary retention improved with urecholine and flomax  likely immobility and ? Diabetic cystopathy has recurred with current UTI completed macrobid for UTI Will resume pending cx- given prior hx consider suppressive tx, will need OP uro f/u  6/16- pending Cx- with continued urinary retention, will place Foley- son said there's no way can in/out cath her- esp due to nursing saying anatomy off-  17. L eye tearing- improved  6/11- will start Polytrim 1 drop q4 hours for presumed eye infection.  6/12- still draining- not quite as bad- con't regimen for now.  6/13 no drainage noted mild injection cont Polytrim 6/15 improved since switch polytrim to tobradex 18. Gum/oropharynx discomfort  6/12- will order oragel prn asl as magic mouth with lidocaine QID.    I spent a total of 50 minutes on care- talking with son about foley, and urinary retention- and trying to convince pt to get foley- she continues to refuse- is very confused- insists we call her docotr- who's on vacation- said "you've done things to me- so I won't do it, even if a course says I  have to".  Called son back to bedside to d/w pt and see if possible to get her buy-in to this situation- >50% on coordination of care.    LOS: 18 days A FACE TO FACE EVALUATION WAS PERFORMED  Sue [name] 02/19/2021, 9:23 AM

## 2021-02-19 NOTE — Progress Notes (Signed)
Inpatient Rehabilitation Care Coordinator Discharge Note  The overall goal for the admission was met for:   Discharge location: Yes, home with son  Length of Stay: Yes, 22 Days  Discharge activity level: Yes, Min to Max A  Home/community participation: Yes  Services provided included: MD, RD, PT, OT, SLP, RN, CM, TR, Pharmacy, Neuropsych, and SW  Financial Services: Private Insurance: [employer]   Choices offered to/list presented to: pt son Virgel Bouquet)  Follow-up services arranged: Home Health: [facility]   Comments (or additional information): PT OT [facility] [place], Bedside Commode, Tub Producer, television/film/video, Wheelchair  Patient/Family verbalized understanding of follow-up arrangements: Yes  Individual responsible for coordination of the follow-up plan: Virgel Bouquet [phone]  Confirmed correct DME delivered: Dyanne Iha 02/19/2021    Dyanne Iha

## 2021-02-19 NOTE — Progress Notes (Signed)
Pt alert and oriented times one at this time. Pt able to tell me her name but insists she is in room 101 and that her closet is in the room next door. She is unable to be reoriented. Pt strongly encouraged to take her medication multiple times. RN spent greater than one hour with patient encouraging patient to take medications. Pt refused because she said that she doesn't trust her. Pt wanted to see a copy of her license. Pt explained she didn't trust [place] because we were all just trying to keep her here. Pt insists that her bladder infection is gone and she doesn't need to take her antibiotic. Pt also refusing to allow catheter to be placed. Pt states " over her dead body will we place a catheter in her" MD at bedside too attempting to get patient to take her medication. Pt still refusing. Pt perseverating on leaving  and having [place] pack her belongings. Son was contacted and he is coming back to the hospital to talk to patient. This writer is currently sitting with her in the room to ensure patient is safe as she is refusing all safety measures.

## 2021-02-19 NOTE — Progress Notes (Signed)
Pt's son is currently at bedside with patient. Pt still refusing to place safety belt on. Pt up to chair. Son requested to speak to patient alone and RN left patient in chair with son present. Pt not currently attempting to get up. Son would like to encourage patient to take antibiotic. Will continue to stay nearby for assistance.

## 2021-02-20 LAB — CBC WITH DIFFERENTIAL/PLATELET
Abs Immature Granulocytes: 0.09 10*3/uL — ABNORMAL HIGH (ref 0.00–0.07)
Basophils Absolute: 0.1 10*3/uL (ref 0.0–0.1)
Basophils Relative: 1 %
Eosinophils Absolute: 0.3 10*3/uL (ref 0.0–0.5)
Eosinophils Relative: 3 %
HCT: 52.3 % — ABNORMAL HIGH (ref 36.0–46.0)
Hemoglobin: 17.4 g/dL — ABNORMAL HIGH (ref 12.0–15.0)
Immature Granulocytes: 1 %
Lymphocytes Relative: 20 %
Lymphs Abs: 1.9 10*3/uL (ref 0.7–4.0)
MCH: 32 pg (ref 26.0–34.0)
MCHC: 33.3 g/dL (ref 30.0–36.0)
MCV: 96.3 fL (ref 80.0–100.0)
Monocytes Absolute: 0.9 10*3/uL (ref 0.1–1.0)
Monocytes Relative: 9 %
Neutro Abs: 6.5 10*3/uL (ref 1.7–7.7)
Neutrophils Relative %: 66 %
Platelets: 508 10*3/uL — ABNORMAL HIGH (ref 150–400)
RBC: 5.43 MIL/uL — ABNORMAL HIGH (ref 3.87–5.11)
RDW: 15.1 % (ref 11.5–15.5)
WBC: 9.8 10*3/uL (ref 4.0–10.5)
nRBC: 0 % (ref 0.0–0.2)

## 2021-02-20 LAB — URINE CULTURE: Culture: >=100000 — AB

## 2021-02-20 LAB — BASIC METABOLIC PANEL
Anion gap: 13 (ref 5–15)
BUN: 13 mg/dL (ref 8–23)
CO2: 27 mmol/L (ref 22–32)
Calcium: 9.7 mg/dL (ref 8.9–10.3)
Chloride: 97 mmol/L — ABNORMAL LOW (ref 98–111)
Creatinine, Ser: 1.02 mg/dL — ABNORMAL HIGH (ref 0.44–1.00)
GFR, Estimated: 53 mL/min — ABNORMAL LOW (ref >60)
Glucose, Bld: 117 mg/dL — ABNORMAL HIGH (ref 70–99)
Potassium: 3.6 mmol/L (ref 3.5–5.1)
Sodium: 137 mmol/L (ref 135–145)

## 2021-02-20 MED ORDER — CHLORHEXIDINE GLUCONATE CLOTH 2 % EX PADS
6.0000 | MEDICATED_PAD | Freq: Two times a day (BID) | CUTANEOUS | Status: DC
Start: 2021-02-21 — End: 2021-02-23
  Administered 2021-02-21 – 2021-02-23 (×5): 6 via TOPICAL

## 2021-02-20 NOTE — Progress Notes (Signed)
PROGRESS NOTE   Subjective/Complaints:  Pt reports was woken up at 5:30am- denies any issues.  Per nursing, drank and ate for night nurse and took night meds with no issues.   ROS-  Limited due to cognition, sleepiness  Objective:   No results found. Recent Labs    02/20/21 0514  WBC 9.8  HGB 17.4*  HCT 52.3*  PLT 508*    Recent Labs    02/20/21 0514  NA 137  K 3.6  CL 97*  CO2 27  GLUCOSE 117*  BUN 13  CREATININE 1.02*  CALCIUM 9.7     Intake/Output Summary (Last 24 hours) at 02/20/2021 0837 Last data filed at 02/20/2021 4010 Gross per 24 hour  Intake 510 ml  Output 450 ml  Net 60 ml        Physical Exam: Vital Signs Blood pressure 122/69, pulse 68, temperature 97.8 F (36.6 C), resp. rate 18, height 5\' 4"  (1.626 m), weight 83 kg, SpO2 93 %.     General: sleepy- laying supine in bed; NAD HENT: conjugate gaze; oropharynx moist still- not dry CV: regular rate; no JVD Pulmonary: CTA B/L; no W/R/R- good air movement GI: soft, NT, ND, (+)BS Psychiatric: very sleepy- doesn't remember me from yesterday Neurological: Ox1- but sleepy- said never seen me before.   Neurologic: Cranial nerves II through XII intact, motor strength is 4/5 in bilateral deltoid, bicep, tricep, grip, hip flexor, knee extensors, ankle dorsiflexor and plantar flexor  Musculoskeletal: Full range of motion in all 4 extremities. No joint swelling    Assessment/Plan: 1. Functional deficits which require 3+ hours per day of interdisciplinary therapy in a comprehensive inpatient rehab setting. Physiatrist is providing close team supervision and 24 hour management of active medical problems listed below. Physiatrist and rehab team continue to assess barriers to discharge/monitor patient progress toward functional and medical goals  Care Tool:  Bathing    Body parts bathed by patient: Right arm, Left arm, Chest, Abdomen, Right  upper leg, Left upper leg, Face   Body parts bathed by helper: Left lower leg, Right lower leg, Front perineal area, Buttocks Body parts n/a: Front perineal area, Buttocks (did not attempt)   Bathing assist Assist Level: Maximal Assistance - Patient 24 - 49%     Upper Body Dressing/Undressing Upper body dressing   What is the patient wearing?: Pull over shirt    Upper body assist Assist Level: Supervision/Verbal cueing    Lower Body Dressing/Undressing Lower body dressing      What is the patient wearing?: Pants, Incontinence brief     Lower body assist Assist for lower body dressing: Maximal Assistance - Patient 25 - 49%     Toileting Toileting Toileting Activity did not occur (Clothing management and hygiene only): Refused  Toileting assist Assist for toileting: Maximal Assistance - Patient 25 - 49%     Transfers Chair/bed transfer  Transfers assist     Chair/bed transfer assist level: Moderate Assistance - Patient 50 - 74% Chair/bed transfer assistive device: Walker,   Ambulation assist      Assist level: Moderate Assistance - Patient 50 - 74% Assistive device: Walker-rolling Max  distance: 64ft   Walk 10 feet activity   Assist     Assist level: Moderate Assistance - Patient - 50 - 74% Assistive device: Walker-rolling   Walk 50 feet activity   Assist    Assist level: Moderate Assistance - Patient - 50 - 74% Assistive device: Walker-rolling    Walk 150 feet activity   Assist Walk 150 feet activity did not occur: Safety/medical concerns  Assist level: Moderate Assistance - Patient - 50 - 74% Assistive device: Walker-rolling    Walk 10 feet on uneven surface  activity   Assist Walk 10 feet on uneven surfaces activity did not occur: Safety/medical concerns         Wheelchair     Assist Will patient use wheelchair at discharge?: Yes Type of Wheelchair: Manual Wheelchair activity did not occur:  Refused         Wheelchair 50 feet with 2 turns activity    Assist    Wheelchair 50 feet with 2 turns activity did not occur: Refused       Wheelchair 150 feet activity     Assist  Wheelchair 150 feet activity did not occur: Refused       Blood pressure 122/69, pulse 68, temperature 97.8 F (36.6 C), resp. rate 18, height 5\' 4"  (1.626 m), weight 83 kg, SpO2 93 %.  Medical Problem List and Plan: 1.  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 cardioembolic has loop recorder              -patient may shower             -ELOS/Goals: 6/14. Supervision  -con't PT, OT and SLP- will hold d/c due to medical issues- UTI that's new and waiting for Cx- also urinary retention.   -con't PT, OT and SLP-  2.  Antithrombotics: -DVT/anticoagulation:  Pharmaceutical: Lovenox             -antiplatelet therapy: DAPT X 3 weeks followed by ASA alone.  Some bruising and petechiae on legs will D/C SCDscont Lovenox  3. Pain Management: Tylenol prn.              -add voltaren gel for right 1st MTP jt pain             -observe for pain tolerance with WB in therapy 4. Mood: LCSW to follow for evaluation and support.              -chronic depression: wellbutrin SR 150mg  daily             -antipsychotic agents: N/A 5. Neuropsych: This patient is somewhat capable of making decisions on her own behalf. 6. Skin/Wound Care: Routine pressure relief measures.  7. Fluids/Electrolytes/Nutrition: Monitor I/O. Repeat lytes nl  8. HTN: Monitor BP tid. Avoid hypoperfusion.              -resume coreg 3.125mg  BID  Vitals:   02/19/21 1931 02/20/21 0523  BP: (!) 126/97 122/69  Pulse: 73 68  Resp: 18 18  Temp: 97.7 F (36.5 C) 97.8 F (36.6 C)  SpO2: 99% 93%  6/17- BP controlled- con't regimen 9. T2DM: Hgb A1C-6.9. Monitor BS ac/hs and use SSI for elevated BS  6/16- Bgs 95-174- but all are under 150 except x1 value- con't regimen              --Diet modification.  10. Dyslipidemia: Continue Zocor.  11. Hgb elevated, ? Polycythemia, plt  up as well  12. Intermittent hypokalemia: Recheck labs in am.              -potassium supplementation- K+ 3.5 on 6/6  6/17- K+ 3.6- con't to monitor 13.  Hypothyroid: On synthroid 112 mcg for supplement.  14. Constipation: On Miralax-->increase to bid              -continue senna-s qhs.  15. Chronic combined systolic/diastolic CHF:             -resuming coreg 3.125 and lasix 40mg  bid (home meds) 16.  Urinary retention improved with urecholine and flomax  likely immobility and ? Diabetic cystopathy has recurred with current UTI completed macrobid for UTI Will resume pending cx- given prior hx consider suppressive tx, will need OP uro f/u  6/16- pending Cx- with continued urinary retention, will place Foley- son said there's no way can in/out cath her- esp due to nursing saying anatomy off-   6/17- will schedule Urology appt outpt- on Macrobid- Is Ecoli and sensitive to everything on list-  17. L eye tearing- improved  6/11- will start Polytrim 1 drop q4 hours for presumed eye infection.  6/12- still draining- not quite as bad- con't regimen for now.  6/13 no drainage noted mild injection cont Polytrim 6/15 improved since switch polytrim to tobradex 18. Gum/oropharynx discomfort  6/12- will order oragel prn asl as magic mouth with lidocaine QID.  19. Dispo  6/17- d/c Monday.     LOS: 19 days A FACE TO FACE EVALUATION WAS PERFORMED  Sue [name] 02/20/2021, 8:37 AM

## 2021-02-20 NOTE — Progress Notes (Signed)
Physical Therapy Session Note  Patient Details  Name: Sue [name] MRN: [id number] Date of Birth: 10/03/1929  Today's Date: 02/20/2021 PT Missed Time: 30 Minutes Missed Time Reason: Patient unwilling to participate  Short Term Goals: Week 3:  PT Short Term Goal 1 (Week 3): STG = LTG based on ELOS  Skilled Therapeutic Interventions/Progress Updates:    Patient received sitting up in wc, not agreeable to PT. She was continuously asking PT to "return (her) to where (she) came from." PT attempting to gently reorient patient to location and situation, however patient was becoming increasingly frustrated that PT did not know where patient "had come from." Patient remaining up in wc, seatbelt alarm on, call light within reach.   Therapy Documentation Precautions:  Precautions Precautions: Fall, Other (comment) Precaution Comments: pt reports 2 falls in past 1 year Restrictions Weight Bearing Restrictions: No   Therapy/Group: Individual Therapy  Elizebeth Koller, PT, DPT, CBIS  02/20/2021, 7:35 AM

## 2021-02-20 NOTE — Progress Notes (Signed)
Occupational Therapy Session Note  Patient Details  Name: Sue [name] MRN: [id number] Date of Birth: 1930-01-05  Today's Date: 02/20/2021 OT Individual Time: 8295-6213 OT Individual Time Calculation (min): 30 min  and Today's Date: 02/20/2021 OT Missed Time: 15 Minutes Missed Time Reason: Patient unwilling/refused to participate without medical reason   Short Term Goals: Week 1:  OT Short Term Goal 1 (Week 1): Pt will complete UB dressing with supervision for two consecutive sessions. OT Short Term Goal 1 - Progress (Week 1): Not met OT Short Term Goal 2 (Week 1): Pt will complete LB bathing sit to stand from wheelchair or shower seat with min guard assist. OT Short Term Goal 2 - Progress (Week 1): Not met OT Short Term Goal 3 (Week 1): Pt will complete LB dressing sit to stand with min assist. OT Short Term Goal 3 - Progress (Week 1): Not met OT Short Term Goal 4 (Week 1): Pt will complete toilet transfers with min guard assist using the RW for support. OT Short Term Goal 4 - Progress (Week 1): Not met Week 2:  OT Short Term Goal 1 (Week 2): Pt will complete UB dressing with supervision for two consecutive sessions. OT Short Term Goal 2 (Week 2): Pt will complete LB bathing sit to stand from wheelchair or shower seat with min assist. OT Short Term Goal 3 (Week 2): Pt will complete LB dressing sit to stand with min assist. OT Short Term Goal 4 (Week 2): Pt will complete toilet transfers with min assist using the RW for support.    Skilled Therapeutic Interventions/Progress Updates:    Pt greeted at time of session semireclined in bed resting, initially mildly agitated d/t HOH. Donned hearing amplifiers and pt more agreeable to OT session, wanting to brush her teeth. Supine > sit CGA/Min with extended time. Stand pivot bed > wheelchair Mod A with posterior lean, difficulty correcting despite multimodal cues. Set up at sink and performed oral hygiene set up. Attempted to change clothes,  pt stating she wanted a new blouse but the shirt in her room was soiled, pt stating "wash it and bring it back to me then Ill get dressed." When explained this not feasible this am, refused further dressing/bathing tasks. Transported to gym and set up on SCIFIT level 2, tolerated for 1 minute before saying "I dont like this. Im not doing this anymore. You're wasting your time." Offered several other there ex and there act but pt refusing. Transported back to room with pt stating she would not do anymore activity. Alarm on call bell in reach. Missed 15 mins d/t refusal.   Therapy Documentation Precautions:  Precautions Precautions: Fall, Other (comment) Precaution Comments: pt reports 2 falls in past 1 year Restrictions Weight Bearing Restrictions: No     Therapy/Group: Individual Therapy  Viona Gilmore 02/20/2021, 7:22 AM

## 2021-02-20 NOTE — Progress Notes (Signed)
Occupational Therapy Weekly Progress Note  Patient Details  Name: Sue Sue [name] MRN: [id number] Date of Birth: [birth year]  Beginning of progress report period: February 10, 2021 End of progress report period: February 20, 2021  Today's Date: 02/20/2021 OT Individual Time: 4098-1191 OT Individual Time Calculation (Sue [name]): 24 Sue [name]    Patient has met 0 of 4 short term goals.  Sue Sue [name] continues to demonstrate slow and inconsistent progress with OT at this time.  UB selfcare is at supervision for bathing and Sue [name] assist to supervision for LB.  She needs mod assist for LB bathing sit to stand with mod to max for dressing.  Toilet transfers are at a mod assist level overall but at times can be anywhere from Sue [name] to max secondary to confusion likely associated with recurring UTIs.  Overall, she has not progressed as expected and is not close to her previous baseline.  She will need 24 hr Sue [name] to mod assist at discharge with plan to go home on 6/20.  Her son will be arranging 24hr care for safety.  Recommend continued CIR level therapy in order to meet overall Sue [name] to mod assist level goals.     Patient continues to demonstrate the following deficits: muscle weakness and muscle paralysis, impaired timing and sequencing, unbalanced muscle activation, and decreased coordination, decreased attention, decreased awareness, decreased problem solving, decreased safety awareness, and decreased memory, and decreased sitting balance, decreased standing balance, decreased postural control, and decreased balance strategies and therefore will continue to benefit from skilled OT intervention to enhance overall performance with BADL and Reduce care partner burden.  Patient progressing toward long term goals..  Continue plan of care.  OT Short Term Goals Week 3:  OT Short Term Goal 1 (Week 3): Continue working on [employer] at [employer].  Skilled Therapeutic Interventions/Progress Updates:    Pt in wheelchair to  start session.  She was oriented to place, but not oriented to day of the week stating "Monday".  She was also not able to state the reason for being in the hospital.  She requested to work on transferring to the bed as she states "I've been up all day and no one will let me go to bed".  She was re-directed that all she had to do is call someone and they would help her, that she was not forced to stay in the wheelchair.  She declined need to toilet and then completed stand pivot transfer to the bed from the wheelchair with mod assist.  Mod demonstrational cueing needed for turning the walker the correct way and side stepping up toward the top of the bed.  She was able to transition to supine with Sue [name] assist and bridge over into the bed.  Call button and phone in reach with safety alarm in place.    Therapy Documentation Precautions:  Precautions Precautions: Fall, Other (comment) Precaution Comments: pt reports 2 falls in past 1 year Restrictions Weight Bearing Restrictions: No  Pain: Pain Assessment Pain Scale: Faces Pain Score: 0-No pain ADL: See Care Tool Section for some details of mobility and selfcare  Therapy/Group: Individual Therapy  Elisabetta Mishra OTR/L 02/20/2021, 4:24 PM

## 2021-02-21 MED ORDER — BIOTENE DRY MOUTH MT LIQD: 15.0000 mL | OROMUCOSAL | Status: DC | PRN

## 2021-02-21 NOTE — Progress Notes (Signed)
PROGRESS NOTE   Subjective/Complaints:  Labs reviewed  Recurrent UTI , aysmptomatic except had some worsening confusion , new organism   ROS-  Limited due to cognition, sleepiness  Objective:   No results found. Recent Labs    02/20/21 0514  WBC 9.8  HGB 17.4*  HCT 52.3*  PLT 508*     Recent Labs    02/20/21 0514  NA 137  K 3.6  CL 97*  CO2 27  GLUCOSE 117*  BUN 13  CREATININE 1.02*  CALCIUM 9.7      Intake/Output Summary (Last 24 hours) at 02/21/2021 0711 Last data filed at 02/20/2021 2051 Gross per 24 hour  Intake 160 ml  Output 325 ml  Net -165 ml         Physical Exam: Vital Signs Blood pressure (!) 141/64, pulse 70, temperature 98.2 F (36.8 C), temperature source Oral, resp. rate 18, height 5\' 4"  (1.626 m), weight 83 kg, SpO2 94 %.   General: No acute distress Mood and affect are appropriate Heart: Regular rate and rhythm no rubs murmurs or extra sounds Lungs: Clear to auscultation, breathing unlabored, no rales or wheezes Abdomen: Positive bowel sounds, soft nontender to palpation, nondistended Extremities: No clubbing, cyanosis, or edema Skin: No evidence of breakdown, no evidence of rash  Neurologic: Cranial nerves II through XII intact, motor strength is 4/5 in bilateral deltoid, bicep, tricep, grip, hip flexor, knee extensors, ankle dorsiflexor and plantar flexor  Musculoskeletal: Full range of motion in all 4 extremities. No joint swelling    Assessment/Plan: 1. Functional deficits which require 3+ hours per day of interdisciplinary therapy in a comprehensive inpatient rehab setting. Physiatrist is providing close team supervision and 24 hour management of active medical problems listed below. Physiatrist and rehab team continue to assess barriers to discharge/monitor patient progress toward functional and medical goals  Care Tool:  Bathing    Body parts bathed by patient:  Right arm, Left arm, Chest, Abdomen, Right upper leg, Left upper leg, Face   Body parts bathed by helper: Left lower leg, Right lower leg, Front perineal area, Buttocks Body parts n/a: Front perineal area, Buttocks (did not attempt)   Bathing assist Assist Level: Maximal Assistance - Patient 24 - 49%     Upper Body Dressing/Undressing Upper body dressing   What is the patient wearing?: Pull over shirt    Upper body assist Assist Level: Supervision/Verbal cueing    Lower Body Dressing/Undressing Lower body dressing      What is the patient wearing?: Pants, Incontinence brief     Lower body assist Assist for lower body dressing: Maximal Assistance - Patient 25 - 49%     Toileting Toileting Toileting Activity did not occur (Clothing management and hygiene only): Refused  Toileting assist Assist for toileting: Maximal Assistance - Patient 25 - 49%     Transfers Chair/bed transfer  Transfers assist     Chair/bed transfer assist level: Moderate Assistance - Patient 50 - 74% Chair/bed transfer assistive device: Walker,   Ambulation assist      Assist level: Moderate Assistance - Patient 50 - 74% Assistive device: Walker-rolling Max distance: 42ft  Walk 10 feet activity   Assist     Assist level: Moderate Assistance - Patient - 50 - 74% Assistive device: Walker-rolling   Walk 50 feet activity   Assist    Assist level: Moderate Assistance - Patient - 50 - 74% Assistive device: Walker-rolling    Walk 150 feet activity   Assist Walk 150 feet activity did not occur: Safety/medical concerns  Assist level: Moderate Assistance - Patient - 50 - 74% Assistive device: Walker-rolling    Walk 10 feet on uneven surface  activity   Assist Walk 10 feet on uneven surfaces activity did not occur: Safety/medical concerns         Wheelchair     Assist Will patient use wheelchair at discharge?: Yes Type of Wheelchair:  Manual Wheelchair activity did not occur: Refused         Wheelchair 50 feet with 2 turns activity    Assist    Wheelchair 50 feet with 2 turns activity did not occur: Refused       Wheelchair 150 feet activity     Assist  Wheelchair 150 feet activity did not occur: Refused       Blood pressure (!) 141/64, pulse 70, temperature 98.2 F (36.8 C), temperature source Oral, resp. rate 18, height 5\' 4"  (1.626 m), weight 83 kg, SpO2 94 %.  Medical Problem List and Plan: 1.  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 cardioembolic has loop recorder  Has vascular dementia- multi infarct type             -patient may shower             -ELOS/Goals: 6/20 Supervision  -con't PT, OT and SLP- -con't PT, OT and SLP-  2.  Antithrombotics: -DVT/anticoagulation:  Pharmaceutical: Lovenox             -antiplatelet therapy: DAPT X 3 weeks followed by ASA alone.  Some bruising and petechiae on legs will D/C SCDscont Lovenox  3. Pain Management: Tylenol prn.              -add voltaren gel for right 1st MTP jt pain             -observe for pain tolerance with WB in therapy 4. Mood: LCSW to follow for evaluation and support.              -chronic depression: wellbutrin SR 150mg  daily             -antipsychotic agents: N/A 5. Neuropsych: This patient is somewhat capable of making decisions on her own behalf. 6. Skin/Wound Care: Routine pressure relief measures.  7. Fluids/Electrolytes/Nutrition: Monitor I/O. Repeat lytes nl  8. HTN: Monitor BP tid. Avoid hypoperfusion.              -resume coreg 3.125mg  BID  Vitals:   02/20/21 1949 02/21/21 0534  BP: (!) 106/92 (!) 141/64  Pulse: 75 70  Resp: 18 18  Temp: 97.8 F (36.6 C) 98.2 F (36.8 C)  SpO2: 93% 94%  6/18- BP controlled- con't regimen 9. T2DM: Hgb A1C-6.9. Monitor BS ac/hs and use SSI for elevated BS  6/16- Bgs 95-174- but all are under 150 except x1 value-  con't regimen             --Diet modification.  10. Dyslipidemia: Continue Zocor.  11. Hgb elevated, ? Polycythemia, plt up as well  12. Intermittent hypokalemia: Recheck labs in am.              -  potassium supplementation- K+ 3.5 on 6/6  6/17- K+ 3.6- con't to monitor 13.  Hypothyroid: On synthroid 112 mcg for supplement.  14. Constipation: On Miralax-->increase to bid              -continue senna-s qhs.  15. Chronic combined systolic/diastolic CHF:             -resuming coreg 3.125 and lasix 40mg  bid (home meds) 16.  Urinary retention improved with urecholine and flomax  likely immobility and ? Diabetic cystopathy has recurred with current UTI completed macrobid for UTI Will resume pending cx- given prior hx consider suppressive tx, will need OP uro f/u  6/16- pending Cx- with continued urinary retention, will place Foley- son said there's no way can in/out cath her- esp due to nursing saying anatomy off-   6/17- will schedule Urology appt outpt- on Macrobid- Is Ecoli and sensitive to everything on list-  17. L eye tearing- resolved   6/18 improved since switch polytrim to tobradex- may stop 6/20 18. Gum/oropharynx discomfort  6/12- will order oragel prn asl as magic mouth with lidocaine QID.  19. Dispo  6/17- d/c Monday.     LOS: 20 days A FACE TO FACE EVALUATION WAS PERFORMED  Monday 02/21/2021, 7:11 AM

## 2021-02-21 NOTE — Progress Notes (Signed)
Physical Therapy Session Note  Patient Details  Name: Sue [name] MRN: [id number] Date of Birth: Jul 01, 1930  Today's Date: 02/21/2021 PT Individual Time: 0811-0918 PT Individual Time Calculation (min): 67 min   Short Term Goals: Week 3:  PT Short Term Goal 1 (Week 3): STG = LTG based on ELOS  Skilled Therapeutic Interventions/Progress Updates:    Pt received supine in bed and agreeable to therapy session. Pt requests to go to bathroom for BM. Supine>sitting L EOB, HOB elevated and using bedrail, supervision with increased time to scoot hips to EOB. Sit>stand EOB>RW with heavy min assist for lifting and balance due to posterior lean. Gait ~61ft into bathroom using RW with heavy min assist for balance and AD management with pt stating "I don't feel steady" - continues to require cuing to turn fully prior to initiating sit to ensure her hips land safely on seat. Standing with min assist for balance due to posterior lean during total assist LB clothing management and peri-care; continent of BM. Pt requesting to shower. Stand pivot BSC over toilet>w/c using RW with heavy min assist for balance and max cuing for sequencing of LE stepping and AD prior to initiating sit. Stand pivot w/c<>shower bench using UE support on grab bars with min assist for balance. Pt completed showering tasks in sitting working on dynamic sitting balance with dual-task of managing shower head and sequencing bathing tasks - requires max assist to complete all bathing tasks with total assist for posterior peri-care in standing. Sitting in w/c donned gown with set-up assist. Transported back to room in w/c and seated at sink completes oral care with set-up assist - pt reports gums sore, RN notified to assess. Pt requires assist to dry hair. Pt left sitting in w/c with needs in reach and seat belt alarm on.  Therapy Documentation Precautions:  Precautions Precautions: Fall, Other (comment) Precaution Comments: pt reports 2 falls  in past 1 year Restrictions Weight Bearing Restrictions: No   Pain: No reports of pain throughout session.   Therapy/Group: Individual Therapy  Ginny Forth , PT, DPT, CSRS 02/21/2021, 7:49 AM

## 2021-02-21 NOTE — Progress Notes (Signed)
Speech Language Pathology Daily Session Note  Patient Details  Name: Sue [name] MRN: [id number] Date of Birth: 1930/02/20  Today's Date: 02/21/2021 SLP Individual Time: 0071-2197 SLP Individual Time Calculation (min): 40 min  Short Term Goals: Week 2: SLP Short Term Goal 1 (Week 2): Pt will complete mildly complex problem solving tasks for functional activities given min A verbal cues SLP Short Term Goal 2 (Week 2): Pt will demonstrate recall of novel and daily information with visual aid given min A verbal cues. SLP Short Term Goal 3 (Week 2): Pt will demonstrate self-monitoring and self-awareness of functional errors with min A verbal cues.  Skilled Therapeutic Interventions: Pt seen for skilled ST with focus on cognitive goals, pt upright in recliner and agreeable to participate. Pt oriented to time and re-educated to new discharge date utilizing calendar in room. Pt able to carryover information with mod A cues throughout session. SLP and pt reviewing and completing memory notebook to increase recall and carryover of therapeutic tasks from previous tx sessions. Pt completing simple sequencing activity for daily living tasks with 60% accuracy provided mod fading to min A cues. Pt accuracy likely impacted by decreased motivation to complete task. Pt left in chair with alarm set and all needs within reach. Cont ST POC.   Pain Pain Assessment Pain Scale: 0-10 Pain Score: 0-No pain  Therapy/Group: Individual Therapy  Tacey Ruiz 02/21/2021, 10:19 AM

## 2021-02-22 MED ORDER — BENZOCAINE 10 % MT GEL
Freq: Four times a day (QID) | OROMUCOSAL | Status: DC | PRN
  Filled 2021-02-22: qty 9

## 2021-02-22 NOTE — Progress Notes (Addendum)
Pt continues to refuse magic mouthwash. Pt continues to complain of oral pain but refuses interventions. Placed eyedrop in left eye as directed pt stated "I didn't feel it go in do it again" this nurse placed eyedrop again, pt swatted at this nurse and stated "go away". Call light in reach, bed in lowest position, bed alarm on, telesitter in place. Mylo Red, LPN

## 2021-02-22 NOTE — Progress Notes (Signed)
Occupational Therapy Note  Patient Details  Name: Sue [name] MRN: [id number] Date of Birth: 1929/11/05  Pt in bed to start OT session.  She declines getting OOB at this time.  She reports needing to go to the bathroom and is waiting on assist. Therapist states that he is willing to assist her to the bathroom as part of therapy, but she refuses.  Tried to persuade her to participate in activity as she is going home tomorrow, however she continues to adamantly refuse stating "I'm not getting OOB now, leave me alone."  Nursing made aware of situation with pt missing 60 mins of scheduled OT.   Barnie Sopko OTR/L 02/22/2021, 1:58 PM

## 2021-02-22 NOTE — Progress Notes (Addendum)
PROGRESS NOTE   Subjective/Complaints:  Upper gum pain, has upper dentures, no bleeding, is able to eat  ROS-  Neg for breathing issues no other pain c/os  Objective:   No results found. Recent Labs    02/20/21 0514  WBC 9.8  HGB 17.4*  HCT 52.3*  PLT 508*     Recent Labs    02/20/21 0514  NA 137  K 3.6  CL 97*  CO2 27  GLUCOSE 117*  BUN 13  CREATININE 1.02*  CALCIUM 9.7      Intake/Output Summary (Last 24 hours) at 02/22/2021 0912 Last data filed at 02/22/2021 0537 Gross per 24 hour  Intake 220 ml  Output 975 ml  Net -755 ml         Physical Exam: Vital Signs Blood pressure (!) 159/84, pulse 66, temperature 98.3 F (36.8 C), temperature source Oral, resp. rate 19, height 5\' 4"  (1.626 m), weight 83 kg, SpO2 94 %. ENT- with dentures removed small erythematous mildy tender area on buccal surface of right upper incisor General: No acute distress Mood and affect are appropriate Heart: Regular rate and rhythm no rubs murmurs or extra sounds Lungs: Clear to auscultation, breathing unlabored, no rales or wheezes Abdomen: Positive bowel sounds, soft nontender to palpation, nondistended Extremities: No clubbing, cyanosis, or edema Skin: No evidence of breakdown, no evidence of rash    Neurologic: unchanged motor strength is 4/5 in bilateral deltoid, bicep, tricep, grip, hip flexor, knee extensors, ankle dorsiflexor and plantar flexor  Musculoskeletal: Full range of motion in all 4 extremities. No joint swelling    Assessment/Plan: 1. Functional deficits which require 3+ hours per day of interdisciplinary therapy in a comprehensive inpatient rehab setting. Physiatrist is providing close team supervision and 24 hour management of active medical problems listed below. Physiatrist and rehab team continue to assess barriers to discharge/monitor patient progress toward functional and medical goals  Care  Tool:  Bathing    Body parts bathed by patient: Right arm, Left arm, Chest, Abdomen, Right upper leg, Left upper leg, Face   Body parts bathed by helper: Left lower leg, Right lower leg, Front perineal area, Buttocks Body parts n/a: Front perineal area, Buttocks (did not attempt)   Bathing assist Assist Level: Maximal Assistance - Patient 24 - 49%     Upper Body Dressing/Undressing Upper body dressing   What is the patient wearing?: Pull over shirt    Upper body assist Assist Level: Supervision/Verbal cueing    Lower Body Dressing/Undressing Lower body dressing      What is the patient wearing?: Pants, Incontinence brief     Lower body assist Assist for lower body dressing: Maximal Assistance - Patient 25 - 49%     Toileting Toileting Toileting Activity did not occur and hygiene only): Refused  Toileting assist Assist for toileting: Maximal Assistance - Patient 25 - 49%     Transfers Chair/bed transfer  Transfers assist     Chair/bed transfer assist level: Minimal Assistance - Patient > 75% Chair/bed transfer assistive device: Press photographer   Ambulation assist      Assist level: Minimal Assistance - Patient > 75%  Assistive device: Walker-rolling Max distance: 15ft   Walk 10 feet activity   Assist     Assist level: Moderate Assistance - Patient - 50 - 74% Assistive device: Walker-rolling   Walk 50 feet activity   Assist    Assist level: Moderate Assistance - Patient - 50 - 74% Assistive device: Walker-rolling    Walk 150 feet activity   Assist Walk 150 feet activity did not occur: Safety/medical concerns  Assist level: Moderate Assistance - Patient - 50 - 74% Assistive device: Walker-rolling    Walk 10 feet on uneven surface  activity   Assist Walk 10 feet on uneven surfaces activity did not occur: Safety/medical concerns         Wheelchair     Assist Will patient use wheelchair at  discharge?: Yes Type of Wheelchair: Manual Wheelchair activity did not occur: Refused         Wheelchair 50 feet with 2 turns activity    Assist    Wheelchair 50 feet with 2 turns activity did not occur: Refused       Wheelchair 150 feet activity     Assist  Wheelchair 150 feet activity did not occur: Refused       Blood pressure (!) 159/84, pulse 66, temperature 98.3 F (36.8 C), temperature source Oral, resp. rate 19, height 5\' 4"  (1.626 m), weight 83 kg, SpO2 94 %.  Medical Problem List and Plan: 1.  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 cardioembolic has loop recorder  Has vascular dementia- multi infarct type             -patient may shower             -ELOS/Goals: 6/20 Supervision  -con't PT, OT and SLP- -con't PT, OT and SLP-  2.  Antithrombotics: -DVT/anticoagulation:  Pharmaceutical: Lovenox             -antiplatelet therapy: DAPT X 3 weeks followed by ASA alone.  Some bruising and petechiae on legs will D/C SCDscont Lovenox  3. Pain Management: Tylenol prn.              -oral pain due to mal fitting denture, would advise not wearing upper denture except with meals for the next several days will use local anesthetic gel, f/u with DDS 4. Mood: LCSW to follow for evaluation and support.              -chronic depression: wellbutrin SR 150mg  daily             -antipsychotic agents: N/A 5. Neuropsych: This patient is somewhat capable of making decisions on her own behalf. 6. Skin/Wound Care: Routine pressure relief measures.  7. Fluids/Electrolytes/Nutrition: Monitor I/O. Repeat lytes nl  8. HTN: Monitor BP tid. Avoid hypoperfusion.              -resume coreg 3.125mg  BID  Vitals:   02/21/21 1933 02/22/21 0523  BP: 122/74 (!) 159/84  Pulse: 71 66  Resp: 17 19  Temp: 97.7 F (36.5 C) 98.3 F (36.8 C)  SpO2: 95% 94%  6/18- BP controlled- con't regimen 9. T2DM: Hgb A1C-6.9. Monitor BS  ac/hs and use SSI for elevated BS  6/16- Bgs 95-174- but all are under 150 except x1 value- con't regimen             --Diet modification.  10. Dyslipidemia: Continue Zocor.  11. Hgb elevated, ? Polycythemia, plt up as well  12.  Intermittent hypokalemia: Recheck labs in am.              -potassium supplementation- K+ 3.5 on 6/6  6/17- K+ 3.6- con't to monitor 13.  Hypothyroid: On synthroid 112 mcg for supplement.  14. Constipation: On Miralax-->increase to bid              -continue senna-s qhs.  15. Chronic combined systolic/diastolic CHF:             -resuming coreg 3.125 and lasix 40mg  bid (home meds) 16.  Urinary retention improved with urecholine and flomax  likely immobility and ? Diabetic cystopathy has recurred with recurrent UTI - stop urecholine since pt now has foley   6/17- will schedule Urology appt outpt- on Macrobid- Is Ecoli and sensitive rec 10d course 17. L eye tearing- resolved   6/18 improved since switch polytrim to tobradex- may stop 6/20 18. Gum/oropharynx discomfort  6/12- will order oragel prn asl as magic mouth with lidocaine QID.  19. Dispo d/c Monday. 6/20    LOS: 21 days A FACE TO FACE EVALUATION WAS PERFORMED  7/20 02/22/2021, 9:12 AM

## 2021-02-23 DIAGNOSIS — N390 Urinary tract infection, site not specified: Secondary | ICD-10-CM

## 2021-02-23 DIAGNOSIS — R339 Retention of urine, unspecified: Secondary | ICD-10-CM

## 2021-02-23 LAB — CBC
HCT: 49.8 % — ABNORMAL HIGH (ref 36.0–46.0)
Hemoglobin: 16.2 g/dL — ABNORMAL HIGH (ref 12.0–15.0)
MCH: 31.5 pg (ref 26.0–34.0)
MCHC: 32.5 g/dL (ref 30.0–36.0)
MCV: 96.7 fL (ref 80.0–100.0)
Platelets: 548 10*3/uL — ABNORMAL HIGH (ref 150–400)
RBC: 5.15 MIL/uL — ABNORMAL HIGH (ref 3.87–5.11)
RDW: 15.1 % (ref 11.5–15.5)
WBC: 8.5 10*3/uL (ref 4.0–10.5)
nRBC: 0 % (ref 0.0–0.2)

## 2021-02-23 LAB — BASIC METABOLIC PANEL
Anion gap: 12 (ref 5–15)
BUN: 14 mg/dL (ref 8–23)
CO2: 26 mmol/L (ref 22–32)
Calcium: 9.6 mg/dL (ref 8.9–10.3)
Chloride: 96 mmol/L — ABNORMAL LOW (ref 98–111)
Creatinine, Ser: 1.12 mg/dL — ABNORMAL HIGH (ref 0.44–1.00)
GFR, Estimated: 47 mL/min — ABNORMAL LOW (ref >60)
Glucose, Bld: 156 mg/dL — ABNORMAL HIGH (ref 70–99)
Potassium: 3.6 mmol/L (ref 3.5–5.1)
Sodium: 134 mmol/L — ABNORMAL LOW (ref 135–145)

## 2021-02-23 MED ORDER — ASPIRIN 81 MG PO CHEW: 81.0000 mg | CHEWABLE_TABLET | Freq: Every day | ORAL | 0 refills | Status: AC

## 2021-02-23 MED ORDER — BUPROPION HCL ER (SR) 150 MG PO TB12: 150.0000 mg | ORAL_TABLET | Freq: Two times a day (BID) | ORAL | 0 refills | Status: AC

## 2021-02-23 MED ORDER — DICLOFENAC SODIUM 1 % EX GEL: 2.0000 g | Freq: Three times a day (TID) | CUTANEOUS | 0 refills | Status: DC

## 2021-02-23 MED ORDER — TOBRAMYCIN-DEXAMETHASONE 0.3-0.1 % OP SUSP: 1.0000 [drp] | Freq: Four times a day (QID) | OPHTHALMIC | 0 refills | Status: DC

## 2021-02-23 MED ORDER — TAMSULOSIN HCL 0.4 MG PO CAPS: 0.4000 mg | ORAL_CAPSULE | Freq: Every day | ORAL | 0 refills | Status: DC

## 2021-02-23 MED ORDER — POTASSIUM CHLORIDE CRYS ER 20 MEQ PO TBCR: 20.0000 meq | EXTENDED_RELEASE_TABLET | Freq: Every day | ORAL | 0 refills | Status: AC

## 2021-02-23 MED ORDER — NITROFURANTOIN MONOHYD MACRO 100 MG PO CAPS: 100.0000 mg | ORAL_CAPSULE | Freq: Two times a day (BID) | ORAL | 0 refills | Status: DC

## 2021-02-23 MED ORDER — BENZOCAINE 10 % MT GEL: Freq: Four times a day (QID) | OROMUCOSAL | 0 refills | Status: DC | PRN

## 2021-02-23 MED ORDER — SIMVASTATIN 40 MG PO TABS: 40.0000 mg | ORAL_TABLET | Freq: Every day | ORAL | 0 refills | Status: AC

## 2021-02-23 MED ORDER — ACETAMINOPHEN 325 MG PO TABS: 325.0000 mg | ORAL_TABLET | ORAL | Status: AC | PRN

## 2021-02-23 MED ORDER — CARVEDILOL 3.125 MG PO TABS: 3.1250 mg | ORAL_TABLET | Freq: Two times a day (BID) | ORAL | 0 refills | Status: AC

## 2021-02-23 MED ORDER — SENNOSIDES-DOCUSATE SODIUM 8.6-50 MG PO TABS: 1.0000 | ORAL_TABLET | Freq: Every day | ORAL | 0 refills | Status: DC

## 2021-02-23 MED ORDER — PANTOPRAZOLE SODIUM 40 MG PO TBEC: 40.0000 mg | DELAYED_RELEASE_TABLET | Freq: Every day | ORAL | 0 refills | Status: DC

## 2021-02-23 MED ORDER — POLYETHYLENE GLYCOL 3350 17 G PO PACK: 17.0000 g | PACK | Freq: Every day | ORAL | 0 refills | Status: AC

## 2021-02-23 MED ORDER — LEVOTHYROXINE SODIUM 112 MCG PO TABS: 112.0000 ug | ORAL_TABLET | Freq: Every day | ORAL | 0 refills | Status: AC

## 2021-02-23 MED ORDER — QUETIAPINE FUMARATE 25 MG PO TABS: 25.0000 mg | ORAL_TABLET | Freq: Every day | ORAL | 0 refills | Status: DC

## 2021-02-23 MED ORDER — FUROSEMIDE 40 MG PO TABS: 40.0000 mg | ORAL_TABLET | Freq: Two times a day (BID) | ORAL | 0 refills | Status: AC

## 2021-02-23 MED ORDER — SENNOSIDES-DOCUSATE SODIUM 8.6-50 MG PO TABS: 1.0000 | ORAL_TABLET | Freq: Two times a day (BID) | ORAL | 0 refills | Status: DC

## 2021-02-23 NOTE — Progress Notes (Signed)
Son not here this am. Micah Flesher over discharge instructions as well as follow up care needed. He had questions about care  needed as well as UTI. Relayed that UTI being treated, she is better overall, not agitation but remains oriented to self and to place occasionally. Also reinforced again need for 24 hour care for safety as has been relayed to him multiple times by LCSW as well at therapy (has gone thorough family education)--discussed hiring care or looking into SNF (will be private pay). He does not recall talking to [place] or information about equipment ordered. Referred to [facility] who is covering this am.  Copies of instruction placed in patient belongings

## 2021-02-23 NOTE — Progress Notes (Signed)
PROGRESS NOTE   Subjective/Complaints:  Pt reports no issues- doesn't remember meeting me.  Said didn't hear/understand me when asked her if she was going home- but is arranged- going by transport?  ROS-  Limited by cognition Objective:   No results found. Recent Labs    02/23/21 0759  WBC 8.5  HGB 16.2*  HCT 49.8*  PLT 548*    Recent Labs    02/23/21 0759  NA 134*  K 3.6  CL 96*  CO2 26  GLUCOSE 156*  BUN 14  CREATININE 1.12*  CALCIUM 9.6     Intake/Output Summary (Last 24 hours) at 02/23/2021 0902 Last data filed at 02/23/2021 0700 Gross per 24 hour  Intake 345 ml  Output 500 ml  Net -155 ml        Physical Exam: Vital Signs Blood pressure 121/65, pulse 81, temperature 97.9 F (36.6 C), temperature source Oral, resp. rate 17, height 5\' 4"  (1.626 m), weight 83 kg, SpO2 93 %.    General: awake, alert, appropriate, very HOH; eating breakfasy; NAD HENT: conjugate gaze; oropharynx moist CV: regular rate; no JVD Pulmonary: CTA B/L; no W/R/R- good air movement GI: soft, NT, ND, (+)BS Psychiatric: pleasantly confused Neurological: Ox1 at most- also very HOH  Extremities: No clubbing, cyanosis, or edema Skin: No evidence of breakdown, no evidence of rash    Neurologic: unchanged motor strength is 4/5 in bilateral deltoid, bicep, tricep, grip, hip flexor, knee extensors, ankle dorsiflexor and plantar flexor  Musculoskeletal: Full range of motion in all 4 extremities. No joint swelling    Assessment/Plan: 1. Functional deficits which require 3+ hours per day of interdisciplinary therapy in a comprehensive inpatient rehab setting. Physiatrist is providing close team supervision and 24 hour management of active medical problems listed below. Physiatrist and rehab team continue to assess barriers to discharge/monitor patient progress toward functional and medical goals  Care Tool:  Bathing     Body parts bathed by patient: Right arm, Left arm, Chest, Abdomen, Right upper leg, Left upper leg, Face   Body parts bathed by helper: Left lower leg, Right lower leg, Front perineal area, Buttocks Body parts n/a: Front perineal area, Buttocks (did not attempt)   Bathing assist Assist Level: Maximal Assistance - Patient 24 - 49%     Upper Body Dressing/Undressing Upper body dressing   What is the patient wearing?: Pull over shirt    Upper body assist Assist Level: Supervision/Verbal cueing    Lower Body Dressing/Undressing Lower body dressing      What is the patient wearing?: Pants, Incontinence brief     Lower body assist Assist for lower body dressing: Maximal Assistance - Patient 25 - 49%     Toileting Toileting Toileting Activity did not occur and hygiene only): Refused  Toileting assist Assist for toileting: Maximal Assistance - Patient 25 - 49%     Transfers Chair/bed transfer  Transfers assist     Chair/bed transfer assist level: Minimal Assistance - Patient > 75% Chair/bed transfer assistive device: Press photographer   Ambulation assist      Assist level: Minimal Assistance - Patient > 75% Assistive device: Walker-rolling Max  distance: 66ft   Walk 10 feet activity   Assist     Assist level: Moderate Assistance - Patient - 50 - 74% Assistive device: Walker-rolling   Walk 50 feet activity   Assist    Assist level: Moderate Assistance - Patient - 50 - 74% Assistive device: Walker-rolling    Walk 150 feet activity   Assist Walk 150 feet activity did not occur: Safety/medical concerns  Assist level: Moderate Assistance - Patient - 50 - 74% Assistive device: Walker-rolling    Walk 10 feet on uneven surface  activity   Assist Walk 10 feet on uneven surfaces activity did not occur: Safety/medical concerns         Wheelchair     Assist Will patient use wheelchair at discharge?: Yes Type  of Wheelchair: Manual Wheelchair activity did not occur: Refused         Wheelchair 50 feet with 2 turns activity    Assist    Wheelchair 50 feet with 2 turns activity did not occur: Refused       Wheelchair 150 feet activity     Assist  Wheelchair 150 feet activity did not occur: Refused       Blood pressure 121/65, pulse 81, temperature 97.9 F (36.6 C), temperature source Oral, resp. rate 17, height 5\' 4"  (1.626 m), weight 83 kg, SpO2 93 %.  Medical Problem List and Plan: 1.  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 cardioembolic has loop recorder  Has vascular dementia- multi infarct type             -patient may shower             -ELOS/Goals: 6/20 Supervision  -con't PT, OT and SLP- -con't PT, OT and SLP-   6/20- d/c today 2.  Antithrombotics: -DVT/anticoagulation:  Pharmaceutical: Lovenox             -antiplatelet therapy: DAPT X 3 weeks followed by ASA alone.  Some bruising and petechiae on legs will D/C SCDscont Lovenox  3. Pain Management: Tylenol prn.              -oral pain due to mal fitting denture, would advise not wearing upper denture except with meals for the next several days will use local anesthetic gel, f/u with DDS 4. Mood: LCSW to follow for evaluation and support.              -chronic depression: wellbutrin SR 150mg  daily             -antipsychotic agents: N/A 5. Neuropsych: This patient is somewhat capable of making decisions on her own behalf. 6. Skin/Wound Care: Routine pressure relief measures.  7. Fluids/Electrolytes/Nutrition: Monitor I/O. Repeat lytes nl  8. HTN: Monitor BP tid. Avoid hypoperfusion.              -resume coreg 3.125mg  BID  Vitals:   02/22/21 1850 02/23/21 0526  BP: 100/66 121/65  Pulse: 72 81  Resp: 17 17  Temp: 98.7 F (37.1 C) 97.9 F (36.6 C)  SpO2: 92% 93%  6/18- BP controlled- con't regimen 9. T2DM: Hgb A1C-6.9. Monitor BS ac/hs and  use SSI for elevated BS  6/16- Bgs 95-174- but all are under 150 except x1 value- con't regimen             --Diet modification.  10. Dyslipidemia: Continue Zocor.  11. Hgb elevated, ? Polycythemia, plt up as well  12. Intermittent hypokalemia:  Recheck labs in am.              -potassium supplementation- K+ 3.5 on 6/6  6/17- K+ 3.6- con't to monitor  6/20- K+ 3.6- con't regimen 13.  Hypothyroid: On synthroid 112 mcg for supplement.  14. Constipation: On Miralax-->increase to bid              -continue senna-s qhs.  15. Chronic combined systolic/diastolic CHF:             -resuming coreg 3.125 and lasix 40mg  bid (home meds) 16.  Urinary retention improved with urecholine and flomax  likely immobility and ? Diabetic cystopathy has recurred with recurrent UTI - stop urecholine since pt now has foley   6/17- will schedule Urology appt outpt- on Macrobid- Is Ecoli and sensitive rec 10d course  6/20- will send home with foley due to urinary rentention- needs to f/u with Urology and H/H 1x/month for cath changes 17. L eye tearing- resolved   6/18 improved since switch polytrim to tobradex- may stop 6/20 18. Gum/oropharynx discomfort  6/12- will order oragel prn asl as magic mouth with lidocaine QID.  19. Dispo d/c Monday. 6/20  6/20- d/c today    LOS: 22 days A FACE TO FACE EVALUATION WAS PERFORMED  Donnivan Villena 02/23/2021, 9:02 AM

## 2021-02-23 NOTE — Progress Notes (Signed)
Patient ID: Sue [name], female   DOB: Jan 03, 1930, 87 y.o.   MRN: [id number]  SW covering for primary SW, Lavera Guise.  SW informed on pt to d/c today. [facility] ambulance scheduled for 10am per EMR. SW informed by PA-Pam that family would like follow-up about DME needed.   SW confirmed with [facility] ambulance pick up for 10am. SW received updates from Christina/[facility] who reported she spoke with pt son and was told he would follow-up about making co-pays for DME (w/c, TTB, RW, 3in1 BSC).   SW spoke with pt son Thermon Leyland [phone]) to discuss pt discharge, and how above with regard to DME as requested. SW shared speaking with liaison above about DME. He reports that he was away from the home and did not have his notes so he was unsure on if she items were similar. He reports that he is likely to need w/c, TTB, and 3in1 BSC. States that pt has a RW already. SW asked if he would have everything needed when she  gets home as DME will not be delivered to the home unless co-pays were made. He stated he thinks he could have it in place. SW suggested purchasing a transport chair from [employer]. SW reiterated [facility] ambulance pick up for 10am and to allot a 2hr window. He wanted to know if staff could call him when she is leaving. SW informed could not commit to calling this is the reason for the 2hr window in which he will need to be home.   SW informed pt assigned RN on above. D/c packet left a front desk.  Cecile Sheerer, MSW, LCSWA Office: [phone] Cell: [phone] Fax: [phone]

## 2021-02-23 NOTE — Plan of Care (Signed)
Foley placed due to urinary retention and hx of UTIs; patient sent home with foley and follow up with urology

## 2021-02-23 NOTE — Discharge Summary (Signed)
Physician Discharge Summary  Patient ID: Sue [name] MRN: [id number] DOB/AGE: [id number] 85 y.o.  Admit date: 02/01/2021 Discharge date: 02/23/2021  Discharge Diagnoses:  Principal Problem:   Embolic stroke [facility]) Active Problems:   Chronic combined systolic and diastolic heart failure (HCC)   Essential hypertension   Diabetes mellitus type 2 in obese (HCC)   UTI (urinary tract infection)   Urinary retention   Discharged Condition: stable  Significant Diagnostic Studies: MR BRAIN WO CONTRAST  Result Date: 01/26/2021 CLINICAL DATA:  Code stroke follow-up EXAM: MRI HEAD WITHOUT CONTRAST TECHNIQUE: Multiplanar, multiecho pulse sequences of the brain and surrounding structures were obtained without intravenous contrast. COMPARISON:  Recent CT imaging FINDINGS: Brain: There are areas of acute and subacute infarction involving bilateral basal ganglia and corona radiata and right occipitoparietal lobes. There is evidence of petechial hemorrhage associated with the right occipitoparietal infarct. Additional patchy foci of T2 hyperintensity in the supratentorial and pontine white matter are nonspecific but probably reflect mild chronic microvascular ischemic changes. Prominence of the ventricles and sulci reflects generalized parenchymal volume loss. There is no intracranial mass or mass effect. There is no hydrocephalus or extra-axial fluid collection. Vascular: Major vessel flow voids at the skull base are preserved. Skull and upper cervical spine: Normal marrow signal is preserved. Sinuses/Orbits: Minor mucosal thickening. No significant orbital abnormality. Other: Sella is unremarkable.  Mastoid air cells are clear. IMPRESSION: Small acute and subacute infarcts involving bilateral basal ganglia and corona radiata. Small to moderate size infarct with petechial hemorrhage involving the right occipitoparietal lobes. Mild chronic microvascular ischemic changes. Electronically Signed   By: Guadlupe Spanish M.D.   On: 01/26/2021 14:27   EP PPM/ICD IMPLANT  Result Date: 01/28/2021 SURGEON:  Loman Brooklyn, MD   PREPROCEDURE DIAGNOSIS:  Cryptogenic Stroke   POSTPROCEDURE DIAGNOSIS:  Cryptogenic Stroke    PROCEDURES:  1. Implantable loop recorder implantation   INTRODUCTION:  Sue [name] is a 87 y.o. female with a history of unexplained stroke who presents today for implantable loop implantation.  The patient has had a cryptogenic stroke.  Despite an extensive workup by neurology, no reversible causes have been identified.  she has worn telemetry during which she did not have arrhythmias.  There is significant concern for possible atrial fibrillation as the cause for the patients stroke.  The patient therefore presents today for implantable loop implantation.   DESCRIPTION OF PROCEDURE:  Informed written consent was obtained, and the patient was brought to the electrophysiology lab in a fasting state.  The patient required no sedation for the procedure today.  Mapping over the patient's chest was performed by the EP lab staff to identify the area where electrograms were most prominent for ILR recording.  This area was found to be the left parasternal region over the 3rd-4th intercostal space. The patients left chest was therefore prepped and draped in the usual sterile fashion by the EP lab staff. The skin overlying the left parasternal region was infiltrated with lidocaine for local analgesia.  A 0.5-cm incision was made over the left parasternal region over the 3rd intercostal space.  A subcutaneous ILR pocket was fashioned using a combination of sharp and blunt dissection.  A Medtronic Reveal Linq model LINQ SN RLBG implantable loop recorder was then placed into the pocket  R waves were very prominent and measured mV. EBL<1 ml.  Steri- Strips and a sterile dressing were then applied.  There were no early apparent complications.   CONCLUSIONS:  1. Successful implantation of  a Medtronic Reveal LINQ  implantable loop recorder for cryptogenic stroke  2. No early apparent complications.   DG Chest Portable 1 View  Result Date: 01/25/2021 CLINICAL DATA:  Dyspnea. EXAM: PORTABLE CHEST 1 VIEW COMPARISON:  01/09/2021 and 06/02/2018 FINDINGS: The heart size and mediastinal contours are within normal limits. Pulmonary hyperinflation again no, consistent with COPD. Both lungs are clear. Rounded opacity is again seen in the right cardiophrenic angle. This is stable in appearance, and could be due to diaphragmatic/hiatal hernia, or prominent pericardial fat pad in this location. IMPRESSION: COPD. No active lung disease. Stable rounded opacity in right cardiophrenic angle, which could be due to diaphragmatic/hiatal hernia, or prominent epicardial fat pad. Electronically Signed   By: Danae Orleans M.D.   On: 01/25/2021 16:30   CT HEAD CODE STROKE WO CONTRAST  Result Date: 01/25/2021 CLINICAL DATA:  Code stroke. Acute neuro deficit. Right-sided weakness EXAM: CT HEAD WITHOUT CONTRAST TECHNIQUE: Contiguous axial images were obtained from the base of the skull through the vertex without intravenous contrast. COMPARISON:  CT head 01/09/2021 FINDINGS: Brain: Generalized atrophy. Negative for hydrocephalus. Chronic microvascular ischemic changes in the white matter bilaterally unchanged. Chronic infarct in the right internal capsule unchanged. Hypodensity in the right occipital lobe has become lower density and better define compared to the prior study and is compatible with chronic infarct. Negative for acute cortical infarct, hemorrhage, mass Vascular: Negative for hyperdense vessel Skull: Scattered small lytic lesions in the calvarium bilaterally unchanged from the prior study. These all measure approximately 5 mm in diameter. Sinuses/Orbits: Mild mucosal edema paranasal sinuses. Other: None ASPECTS (Alberta Stroke Program Early CT Score) - Ganglionic level infarction (caudate, lentiform nuclei, internal capsule, insula,  M1-M3 cortex): 7 - Supraganglionic infarction (M4-M6 cortex): 3 Total score (0-10 with 10 being normal): 10 IMPRESSION: 1. No acute infarct identified 2. ASPECTS is 10 3. Atrophy and chronic microvascular ischemia. Chronic infarct right occipital lobe. 4. Code stroke imaging results were communicated on 01/25/2021 at 3:20 pm to provider Wilford Corner via text page 5. Multiple small lytic lesions in the calvarium. While these could be benign, consider myeloma. Electronically Signed   By: Marlan Palau M.D.   On: 01/25/2021 15:22   ECHOCARDIOGRAM LIMITED  Result Date: 01/26/2021    ECHOCARDIOGRAM LIMITED REPORT   Patient Name:   Sue [name] Date of Exam: 01/26/2021 Medical Rec #:  [id number]       Height:       64.0 in Accession #:    [id number]      Weight:       181.7 lb Date of Birth:  10/02/1929       BSA:          1.878 m Patient Age:    89 years        BP:           134/77 mmHg Patient Gender: F               HR:           67 bpm. Exam Location:  Inpatient Procedure: Limited Echo, Cardiac Doppler and Color Doppler Indications:    CVA  History:        Patient has prior history of Echocardiogram examinations, most                 recent 01/12/2021. CHF, Stroke; Risk Factors:Hypertension,                 Dyslipidemia and Diabetes.  Sonographer:    Lavenia Atlas Referring Phys: [id number] DENISE A WOLFE IMPRESSIONS  1. Left ventricular ejection fraction, by estimation, is 55 to 60%. The left ventricle has normal function. Left ventricular diastolic parameters are consistent with Grade I diastolic dysfunction (impaired relaxation).  2. Right ventricular systolic function is normal. The right ventricular size is normal. There is normal pulmonary artery systolic pressure. The estimated right ventricular systolic pressure is 30.7 mmHg.  3. The mitral valve is normal in structure. No evidence of mitral valve regurgitation. No evidence of mitral stenosis.  4. Aortic valve regurgitation is not visualized. No aortic stenosis is  present.  5. The inferior vena cava is dilated in size with >50% respiratory variability, suggesting right atrial pressure of 8 mmHg. FINDINGS  Left Ventricle: Left ventricular ejection fraction, by estimation, is 55 to 60%. The left ventricle has normal function. The left ventricular internal cavity size was normal in size. There is no left ventricular hypertrophy. Left ventricular diastolic parameters are consistent with Grade I diastolic dysfunction (impaired relaxation). Right Ventricle: The right ventricular size is normal. No increase in right ventricular wall thickness. Right ventricular systolic function is normal. There is normal pulmonary artery systolic pressure. The tricuspid regurgitant velocity is 2.38 m/s, and  with an assumed right atrial pressure of 8 mmHg, the estimated right ventricular systolic pressure is 30.7 mmHg. Left Atrium: Left atrial size was normal in size. Right Atrium: Right atrial size was normal in size. Mitral Valve: The mitral valve is normal in structure. Mild to moderate mitral annular calcification. No evidence of mitral valve stenosis. Tricuspid Valve: The tricuspid valve is normal in structure. Tricuspid valve regurgitation is trivial. Aortic Valve: Aortic valve regurgitation is not visualized. No aortic stenosis is present. Aorta: The aortic root is normal in size and structure. Venous: The inferior vena cava is dilated in size with greater than 50% respiratory variability, suggesting right atrial pressure of 8 mmHg. LEFT VENTRICLE PLAX 2D LVIDd:         4.50 cm  Diastology LVIDs:         3.10 cm  LV e' medial:    3.70 cm/s LV PW:         1.10 cm  LV E/e' medial:  14.2 LV IVS:        1.00 cm  LV e' lateral:   4.79 cm/s LVOT diam:     2.00 cm  LV E/e' lateral: 11.0 LVOT Area:     3.14 cm  RIGHT VENTRICLE RV S prime:     5.98 cm/s LEFT ATRIUM         Index LA diam:    2.90 cm 1.54 cm/m   AORTA Ao Root diam: 2.90 cm MITRAL VALVE               TRICUSPID VALVE MV Area (PHT):  2.76 cm    TR Peak grad:   22.7 mmHg MV Decel Time: 275 msec    TR Vmax:        238.00 cm/s MV E velocity: 52.70 cm/s MV A velocity: 86.50 cm/s  SHUNTS MV E/A ratio:  0.61        Systemic Diam: 2.00 cm Marca Ancona MD Electronically signed by Marca Ancona MD Signature Date/Time: 01/26/2021/5:04:18 PM    Final    CT ANGIO HEAD NECK W WO CM (CODE STROKE)  Result Date: 01/25/2021 CLINICAL DATA:  Acute neuro deficit.  Right-sided weakness. EXAM: CT ANGIOGRAPHY HEAD AND NECK TECHNIQUE: Multidetector CT imaging of  the head and neck was performed using the standard protocol during bolus administration of intravenous contrast. Multiplanar CT image reconstructions and MIPs were obtained to evaluate the vascular anatomy. Carotid stenosis measurements (when applicable) are obtained utilizing NASCET criteria, using the distal internal carotid diameter as the denominator. CONTRAST:  83mL OMNIPAQUE IOHEXOL 350 MG/ML SOLN COMPARISON:  CT head 01/25/2021 FINDINGS: CTA NECK FINDINGS Aortic arch: Mild atherosclerotic disease in the aortic arch. Proximal great vessels widely patent. Right carotid system: Right carotid widely patent without significant stenosis. Left carotid system: Left carotid widely patent without significant stenosis. Vertebral arteries: Both vertebral arteries are widely patent to the basilar without stenosis. Skeleton: Cervical spondylosis.  No acute abnormality. Other neck: Negative for mass or adenopathy in the neck. Upper chest: Lung apices clear bilaterally. Review of the MIP images confirms the above findings CTA HEAD FINDINGS Anterior circulation: Cavernous carotid widely patent bilaterally. Anterior and middle cerebral arteries patent bilaterally without stenosis or large vessel occlusion. Posterior circulation: Both vertebral arteries patent to the basilar. PICA patent bilaterally. Basilar patent with mild atherosclerotic irregularity. Anterior middle cerebral arteries patent bilaterally. Mild  stenosis in the right P2 segment. No large vessel occlusion. Venous sinuses: Normal venous enhancement. Anatomic variants: None Review of the MIP images confirms the above findings IMPRESSION: 1. Negative for intracranial large vessel occlusion 2. No significant carotid or vertebral artery stenosis in the neck. Electronically Signed   By: Marlan Palau M.D.   On: 01/25/2021 15:37    Labs:  Basic Metabolic Panel: BMP Latest Ref Rng & Units 02/23/2021 02/20/2021 02/09/2021  Glucose 70 - 99 mg/dL 188(C) 166(A) 630(Z)  BUN 8 - 23 mg/dL 14 13 19   Creatinine 0.44 - 1.00 mg/dL ) 6.01(U) 9.32(T)  Sodium 135 - 145 mmol/L 134(L) 137 135  Potassium 3.5 - 5.1 mmol/L 3.6 3.6 3.5  Chloride 98 - 111 mmol/L 96(L) 97(L) 93(L)  CO2 22 - 32 mmol/L 26 27 28   Calcium 8.9 - 10.3 mg/dL 9.6 9.7 9.6     CBC: CBC Latest Ref Rng & Units 02/23/2021 02/20/2021 02/09/2021  WBC 4.0 - 10.5 K/uL 8.5 9.8 9.6  Hemoglobin 12.0 - 15.0 g/dL 16.2(H) 17.4(H) 17.2(H)  Hematocrit 36.0 - 46.0 % 49.8(H) 52.3(H) 53.0(H)  Platelets 150 - 400 K/uL 548(H) 508(H) 666(H)     CBG: No results for input(s): GLUCAP in the last 168 hours.  Brief HPI:   Sue [name] is an 87 y.o. female 87 year old female with history of HTN, T2DM, depression who was admitted on 01/23/2021 with sudden onset of right-sided weakness and slurred speech.  CT head was done negative for acute abnormality and she received IV tPA with follow-up MRI brain showing small acute and subacute infarcts involving bilateral basal ganglia and corona radiata and small to moderate sized infarct with petechial hemorrhage involving right occipital parietal lobes.  Dr. 92 recommended loop recorder to rule out A. fib as cause of embolic stroke and this was placed on 05/25 with Dr. Pearlean Brownie.  She is to continue DAPT x3 weeks followed by ASA alone for secondary stroke prevention.  Therapy evaluations completed and patient was limited by right-sided weakness with balance deficits,  deficits in memory as well as high-level cognitive tasks.  CIR was recommended due to functional decline.   Hospital Course: Sue [name] was admitted to rehab 02/01/2021 for inpatient therapies to consist of PT, ST and OT at least three hours five days a week. Past admission physiatrist, therapy team and rehab RN have worked  together to provide customized collaborative inpatient rehab. She has been maintained on low dose ASA/Plavix for secondary stroke prevention and plavix was discontinued on 06/13.  Her blood pressures were monitored on TID basis and have been reasonably controlled.  Loop recorder incision is C/D/I and is healing well.  Carb modified restrictions were added to diet. Blood sugars were  monitored with ac/hs CBG checks. As  these have been controlled on diet alone, CBGs checks were discontinued. She did report eye pain with drainage and polytrim added but was ineffective. This was changed to ciprodex with good results. Drainage and erythema has resolved and she was advised to continue drops for another 48 hours.   Lasix and coreg were resumed due to history of combined CHF. Respiratory status has been stable and no signs of overload noted.  At admission she was found to have urinary retention requiring in and out catheterization.  As she had recently been treated for UTI she was monitored for symptoms.  She did start developing confusion with agitation on 05/30 evening with sleep wake disruption.  Her hearing loss has also compounded her confusion.  Urine culture was ordered showing greater than 100,000 colonies of Citrobacter freundii and she was treated with 5-day course of Macrobid.   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  Her voiding function did improve briefly.  She did develop recurrent urinary retention due to E. coli UTI on 06/15.  She was started on Macrobid for treatment and Foley was placed for ongoing urinary  retention.  She is to complete 10 total days of antibiotic regimen and is to follow up with urology next week for further work-up.  She has also had issues with sleep-wake disruption therefore low-dose Seroquel was added to help with sleep cycle.  Her agitation has resolved but she continues to have issues with confusion.  Follow-up check of electrolytes shows renal status to be stable and hypokalemia has resolved with addition of daily supplement.  She did report some gum oropharyngeal disease comfort due to poorly fitting dentures and Orajel was added to help with symptoms.  At discharge, family was advised to continue oral care as well as break from dentures during the day as well as follow-up with primary dentist for evaluation of dentures after discharge.  Her length of stay was extended per family concerns/request and to allow for further treatment of UTI.  She has made slow and inconsistent progress during her stay due to ongoing deficits.  She currently requires min to mod assist for most functional and familiar tasks as well as safety with mobility.  She will continue to receive follow-up home health PT, OT and ST by [facility] after discharge   Rehab course: During patient's stay in rehab weekly team conferences were held to monitor patient's progress, set goals and discuss barriers to discharge. At admission, patient required mod assist for mobility and with basic self care tasks. She displayed moderate to severe cognitive impairments affecting basic problems solving, awareness, short term recall and attention. SLUMS score of 18/30. She  has had improvement in activity tolerance, balance, postural control as well as ability to compensate for deficits.  She has made slow and inconsistent gains during her stay and has required frequent encouragement for participation in therapy.  She continues to require min assist to supervision for upper body self-care tasks, mod assist for lower body bathing  and mod to max assist for dressing.  She  requires min assist for transfers and min to mod assist ambulate 50 feet with rolling walker. She requires mod cues for carryover of information, mod multimodal cues to complete functional and familiar tasks as well as max assist for recall of functional information.  Family education was set up with son. .    Disposition: Home  Diet: heart healthy/Carb modified.   Special Instructions: 1.Family to provide supervision with medication management as well as with mobility for safety. 2.  Perform Foley care twice daily.   Allergies as of 02/23/2021   No Known Allergies      Medication List     STOP taking these medications    clopidogrel 75 MG tablet Commonly known as: PLAVIX   enoxaparin 40 MG/0.4ML injection Commonly known as: LOVENOX   Gerhardt's butt cream Crea   insulin aspart 100 UNIT/ML injection Commonly known as: novoLOG       TAKE these medications    acetaminophen 325 MG tablet Commonly known as: TYLENOL Take 1-2 tablets (325-650 mg total) by mouth every 4 (four) hours as needed for mild pain. What changed:  medication strength how much to take when to take this   aspirin 81 MG chewable tablet Chew 1 tablet (81 mg total) by mouth daily. Notes to patient: Need to purchase this over the counter   benzocaine 10 % mucosal gel Commonly known as: ORAJEL Use as directed in the mouth or throat 4 (four) times daily as needed for mouth pain. Notes to patient: Purchase over the counter   buPROPion 150 MG 12 hr tablet Commonly known as: WELLBUTRIN SR Take 1 tablet (150 mg total) by mouth 2 (two) times daily. What changed: when to take this   carvedilol 3.125 MG tablet Commonly known as: COREG Take 1 tablet (3.125 mg total) by mouth 2 (two) times daily with a meal. What changed:  how much to take when to take this   diclofenac Sodium 1 % Gel Commonly known as: VOLTAREN Apply 2 g topically 3 (three) times  daily. Notes to patient: Apply to right great toe.    furosemide 40 MG tablet Commonly known as: LASIX Take 1 tablet (40 mg total) by mouth 2 (two) times daily.   levothyroxine 112 MCG tablet Commonly known as: SYNTHROID Take 1 tablet (112 mcg total) by mouth daily before breakfast.   mouth rinse Liqd solution 15 mLs by Mouth Rinse route 2 (two) times daily.   nitrofurantoin (macrocrystal-monohydrate) 100 MG capsule Commonly known as: MACROBID Take 1 capsule (100 mg total) by mouth every 12 (twelve) hours. What changed: when to take this   pantoprazole 40 MG tablet Commonly known as: PROTONIX Take 1 tablet (40 mg total) by mouth at bedtime.   polyethylene glycol 17 g packet Commonly known as: MIRALAX / GLYCOLAX Take 17 g by mouth daily.   potassium chloride SA 20 MEQ tablet Commonly known as: KLOR-CON Take 1 tablet (20 mEq total) by mouth daily.   QUEtiapine 25 MG tablet Commonly known as: SEROQUEL Take 1 tablet (25 mg total) by mouth at bedtime.   senna-docusate 8.6-50 MG tablet Commonly known as: Senokot-S Take 1 tablet by mouth 2 (two) times daily. What changed: when to take this Notes to patient: Purchase over the counter   simvastatin 40 MG tablet Commonly known as: ZOCOR Take 1 tablet (40 mg total) by mouth daily at 6 PM.   tamsulosin 0.4 MG Caps capsule Commonly known as: FLOMAX Take 1 capsule (0.4 mg total) by mouth daily  after supper.   tobramycin-dexamethasone ophthalmic solution Commonly known as: TOBRADEX Place 1 drop into the left eye every 6 (six) hours. For 2 more days        Follow-up Information     Kirsteins, Victorino Sparrow, MD Follow up.   Specialty: Physical Medicine and Rehabilitation Why: office will call you with follow up appointment  Contact information: [address] [place] [address] [phone]         [facility]. Call.   Why: for post stroke follow up Contact information: [address]     [address] [place] [address] [phone]        Sigmund Hazel, MD. Call.   Specialty: Family Medicine Why: for post hospital follow up Contact information: [address] [place] [address] [phone]         [facility] Follow up on 03/04/2021.   Why: Be there at 9:30 am for 9:45 appointment Contact information: [address] [place] [address] [place] [address] [phone]                Signed: Jacquelynn Cree 02/23/2021, 5:06 PM

## 2021-02-23 NOTE — Progress Notes (Signed)
INPATIENT REHABILITATION DISCHARGE NOTE   Discharge instructions by: Delle Reining  Verbalized understanding: yes, by son  Skin care/Wound care: done  Pain: no pain  IV's: no IV's  Tubes/Drains: none  Safety instructions: fall risk   Patient belongings: sent with [facility]  Discharged to: home with son  Discharged via: ambulance  Notes:

## 2021-02-26 ENCOUNTER — Inpatient Hospital Stay (HOSPITAL_COMMUNITY)
Admission: EM | Admit: 2021-02-26 | Discharge: 2021-03-06 | DRG: 699 | Disposition: A | Discharge status: Home Health | Payer: HMO | Attending: Internal Medicine | Admitting: Internal Medicine

## 2021-02-26 DIAGNOSIS — N390 Urinary tract infection, site not specified: Secondary | ICD-10-CM | POA: Diagnosis present

## 2021-02-26 DIAGNOSIS — T83518A Infection and inflammatory reaction due to other urinary catheter, initial encounter: Secondary | ICD-10-CM | POA: Diagnosis not present

## 2021-02-26 DIAGNOSIS — Z7982 Long term (current) use of aspirin: Secondary | ICD-10-CM

## 2021-02-26 DIAGNOSIS — E1169 Type 2 diabetes mellitus with other specified complication: Secondary | ICD-10-CM | POA: Diagnosis present

## 2021-02-26 DIAGNOSIS — R531 Weakness: Secondary | ICD-10-CM

## 2021-02-26 DIAGNOSIS — B9620 Unspecified Escherichia coli [E. coli] as the cause of diseases classified elsewhere: Secondary | ICD-10-CM | POA: Diagnosis present

## 2021-02-26 DIAGNOSIS — Z9181 History of falling: Secondary | ICD-10-CM

## 2021-02-26 DIAGNOSIS — E785 Hyperlipidemia, unspecified: Secondary | ICD-10-CM | POA: Diagnosis present

## 2021-02-26 DIAGNOSIS — R5382 Chronic fatigue, unspecified: Secondary | ICD-10-CM | POA: Diagnosis present

## 2021-02-26 DIAGNOSIS — I69351 Hemiplegia and hemiparesis following cerebral infarction affecting right dominant side: Secondary | ICD-10-CM

## 2021-02-26 DIAGNOSIS — E1159 Type 2 diabetes mellitus with other circulatory complications: Secondary | ICD-10-CM | POA: Diagnosis present

## 2021-02-26 DIAGNOSIS — I5032 Chronic diastolic (congestive) heart failure: Secondary | ICD-10-CM | POA: Diagnosis present

## 2021-02-26 DIAGNOSIS — Z87891 Personal history of nicotine dependence: Secondary | ICD-10-CM

## 2021-02-26 DIAGNOSIS — Z79899 Other long term (current) drug therapy: Secondary | ICD-10-CM

## 2021-02-26 DIAGNOSIS — Y846 Urinary catheterization as the cause of abnormal reaction of the patient, or of later complication, without mention of misadventure at the time of the procedure: Secondary | ICD-10-CM | POA: Diagnosis present

## 2021-02-26 DIAGNOSIS — I110 Hypertensive heart disease with heart failure: Secondary | ICD-10-CM | POA: Diagnosis present

## 2021-02-26 DIAGNOSIS — H9190 Unspecified hearing loss, unspecified ear: Secondary | ICD-10-CM | POA: Diagnosis present

## 2021-02-26 DIAGNOSIS — R296 Repeated falls: Secondary | ICD-10-CM | POA: Diagnosis present

## 2021-02-26 DIAGNOSIS — R339 Retention of urine, unspecified: Secondary | ICD-10-CM | POA: Diagnosis present

## 2021-02-26 DIAGNOSIS — E039 Hypothyroidism, unspecified: Secondary | ICD-10-CM | POA: Diagnosis present

## 2021-02-26 DIAGNOSIS — Z79890 Hormone replacement therapy (postmenopausal): Secondary | ICD-10-CM

## 2021-02-26 DIAGNOSIS — Z833 Family history of diabetes mellitus: Secondary | ICD-10-CM

## 2021-02-26 DIAGNOSIS — I152 Hypertension secondary to endocrine disorders: Secondary | ICD-10-CM | POA: Diagnosis present

## 2021-02-26 DIAGNOSIS — E876 Hypokalemia: Secondary | ICD-10-CM | POA: Diagnosis not present

## 2021-02-26 DIAGNOSIS — E669 Obesity, unspecified: Secondary | ICD-10-CM | POA: Diagnosis present

## 2021-02-26 DIAGNOSIS — F32A Depression, unspecified: Secondary | ICD-10-CM | POA: Diagnosis present

## 2021-02-26 DIAGNOSIS — Z6831 Body mass index (BMI) 31.0-31.9, adult: Secondary | ICD-10-CM

## 2021-02-26 DIAGNOSIS — Z20822 Contact with and (suspected) exposure to covid-19: Secondary | ICD-10-CM | POA: Diagnosis present

## 2021-02-26 DIAGNOSIS — Z87440 Personal history of urinary (tract) infections: Secondary | ICD-10-CM

## 2021-02-26 DIAGNOSIS — K219 Gastro-esophageal reflux disease without esophagitis: Secondary | ICD-10-CM | POA: Diagnosis present

## 2021-02-26 LAB — URINALYSIS, ROUTINE W REFLEX MICROSCOPIC
Bilirubin Urine: NEGATIVE
Glucose, UA: NEGATIVE mg/dL
Ketones, ur: NEGATIVE mg/dL
Nitrite: NEGATIVE
Protein, ur: NEGATIVE mg/dL
Specific Gravity, Urine: 1.009 (ref 1.005–1.030)
WBC, UA: >50 WBC/hpf — ABNORMAL HIGH (ref 0–5)
pH: 6 (ref 5.0–8.0)

## 2021-02-26 LAB — BASIC METABOLIC PANEL
Anion gap: 8 (ref 5–15)
BUN: 12 mg/dL (ref 8–23)
CO2: 25 mmol/L (ref 22–32)
Calcium: 7.9 mg/dL — ABNORMAL LOW (ref 8.9–10.3)
Chloride: 108 mmol/L (ref 98–111)
Creatinine, Ser: 0.83 mg/dL (ref 0.44–1.00)
GFR, Estimated: >60 mL/min (ref >60)
Glucose, Bld: 114 mg/dL — ABNORMAL HIGH (ref 70–99)
Potassium: 2.5 mmol/L — CL (ref 3.5–5.1)
Sodium: 141 mmol/L (ref 135–145)

## 2021-02-26 LAB — CBC WITH DIFFERENTIAL/PLATELET
Abs Immature Granulocytes: 0.07 10*3/uL (ref 0.00–0.07)
Basophils Absolute: 0.1 10*3/uL (ref 0.0–0.1)
Basophils Relative: 1 %
Eosinophils Absolute: 0.2 10*3/uL (ref 0.0–0.5)
Eosinophils Relative: 2 %
HCT: 50.1 % — ABNORMAL HIGH (ref 36.0–46.0)
Hemoglobin: 16.7 g/dL — ABNORMAL HIGH (ref 12.0–15.0)
Immature Granulocytes: 1 %
Lymphocytes Relative: 18 %
Lymphs Abs: 2.1 10*3/uL (ref 0.7–4.0)
MCH: 32.2 pg (ref 26.0–34.0)
MCHC: 33.3 g/dL (ref 30.0–36.0)
MCV: 96.7 fL (ref 80.0–100.0)
Monocytes Absolute: 1.1 10*3/uL — ABNORMAL HIGH (ref 0.1–1.0)
Monocytes Relative: 9 %
Neutro Abs: 8.1 10*3/uL — ABNORMAL HIGH (ref 1.7–7.7)
Neutrophils Relative %: 69 %
Platelets: 528 10*3/uL — ABNORMAL HIGH (ref 150–400)
RBC: 5.18 MIL/uL — ABNORMAL HIGH (ref 3.87–5.11)
RDW: 15.2 % (ref 11.5–15.5)
WBC: 11.6 10*3/uL — ABNORMAL HIGH (ref 4.0–10.5)
nRBC: 0 % (ref 0.0–0.2)

## 2021-02-26 LAB — MAGNESIUM: Magnesium: 1.8 mg/dL (ref 1.7–2.4)

## 2021-02-26 MED ORDER — MAGNESIUM SULFATE IN D5W 1-5 GM/100ML-% IV SOLN
1.0000 g | Freq: Once | INTRAVENOUS | Status: AC
  Administered 2021-02-26: 1 g via INTRAVENOUS
  Filled 2021-02-26: qty 100

## 2021-02-26 MED ORDER — ONDANSETRON HCL 4 MG PO TABS
4.0000 mg | ORAL_TABLET | Freq: Four times a day (QID) | ORAL | Status: DC | PRN
Start: 2021-02-26 — End: 2021-03-06

## 2021-02-26 MED ORDER — SODIUM CHLORIDE 0.9% FLUSH
3.0000 mL | Freq: Two times a day (BID) | INTRAVENOUS | Status: DC
  Administered 2021-02-26 – 2021-03-06 (×14): 3 mL via INTRAVENOUS

## 2021-02-26 MED ORDER — ENOXAPARIN SODIUM 40 MG/0.4ML IJ SOSY
40.0000 mg | PREFILLED_SYRINGE | INTRAMUSCULAR | Status: DC
  Administered 2021-02-27 – 2021-03-06 (×8): 40 mg via SUBCUTANEOUS
  Filled 2021-02-26 (×8): qty 0.4

## 2021-02-26 MED ORDER — POTASSIUM CHLORIDE 10 MEQ/100ML IV SOLN
10.0000 meq | INTRAVENOUS | Status: AC
  Administered 2021-02-27 (×4): 10 meq via INTRAVENOUS
  Filled 2021-02-26 (×4): qty 100

## 2021-02-26 MED ORDER — BUPROPION HCL ER (SR) 150 MG PO TB12
150.0000 mg | ORAL_TABLET | Freq: Two times a day (BID) | ORAL | Status: DC
  Administered 2021-02-27 – 2021-03-06 (×15): 150 mg via ORAL
  Filled 2021-02-26 (×15): qty 1

## 2021-02-26 MED ORDER — POTASSIUM CHLORIDE CRYS ER 20 MEQ PO TBCR
20.0000 meq | EXTENDED_RELEASE_TABLET | Freq: Every day | ORAL | Status: DC
  Administered 2021-02-27 – 2021-03-06 (×8): 20 meq via ORAL
  Filled 2021-02-26 (×8): qty 1

## 2021-02-26 MED ORDER — CARVEDILOL 3.125 MG PO TABS
3.1250 mg | ORAL_TABLET | Freq: Two times a day (BID) | ORAL | Status: DC
  Administered 2021-02-27 – 2021-03-06 (×15): 3.125 mg via ORAL
  Filled 2021-02-26 (×15): qty 1

## 2021-02-26 MED ORDER — POTASSIUM CHLORIDE 10 MEQ/100ML IV SOLN
10.0000 meq | Freq: Once | INTRAVENOUS | Status: AC
  Administered 2021-02-26: 10 meq via INTRAVENOUS
  Filled 2021-02-26: qty 100

## 2021-02-26 MED ORDER — SIMVASTATIN 40 MG PO TABS
40.0000 mg | ORAL_TABLET | Freq: Every day | ORAL | Status: DC
  Administered 2021-02-27 – 2021-03-05 (×7): 40 mg via ORAL
  Filled 2021-02-26 (×7): qty 1

## 2021-02-26 MED ORDER — ONDANSETRON HCL 4 MG/2ML IJ SOLN: 4.0000 mg | Freq: Four times a day (QID) | INTRAMUSCULAR | Status: DC | PRN

## 2021-02-26 MED ORDER — ASPIRIN 81 MG PO CHEW
81.0000 mg | CHEWABLE_TABLET | Freq: Every day | ORAL | Status: DC
  Administered 2021-02-27 – 2021-03-06 (×8): 81 mg via ORAL
  Filled 2021-02-26 (×8): qty 1

## 2021-02-26 MED ORDER — ACETAMINOPHEN 325 MG PO TABS: 650.0000 mg | ORAL_TABLET | Freq: Four times a day (QID) | ORAL | Status: DC | PRN

## 2021-02-26 MED ORDER — POTASSIUM CHLORIDE CRYS ER 20 MEQ PO TBCR
40.0000 meq | EXTENDED_RELEASE_TABLET | Freq: Once | ORAL | Status: AC
  Administered 2021-02-26: 40 meq via ORAL
  Filled 2021-02-26: qty 2

## 2021-02-26 MED ORDER — ACETAMINOPHEN 650 MG RE SUPP: 650.0000 mg | Freq: Four times a day (QID) | RECTAL | Status: DC | PRN

## 2021-02-26 MED ORDER — SENNOSIDES-DOCUSATE SODIUM 8.6-50 MG PO TABS
1.0000 | ORAL_TABLET | Freq: Every evening | ORAL | Status: DC | PRN
  Administered 2021-03-04: 1 via ORAL
  Filled 2021-02-26: qty 1

## 2021-02-26 MED ORDER — LEVOTHYROXINE SODIUM 112 MCG PO TABS
112.0000 ug | ORAL_TABLET | Freq: Every day | ORAL | Status: DC
  Administered 2021-02-27 – 2021-03-06 (×8): 112 ug via ORAL
  Filled 2021-02-26 (×9): qty 1

## 2021-02-26 MED ORDER — INSULIN ASPART 100 UNIT/ML IJ SOLN
0.0000 [IU] | Freq: Three times a day (TID) | INTRAMUSCULAR | Status: DC
  Administered 2021-03-01 – 2021-03-03 (×2): 2 [IU] via SUBCUTANEOUS
  Administered 2021-03-04 – 2021-03-06 (×2): 1 [IU] via SUBCUTANEOUS
  Filled 2021-02-26: qty 0.09

## 2021-02-26 NOTE — ED Provider Notes (Signed)
[facility]-EMERGENCY DEPT Provider Note   CSN: [id number] Arrival date & time: 02/26/21  1821     History Chief Complaint  Patient presents with   Weakness    Sue [name] is a 87 y.o. female.  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  Patient was just discharged 2 days ago after her admission for stroke.  She was also found to have UTI with retention.  She currently has an indwelling Foley and is completing a course of Macrobid.  Patient states today when she was trying to get off the toilet and back into bed she was so generally weak that she could not do it.  Her son is home with her and was unable to help her as well.  He was concerned with how weak she was to be called an ambulance.  Patient states that she has been eating and drinking well, compliant with her medications.  Denies any new acute symptoms including headache, vision change, new facial droop, new/worsening weakness or numbness.  Speech is baseline.  Past Medical History:  Diagnosis Date   CHF (congestive heart failure) (HCC)    Diabetes mellitus without complication (HCC)    Hyperlipidemia    Thyroid disease     Patient Active Problem List   Diagnosis Date Noted   UTI (urinary tract infection) 02/23/2021   Urinary retention 02/23/2021   Embolic stroke (HCC) 02/01/2021   Acute ischemic stroke (HCC)    Leukocytosis    Dyslipidemia    Diabetes mellitus type 2 in obese (HCC)    Congestive heart failure (HCC)    Stroke (cerebrum) (HCC) 01/25/2021   Acute on chronic diastolic CHF (congestive heart failure) (HCC) 01/10/2021   Acute respiratory failure with hypoxemia (HCC) 01/10/2021   Pneumonia 01/09/2021   Osteoarthritis of ankle and foot 01/09/2021   Chronic combined systolic and diastolic heart failure (HCC) 01/09/2021   Chronic fatigue syndrome  01/09/2021   Essential hypertension 01/09/2021   Hyperlipidemia 01/09/2021   Hypothyroidism 01/09/2021   Diabetes (HCC) 04/14/2020    Past Surgical History:  Procedure Laterality Date   LOOP RECORDER INSERTION N/A 01/28/2021   Procedure: LOOP RECORDER INSERTION;  Surgeon: Regan Lemming, MD;  Location: [facility] INVASIVE CV LAB;  Service: Cardiovascular;  Laterality: N/A;     OB History   No obstetric history on file.     Family History  Problem Relation Age of Onset   Diabetes Mother     Social History   Tobacco Use   Smoking status: Former    Pack years: 0.00    Types: Cigarettes    Quit date: 09/06/1958    Years since quitting: 62.5   Smokeless tobacco: Never  Vaping Use   Vaping Use: Never used  Substance Use Topics   Alcohol use: No   Drug use: No    Home Medications Prior to Admission medications   Medication Sig Start Date End Date Taking? Authorizing Provider  acetaminophen (TYLENOL) 325 MG tablet Take 1-2 tablets (325-650 mg total) by mouth every 4 (four) hours as needed for mild pain. 02/23/21   Love, Evlyn Kanner, PA-C  aspirin 81 MG chewable tablet Chew 1 tablet (81 mg total) by mouth daily. 02/23/21   Love, Evlyn Kanner, PA-C  benzocaine (ORAJEL) 10 % mucosal gel Use as directed in the mouth or throat 4 (four) times daily as needed  for mouth pain. 02/23/21   Love, Evlyn Kanner, PA-C  buPROPion (WELLBUTRIN SR) 150 MG 12 hr tablet Take 1 tablet (150 mg total) by mouth 2 (two) times daily. 02/23/21   Love, Evlyn Kanner, PA-C  carvedilol (COREG) 3.125 MG tablet Take 1 tablet (3.125 mg total) by mouth 2 (two) times daily with a meal. 02/23/21   Love, Evlyn Kanner, PA-C  diclofenac Sodium (VOLTAREN) 1 % GEL Apply 2 g topically 3 (three) times daily. 02/23/21   Love, Evlyn Kanner, PA-C  furosemide (LASIX) 40 MG tablet Take 1 tablet (40 mg total) by mouth 2 (two) times daily. 02/23/21   Love, Evlyn Kanner, PA-C  levothyroxine (SYNTHROID) 112 MCG tablet Take 1 tablet (112 mcg total) by mouth daily  before breakfast. 02/23/21   Love, Evlyn Kanner, PA-C  Mouthwashes (MOUTH RINSE) LIQD solution 15 mLs by Mouth Rinse route 2 (two) times daily. 02/01/21   Bailey-Modzik, Delila A, NP  nitrofurantoin, macrocrystal-monohydrate, (MACROBID) 100 MG capsule Take 1 capsule (100 mg total) by mouth every 12 (twelve) hours. 02/23/21   Love, Evlyn Kanner, PA-C  pantoprazole (PROTONIX) 40 MG tablet Take 1 tablet (40 mg total) by mouth at bedtime. 02/23/21   Love, Evlyn Kanner, PA-C  polyethylene glycol (MIRALAX / GLYCOLAX) 17 g packet Take 17 g by mouth daily. 02/23/21   Love, Evlyn Kanner, PA-C  potassium chloride SA (KLOR-CON) 20 MEQ tablet Take 1 tablet (20 mEq total) by mouth daily. 02/23/21   Love, Evlyn Kanner, PA-C  QUEtiapine (SEROQUEL) 25 MG tablet Take 1 tablet (25 mg total) by mouth at bedtime. 02/23/21   Love, Evlyn Kanner, PA-C  senna-docusate (SENOKOT-S) 8.6-50 MG tablet Take 1 tablet by mouth 2 (two) times daily. 02/23/21   Love, Evlyn Kanner, PA-C  simvastatin (ZOCOR) 40 MG tablet Take 1 tablet (40 mg total) by mouth daily at 6 PM. 02/23/21   Love, Evlyn Kanner, PA-C  tamsulosin (FLOMAX) 0.4 MG CAPS capsule Take 1 capsule (0.4 mg total) by mouth daily after supper. 02/23/21   Love, Evlyn Kanner, PA-C  tobramycin-dexamethasone [facility]) ophthalmic solution Place 1 drop into the left eye every 6 (six) hours. For 2 more days 02/23/21   Jacquelynn Cree, PA-C    Allergies    Patient has no known allergies.  Review of Systems   Review of Systems  Constitutional:  Positive for fatigue. Negative for chills and fever.  HENT:  Negative for congestion.   Eyes:  Negative for visual disturbance.  Respiratory:  Negative for shortness of breath.   Cardiovascular:  Negative for chest pain.  Gastrointestinal:  Negative for abdominal pain, diarrhea and vomiting.  Genitourinary:  Negative for dysuria.  Skin:  Negative for rash.  Neurological:  Negative for dizziness, facial asymmetry, speech difficulty, weakness, numbness and headaches.   Physical  Exam Updated Vital Signs BP 133/89 (BP Location: Left Arm)   Pulse 86   Temp 98.1 F (36.7 C) (Oral)   Resp 18   Ht 5\' 4"  (1.626 m)   Wt 83 kg   SpO2 92%   BMI 31.41 kg/m   Physical Exam Vitals and nursing note reviewed.  Constitutional:      General: She is not in acute distress.    Appearance: Normal appearance.  HENT:     Head: Normocephalic.     Mouth/Throat:     Mouth: Mucous membranes are moist.  Eyes:     Comments: Left coloboma  Cardiovascular:     Rate and Rhythm: Normal rate.  Pulmonary:  Effort: Pulmonary effort is normal. No respiratory distress.  Abdominal:     Palpations: Abdomen is soft.     Tenderness: There is no abdominal tenderness.  Skin:    General: Skin is warm.  Neurological:     Mental Status: She is alert and oriented to person, place, and time. Mental status is at baseline.     Comments: Slight weakness in the right upper extremity when compared to the left, face is symmetrical, speech is soft but does not appear dysarthric or aphasic, vision appears baseline, hard of hearing  Psychiatric:        Mood and Affect: Mood normal.    ED Results / Procedures / Treatments   Labs (all labs ordered are listed, but only abnormal results are displayed) Labs Reviewed  CBC WITH DIFFERENTIAL/PLATELET  BASIC METABOLIC PANEL  URINALYSIS, ROUTINE W REFLEX MICROSCOPIC    EKG None  Radiology No results found.  Procedures Procedures   Medications Ordered in ED Medications - No data to display  ED Course  I have reviewed the triage vital signs and the nursing notes.  Pertinent labs & imaging results that were available during my care of the patient were reviewed by me and considered in my medical decision making (see chart for details).    MDM Rules/Calculators/A&P                          87 year old female presents the emergency department with concern for generalized weakness.  Patient was so weak she was unable to stand even with  assistance of her son earlier today.  States that she has been compliant with her antibiotic and other medications.  She has an indwelling Foley, urinalysis still appears infected, blood work otherwise shows a new acute hypokalemia down to 2.5, baseline was 3.6 two days ago, this and ongoing UTI is most likely the source of her weakness. EKG unchanged for the patient, Mag normal. Will replace potassium and admit for further evaluation and treatment.  Patients evaluation and results requires admission for further treatment and care. Patient agrees with admission plan, offers no new complaints and is stable/unchanged at time of admit.  Final Clinical Impression(s) / ED Diagnoses Final diagnoses:  None    Rx / DC Orders ED Discharge Orders     None        Rozelle Logan, DO 02/26/21 2220

## 2021-02-26 NOTE — ED Notes (Signed)
Nurse and NT unkinked pt catheter that was stuck underneath pt but intact with urine return.

## 2021-02-26 NOTE — ED Triage Notes (Addendum)
Pt presents to the ED via EMS for fatigue. Pt lives with her son and per EMS, pt's son is "worried about her." Pt's son was concerned the pt was having a stroke given she was unable to "get off the toilet by herself." The pt had a stroke one month ago. Pt additionally fell two days ago, uncertain the cause, and was evaluated at the ED at that time with a negative work-up and d/c'd. EMS states pt has a hearing deficit on the left secondary to recent stroke. Pt's stroke scale is unremarkable here in the ED. Pt has an indwelling foley cath, uncertain when this was placed.

## 2021-02-26 NOTE — ED Notes (Signed)
Provider at the bedside.  

## 2021-02-26 NOTE — Progress Notes (Signed)
Patient ID: Sue [name], female   DOB: [birth year], 87 y.o.   MRN: [id number]  6/23  SW received call from [facility], [employer] research associate) has made 2x attempts to get custodial care services stared with patient.   Son reports he has to check with his mother. [place] waiting on follow up from son.  [place], [place] [phone]

## 2021-02-26 NOTE — H&P (Signed)
History and Physical    Sue [name] ZOX:[id number] DOB: 09/15/1929 DOA: 02/26/2021  PCP: Sigmund HazelMiller, Lisa, MD  Patient coming from: Home via EMS  I have personally briefly [facility]  Chief Complaint: Fatigue/weakness  HPI: Sue [name] is a 87 y.o. female with medical history significant for [name] diastolic CHF, T2DM, HTN, HLD, hypothyroidism, GERD, depression, hard of hearing, and recent admission for acute CVA who presented to the ED for evaluation of generalized weakness.  Patient was recently admitted from 01/25/2021-02/01/2021 after developing sudden onset right-sided weakness.  She was found to have suspected cardioembolic acute and subacute infarcts of the bilateral basal ganglia and corona radiata as well as right occipitoparietal lobes.  She received tPA.  She was started on DAPT with aspirin/Plavix for 3 weeks, now on aspirin alone.  ILR was placed 5/25.  Patient was discharged to CIR where she remained until discharge to home on 02/23/21.  While in CIR she developed urinary retention and was found to have Citrobacter freundii UTI and was treated with a 5-day course of Macrobid.  She was started on Urecholine and Flomax with improvement in retention.  Urinary retention recurred due to E. coli UTI on 6/15.  She was given another course of Macrobid and Foley catheter was placed with advice to follow-up with urology as an outpatient.  She was started on low-dose Seroquel while in CIR due to sleep-wake disruption issues.  Patient states she was doing fine at home until earlier today when she developed weakness in her bilateral upper thighs.  She says she was unable to ambulate with the use of her walker.  She did not fall or lose consciousness.  She has not had any chest pain, dyspnea, nausea, vomiting, abdominal pain, diarrhea.  She denies any dysuria while Foley catheter is in place.  She has been taking Lasix with good urine output.  ED  Course:  Initial vitals showed BP 133/89, pulse 86, RR 18, temp 90.1 F, SPO2 92% on room air.  Labs show potassium 2.5, magnesium 1.8, sodium 141, bicarb 25, BUN 12, creatinine 0.83, serum glucose 114, WBC 11.6, hemoglobin 16.7, platelets 528,000.  Urinalysis shows negative nitrites, large leukocytes, 6-10 RBC/hpf, >50 WBC/hpf, rare bacteria on microscopy.  Patient was given IV K 10 mEq and oral K 40 mill equivalents.  The hospitalist service was consulted to admit for further evaluation and management.  Review of Systems: All systems reviewed and are negative except as documented in history of present illness above.   Past Medical History:  Diagnosis Date   CHF (congestive heart failure) (HCC)    Diabetes mellitus without complication (HCC)    Hyperlipidemia    Thyroid disease     Past Surgical History:  Procedure Laterality Date   LOOP RECORDER INSERTION N/A 01/28/2021   Procedure: LOOP RECORDER INSERTION;  Surgeon: Regan Lemmingamnitz, Will Martin, MD;  Location: [facility] INVASIVE CV LAB;  Service: Cardiovascular;  Laterality: N/A;    Social History:  reports that she quit smoking about 62 years ago. Her smoking use included cigarettes. She has never used smokeless tobacco. She reports that she does not drink alcohol and does not use drugs.  No Known Allergies  Family History  Problem Relation Age of Onset   Diabetes Mother      Prior to Admission medications   Medication Sig Start Date End Date Taking? Authorizing Provider  acetaminophen (TYLENOL) 325 MG tablet Take 1-2 tablets (325-650 mg total) by mouth every  4 (four) hours as needed for mild pain. 02/23/21   Love, Evlyn Kanner, PA-C  aspirin 81 MG chewable tablet Chew 1 tablet (81 mg total) by mouth daily. 02/23/21   Love, Evlyn Kanner, PA-C  benzocaine (ORAJEL) 10 % mucosal gel Use as directed in the mouth or throat 4 (four) times daily as needed for mouth pain. 02/23/21   Love, Evlyn Kanner, PA-C  buPROPion (WELLBUTRIN SR) 150 MG 12 hr tablet Take 1  tablet (150 mg total) by mouth 2 (two) times daily. 02/23/21   Love, Evlyn Kanner, PA-C  carvedilol (COREG) 3.125 MG tablet Take 1 tablet (3.125 mg total) by mouth 2 (two) times daily with a meal. 02/23/21   Love, Evlyn Kanner, PA-C  diclofenac Sodium (VOLTAREN) 1 % GEL Apply 2 g topically 3 (three) times daily. 02/23/21   Love, Evlyn Kanner, PA-C  furosemide (LASIX) 40 MG tablet Take 1 tablet (40 mg total) by mouth 2 (two) times daily. 02/23/21   Love, Evlyn Kanner, PA-C  levothyroxine (SYNTHROID) 112 MCG tablet Take 1 tablet (112 mcg total) by mouth daily before breakfast. 02/23/21   Love, Evlyn Kanner, PA-C  Mouthwashes (MOUTH RINSE) LIQD solution 15 mLs by Mouth Rinse route 2 (two) times daily. 02/01/21   Bailey-Modzik, Delila A, NP  nitrofurantoin, macrocrystal-monohydrate, (MACROBID) 100 MG capsule Take 1 capsule (100 mg total) by mouth every 12 (twelve) hours. 02/23/21   Love, Evlyn Kanner, PA-C  pantoprazole (PROTONIX) 40 MG tablet Take 1 tablet (40 mg total) by mouth at bedtime. 02/23/21   Love, Evlyn Kanner, PA-C  polyethylene glycol (MIRALAX / GLYCOLAX) 17 g packet Take 17 g by mouth daily. 02/23/21   Love, Evlyn Kanner, PA-C  potassium chloride SA (KLOR-CON) 20 MEQ tablet Take 1 tablet (20 mEq total) by mouth daily. 02/23/21   Love, Evlyn Kanner, PA-C  QUEtiapine (SEROQUEL) 25 MG tablet Take 1 tablet (25 mg total) by mouth at bedtime. 02/23/21   Love, Evlyn Kanner, PA-C  senna-docusate (SENOKOT-S) 8.6-50 MG tablet Take 1 tablet by mouth 2 (two) times daily. 02/23/21   Love, Evlyn Kanner, PA-C  simvastatin (ZOCOR) 40 MG tablet Take 1 tablet (40 mg total) by mouth daily at 6 PM. 02/23/21   Love, Evlyn Kanner, PA-C  tamsulosin (FLOMAX) 0.4 MG CAPS capsule Take 1 capsule (0.4 mg total) by mouth daily after supper. 02/23/21   Love, Evlyn Kanner, PA-C  tobramycin-dexamethasone [facility]) ophthalmic solution Place 1 drop into the left eye every 6 (six) hours. For 2 more days 02/23/21   Jerene Pitch    Physical Exam: Vitals:   02/26/21 2030 02/26/21  2100 02/26/21 2130 02/26/21 2145  BP: (!) 127/92 130/89 135/81   Pulse: 82 83 (!) 43 86  Resp: (!) 23 16 17    Temp:      TempSrc:      SpO2: 95% 92% 91%   Weight:      Height:       Constitutional: Elderly woman resting supine in bed, NAD, calm, comfortable Eyes: Reactive right pupil, left pupil irregularly shaped, lids and conjunctivae normal ENMT: Mucous membranes are dry. Posterior pharynx clear of any exudate or lesions.poor dentition.  Hard of hearing. Neck: normal, supple, no masses. Respiratory: clear to auscultation bilaterally, no wheezing, no crackles. Normal respiratory effort. No accessory muscle use.  Cardiovascular: Regular rate and rhythm, no murmurs / rubs / gallops.  Trace bilateral lower extremity edema. 2+ pedal pulses. Abdomen: no tenderness, no masses palpated. No hepatosplenomegaly. GU: Foley catheter in  place with yellow urine in collecting bag Musculoskeletal: no clubbing / cyanosis. No joint deformity upper and lower extremities. Good ROM, no contractures. Normal muscle tone.  Skin: no rashes, lesions, ulcers. No induration Neurologic: CN 2-12 grossly intact. Sensation intact. Strength 5/5 in all 4.  Psychiatric: Alert and oriented x 3.  Exhibits some repetitive/circumferential speech.  Labs on Admission: I have personally reviewed following labs and imaging studies  CBC: Recent Labs  Lab 02/20/21 0514 02/23/21 0759 02/26/21 1933  WBC 9.8 8.5 11.6*  NEUTROABS 6.5  --  8.1*  HGB 17.4* 16.2* 16.7*  HCT 52.3* 49.8* 50.1*  MCV 96.3 96.7 96.7  PLT 508* 548* 528*   Basic Metabolic Panel: Recent Labs  Lab 02/20/21 0514 02/23/21 0759 02/26/21 1933  NA 137 134* 141  K 3.6 3.6 2.5*  CL 97* 96* 108  CO2 27 26 25   GLUCOSE 117* 156* 114*  BUN 13 14 12   CREATININE 1.02* 1.12* 0.83  CALCIUM 9.7 9.6 7.9*  MG  --   --  1.8   GFR: Estimated Creatinine Clearance: 47.9 mL/min (by C-G formula based on SCr of 0.83 mg/dL). Liver Function Tests: No results  for input(s): AST, ALT, ALKPHOS, BILITOT, PROT, ALBUMIN in the last 168 hours. No results for input(s): LIPASE, AMYLASE in the last 168 hours. No results for input(s): AMMONIA in the last 168 hours. Coagulation Profile: No results for input(s): INR, PROTIME in the last 168 hours. Cardiac Enzymes: No results for input(s): CKTOTAL, CKMB, CKMBINDEX, TROPONINI in the last 168 hours. BNP (last 3 results) No results for input(s): PROBNP in the last 8760 hours. HbA1C: No results for input(s): HGBA1C in the last 72 hours. CBG: No results for input(s): GLUCAP in the last 168 hours. Lipid Profile: No results for input(s): CHOL, HDL, LDLCALC, TRIG, CHOLHDL, LDLDIRECT in the last 72 hours. Thyroid Function Tests: No results for input(s): TSH, T4TOTAL, FREET4, T3FREE, THYROIDAB in the last 72 hours. Anemia Panel: No results for input(s): VITAMINB12, FOLATE, FERRITIN, TIBC, IRON, RETICCTPCT in the last 72 hours. Urine analysis:    Component Value Date/Time   COLORURINE YELLOW 02/26/2021 1933   APPEARANCEUR HAZY (A) 02/26/2021 1933   LABSPEC 1.009 02/26/2021 1933   PHURINE 6.0 02/26/2021 1933   GLUCOSEU NEGATIVE 02/26/2021 1933   HGBUR SMALL (A) 02/26/2021 1933   BILIRUBINUR NEGATIVE 02/26/2021 1933   KETONESUR NEGATIVE 02/26/2021 1933   PROTEINUR NEGATIVE 02/26/2021 1933   NITRITE NEGATIVE 02/26/2021 1933   LEUKOCYTESUR LARGE (A) 02/26/2021 1933    Radiological Exams on Admission: No results found.  EKG: Personally reviewed.  Sinus rhythm, RBBB. Similar to prior.  Assessment/Plan Principal Problem:   Hypokalemia Active Problems:   [name] diastolic CHF (congestive heart failure) (HCC)   Hypertension associated with diabetes (HCC)   Hyperlipidemia associated with type 2 diabetes mellitus (HCC)   Hypothyroidism   Diabetes mellitus type 2 in obese [facility])   Urinary retention   Generalized weakness   Sue [name] is a 87 y.o. female with medical history significant for [name]  diastolic CHF, T2DM, HTN, HLD, hypothyroidism, GERD, depression, hard of hearing, and recent admission for acute CVA who is admitted with hypokalemia and generalized weakness.  Hypokalemia: Likely related to Lasix.  Denies any GI losses.  Initial supplement given in the ED.  Magnesium 1.8. -Supplement magnesium followed by additional IV potassium -Hold Lasix -Repeat labs in a.m.  Generalized weakness with falls at home: In setting of recent prolonged admission after acute CVA and hypokalemia.  As  reported PT/OT/SLP set up for home but has not begun yet. -Request PT/OT eval while in hospital  Recent admission for suspected cardioembolic CVA: MRI brain 5/23 showed small acute/subacute infarcts involving b/l basal ganglia and corona radiata, small-moderate infarct with petechial hemorrhage right occipitoparietal lobes. -Continue aspirin and statin -Follow-up with neurology outpatient  Urinary retention with Foley catheter in place: Developed urinary retention while in inpatient rehab.  Foley catheter remains in place.  Recently treated for Citrobacter and E. coli UTI with Macrobid.  Urinalysis on admission likely colonized without acute infection. -Continue Foley care -Discontinue sertraline  [name] diastolic CHF: Stable without acute decompensation.  Holding Lasix as above.  Monitor strict I/O's.  Type 2 diabetes: A1c 6.9%.  Not on medical management as an outpatient.  Place on SSI.  Hypertension: Continue Coreg.  Hypothyroidism: Continue Synthroid.  Hyperlipidemia: Continue simvastatin.  Depression: Continue bupropion.   DVT prophylaxis: Lovenox Code Status: Full code Family Communication: Discussed with patient, she has discussed with family Disposition Plan: From home, dispo pending clinical progress Consults called: None Level of care: Telemetry Admission status:  Status is: Observation  The patient remains OBS appropriate and will d/c before 2  midnights.  Dispo: The patient is from: Home              Anticipated d/c is to:  Home versus SNF              Patient currently is not medically stable to d/c.   Darreld Mclean MD [facility]  If 7PM-7AM, please contact night-coverage [email or web address]  02/26/2021, 10:24 PM

## 2021-02-27 ENCOUNTER — Encounter (HOSPITAL_COMMUNITY): Payer: Self-pay | Admitting: Internal Medicine

## 2021-02-27 ENCOUNTER — Other Ambulatory Visit: Payer: Self-pay

## 2021-02-27 DIAGNOSIS — E876 Hypokalemia: Secondary | ICD-10-CM | POA: Diagnosis not present

## 2021-02-27 LAB — COMPREHENSIVE METABOLIC PANEL
ALT: 21 U/L (ref 0–44)
AST: 24 U/L (ref 15–41)
Albumin: 3.5 g/dL (ref 3.5–5.0)
Alkaline Phosphatase: 74 U/L (ref 38–126)
Anion gap: 11 (ref 5–15)
BUN: 14 mg/dL (ref 8–23)
CO2: 23 mmol/L (ref 22–32)
Calcium: 8.9 mg/dL (ref 8.9–10.3)
Chloride: 104 mmol/L (ref 98–111)
Creatinine, Ser: 0.82 mg/dL (ref 0.44–1.00)
GFR, Estimated: >60 mL/min (ref >60)
Glucose, Bld: 103 mg/dL — ABNORMAL HIGH (ref 70–99)
Potassium: 3.6 mmol/L (ref 3.5–5.1)
Sodium: 138 mmol/L (ref 135–145)
Total Bilirubin: 0.6 mg/dL (ref 0.3–1.2)
Total Protein: 6.6 g/dL (ref 6.5–8.1)

## 2021-02-27 LAB — SARS CORONAVIRUS 2 (TAT 6-24 HRS): SARS Coronavirus 2: NEGATIVE

## 2021-02-27 LAB — GLUCOSE, CAPILLARY
Glucose-Capillary: 102 mg/dL — ABNORMAL HIGH (ref 70–99)
Glucose-Capillary: 133 mg/dL — ABNORMAL HIGH (ref 70–99)
Glucose-Capillary: 90 mg/dL (ref 70–99)
Glucose-Capillary: 130 mg/dL — ABNORMAL HIGH (ref 70–99)

## 2021-02-27 LAB — CBC
HCT: 52.7 % — ABNORMAL HIGH (ref 36.0–46.0)
Hemoglobin: 16.8 g/dL — ABNORMAL HIGH (ref 12.0–15.0)
MCH: 31.6 pg (ref 26.0–34.0)
MCHC: 31.9 g/dL (ref 30.0–36.0)
MCV: 99.1 fL (ref 80.0–100.0)
Platelets: 472 10*3/uL — ABNORMAL HIGH (ref 150–400)
RBC: 5.32 MIL/uL — ABNORMAL HIGH (ref 3.87–5.11)
RDW: 15.3 % (ref 11.5–15.5)
WBC: 11.3 10*3/uL — ABNORMAL HIGH (ref 4.0–10.5)
nRBC: 0 % (ref 0.0–0.2)

## 2021-02-27 LAB — MAGNESIUM: Magnesium: 2 mg/dL (ref 1.7–2.4)

## 2021-02-27 MED ORDER — CHLORHEXIDINE GLUCONATE CLOTH 2 % EX PADS
6.0000 | MEDICATED_PAD | Freq: Every day | CUTANEOUS | Status: DC
  Administered 2021-02-27: 6 via TOPICAL

## 2021-02-27 MED ORDER — POTASSIUM CHLORIDE 20 MEQ PO PACK
40.0000 meq | PACK | Freq: Once | ORAL | Status: AC
  Administered 2021-02-27: 40 meq via ORAL
  Filled 2021-02-27: qty 2

## 2021-02-27 NOTE — Progress Notes (Signed)
PROGRESS NOTE    Sue [name]  IPJ:[id number] DOB: August 31, 1930 DOA: 02/26/2021 PCP: Sigmund Hazel, MD    Brief Narrative:  This 87 years old female with PMH significant for chronic diastolic CHF, type 2 diabetes, hypertension, hyperlipidemia, hypothyroidism, GERD, depression, hard of hearing and recent admission for acute CVA presented to the ED with generalized weakness.  Patient was recently admitted from 01/25/2021-02/01/2021 after developing sudden onset right-sided weakness.  She was found to have suspected cardioembolic acute and subacute infarcts of the bilateral basal ganglia and corona radiata as well as right occipitoparietal lobes.  She received tPA.  She was started on DAPT with aspirin/Plavix for 3 weeks, now on aspirin alone.Patient was discharged to CIR where she remained until discharge to home on 02/23/21.  She has developed acute urinary retention requiring Foley catheter and UTI growing E. coli and Citrobacter she was treated with Macrobid twice in rehab.  Patient was admitted for generalized weakness, She is found to have hypokalemia.   Assessment & Plan:   Principal Problem:   Hypokalemia Active Problems:   Chronic diastolic CHF (congestive heart failure) (HCC)   Hypertension associated with diabetes (HCC)   Hyperlipidemia associated with type 2 diabetes mellitus (HCC)   Hypothyroidism   Diabetes mellitus type 2 in obese (HCC)   Urinary retention   Generalized weakness   Hypokalemia: > Improved Patient presented with generalized weakness found to have potassium 2.3, Likely sec. to Lasix.  Denies any GI losses.   Replacement completed, potassium has improved. Will continue to hold Lasix Recheck labs in the morning.   Generalized weakness with falls at home: In setting of recent prolonged admission after acute CVA and hypokalemia.   As reported PT/OT/SLP set up for home but has not begun yet. PT/ OT evaluation.   Suspected cardioembolic CVA: MRI brain 5/23  showed small acute/subacute infarcts involving b/l basal ganglia and corona radiata, small-moderate infarct with petechial hemorrhage right occipitoparietal lobes. Continue aspirin and statin Follow-up with neurology outpatient.   Urinary retention with Foley catheter in place: She has developed urinary retention while in inpatient rehab.   Foley catheter remains in place.  Recently treated for Citrobacter and E. coli UTI with Macrobid.   Urinalysis on admission likely colonized without acute infection. Continue Foley care Discontinue sertraline   Chronic diastolic CHF: Stable without acute decompensation.   Holding Lasix as above.  Monitor strict I/O's.   Type 2 diabetes: A1c 6.9%.  Not on medical management as an outpatient.   Regular insulin sliding scale.  Hypertension: Continue Coreg.   Hypothyroidism: Continue Synthroid.   Hyperlipidemia: Continue simvastatin.   Depression: Continue bupropion.   DVT prophylaxis: Lovenox Code Status: Full code. Family Communication: No family at bed side. Disposition Plan:     Status is: Observation  The patient remains OBS appropriate and will d/c before 2 midnights.  Dispo: The patient is from: Home              Anticipated d/c is to: SNF              Patient currently is not medically stable to d/c.   Difficult to place patient No  Consultants:  None  Procedures: None  Antimicrobials:   Anti-infectives (From admission, onward)    None      Subjective: Patient was seen and examined at bedside.  Overnight events noted.  Patient is very hard of hearing.  Patient reports feeling improved after getting IV fluids.  Her potassium has improved.  Objective: Vitals:   02/26/21 2145 02/27/21 0100 02/27/21 0512 02/27/21 1222  BP:  132/70 129/71 109/66  Pulse: 86 82 79 72  Resp:  20  18  Temp:  98.6 F (37 C) 97.6 F (36.4 C) 98.1 F (36.7 C)  TempSrc:  Oral Oral Oral  SpO2:  92% 92% 93%  Weight:      Height:   5\' 4"  (1.626 m)      Intake/Output Summary (Last 24 hours) at 02/27/2021 1403 Last data filed at 02/27/2021 0900 Gross per 24 hour  Intake 0 ml  Output 450 ml  Net -450 ml   Filed Weights   02/26/21 1834  Weight: 83 kg    Examination:  General exam: Appears calm and comfortable, not in any acute distress. Respiratory system: Clear to auscultation. Respiratory effort normal. Cardiovascular system: S1 & S2 heard, RRR. No JVD, murmurs, rubs, gallops or clicks. No pedal edema. Gastrointestinal system: Abdomen is nondistended, soft and nontender. No organomegaly or masses felt. Normal bowel sounds heard. Central nervous system: Alert and oriented. No focal neurological deficits. Extremities: Symmetric 5 x 5 power.  No edema, no cyanosis, no clubbing. Skin: No rashes, lesions or ulcers Psychiatry: Judgement and insight appear normal. Mood & affect appropriate.     Data Reviewed: I have personally reviewed following labs and imaging studies  CBC: Recent Labs  Lab 02/23/21 0759 02/26/21 1933 02/27/21 0340  WBC 8.5 11.6* 11.3*  NEUTROABS  --  8.1*  --   HGB 16.2* 16.7* 16.8*  HCT 49.8* 50.1* 52.7*  MCV 96.7 96.7 99.1  PLT 548* 528* 472*   Basic Metabolic Panel: Recent Labs  Lab 02/23/21 0759 02/26/21 1933 02/27/21 0340  NA 134* 141 138  K 3.6 2.5* 3.6  CL 96* 108 104  CO2 26 25 23   GLUCOSE 156* 114* 103*  BUN 14 12 14   CREATININE 1.12* 0.83 0.82  CALCIUM 9.6 7.9* 8.9  MG  --  1.8 2.0   GFR: Estimated Creatinine Clearance: 48.5 mL/min (by C-G formula based on SCr of 0.82 mg/dL). Liver Function Tests: Recent Labs  Lab 02/27/21 0340  AST 24  ALT 21  ALKPHOS 74  BILITOT 0.6  PROT 6.6  ALBUMIN 3.5   No results for input(s): LIPASE, AMYLASE in the last 168 hours. No results for input(s): AMMONIA in the last 168 hours. Coagulation Profile: No results for input(s): INR, PROTIME in the last 168 hours. Cardiac Enzymes: No results for input(s): CKTOTAL, CKMB,  CKMBINDEX, TROPONINI in the last 168 hours. BNP (last 3 results) No results for input(s): PROBNP in the last 8760 hours. HbA1C: No results for input(s): HGBA1C in the last 72 hours. CBG: Recent Labs  Lab 02/27/21 0856 02/27/21 1215  GLUCAP 102* 133*   Lipid Profile: No results for input(s): CHOL, HDL, LDLCALC, TRIG, CHOLHDL, LDLDIRECT in the last 72 hours. Thyroid Function Tests: No results for input(s): TSH, T4TOTAL, FREET4, T3FREE, THYROIDAB in the last 72 hours. Anemia Panel: No results for input(s): VITAMINB12, FOLATE, FERRITIN, TIBC, IRON, RETICCTPCT in the last 72 hours. Sepsis Labs: No results for input(s): PROCALCITON, LATICACIDVEN in the last 168 hours.  Recent Results (from the past 240 hour(s))  SARS CORONAVIRUS 2 (TAT 6-24 HRS) Nasopharyngeal Nasopharyngeal Swab     Status: None   Collection Time: 02/26/21 11:17 PM   Specimen: Nasopharyngeal Swab  Result Value Ref Range Status   SARS Coronavirus 2 NEGATIVE NEGATIVE Final    Comment: (NOTE) SARS-CoV-2 target nucleic acids are NOT DETECTED.  The SARS-CoV-2 RNA is generally detectable in upper and lower respiratory specimens during the acute phase of infection. Negative results do not preclude SARS-CoV-2 infection, do not rule out co-infections with other pathogens, and should not be used as the sole basis for treatment or other patient management decisions. Negative results must be combined with clinical observations, patient history, and epidemiological information. The expected result is Negative.  Fact Sheet for Patients: HairSlick.no  Fact Sheet for Healthcare Providers: quierodirigir.com  This test is not yet approved or cleared by the [place] FDA and  has been authorized for detection and/or diagnosis of SARS-CoV-2 by FDA under an Emergency Use Authorization (EUA). This EUA will remain  in effect (meaning this test can be used) for the duration  of the COVID-19 declaration under Se ction 564(b)(1) of the Act, 21 U.S.C. section 360bbb-3(b)(1), unless the authorization is terminated or revoked sooner.  Performed at [facility], [address], [place], [place] [address]     Radiology Studies: No results found.  Scheduled Meds:  aspirin  81 mg Oral Daily   buPROPion  150 mg Oral BID   carvedilol  3.125 mg Oral BID WC   enoxaparin (LOVENOX) injection  40 mg Subcutaneous Q24H   insulin aspart  0-9 Units Subcutaneous TID WC   levothyroxine  112 mcg Oral Q0600   potassium chloride SA  20 mEq Oral Daily   simvastatin  40 mg Oral q1800   sodium chloride flush  3 mL Intravenous Q12H   Continuous Infusions:   LOS: 0 days    Time spent: 35 MINS    Aleighya Mcanelly, MD [facility]   If 7PM-7AM, please contact night-coverage

## 2021-02-27 NOTE — TOC Progression Note (Addendum)
Transition of Care [facility]) - Progression Note    Patient Details  Name: Sue [name] MRN: [id number] Date of Birth: 11/07/1929  Transition of Care [facility]) CM/SW Contact  Geni Bers, RN Phone Number: 02/27/2021, 12:20 PM  Clinical Narrative:    Pt from home with son. A call was made to pt's son Sue [name] with no answer. Also test son to ask him to call CM.  According to Medical Director for [employer] pt will nor qualify for SNF and will not be approved for SNF. Pt will need to go home with home Care. [facility] has been trying to reach pt's son to setup home care, however was unsuccessful.    Expected Discharge Plan: Home w Home Health Services Barriers to Discharge: No Barriers Identified  Expected Discharge Plan and Services Expected Discharge Plan: Home w Home Health Services       Living arrangements for the past 2 months: Single Family Home                                       Social Determinants of Health (SDOH) Interventions    Readmission Risk Interventions No flowsheet data found.

## 2021-02-27 NOTE — Evaluation (Signed)
Occupational Therapy Evaluation Patient Details Name: Sue [name] MRN: [id number] DOB: 04-10-1930 Today's Date: 02/27/2021    History of Present Illness patient is a 87 year old female who presented to the hosptial on 02/26/21 with weakness. Patient's medical history significant for chronic diastolic CHF, T2DM, HTN, HLD, hypothyroidism, GERD, depression, hard of hearing, and recent admission for acute CVA. patient was found to have hypokalemia and weakness.   Clinical Impression   Patient is a 87 year old female who was admitted with reasoning above.  Patient reported being independent in bathing, grooming and toileting tasks at walker level prior to hospitalization. Currently, patient was noted to require min A for supine to sit on edged of bed with min guard for scooting to edge of bed. Patient required min guard for standing at sink with BUE support for oral care tasks. Patient required mod A for transfers on and off commode with education on proper hand/foot placement. Currently, patient plans to transition home with [facility] services. Patient could benefit from skilled OT services to mitigate barriers for patient to transition home. Patient is a poor historian with conflicting information. Will need to follow up with family.     Follow Up Recommendations  Home health OT    Equipment Recommendations       Recommendations for Other Services       Precautions / Restrictions Precautions Precautions: Fall      Mobility Bed Mobility Overal bed mobility: Needs Assistance Bed Mobility: Supine to Sit     Supine to sit: Min assist     General bed mobility comments: patient was impulsive with movements and attempted to act without following multimodal cues.    Transfers Overall transfer level: Needs assistance Equipment used: Rolling walker (2 wheeled) Transfers: Sit to/from [employer] Sit to Stand: Mod assist Stand pivot transfers: Mod assist       General  transfer comment: patient required increased assistance for transition from sit to stand with rolling walker. patient noted to pul up on walker instead of push from seated surface. patient needed cues to complete transfers prior to transitioning into sitting.    Balance Overall balance assessment: Needs assistance Sitting-balance support: Single extremity supported Sitting balance-Leahy Scale: Good     Standing balance support: Bilateral upper extremity supported Standing balance-Leahy Scale: Poor Standing balance comment: patient had to lean onto sink/RW for support when standing.                           ADL either performed or assessed with clinical judgement   ADL Overall ADL's : Needs assistance/impaired Eating/Feeding: Set up   Grooming: Oral care;Wash/dry face;Min guard Grooming Details (indicate cue type and reason): patient was noted to lean on counter for support while standing. Upper Body Bathing: Minimal assistance;Sitting   Lower Body Bathing: Moderate assistance;Sitting/lateral leans   Upper Body Dressing : Set up;Sitting   Lower Body Dressing: Moderate assistance;Sitting/lateral leans   Toilet Transfer: Moderate assistance;Stand-pivot;Grab bars;Ambulation Toilet Transfer Details (indicate cue type and reason): with rolling walker. Toileting- Clothing Manipulation and Hygiene: Sitting/lateral lean;Minimal assistance       Functional mobility during ADLs: Minimal assistance General ADL Comments: with rolling walker     Vision Patient Visual Report: No change from baseline Vision Assessment?: No apparent visual deficits     Perception     Praxis      Pertinent Vitals/Pain Pain Assessment: No/denies pain     Hand Dominance Right  Extremity/Trunk Assessment             Communication Communication Communication: HOH   Cognition Arousal/Alertness: Awake/alert Behavior During Therapy: WFL for tasks assessed/performed                                        General Comments       Exercises     Shoulder Instructions      Home Living Family/patient expects to be discharged to:: Private residence Living Arrangements: Children Available Help at Discharge: Family;Available 24 hours/day (son works from home) Type of Home: Apartment Home Access: Level entry     Home Layout: One level     Bathroom Shower/Tub: Chief Strategy Officer: Standard Bathroom Accessibility: Yes How Accessible: Accessible via wheelchair Home Equipment: Walker - 2 wheels;Shower seat      Lives With: Son    Prior Functioning/Environment Level of Independence: Needs assistance  Gait / Transfers Assistance Needed: patient reported using walker in bathroom " stationary one" and "wheeled one" in house. ADL's / Homemaking Assistance Needed: patient reported using AE for LB dressing IE sock aid. patient reported needing assistance for LB dressing tasks but later would report being able to complete independently. patient reported being able to complete bathing at sink and toileting tasks MI with walker at home. patient is a questionable historian will need to follow up with family.            OT Problem List: Decreased strength;Decreased activity tolerance;Impaired balance (sitting and/or standing)      OT Treatment/Interventions: Self-care/ADL training;Balance training;DME and/or AE instruction;Patient/family education;Neuromuscular education;Therapeutic activities    OT Goals(Current goals can be found in the care plan section) Acute Rehab OT Goals Patient Stated Goal: wants to go back home OT Goal Formulation: With patient Time For Goal Achievement: 03/13/21 Potential to Achieve Goals: Fair  OT Frequency: Min 2X/week   Barriers to D/C:            Co-evaluation              AM-PAC OT "6 Clicks" Daily Activity     Outcome Measure Help from another person eating meals?: None Help from another  person taking care of personal grooming?: A Little Help from another person toileting, which includes using toliet, bedpan, or urinal?: A Lot Help from another person bathing (including washing, rinsing, drying)?: A Lot Help from another person to put on and taking off regular upper body clothing?: A Little Help from another person to put on and taking off regular lower body clothing?: A Lot 6 Click Score: 16   End of Session Equipment Utilized During Treatment: Gait belt;Rolling walker Nurse Communication: Other (comment) (ok to see per RN)  Activity Tolerance: Patient tolerated treatment well Patient left: in chair;with call bell/phone within reach;with chair alarm set  OT Visit Diagnosis: Muscle weakness (generalized) (M62.81)                Time: 9476-5465 OT Time Calculation (min): 28 min Charges:  OT General Charges $OT Visit: 1 Visit OT Evaluation $OT Eval Low Complexity: 1 Low OT Treatments $Self Care/Home Management : 8-22 mins  Sue [name] OTR/L, Sue [name] Acute Rehabilitation Department Office# [phone] Pager# [phone]   Sue [name] 02/27/2021, 2:34 PM

## 2021-02-27 NOTE — Plan of Care (Signed)
  Problem: Education: Goal: Knowledge of General Education information will improve Description: Including pain rating scale, medication(s)/side effects and non-pharmacologic comfort measures Outcome: Progressing   Problem: Clinical Measurements: Goal: Diagnostic test results will improve Outcome: Progressing   Problem: Elimination: Goal: Will not experience complications related to urinary retention Outcome: Progressing   Problem: Safety: Goal: Ability to remain free from injury will improve Outcome: Progressing   

## 2021-02-27 NOTE — Evaluation (Signed)
Physical Therapy Evaluation Patient Details Name: Sue [name] MRN: [id number] DOB: May 28, 1930 Today's Date: 02/27/2021   History of Present Illness  patient is a 87 year old female who presented to the hosptial on 02/26/21 with weakness. Patient's medical history significant for chronic diastolic CHF, T2DM, HTN, HLD, hypothyroidism, GERD, depression, hard of hearing, and recent admission for acute CVA. patient was found to have hypokalemia and weakness.  Clinical Impression  Pt admitted with above diagnosis.  Pt  amb short hallway distance, had some c/o back pain but reported improvement after mobility. Will continue to follow in acute setting. Recommend HHPT   Pt currently with functional limitations due to the deficits listed below (see PT Problem List). Pt will benefit from skilled PT to increase their independence and safety with mobility to allow discharge to the venue listed below.       Follow Up Recommendations Home health PT;Supervision/Assistance - 24 hour    Equipment Recommendations  None recommended by PT    Recommendations for Other Services       Precautions / Restrictions Precautions Precautions: Fall Restrictions Weight Bearing Restrictions: No      Mobility  Bed Mobility Overal bed mobility: Needs Assistance Bed Mobility: Sit to Supine     Supine to sit: Min assist Sit to supine: Min assist;Min guard   General bed mobility comments: assist to initiate LEs on to bed    Transfers Overall transfer level: Needs assistance Equipment used: Rolling walker (2 wheeled) Transfers: Sit to/from Stand Sit to Stand: Min assist Stand pivot transfers: Mod assist       General transfer comment: min assist for anterior-superior wt shift; cues for hand placement adn LE position  Ambulation/Gait Ambulation/Gait assistance: Min guard;Min assist Gait Distance (Feet): 50 Feet Assistive device: Rolling walker (2 wheeled) Gait Pattern/deviations: Step-through  pattern;Decreased stride length;Trunk flexed;Narrow base of support     General Gait Details: cues for RW position and safety  Stairs            Wheelchair Mobility    Modified Rankin (Stroke Patients Only)       Balance Overall balance assessment: Needs assistance Sitting-balance support: Single extremity supported Sitting balance-Leahy Scale: Good Sitting balance - Comments: able to wt shift to remove gown   Standing balance support: Bilateral upper extremity supported Standing balance-Leahy Scale: Poor Standing balance comment: reliant on UEs                             Pertinent Vitals/Pain Pain Assessment: Faces Faces Pain Scale: Hurts little more Pain Location: back Pain Descriptors / Indicators: Sore;Tightness Pain Intervention(s): Limited activity within patient's tolerance;Monitored during session;Repositioned    Home Living Family/patient expects to be discharged to:: Private residence Living Arrangements: Children Available Help at Discharge: Family;Available 24 hours/day Type of Home: Apartment Home Access: Level entry     Home Layout: One level Home Equipment: Walker - 2 wheels;Shower seat Additional Comments: info taken from prior notes    Prior Function Level of Independence: Needs assistance   Gait / Transfers Assistance Needed: pt reports amb with rollator  ADL's / Homemaking Assistance Needed: patient reported using AE for LB dressing IE sock aid. patient reported needing assistance for LB dressing tasks but later would report being able to complete independently. patient reported being able to complete bathing at sink and toileting tasks MI with walker at home. patient is a questionable historian will need to follow up with family.  Comments: pt is not an accurate historian     Hand Dominance   Dominant Hand: Right    Extremity/Trunk Assessment   Upper Extremity Assessment Upper Extremity Assessment: Defer to OT  evaluation    Lower Extremity Assessment Lower Extremity Assessment: Generalized weakness       Communication   Communication: HOH  Cognition Arousal/Alertness: Awake/alert Behavior During Therapy: WFL for tasks assessed/performed;Flat affect Overall Cognitive Status: No family/caregiver present to determine baseline cognitive functioning                                        General Comments      Exercises     Assessment/Plan    PT Assessment Patient needs continued PT services  PT Problem List Decreased strength;Decreased range of motion;Decreased activity tolerance;Decreased balance;Decreased mobility;Decreased knowledge of use of DME       PT Treatment Interventions DME instruction;Therapeutic activities;Gait training;Functional mobility training;Therapeutic exercise;Patient/family education;Balance training    PT Goals (Current goals can be found in the Care Plan section)  Acute Rehab PT Goals Patient Stated Goal: wants to go back home PT Goal Formulation: With patient Time For Goal Achievement: 03/19/21 Potential to Achieve Goals: Good    Frequency Min 3X/week   Barriers to discharge        Co-evaluation               AM-PAC PT "6 Clicks" Mobility  Outcome Measure Help needed turning from your back to your side while in a flat bed without using bedrails?: A Little Help needed moving from lying on your back to sitting on the side of a flat bed without using bedrails?: A Little Help needed moving to and from a bed to a chair (including a wheelchair)?: A Little Help needed standing up from a chair using your arms (e.g., wheelchair or bedside chair)?: A Little Help needed to walk in hospital room?: A Little Help needed climbing 3-5 steps with a railing? : A Little 6 Click Score: 18    End of Session Equipment Utilized During Treatment: Gait belt Activity Tolerance: Patient tolerated treatment well Patient left: with call bell/phone  within reach;in bed;with bed alarm set Nurse Communication: Mobility status PT Visit Diagnosis: Other abnormalities of gait and mobility (R26.89)    Time: 0383-3383 PT Time Calculation (min) (ACUTE ONLY): 24 min   Charges:   PT Evaluation $PT Eval Low Complexity: 1 Low PT Treatments $Gait Training: 8-22 mins        Delice Bison, PT  Acute Rehab Dept ([facility]) [phone] Pager [phone]  02/27/2021   [facility] 02/27/2021, 3:18 PM

## 2021-02-28 DIAGNOSIS — E876 Hypokalemia: Secondary | ICD-10-CM | POA: Diagnosis not present

## 2021-02-28 LAB — GLUCOSE, CAPILLARY
Glucose-Capillary: 103 mg/dL — ABNORMAL HIGH (ref 70–99)
Glucose-Capillary: 116 mg/dL — ABNORMAL HIGH (ref 70–99)
Glucose-Capillary: 97 mg/dL (ref 70–99)
Glucose-Capillary: 84 mg/dL (ref 70–99)

## 2021-02-28 LAB — PHOSPHORUS: Phosphorus: 3.8 mg/dL (ref 2.5–4.6)

## 2021-02-28 LAB — CBC
HCT: 44.2 % (ref 36.0–46.0)
Hemoglobin: 14.2 g/dL (ref 12.0–15.0)
MCH: 32.1 pg (ref 26.0–34.0)
MCHC: 32.1 g/dL (ref 30.0–36.0)
MCV: 99.8 fL (ref 80.0–100.0)
Platelets: 484 10*3/uL — ABNORMAL HIGH (ref 150–400)
RBC: 4.43 MIL/uL (ref 3.87–5.11)
RDW: 15.4 % (ref 11.5–15.5)
WBC: 10.2 10*3/uL (ref 4.0–10.5)
nRBC: 0 % (ref 0.0–0.2)

## 2021-02-28 LAB — BASIC METABOLIC PANEL
Anion gap: 5 (ref 5–15)
BUN: 15 mg/dL (ref 8–23)
CO2: 27 mmol/L (ref 22–32)
Calcium: 9 mg/dL (ref 8.9–10.3)
Chloride: 105 mmol/L (ref 98–111)
Creatinine, Ser: 0.84 mg/dL (ref 0.44–1.00)
GFR, Estimated: >60 mL/min (ref >60)
Glucose, Bld: 96 mg/dL (ref 70–99)
Potassium: 3.9 mmol/L (ref 3.5–5.1)
Sodium: 137 mmol/L (ref 135–145)

## 2021-02-28 LAB — MAGNESIUM: Magnesium: 2.2 mg/dL (ref 1.7–2.4)

## 2021-02-28 NOTE — Progress Notes (Signed)
PROGRESS NOTE    Sue [name]  ELF:[id number] DOB: Mar 29, 1930 DOA: 02/26/2021 PCP: Sigmund Hazel, MD    Brief Narrative:  This 87 years old female with PMH significant for chronic diastolic CHF, type 2 diabetes, hypertension, hyperlipidemia, hypothyroidism, GERD, depression, hard of hearing and recent admission for acute CVA presented to the ED with generalized weakness. Patient was recently admitted from 01/25/2021-02/01/2021 after developing sudden onset right-sided weakness.  She was found to have suspected cardioembolic acute and subacute infarcts of the bilateral basal ganglia and corona radiata as well as right occipitoparietal lobes.  She received tPA.  She was started on DAPT with aspirin/Plavix for 3 weeks, now on aspirin alone.Patient was discharged to CIR where she remained until discharge to home on 02/23/21.  She has developed acute urinary retention requiring Foley catheter and UTI growing E. coli and Citrobacter she was treated with Macrobid twice in rehab.  Patient was admitted for generalized weakness, She is found to have hypokalemia.   Assessment & Plan:   Principal Problem:   Hypokalemia Active Problems:   Chronic diastolic CHF (congestive heart failure) (HCC)   Hypertension associated with diabetes (HCC)   Hyperlipidemia associated with type 2 diabetes mellitus (HCC)   Hypothyroidism   Diabetes mellitus type 2 in obese (HCC)   Urinary retention   Generalized weakness   Hypokalemia: > Improved Patient presented with generalized weakness, found to have potassium 2.3, Likely sec. to Lasix.  Denies any GI losses.   Replacement completed, potassium has improved. Will resume Lasix tomorrow.  Generalized weakness with falls at home: In setting of recent prolonged admission after acute CVA and hypokalemia.   As reported PT/OT/SLP set up for home but has not begun yet. PT/ OT recommended home PT and OT.  Suspected cardioembolic CVA: MRI brain 5/23 showed small  acute/subacute infarcts involving b/l basal ganglia and corona radiata, small-moderate infarct with petechial hemorrhage right occipitoparietal lobes. Continue aspirin and statin Follow-up with neurology outpatient.   Urinary retention with Foley catheter in place: She has developed urinary retention while in inpatient rehab.   Foley catheter remains in place.  Recently treated for Citrobacter and E. coli UTI with Macrobid.   Urinalysis on admission likely colonized without acute infection. Continue Foley care, consider voiding trial. Discontinue sertraline   Chronic diastolic CHF: Stable without acute decompensation.   Resume with lasix tomorrow. Monitor strict I/O's.   Type 2 diabetes: A1c 6.9%.  Not on medical management as an outpatient.   Continue Regular insulin sliding scale.  Hypertension: Continue Coreg.   Hypothyroidism: Continue Synthroid.   Hyperlipidemia: Continue simvastatin.   Depression: Continue bupropion.   DVT prophylaxis: Lovenox Code Status: Full code. Family Communication: No family at bed side. Disposition Plan:   Status is: Observation  The patient remains OBS appropriate and will d/c before 2 midnights.  Dispo: The patient is from: Home              Anticipated d/c is to: Home              Patient currently is not medically stable to d/c.   Difficult to place patient No  Consultants:  None  Procedures: None  Antimicrobials:   Anti-infectives (From admission, onward)    None      Subjective: Patient was seen and examined at bedside.  Overnight events noted.  Patient is very hard of hearing.   Patient reports feeling improved after getting IV fluids.  Her potassium has improved. She still feels generally  weak, denies any pain.   Objective: Vitals:   02/27/21 2010 02/28/21 0500 02/28/21 0525 02/28/21 1106  BP: 118/73  (!) 147/79 123/64  Pulse: 74  74 73  Resp: 16  16   Temp: (!) 97.5 F (36.4 C)  97.9 F (36.6 C) 97.7 F  (36.5 C)  TempSrc: Oral   Oral  SpO2: 92%  96% 95%  Weight:  78.4 kg    Height:        Intake/Output Summary (Last 24 hours) at 02/28/2021 1442 Last data filed at 02/28/2021 1030 Gross per 24 hour  Intake 290 ml  Output 401 ml  Net -111 ml   Filed Weights   02/26/21 1834 02/28/21 0500  Weight: 83 kg 78.4 kg    Examination:  General exam: Appears calm and comfortable, not in any acute distress. Respiratory system: Clear to auscultation. Respiratory effort normal. Cardiovascular system: S1 & S2 heard, RRR. No JVD, murmurs, rubs, gallops or clicks. No pedal edema. Gastrointestinal system: Abdomen is nondistended, soft and nontender. No organomegaly or masses felt. Normal bowel sounds heard. Central nervous system: Alert and oriented. No focal neurological deficits. Extremities: Symmetric 5 x 5 power.  No edema, no cyanosis, no clubbing. Skin: No rashes, lesions or ulcers Psychiatry: Judgement and insight appear normal. Mood & affect appropriate.     Data Reviewed: I have personally reviewed following labs and imaging studies  CBC: Recent Labs  Lab 02/23/21 0759 02/26/21 1933 02/27/21 0340 02/28/21 0424  WBC 8.5 11.6* 11.3* 10.2  NEUTROABS  --  8.1*  --   --   HGB 16.2* 16.7* 16.8* 14.2  HCT 49.8* 50.1* 52.7* 44.2  MCV 96.7 96.7 99.1 99.8  PLT 548* 528* 472* 484*   Basic Metabolic Panel: Recent Labs  Lab 02/23/21 0759 02/26/21 1933 02/27/21 0340 02/28/21 0424  NA 134* 141 138 137  K 3.6 2.5* 3.6 3.9  CL 96* 108 104 105  CO2 26 25 23 27   GLUCOSE 156* 114* 103* 96  BUN 14 12 14 15   CREATININE 1.12* 0.83 0.82 0.84  CALCIUM 9.6 7.9* 8.9 9.0  MG  --  1.8 2.0 2.2  PHOS  --   --   --  3.8   GFR: Estimated Creatinine Clearance: 46 mL/min (by C-G formula based on SCr of 0.84 mg/dL). Liver Function Tests: Recent Labs  Lab 02/27/21 0340  AST 24  ALT 21  ALKPHOS 74  BILITOT 0.6  PROT 6.6  ALBUMIN 3.5   No results for input(s): LIPASE, AMYLASE in the last  168 hours. No results for input(s): AMMONIA in the last 168 hours. Coagulation Profile: No results for input(s): INR, PROTIME in the last 168 hours. Cardiac Enzymes: No results for input(s): CKTOTAL, CKMB, CKMBINDEX, TROPONINI in the last 168 hours. BNP (last 3 results) No results for input(s): PROBNP in the last 8760 hours. HbA1C: No results for input(s): HGBA1C in the last 72 hours. CBG: Recent Labs  Lab 02/27/21 1215 02/27/21 1650 02/27/21 2013 02/28/21 0729 02/28/21 1058  GLUCAP 133* 90 130* 103* 116*   Lipid Profile: No results for input(s): CHOL, HDL, LDLCALC, TRIG, CHOLHDL, LDLDIRECT in the last 72 hours. Thyroid Function Tests: No results for input(s): TSH, T4TOTAL, FREET4, T3FREE, THYROIDAB in the last 72 hours. Anemia Panel: No results for input(s): VITAMINB12, FOLATE, FERRITIN, TIBC, IRON, RETICCTPCT in the last 72 hours. Sepsis Labs: No results for input(s): PROCALCITON, LATICACIDVEN in the last 168 hours.  Recent Results (from the past 240 hour(s))  SARS  CORONAVIRUS 2 (TAT 6-24 HRS) Nasopharyngeal Nasopharyngeal Swab     Status: None   Collection Time: 02/26/21 11:17 PM   Specimen: Nasopharyngeal Swab  Result Value Ref Range Status   SARS Coronavirus 2 NEGATIVE NEGATIVE Final    Comment: (NOTE) SARS-CoV-2 target nucleic acids are NOT DETECTED.  The SARS-CoV-2 RNA is generally detectable in upper and lower respiratory specimens during the acute phase of infection. Negative results do not preclude SARS-CoV-2 infection, do not rule out co-infections with other pathogens, and should not be used as the sole basis for treatment or other patient management decisions. Negative results must be combined with clinical observations, patient history, and epidemiological information. The expected result is Negative.  Fact Sheet for Patients: HairSlick.no  Fact Sheet for Healthcare  Providers: quierodirigir.com  This test is not yet approved or cleared by the [place] FDA and  has been authorized for detection and/or diagnosis of SARS-CoV-2 by FDA under an Emergency Use Authorization (EUA). This EUA will remain  in effect (meaning this test can be used) for the duration of the COVID-19 declaration under Se ction 564(b)(1) of the Act, 21 U.S.C. section 360bbb-3(b)(1), unless the authorization is terminated or revoked sooner.  Performed at [facility], [address] [place], [place] [address]     Radiology Studies: No results found.  Scheduled Meds:  aspirin  81 mg Oral Daily   buPROPion  150 mg Oral BID   carvedilol  3.125 mg Oral BID WC   enoxaparin (LOVENOX) injection  40 mg Subcutaneous Q24H   insulin aspart  0-9 Units Subcutaneous TID WC   levothyroxine  112 mcg Oral Q0600   potassium chloride SA  20 mEq Oral Daily   simvastatin  40 mg Oral q1800   sodium chloride flush  3 mL Intravenous Q12H   Continuous Infusions:   LOS: 0 days    Time spent: 25 MINS    Kareena Arrambide, MD [facility]   If 7PM-7AM, please contact night-coverage

## 2021-02-28 NOTE — Plan of Care (Signed)
  Problem: Coping: Goal: Level of anxiety will decrease Outcome: Not Progressing constantly asking to go home

## 2021-03-01 DIAGNOSIS — Z9181 History of falling: Secondary | ICD-10-CM | POA: Diagnosis not present

## 2021-03-01 DIAGNOSIS — R5382 Chronic fatigue, unspecified: Secondary | ICD-10-CM | POA: Diagnosis present

## 2021-03-01 DIAGNOSIS — Z20822 Contact with and (suspected) exposure to covid-19: Secondary | ICD-10-CM | POA: Diagnosis present

## 2021-03-01 DIAGNOSIS — E876 Hypokalemia: Secondary | ICD-10-CM | POA: Diagnosis present

## 2021-03-01 DIAGNOSIS — I152 Hypertension secondary to endocrine disorders: Secondary | ICD-10-CM | POA: Diagnosis present

## 2021-03-01 DIAGNOSIS — E039 Hypothyroidism, unspecified: Secondary | ICD-10-CM | POA: Diagnosis present

## 2021-03-01 DIAGNOSIS — Y846 Urinary catheterization as the cause of abnormal reaction of the patient, or of later complication, without mention of misadventure at the time of the procedure: Secondary | ICD-10-CM | POA: Diagnosis present

## 2021-03-01 DIAGNOSIS — Z6831 Body mass index (BMI) 31.0-31.9, adult: Secondary | ICD-10-CM | POA: Diagnosis not present

## 2021-03-01 DIAGNOSIS — Z833 Family history of diabetes mellitus: Secondary | ICD-10-CM | POA: Diagnosis not present

## 2021-03-01 DIAGNOSIS — N390 Urinary tract infection, site not specified: Secondary | ICD-10-CM | POA: Diagnosis present

## 2021-03-01 DIAGNOSIS — R296 Repeated falls: Secondary | ICD-10-CM | POA: Diagnosis present

## 2021-03-01 DIAGNOSIS — T83518A Infection and inflammatory reaction due to other urinary catheter, initial encounter: Secondary | ICD-10-CM | POA: Diagnosis present

## 2021-03-01 DIAGNOSIS — F32A Depression, unspecified: Secondary | ICD-10-CM | POA: Diagnosis present

## 2021-03-01 DIAGNOSIS — Z87891 Personal history of nicotine dependence: Secondary | ICD-10-CM | POA: Diagnosis not present

## 2021-03-01 DIAGNOSIS — Z7982 Long term (current) use of aspirin: Secondary | ICD-10-CM | POA: Diagnosis not present

## 2021-03-01 DIAGNOSIS — I5032 Chronic diastolic (congestive) heart failure: Secondary | ICD-10-CM | POA: Diagnosis present

## 2021-03-01 DIAGNOSIS — Z79890 Hormone replacement therapy (postmenopausal): Secondary | ICD-10-CM | POA: Diagnosis not present

## 2021-03-01 DIAGNOSIS — E1169 Type 2 diabetes mellitus with other specified complication: Secondary | ICD-10-CM | POA: Diagnosis present

## 2021-03-01 DIAGNOSIS — H9190 Unspecified hearing loss, unspecified ear: Secondary | ICD-10-CM | POA: Diagnosis present

## 2021-03-01 DIAGNOSIS — I69351 Hemiplegia and hemiparesis following cerebral infarction affecting right dominant side: Secondary | ICD-10-CM | POA: Diagnosis not present

## 2021-03-01 DIAGNOSIS — I110 Hypertensive heart disease with heart failure: Secondary | ICD-10-CM | POA: Diagnosis present

## 2021-03-01 DIAGNOSIS — I5042 Chronic combined systolic (congestive) and diastolic (congestive) heart failure: Secondary | ICD-10-CM | POA: Diagnosis not present

## 2021-03-01 DIAGNOSIS — E785 Hyperlipidemia, unspecified: Secondary | ICD-10-CM | POA: Diagnosis present

## 2021-03-01 DIAGNOSIS — Z87440 Personal history of urinary (tract) infections: Secondary | ICD-10-CM | POA: Diagnosis not present

## 2021-03-01 DIAGNOSIS — B9620 Unspecified Escherichia coli [E. coli] as the cause of diseases classified elsewhere: Secondary | ICD-10-CM | POA: Diagnosis present

## 2021-03-01 DIAGNOSIS — E669 Obesity, unspecified: Secondary | ICD-10-CM | POA: Diagnosis present

## 2021-03-01 LAB — GLUCOSE, CAPILLARY
Glucose-Capillary: 101 mg/dL — ABNORMAL HIGH (ref 70–99)
Glucose-Capillary: 155 mg/dL — ABNORMAL HIGH (ref 70–99)
Glucose-Capillary: 76 mg/dL (ref 70–99)
Glucose-Capillary: 88 mg/dL (ref 70–99)

## 2021-03-01 NOTE — TOC Transition Note (Addendum)
Transition of Care [facility]) - CM/SW Discharge Note   Patient Details  Name: ADELFA LOZITO MRN: [id number] Date of Birth: 08-20-1930  Transition of Care [facility]) CM/SW Contact:  Lanier Clam, RN Phone Number: 03/01/2021, 12:04 PM   Clinical Narrative:  [facility] rep Kandee Keen able to accept for HHPT/OT-ordered. Thermon Leyland son has not returned call. MD notified.  2p-Have TC again to [facility] son @ tel#[phone]-unable to leave messages,vm not set up-MD notified. 4:14p-spoke to [facility] son-informed of HH all set up, & custodial level care to call Edwena Blow tel#[phone]-Blaine voiced understanding. Informed that I can set up transportation if needed-would need auth from [employer] insurance-he said he is not @ home & just became aware of d/c-informed to contact attending. MD notified. [facility] [employer]-spoke to rep Edwena Blow was already aware of referral for transportation auth-she will work on it for tomorrow.     Final next level of care: Home w Home Health Services Barriers to Discharge: No Barriers Identified   Patient Goals and CMS Choice Patient states their goals for this hospitalization and ongoing recovery are:: To get better CMS Medicare.gov Compare Post Acute Care list provided to:: Patient Represenative (must comment) Choice offered to / list presented to : Adult Children  Discharge Placement                       Discharge Plan and Services   Discharge Planning Services: CM Consult Post Acute Care Choice: Home Health                    HH Arranged: PT, OT [facility] Agency: [facility] Date [facility] Agency Contacted: 03/01/21 Time HH Agency Contacted: 1204 Representative spoke with at [facility] Agency: Kandee Keen  Social Determinants of Health (SDOH) Interventions     Readmission Risk Interventions No flowsheet data found.

## 2021-03-01 NOTE — TOC Progression Note (Signed)
Transition of Care [facility]) - Progression Note    Patient Details  Name: Sue [name] MRN: [id number] Date of Birth: 1930/01/23  Transition of Care [facility]) CM/SW Contact  Argenis Kumari, Olegario Messier, RN Phone Number: 03/01/2021, 11:02 AM  Clinical Narrative: CM attempted to reach Blaine(son)unsuccessful-unable to leave messages tel#[phone].Currently trying to get [facility] agency to accept for HHPT/OT d/c today. Demetrios Loll is a custodial level services for private duty care-director Smitty Cords [phone])-has attempted to reach son Blaine-unsuccessful. Will await HHC agency to accept.    Expected Discharge Plan: Home w Home Health Services Barriers to Discharge: No Home Care Agency will accept this patient  Expected Discharge Plan and Services Expected Discharge Plan: Home w Home Health Services       Living arrangements for the past 2 months: Single Family Home Expected Discharge Date: 03/01/21                                     Social Determinants of Health (SDOH) Interventions    Readmission Risk Interventions No flowsheet data found.

## 2021-03-01 NOTE — Progress Notes (Signed)
Was able to get patient's son on the phone and informed him the patient have been discharged and the case manager have been trying to reach him still 11am and was not able to leave a voice mail. Son angry stated that he is not in the position of picking patient up today because he assumed he will be given 2day notice about her discharge. Case manager  and Dr. Lucianne Muss informed to F/U with patient's son- Sue [name].

## 2021-03-01 NOTE — Discharge Summary (Addendum)
Physician Discharge Summary  Eleanora NeighborJocelyn J Speelman ZOX:[id number] DOB: 05/18/1930 DOA: 02/26/2021  PCP: Sigmund HazelMiller, Lisa, MD  Admit date: 02/26/2021.   Discharge date: 03/01/2021  Admitted From: Home.  Disposition:  Home health services.  Recommendations for Outpatient Follow-up:  Follow up with PCP in 1-2 weeks Please obtain BMP/CBC in one week Advised to continue home PT and OT, appointment has been made.  Home Health:Yes Home PT/OT Equipment/Devices: None  Discharge Condition: Good CODE STATUS:Full code Diet recommendation: Heart Healthy   [facility]: This 87 years old female with PMH significant for chronic diastolic CHF, type 2 diabetes, hypertension, hyperlipidemia, hypothyroidism, GERD, depression, hard of hearing and recent admission for acute CVA presented in the ED with generalized weakness. Patient was recently admitted from 01/25/2021 - 02/01/2021 after developing sudden onset of right-sided weakness.She was found to have suspected cardioembolic acute and subacute infarcts of the bilateral basal ganglia and corona radiata as well as right occipitoparietal lobes.  She received tPA.  She was started on DAPT with aspirin/Plavix for 3 weeks, now on aspirin alone. Patient was discharged to CIR where she remained until discharge to home on 02/23/21.  She has developed acute urinary retention requiring Foley catheter and UTI growing E. coli and Citrobacter,  she was treated with Macrobid twice in rehab. Patient was admitted for generalized weakness, She is found to have hypokalemia ( potassium 2.5). Which was replaced and corrected.Patient was also continued on IV hydration.  She feels much improved.  PT and OT recommended home PT/OT.  Home health services been arranged.  Patient is being discharged home.  Patient will follow up with neurologist.  She was managed for below problems.   Discharge Diagnoses:  Principal Problem:   Hypokalemia Active Problems:   Chronic  diastolic CHF (congestive heart failure) (HCC)   Hypertension associated with diabetes (HCC)   Hyperlipidemia associated with type 2 diabetes mellitus (HCC)   Hypothyroidism   Diabetes mellitus type 2 in obese (HCC)   Urinary retention   Generalized weakness  Hypokalemia: > Improved Patient presented with generalized weakness, found to have potassium 2.3, Likely sec. to Lasix.  Denies any GI losses.   Replacement completed, potassium has improved. Resume Lasix at discharge.   Generalized weakness with falls at home: In setting of recent prolonged admission after acute CVA and hypokalemia.   As reported PT/OT/SLP set up for home but has not begun yet. PT/ OT recommended home PT and OT.   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 Continue aspirin and statin Follow-up with neurology outpatient.   Urinary retention with Foley catheter in place: She has developed urinary retention while in inpatient rehab.   Foley catheter remains in place.  Recently treated for Citrobacter and E. coli UTI with Macrobid.   Urinalysis on admission likely colonized without acute infection. Continue Foley care, outpatient urology follow up. Discontinue sertraline   Chronic diastolic CHF: Stable without acute decompensation.   Resume lasix at discharge Monitor strict I/O's.   Type 2 diabetes: A1c 6.9%.  Not on medical management as an outpatient.   Continue Regular insulin sliding scale.   Hypertension: Continue Coreg.   Hypothyroidism: Continue Synthroid.   Hyperlipidemia: Continue simvastatin.   Depression: Continue bupropion.    Discharge Instructions  Discharge Instructions     Call MD for:  difficulty breathing, headache or visual disturbances   Complete by: As directed  Call MD for:  persistant dizziness or  light-headedness   Complete by: As directed    Call MD for:  persistant nausea and vomiting   Complete by: As directed    Diet - low sodium heart healthy   Complete by: As directed    Diet Carb Modified   Complete by: As directed    Discharge instructions   Complete by: As directed    Advised to follow-up with primary care physician in 1 week. Advised to continue home PT and OT, appointment has been made.   Increase activity slowly   Complete by: As directed       Allergies as of 03/01/2021   No Known Allergies      Medication List     STOP taking these medications    benzocaine 10 % mucosal gel Commonly known as: ORAJEL   diclofenac Sodium 1 % Gel Commonly known as: VOLTAREN   mouth rinse Liqd solution   pantoprazole 40 MG tablet Commonly known as: PROTONIX   senna-docusate 8.6-50 MG tablet Commonly known as: Senokot-S   tobramycin-dexamethasone ophthalmic solution Commonly known as: TOBRADEX       TAKE these medications    acetaminophen 325 MG tablet Commonly known as: TYLENOL Take 1-2 tablets (325-650 mg total) by mouth every 4 (four) hours as needed for mild pain.   aspirin 81 MG chewable tablet Chew 1 tablet (81 mg total) by mouth daily.   buPROPion 150 MG 12 hr tablet Commonly known as: WELLBUTRIN SR Take 1 tablet (150 mg total) by mouth 2 (two) times daily.   carvedilol 3.125 MG tablet Commonly known as: COREG Take 1 tablet (3.125 mg total) by mouth 2 (two) times daily with a meal.   furosemide 40 MG tablet Commonly known as: LASIX Take 1 tablet (40 mg total) by mouth 2 (two) times daily.   levothyroxine 112 MCG tablet Commonly known as: SYNTHROID Take 1 tablet (112 mcg total) by mouth daily before breakfast.   nitrofurantoin (macrocrystal-monohydrate) 100 MG capsule Commonly known as: MACROBID Take 1 capsule (100 mg total) by mouth every 12 (twelve) hours.   polyethylene glycol 17 g packet Commonly known as: MIRALAX / GLYCOLAX Take  17 g by mouth daily. What changed:  when to take this reasons to take this   potassium chloride SA 20 MEQ tablet Commonly known as: KLOR-CON Take 1 tablet (20 mEq total) by mouth daily.   simvastatin 40 MG tablet Commonly known as: ZOCOR Take 1 tablet (40 mg total) by mouth daily at 6 PM. What changed: when to take this        Follow-up Information     Sigmund Hazel, MD Follow up in 1 week(s).   Specialty: Family Medicine Contact information: [address] [place] [address] [phone]         Jodelle Red, MD .   Specialty: Cardiology Contact information: [address] [address] [place] [address] [phone]         [facility], [facility] Follow up.   Specialty: Home Health Services Why: HH physical therapy/occupational therapy Contact information: [address] [place] [address] [phone]                No Known Allergies  Consultations: None   Procedures/Studies: No results found.  Subjective: Patient was seen and examined at bedside.  Overnight events noted.   Patient reports feeling much improved.  Weakness has resolved.  Patient wants to be discharged.  Services  been arranged.  Discharge Exam: Vitals:   03/01/21 0954 03/01/21 1255  BP: (!) 114/56 120/67  Pulse: 69 70  Resp:  20  Temp:  97.8 F (36.6 C)  SpO2:  95%   Vitals:   03/01/21 0500 03/01/21 0529 03/01/21 0954 03/01/21 1255  BP:  (!) 143/73 (!) 114/56 120/67  Pulse:  70 69 70  Resp:  15  20  Temp:  97.8 F (36.6 C)  97.8 F (36.6 C)  TempSrc:    Oral  SpO2:  95%  95%  Weight: 80 kg     Height:        General: Pt is alert, awake, not in acute distress Cardiovascular: RRR, S1/S2 +, no rubs, no gallops Respiratory: CTA bilaterally, no wheezing, no rhonchi Abdominal: Soft, NT, ND, bowel sounds + Extremities: no edema, no cyanosis    The results of significant diagnostics from this hospitalization (including  imaging, microbiology, ancillary and laboratory) are listed below for reference.     Microbiology: Recent Results (from the past 240 hour(s))  SARS CORONAVIRUS 2 (TAT 6-24 HRS) Nasopharyngeal Nasopharyngeal Swab     Status: None   Collection Time: 02/26/21 11:17 PM   Specimen: Nasopharyngeal Swab  Result Value Ref Range Status   SARS Coronavirus 2 NEGATIVE NEGATIVE Final    Comment: (NOTE) SARS-CoV-2 target nucleic acids are NOT DETECTED.  The SARS-CoV-2 RNA is generally detectable in upper and lower respiratory specimens during the acute phase of infection. Negative results do not preclude SARS-CoV-2 infection, do not rule out co-infections with other pathogens, and should not be used as the sole basis for treatment or other patient management decisions. Negative results must be combined with clinical observations, patient history, and epidemiological information. The expected result is Negative.  Fact Sheet for Patients: HairSlick.no  Fact Sheet for Healthcare Providers: quierodirigir.com  This test is not yet approved or cleared by the [place] FDA and  has been authorized for detection and/or diagnosis of SARS-CoV-2 by FDA under an Emergency Use Authorization (EUA). This EUA will remain  in effect (meaning this test can be used) for the duration of the COVID-19 declaration under Se ction 564(b)(1) of the Act, 21 U.S.C. section 360bbb-3(b)(1), unless the authorization is terminated or revoked sooner.  Performed at [facility], [address], [place], [place] [address]      Labs: BNP (last 3 results) Recent Labs    01/09/21 1027  BNP 57.0   Basic Metabolic Panel: Recent Labs  Lab 02/23/21 0759 02/26/21 1933 02/27/21 0340 02/28/21 0424  NA 134* 141 138 137  K 3.6 2.5* 3.6 3.9  CL 96* 108 104 105  CO2 26 25 23 27   GLUCOSE 156* 114* 103* 96  BUN 14 12 14 15   CREATININE 1.12* 0.83 0.82 0.84   CALCIUM 9.6 7.9* 8.9 9.0  MG  --  1.8 2.0 2.2  PHOS  --   --   --  3.8   Liver Function Tests: Recent Labs  Lab 02/27/21 0340  AST 24  ALT 21  ALKPHOS 74  BILITOT 0.6  PROT 6.6  ALBUMIN 3.5   No results for input(s): LIPASE, AMYLASE in the last 168 hours. No results for input(s): AMMONIA in the last 168 hours. CBC: Recent Labs  Lab 02/23/21 0759 02/26/21 1933 02/27/21 0340 02/28/21 0424  WBC 8.5 11.6* 11.3* 10.2  NEUTROABS  --  8.1*  --   --   HGB 16.2* 16.7* 16.8* 14.2  HCT 49.8* 50.1*  52.7* 44.2  MCV 96.7 96.7 99.1 99.8  PLT 548* 528* 472* 484*   Cardiac Enzymes: No results for input(s): CKTOTAL, CKMB, CKMBINDEX, TROPONINI in the last 168 hours. BNP: Invalid input(s): POCBNP CBG: Recent Labs  Lab 02/28/21 1058 02/28/21 1633 02/28/21 2219 03/01/21 0811 03/01/21 1249  GLUCAP 116* 97 84 101* 155*   D-Dimer No results for input(s): DDIMER in the last 72 hours. Hgb A1c No results for input(s): HGBA1C in the last 72 hours. Lipid Profile No results for input(s): CHOL, HDL, LDLCALC, TRIG, CHOLHDL, LDLDIRECT in the last 72 hours. Thyroid function studies No results for input(s): TSH, T4TOTAL, T3FREE, THYROIDAB in the last 72 hours.  Invalid input(s): FREET3 Anemia work up No results for input(s): VITAMINB12, FOLATE, FERRITIN, TIBC, IRON, RETICCTPCT in the last 72 hours. Urinalysis    Component Value Date/Time   COLORURINE YELLOW 02/26/2021 1933   APPEARANCEUR HAZY (A) 02/26/2021 1933   LABSPEC 1.009 02/26/2021 1933   PHURINE 6.0 02/26/2021 1933   GLUCOSEU NEGATIVE 02/26/2021 1933   HGBUR SMALL (A) 02/26/2021 1933   BILIRUBINUR NEGATIVE 02/26/2021 1933   KETONESUR NEGATIVE 02/26/2021 1933   PROTEINUR NEGATIVE 02/26/2021 1933   NITRITE NEGATIVE 02/26/2021 1933   LEUKOCYTESUR LARGE (A) 02/26/2021 1933   Sepsis Labs Invalid input(s): PROCALCITONIN,  WBC,  LACTICIDVEN Microbiology Recent Results (from the past 240 hour(s))  SARS CORONAVIRUS 2 (TAT  6-24 HRS) Nasopharyngeal Nasopharyngeal Swab     Status: None   Collection Time: 02/26/21 11:17 PM   Specimen: Nasopharyngeal Swab  Result Value Ref Range Status   SARS Coronavirus 2 NEGATIVE NEGATIVE Final    Comment: (NOTE) SARS-CoV-2 target nucleic acids are NOT DETECTED.  The SARS-CoV-2 RNA is generally detectable in upper and lower respiratory specimens during the acute phase of infection. Negative results do not preclude SARS-CoV-2 infection, do not rule out co-infections with other pathogens, and should not be used as the sole basis for treatment or other patient management decisions. Negative results must be combined with clinical observations, patient history, and epidemiological information. The expected result is Negative.  Fact Sheet for Patients: HairSlick.no  Fact Sheet for Healthcare Providers: quierodirigir.com  This test is not yet approved or cleared by the [place] FDA and  has been authorized for detection and/or diagnosis of SARS-CoV-2 by FDA under an Emergency Use Authorization (EUA). This EUA will remain  in effect (meaning this test can be used) for the duration of the COVID-19 declaration under Se ction 564(b)(1) of the Act, 21 U.S.C. section 360bbb-3(b)(1), unless the authorization is terminated or revoked sooner.  Performed at [facility], [address] [place], [place] [address]      Time coordinating discharge: Over 30 minutes  SIGNED:   Cipriano Bunker, MD  [facility] 03/01/2021, 2:13 PM Pager   If 7PM-7AM, please contact night-coverage [email or web address]

## 2021-03-01 NOTE — Discharge Instructions (Signed)
Advised to follow-up with primary care physician in 1 week. Advised to continue home PT and OT, appointment has been made.

## 2021-03-02 LAB — GLUCOSE, CAPILLARY
Glucose-Capillary: 103 mg/dL — ABNORMAL HIGH (ref 70–99)
Glucose-Capillary: 105 mg/dL — ABNORMAL HIGH (ref 70–99)
Glucose-Capillary: 99 mg/dL (ref 70–99)
Glucose-Capillary: 134 mg/dL — ABNORMAL HIGH (ref 70–99)

## 2021-03-02 LAB — URINALYSIS, ROUTINE W REFLEX MICROSCOPIC
Bilirubin Urine: NEGATIVE
Glucose, UA: NEGATIVE mg/dL
Ketones, ur: NEGATIVE mg/dL
Nitrite: POSITIVE — AB
Protein, ur: NEGATIVE mg/dL
Specific Gravity, Urine: 1.01 (ref 1.005–1.030)
WBC, UA: >50 WBC/hpf — ABNORMAL HIGH (ref 0–5)
pH: 6 (ref 5.0–8.0)

## 2021-03-02 MED ORDER — SODIUM CHLORIDE 0.9 % IV SOLN
1.0000 g | INTRAVENOUS | Status: DC
  Administered 2021-03-02 – 2021-03-03 (×2): 1 g via INTRAVENOUS
  Filled 2021-03-02: qty 10
  Filled 2021-03-02 (×2): qty 1

## 2021-03-02 NOTE — TOC Progression Note (Signed)
Transition of Care [facility]) - Progression Note    Patient Details  Name: SAMANTH MIRKIN MRN: [id number] Date of Birth: 11-28-1929  Transition of Care [facility]) CM/SW Contact  Jennifer Payes, Olegario Messier, RN Phone Number: 03/02/2021, 8:59 AM  Clinical Narrative: received call from HTA rep [facility] for PTAR SAYT#[id number] eff 6/27.      Expected Discharge Plan: Home w Home Health Services Barriers to Discharge: No Barriers Identified  Expected Discharge Plan and Services Expected Discharge Plan: Home w Home Health Services   Discharge Planning Services: CM Consult Post Acute Care Choice: Home Health Living arrangements for the past 2 months: Single Family Home Expected Discharge Date: 03/01/21                         HH Arranged: PT, OT HH Agency: [facility] Date [facility] Agency Contacted: 03/01/21 Time HH Agency Contacted: 1204 Representative spoke with at [facility] Agency: Kandee Keen   Social Determinants of Health (SDOH) Interventions    Readmission Risk Interventions No flowsheet data found.

## 2021-03-02 NOTE — Plan of Care (Signed)
  Problem: Education: Goal: Knowledge of General Education information will improve Description Including pain rating scale, medication(s)/side effects and non-pharmacologic comfort measures Outcome: Progressing   

## 2021-03-02 NOTE — TOC Progression Note (Addendum)
Transition of Care [facility]) - Progression Note    Patient Details  Name: Sue [name] MRN: [id number] Date of Birth: Jun 03, 1930  Transition of Care [facility]) CM/SW Contact  Darleene Cleaver, [place] Phone Number: 03/02/2021, 4:39 PM  Clinical Narrative:     Per attending physician patient not medically ready for discharge today.  CSW updated [employer] regarding EMS pre approval.  Per [employer] Pre approval valid for 90 days.  Per patient's son she will need EMS transport home, address verified with patient's son.  Frances Furbish agreeable to accept patient for home health services.   Expected Discharge Plan: Home w Home Health Services Barriers to Discharge: Barriers Resolved  Expected Discharge Plan and Services Expected Discharge Plan: Home w Home Health Services   Discharge Planning Services: CM Consult Post Acute Care Choice: Home Health Living arrangements for the past 2 months: Single Family Home Expected Discharge Date: 03/01/21                         HH Arranged: PT, OT HH Agency: [facility] Date [facility] Agency Contacted: 03/01/21 Time HH Agency Contacted: 1204 Representative spoke with at [facility] Agency: Kandee Keen   Social Determinants of Health (SDOH) Interventions    Readmission Risk Interventions No flowsheet data found.

## 2021-03-02 NOTE — Progress Notes (Signed)
PROGRESS NOTE    Sue [name]  EHM:[id number] DOB: 1930/06/01 DOA: 02/26/2021 PCP: Sigmund Hazel, MD    Brief Narrative:  This 87 years old female with PMH significant for chronic diastolic CHF, type 2 diabetes, hypertension, hyperlipidemia, hypothyroidism, GERD, depression, hard of hearing and recent admission for acute CVA presented in the ED with generalized weakness. Patient was recently admitted from 01/25/2021 - 02/01/2021 after developing sudden onset of right-sided weakness.She was found to have suspected cardioembolic acute and subacute infarcts of the bilateral basal ganglia and corona radiata as well as right occipitoparietal lobes.  She received tPA.  She was started on DAPT with aspirin/Plavix for 3 weeks, now on aspirin alone. Patient was discharged to CIR where she remained until discharge to home on 02/23/21. She has developed acute urinary retention requiring Foley catheter and UTI growing E. coli and Citrobacter,  she was treated with Macrobid twice in rehab. Patient was admitted for generalized weakness, She is found to have hypokalemia ( potassium 2.5). Which was replaced and corrected. Patient was also continued on IV hydration.  She feels much improved.  PT and OT recommended home PT/OT.  Home health services been arranged.  Patient was discharged yesterday . She is complaining of burning urination, UA consistent with UTI,  started on ceftriaxone,  urine culture sent,  discharge held until cultures back.  Assessment & Plan:   Principal Problem:   Hypokalemia Active Problems:   Chronic diastolic CHF (congestive heart failure) (HCC)   Hypertension associated with diabetes (HCC)   Hyperlipidemia associated with type 2 diabetes mellitus (HCC)   Hypothyroidism   Diabetes mellitus type 2 in obese [facility])   Urinary retention   Generalized weakness  UTI: UA : Leukocytes large, nitrite positive. Started on ceftriaxone follow-up urine culture.  Hypokalemia: > Improved Patient  presented with generalized weakness, found to have potassium 2.3, Likely sec. to Lasix.  Denies any GI losses.   Replacement completed, potassium has improved. Resume Lasix.  Generalized weakness with falls at home: In setting of recent prolonged admission after acute CVA and hypokalemia.   As reported PT/OT/SLP set up for home but has not begun yet. PT/ OT recommended home PT and OT.  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 Continue aspirin and statin Follow-up with neurology outpatient.   Urinary retention with Foley catheter in place: She has developed urinary retention while in inpatient rehab.   Foley catheter remains in place.  Recently treated for Citrobacter and E. coli UTI with Macrobid.   Urinalysis on admission likely colonized without acute infection. Continue Foley care,  Discontinue sertraline   Chronic diastolic CHF: Stable without acute decompensation.   Resume with lasix tomorrow. Monitor strict I/O's.   Type 2 diabetes: A1c 6.9%.  Not on medical management as an outpatient.   Continue Regular insulin sliding scale while in hospital.  Hypertension: Continue Coreg.   Hypothyroidism: Continue Synthroid.   Hyperlipidemia: Continue simvastatin.   Depression: Continue bupropion.   DVT prophylaxis: Lovenox Code Status: Full code. Family Communication: No family at bed side. Disposition Plan:   Status is: Observation  The patient remains OBS appropriate and will d/c before 2 midnights.  Dispo: The patient is from: Home              Anticipated d/c is to: Home              Patient currently  is not medically stable to d/c.   Difficult to place patient No  Consultants:  None  Procedures: None  Antimicrobials:   Anti-infectives (From admission, onward)    Start     Dose/Rate Route  Frequency Ordered Stop   03/02/21 1400  cefTRIAXone (ROCEPHIN) 1 g in sodium chloride 0.9 % 100 mL IVPB        1 g 200 mL/hr over 30 Minutes Intravenous Every 24 hours 03/02/21 1334        Subjective: Patient was seen and examined at bedside.  Overnight events noted.  Patient is very hard of hearing.   Patient reports feeling improved . Her potassium has improved. She complains of burning urination, UA consistent with UTI, started on antibiotics.   Objective: Vitals:   03/01/21 2140 03/02/21 0500 03/02/21 0536 03/02/21 1213  BP: 124/66  (!) 145/78 (!) 141/77  Pulse: 72  69 69  Resp: 18  17 18   Temp: 97.9 F (36.6 C)  (!) 97.5 F (36.4 C) 97.8 F (36.6 C)  TempSrc: Oral  Oral Oral  SpO2: 95%  98% 96%  Weight:  79.5 kg    Height:        Intake/Output Summary (Last 24 hours) at 03/02/2021 1545 Last data filed at 03/02/2021 1221 Gross per 24 hour  Intake 60 ml  Output 177 ml  Net -117 ml   Filed Weights   02/28/21 0500 03/01/21 0500 03/02/21 0500  Weight: 78.4 kg 80 kg 79.5 kg    Examination:  General exam: Appears calm and comfortable, not in any acute distress. Respiratory system: Clear to auscultation. Respiratory effort normal. Cardiovascular system: S1 & S2 heard, RRR. No JVD, murmurs, rubs, gallops or clicks. No pedal edema. Gastrointestinal system: Abdomen is nondistended, soft and nontender. No organomegaly or masses felt. Normal bowel sounds heard. Central nervous system: Alert and oriented. No focal neurological deficits. Extremities: No edema, no cyanosis, no clubbing. Skin: No rashes, lesions or ulcers Psychiatry: Judgement and insight appear normal. Mood & affect appropriate.     Data Reviewed: I have personally reviewed following labs and imaging studies  CBC: Recent Labs  Lab 02/26/21 1933 02/27/21 0340 02/28/21 0424  WBC 11.6* 11.3* 10.2  NEUTROABS 8.1*  --   --   HGB 16.7* 16.8* 14.2  HCT 50.1* 52.7* 44.2  MCV 96.7 99.1 99.8  PLT 528* 472*  484*   Basic Metabolic Panel: Recent Labs  Lab 02/26/21 1933 02/27/21 0340 02/28/21 0424  NA 141 138 137  K 2.5* 3.6 3.9  CL 108 104 105  CO2 25 23 27   GLUCOSE 114* 103* 96  BUN 12 14 15   CREATININE 0.83 0.82 0.84  CALCIUM 7.9* 8.9 9.0  MG 1.8 2.0 2.2  PHOS  --   --  3.8   GFR: Estimated Creatinine Clearance: 46.3 mL/min (by C-G formula based on SCr of 0.84 mg/dL). Liver Function Tests: Recent Labs  Lab 02/27/21 0340  AST 24  ALT 21  ALKPHOS 74  BILITOT 0.6  PROT 6.6  ALBUMIN 3.5   No results for input(s): LIPASE, AMYLASE in the last 168 hours. No results for input(s): AMMONIA in the last 168 hours. Coagulation Profile: No results for input(s): INR, PROTIME in the last 168 hours. Cardiac Enzymes: No results for input(s): CKTOTAL, CKMB, CKMBINDEX, TROPONINI in the last 168 hours. BNP (last 3 results) No results for input(s): PROBNP in the last 8760 hours. HbA1C: No results for input(s): HGBA1C in the last 72 hours. CBG: Recent  Labs  Lab 03/01/21 1249 03/01/21 1650 03/01/21 2135 03/02/21 0728 03/02/21 1138  GLUCAP 155* 76 88 103* 105*   Lipid Profile: No results for input(s): CHOL, HDL, LDLCALC, TRIG, CHOLHDL, LDLDIRECT in the last 72 hours. Thyroid Function Tests: No results for input(s): TSH, T4TOTAL, FREET4, T3FREE, THYROIDAB in the last 72 hours. Anemia Panel: No results for input(s): VITAMINB12, FOLATE, FERRITIN, TIBC, IRON, RETICCTPCT in the last 72 hours. Sepsis Labs: No results for input(s): PROCALCITON, LATICACIDVEN in the last 168 hours.  Recent Results (from the past 240 hour(s))  SARS CORONAVIRUS 2 (TAT 6-24 HRS) Nasopharyngeal Nasopharyngeal Swab     Status: None   Collection Time: 02/26/21 11:17 PM   Specimen: Nasopharyngeal Swab  Result Value Ref Range Status   SARS Coronavirus 2 NEGATIVE NEGATIVE Final    Comment: (NOTE) SARS-CoV-2 target nucleic acids are NOT DETECTED.  The SARS-CoV-2 RNA is generally detectable in upper and  lower respiratory specimens during the acute phase of infection. Negative results do not preclude SARS-CoV-2 infection, do not rule out co-infections with other pathogens, and should not be used as the sole basis for treatment or other patient management decisions. Negative results must be combined with clinical observations, patient history, and epidemiological information. The expected result is Negative.  Fact Sheet for Patients: HairSlick.no  Fact Sheet for Healthcare Providers: quierodirigir.com  This test is not yet approved or cleared by the [place] FDA and  has been authorized for detection and/or diagnosis of SARS-CoV-2 by FDA under an Emergency Use Authorization (EUA). This EUA will remain  in effect (meaning this test can be used) for the duration of the COVID-19 declaration under Se ction 564(b)(1) of the Act, 21 U.S.C. section 360bbb-3(b)(1), unless the authorization is terminated or revoked sooner.  Performed at [facility], [address], [place], [place] [address]     Radiology Studies: No results found.  Scheduled Meds:  aspirin  81 mg Oral Daily   buPROPion  150 mg Oral BID   carvedilol  3.125 mg Oral BID WC   enoxaparin (LOVENOX) injection  40 mg Subcutaneous Q24H   insulin aspart  0-9 Units Subcutaneous TID WC   levothyroxine  112 mcg Oral Q0600   potassium chloride SA  20 mEq Oral Daily   simvastatin  40 mg Oral q1800   sodium chloride flush  3 mL Intravenous Q12H   Continuous Infusions:  cefTRIAXone (ROCEPHIN)  IV 1 g (03/02/21 1500)     LOS: 1 day    Time spent: 25 MINS    Sekai Gitlin, MD [facility]   If 7PM-7AM, please contact night-coverage

## 2021-03-02 NOTE — Progress Notes (Signed)
PT Cancellation Note  Patient Details Name: Sue [name] MRN: [id number] DOB: [birth year]   Cancelled Treatment:    Reason Eval/Treat Not Completed: Patient declined, no reason specified    Faye Ramsay, PT Acute Rehabilitation  Office: [phone] Pager: [phone]

## 2021-03-03 ENCOUNTER — Encounter (HOSPITAL_COMMUNITY): Payer: Self-pay | Admitting: Internal Medicine

## 2021-03-03 ENCOUNTER — Encounter: Payer: HMO | Admitting: Registered Nurse

## 2021-03-03 LAB — BASIC METABOLIC PANEL
Anion gap: 5 (ref 5–15)
BUN: 12 mg/dL (ref 8–23)
CO2: 28 mmol/L (ref 22–32)
Calcium: 9.2 mg/dL (ref 8.9–10.3)
Chloride: 105 mmol/L (ref 98–111)
Creatinine, Ser: 0.87 mg/dL (ref 0.44–1.00)
GFR, Estimated: >60 mL/min (ref >60)
Glucose, Bld: 104 mg/dL — ABNORMAL HIGH (ref 70–99)
Potassium: 3.9 mmol/L (ref 3.5–5.1)
Sodium: 138 mmol/L (ref 135–145)

## 2021-03-03 LAB — CBC
HCT: 48.3 % — ABNORMAL HIGH (ref 36.0–46.0)
Hemoglobin: 15.6 g/dL — ABNORMAL HIGH (ref 12.0–15.0)
MCH: 31.8 pg (ref 26.0–34.0)
MCHC: 32.3 g/dL (ref 30.0–36.0)
MCV: 98.4 fL (ref 80.0–100.0)
Platelets: 443 10*3/uL — ABNORMAL HIGH (ref 150–400)
RBC: 4.91 MIL/uL (ref 3.87–5.11)
RDW: 15.1 % (ref 11.5–15.5)
WBC: 9.4 10*3/uL (ref 4.0–10.5)
nRBC: 0 % (ref 0.0–0.2)

## 2021-03-03 LAB — GLUCOSE, CAPILLARY
Glucose-Capillary: 106 mg/dL — ABNORMAL HIGH (ref 70–99)
Glucose-Capillary: 153 mg/dL — ABNORMAL HIGH (ref 70–99)
Glucose-Capillary: 90 mg/dL (ref 70–99)
Glucose-Capillary: 129 mg/dL — ABNORMAL HIGH (ref 70–99)

## 2021-03-03 LAB — PHOSPHORUS: Phosphorus: 4.1 mg/dL (ref 2.5–4.6)

## 2021-03-03 LAB — MAGNESIUM: Magnesium: 2.2 mg/dL (ref 1.7–2.4)

## 2021-03-03 NOTE — Progress Notes (Signed)
Occupational Therapy Treatment Patient Details Name: Sue [name] MRN: [id number] DOB: [birth year] Today's Date: 03/03/2021    History of present illness patient is a 87 year old female who presented to the hosptial on 02/26/21 with weakness. Patient's medical history significant for chronic diastolic CHF, T2DM, HTN, HLD, hypothyroidism, GERD, depression, hard of hearing, and recent admission for acute CVA. patient was found to have hypokalemia and weakness.   OT comments  Patient was noted to have new diagnosis of UTI. Patient upon initial approach reported that she was not going to get out of bed. Patient agreed upon a time for session later this AM with nursing encouragement. Patient was approached at agreed upon time with patient agreeable to participate. Patient was min guard for supine to sit on edge of bed with use of bed rails. Patient was min A to transfer off edge of bed with rolling walker to commode in bathroom with noted lifting toes on L foot off floor while walking. Patient was unable to correct with multimodal education on this date. Patient improved in ability to complete hygiene tasks seated for toileting on this date with set up. Patient also improved on sit to stand from commode requiring min A to complete with education to keep both hands on walker for complete turns. Patient was able to stand at sink with min guard to complete grooming tasks and UB washing. Patient agreed to sit up for lunch at end of session.Patient d/c remains for patient to transition home with [facility] and [facility].   Follow Up Recommendations  Home health OT    Equipment Recommendations          Precautions / Restrictions Precautions Precautions: Fall   Mobility Bed Mobility Overal bed mobility: Needs Assistance Bed Mobility: Sit to Supine     Supine to sit: Min guard Sit to supine: Min assist;Min guard    Transfers Overall transfer level: Needs assistance Equipment used: Rolling walker (2  wheeled) Transfers: Sit to/from Stand Sit to Stand: Min assist Stand pivot transfers: Min assist       General transfer comment: with education for directional cues.        ADL either performed or assessed with clinical judgement   ADL Overall ADL's : Needs assistance/impaired Eating/Feeding: Set up   Grooming: Oral care;Wash/dry face;Min guard Grooming Details (indicate cue type and reason): patient was noted to lean on counter for support while standing. Upper Body Bathing: Minimal assistance;Sitting               Toilet Transfer: Moderate assistance;Stand-pivot;Grab bars;Ambulation Toilet Transfer Details (indicate cue type and reason): with rolling walker. Toileting- Clothing Manipulation and Hygiene: Sitting/lateral lean;Min guard Toileting - Clothing Manipulation Details (indicate cue type and reason): patient was able to complete hygiene while seated with set up.     Functional mobility during ADLs: Min guard General ADL Comments: with rolling walker. patient was noted to keep toes on L foot off floor during ambulation tasks.       Pertinent Vitals/ Pain       Pain Assessment: No/denies pain   Frequency  Min 2X/week        Progress Toward Goals  OT Goals(current goals can now be found in the care plan section)     Acute Rehab OT Goals Patient Stated Goal: patient wants to go home. OT Goal Formulation: With patient Time For Goal Achievement: 03/13/21 Potential to Achieve Goals: Fair  Plan Discharge plan remains appropriate    AM-PAC OT "  6 Clicks" Daily Activity     Outcome Measure   Help from another person eating meals?: None Help from another person taking care of personal grooming?: A Little Help from another person toileting, which includes using toliet, bedpan, or urinal?: A Little Help from another person bathing (including washing, rinsing, drying)?: A Little Help from another person to put on and taking off regular upper body  clothing?: A Little Help from another person to put on and taking off regular lower body clothing?: A Lot 6 Click Score: 18    End of Session Equipment Utilized During Treatment: Gait belt;Rolling walker  OT Visit Diagnosis: Muscle weakness (generalized) (M62.81)   Activity Tolerance Patient tolerated treatment well   Patient Left in chair;with call bell/phone within reach;with chair alarm set   Nurse Communication Other (comment) (nursing consulted when patient intially refused to get out of bed. nursing educated patient was in chair at end of session.)        Time: 1101-1121 OT Time Calculation (min): 20 min  Charges: OT General Charges $OT Visit: 1 Visit OT Treatments $Self Care/Home Management : 8-22 mins  Sharyn Blitz OTR/L, MS Acute Rehabilitation Department Office# [phone] Pager# [phone]    Chalmers Guest Justun Anaya 03/03/2021, 12:19 PM

## 2021-03-03 NOTE — Plan of Care (Signed)

## 2021-03-03 NOTE — Progress Notes (Signed)
PROGRESS NOTE    Sue [name]  VPX:[id number] DOB: [birth year] DOA: 02/26/2021 PCP: Sigmund Hazel, MD    Brief Narrative:  This 87 years old female with PMH significant for chronic diastolic CHF, type 2 diabetes, hypertension, hyperlipidemia, hypothyroidism, GERD, depression, hard of hearing and recent admission for acute CVA presented in the ED with generalized weakness. Patient was recently admitted from 01/25/2021 - 02/01/2021 after developing sudden onset of right-sided weakness.She was found to have suspected cardioembolic acute and subacute infarcts of the bilateral basal ganglia and corona radiata as well as right occipitoparietal lobes.  She received tPA.  She was started on DAPT with aspirin/Plavix for 3 weeks, now on aspirin alone. Patient was discharged to CIR where she remained until discharge to home on 02/23/21. She has developed acute urinary retention requiring Foley catheter and UTI growing E. coli and Citrobacter,  she was treated with Macrobid twice in rehab.  Patient was admitted for generalized weakness, She was found to have hypokalemia ( potassium 2.5). Which was replaced and corrected. Patient was also continued on IV hydration.  She feels much improved.  PT and OT recommended home PT/OT.  Home health services been arranged.  Patient was discharged 6/26 . She c/o: burning urination, UA consistent with UTI,  started on ceftriaxone,  urine culture sent,  discharge held until cultures back.  Assessment & Plan:   Principal Problem:   Hypokalemia Active Problems:   Chronic diastolic CHF (congestive heart failure) (HCC)   Hypertension associated with diabetes (HCC)   Hyperlipidemia associated with type 2 diabetes mellitus (HCC)   Hypothyroidism   Diabetes mellitus type 2 in obese [facility])   Urinary retention   Generalized weakness  UTI: UA : Leukocytes large, nitrite positive. Started on ceftriaxone, follow-up urine culture.  Hypokalemia: > Improved Patient presented with  generalized weakness, found to have potassium 2.3, Likely sec. to Lasix.  Denies any GI losses.   Replacement completed, potassium has improved. Resume Lasix.  Generalized weakness with falls at home: In setting of recent prolonged admission after acute CVA and hypokalemia.   As reported PT/OT/SLP set up for home but has not begun yet. PT/ OT recommended home PT and OT.  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 Continue aspirin and statin Follow-up with neurology outpatient.   Urinary retention : Resolved. She has developed urinary retention while in inpatient rehab.   Foley catheter remained in place.  Recently treated for Citrobacter and E. coli UTI with Macrobid.   Urinalysis on admission likely colonized without acute infection. Foley catheter removed, she voided . Discontinue sertraline.   Chronic diastolic CHF: Stable without acute decompensation.   Lasix resumed. Monitor strict I/O's.   Type 2 diabetes: A1c 6.9%.  Not on medical management as an outpatient.   Continue Regular insulin sliding scale while in hospital.  Hypertension: Continue Coreg.   Hypothyroidism: Continue Synthroid.   Hyperlipidemia: Continue simvastatin.   Depression: Continue bupropion.   DVT prophylaxis: Lovenox Code Status: Full code. Family Communication: No family at bed side. Disposition Plan:    Status is: Inpatient  Remains inpatient appropriate because:Inpatient level of care appropriate due to severity of illness  Dispo: The patient is from: Home              Anticipated d/c is to: Home              Patient currently is  not medically stable to d/c.   Difficult to place patient No   Consultants:  None  Procedures: None  Antimicrobials:   Anti-infectives (From admission, onward)    Start     Dose/Rate  Route Frequency Ordered Stop   03/02/21 1400  cefTRIAXone (ROCEPHIN) 1 g in sodium chloride 0.9 % 100 mL IVPB        1 g 200 mL/hr over 30 Minutes Intravenous Every 24 hours 03/02/21 1334        Subjective: Patient was seen and examined at bedside.  Overnight events noted.  Patient is very hard of hearing.   Patient reports feeling improved . She has participated in physical therapy. She still complains of burning urination, UA consistent with UTI, started on antibiotics.   Objective: Vitals:   03/02/21 1958 03/03/21 0500 03/03/21 0502 03/03/21 1221  BP: 137/83  (!) 138/93 (!) 146/86  Pulse: 81  71 86  Resp: 18  18 18   Temp: 97.7 F (36.5 C)  97.9 F (36.6 C)   TempSrc: Oral  Oral   SpO2: 96%  95% 95%  Weight:  79.7 kg    Height:        Intake/Output Summary (Last 24 hours) at 03/03/2021 1451 Last data filed at 03/03/2021 1231 Gross per 24 hour  Intake 220 ml  Output 200 ml  Net 20 ml   Filed Weights   03/01/21 0500 03/02/21 0500 03/03/21 0500  Weight: 80 kg 79.5 kg 79.7 kg    Examination:  General exam: Appears calm and comfortable, not in any acute distress. Respiratory system: Clear to auscultation. Respiratory effort normal. Cardiovascular system: S1 & S2 heard, RRR. No JVD, murmurs, rubs, gallops or clicks. No pedal edema. Gastrointestinal system: Abdomen is nondistended, soft and nontender. No organomegaly or masses felt. Normal bowel sounds heard. Central nervous system: Alert and oriented. No focal neurological deficits. Extremities: No edema, no cyanosis, no clubbing. Skin: No rashes, lesions or ulcers Psychiatry: Judgement and insight appear normal. Mood & affect appropriate.     Data Reviewed: I have personally reviewed following labs and imaging studies  CBC: Recent Labs  Lab 02/26/21 1933 02/27/21 0340 02/28/21 0424 03/03/21 0321  WBC 11.6* 11.3* 10.2 9.4  NEUTROABS 8.1*  --   --   --   HGB 16.7* 16.8* 14.2 15.6*  HCT 50.1* 52.7* 44.2 48.3*   MCV 96.7 99.1 99.8 98.4  PLT 528* 472* 484* 443*   Basic Metabolic Panel: Recent Labs  Lab 02/26/21 1933 02/27/21 0340 02/28/21 0424 03/03/21 0321  NA 141 138 137 138  K 2.5* 3.6 3.9 3.9  CL 108 104 105 105  CO2 25 23 27 28   GLUCOSE 114* 103* 96 104*  BUN 12 14 15 12   CREATININE 0.83 0.82 0.84 0.87  CALCIUM 7.9* 8.9 9.0 9.2  MG 1.8 2.0 2.2 2.2  PHOS  --   --  3.8 4.1   GFR: Estimated Creatinine Clearance: 44.8 mL/min (by C-G formula based on SCr of 0.87 mg/dL). Liver Function Tests: Recent Labs  Lab 02/27/21 0340  AST 24  ALT 21  ALKPHOS 74  BILITOT 0.6  PROT 6.6  ALBUMIN 3.5   No results for input(s): LIPASE, AMYLASE in the last 168 hours. No results for input(s): AMMONIA in the last 168 hours. Coagulation Profile: No results for input(s): INR, PROTIME in the last 168 hours. Cardiac Enzymes: No results for input(s): CKTOTAL, CKMB, CKMBINDEX, TROPONINI in the last 168 hours. BNP (last 3 results) No results  for input(s): PROBNP in the last 8760 hours. HbA1C: No results for input(s): HGBA1C in the last 72 hours. CBG: Recent Labs  Lab 03/02/21 1138 03/02/21 1710 03/02/21 2002 03/03/21 0809 03/03/21 1200  GLUCAP 105* 99 134* 106* 153*   Lipid Profile: No results for input(s): CHOL, HDL, LDLCALC, TRIG, CHOLHDL, LDLDIRECT in the last 72 hours. Thyroid Function Tests: No results for input(s): TSH, T4TOTAL, FREET4, T3FREE, THYROIDAB in the last 72 hours. Anemia Panel: No results for input(s): VITAMINB12, FOLATE, FERRITIN, TIBC, IRON, RETICCTPCT in the last 72 hours. Sepsis Labs: No results for input(s): PROCALCITON, LATICACIDVEN in the last 168 hours.  Recent Results (from the past 240 hour(s))  SARS CORONAVIRUS 2 (TAT 6-24 HRS) Nasopharyngeal Nasopharyngeal Swab     Status: None   Collection Time: 02/26/21 11:17 PM   Specimen: Nasopharyngeal Swab  Result Value Ref Range Status   SARS Coronavirus 2 NEGATIVE NEGATIVE Final    Comment: (NOTE) SARS-CoV-2  target nucleic acids are NOT DETECTED.  The SARS-CoV-2 RNA is generally detectable in upper and lower respiratory specimens during the acute phase of infection. Negative results do not preclude SARS-CoV-2 infection, do not rule out co-infections with other pathogens, and should not be used as the sole basis for treatment or other patient management decisions. Negative results must be combined with clinical observations, patient history, and epidemiological information. The expected result is Negative.  Fact Sheet for Patients: HairSlick.no  Fact Sheet for Healthcare Providers: quierodirigir.com  This test is not yet approved or cleared by the [place] FDA and  has been authorized for detection and/or diagnosis of SARS-CoV-2 by FDA under an Emergency Use Authorization (EUA). This EUA will remain  in effect (meaning this test can be used) for the duration of the COVID-19 declaration under Se ction 564(b)(1) of the Act, 21 U.S.C. section 360bbb-3(b)(1), unless the authorization is terminated or revoked sooner.  Performed at [facility], [address], [place], [place] [address]     Radiology Studies: No results found.  Scheduled Meds:  aspirin  81 mg Oral Daily   buPROPion  150 mg Oral BID   carvedilol  3.125 mg Oral BID WC   enoxaparin (LOVENOX) injection  40 mg Subcutaneous Q24H   insulin aspart  0-9 Units Subcutaneous TID WC   levothyroxine  112 mcg Oral Q0600   potassium chloride SA  20 mEq Oral Daily   simvastatin  40 mg Oral q1800   sodium chloride flush  3 mL Intravenous Q12H   Continuous Infusions:  cefTRIAXone (ROCEPHIN)  IV 1 g (03/03/21 1232)     LOS: 2 days    Time spent: 25 MINS    Tiyanna Larcom, MD [facility]   If 7PM-7AM, please contact night-coverage

## 2021-03-04 LAB — GLUCOSE, CAPILLARY
Glucose-Capillary: 95 mg/dL (ref 70–99)
Glucose-Capillary: 136 mg/dL — ABNORMAL HIGH (ref 70–99)
Glucose-Capillary: 119 mg/dL — ABNORMAL HIGH (ref 70–99)
Glucose-Capillary: 99 mg/dL (ref 70–99)

## 2021-03-04 MED ORDER — CEPHALEXIN 500 MG PO CAPS
500.0000 mg | ORAL_CAPSULE | Freq: Four times a day (QID) | ORAL | Status: DC
  Administered 2021-03-04 – 2021-03-06 (×8): 500 mg via ORAL
  Filled 2021-03-04 (×8): qty 1

## 2021-03-04 NOTE — Plan of Care (Signed)
  Problem: Education: Goal: Knowledge of General Education information will improve Description: Including pain rating scale, medication(s)/side effects and non-pharmacologic comfort measures Outcome: Progressing   Problem: Health Behavior/Discharge Planning: Goal: Ability to manage health-related needs will improve 03/04/2021 1523 by Charmian Muff, RN Outcome: Progressing 03/04/2021 1522 by Charmian Muff, RN Outcome: Progressing   Problem: Clinical Measurements: Goal: Ability to maintain clinical measurements within normal limits will improve Outcome: Progressing Goal: Will remain free from infection Outcome: Progressing

## 2021-03-04 NOTE — Progress Notes (Signed)
Physical Therapy Treatment Patient Details Name: Sue [name] MRN: [id number] DOB: 08-24-1930 Today's Date: 03/04/2021    History of Present Illness patient is a 87 year old female who presented to the hosptial on 02/26/21 with weakness. Patient's medical history significant for chronic diastolic CHF, T2DM, HTN, HLD, hypothyroidism, GERD, depression, hard of hearing, and recent admission for acute CVA. patient was found to have hypokalemia and weakness.    PT Comments    The patient is more confused and  requiring increased assistance for ambulating to BR and in room using RW. Multimodal cues for safety. Patient will require close 24/7 assistance as patient is high fall risk.  Follow Up Recommendations  Supervision/Assistance - 24 hour;Home health PT     Equipment Recommendations  None recommended by PT    Recommendations for Other Services       Precautions / Restrictions Precautions Precautions: Fall    Mobility  Bed Mobility               General bed mobility comments: in recliner    Transfers Overall transfer level: Needs assistance Equipment used: Rolling walker (2 wheeled) Transfers: Sit to/from Stand Sit to Stand: Mod assist         General transfer comment: mod assist to  stand from recliner and toilet, use of rail  Ambulation/Gait Ambulation/Gait assistance: Mod assist Gait Distance (Feet): 20 Feet (x 2) Assistive device: Rolling walker (2 wheeled) Gait Pattern/deviations: Step-to pattern Gait velocity: decreased   General Gait Details: multimodal cues  for position inside RW and assistance to turn   and  avoid  objects,RW is out in front too far.   Stairs             Wheelchair Mobility    Modified Rankin (Stroke Patients Only)       Balance Overall balance assessment: Needs assistance Sitting-balance support: Single extremity supported Sitting balance-Leahy Scale: Fair     Standing balance support: Bilateral upper extremity  supported Standing balance-Leahy Scale: Poor Standing balance comment: reliant on UEs                            Cognition Arousal/Alertness: Awake/alert Behavior During Therapy: WFL for tasks assessed/performed Overall Cognitive Status: No family/caregiver present to determine baseline cognitive functioning                                 General Comments: patient is  sundowning and more confused as session  proceeded.      Exercises      General Comments        Pertinent Vitals/Pain Faces Pain Scale: No hurt    Home Living                      Prior Function            PT Goals (current goals can now be found in the care plan section) Progress towards PT goals: Progressing toward goals    Frequency    Min 3X/week      PT Plan Current plan remains appropriate    Co-evaluation              AM-PAC PT "6 Clicks" Mobility   Outcome Measure  Help needed turning from your back to your side while in a flat bed without using bedrails?: A Lot Help needed moving from  lying on your back to sitting on the side of a flat bed without using bedrails?: A Lot Help needed moving to and from a bed to a chair (including a wheelchair)?: A Lot Help needed standing up from a chair using your arms (e.g., wheelchair or bedside chair)?: A Lot Help needed to walk in hospital room?: A Lot Help needed climbing 3-5 steps with a railing? : Total 6 Click Score: 11    End of Session Equipment Utilized During Treatment: Gait belt Activity Tolerance: Patient tolerated treatment well Patient left: with call bell/phone within reach;in bed;with bed alarm set Nurse Communication: Mobility status PT Visit Diagnosis: Other abnormalities of gait and mobility (R26.89)     Time: 7124-5809 PT Time Calculation (min) (ACUTE ONLY): 13 min  Charges:  $Gait Training: 8-22 mins                     Blanchard Kelch PT Acute Rehabilitation Services Pager  [phone] Office [phone]    [name] 03/04/2021, 5:10 PM

## 2021-03-04 NOTE — Plan of Care (Signed)
  Problem: Education: Goal: Knowledge of General Education information will improve Description Including pain rating scale, medication(s)/side effects and non-pharmacologic comfort measures Outcome: Progressing   Problem: Health Behavior/Discharge Planning: Goal: Ability to manage health-related needs will improve Outcome: Progressing   

## 2021-03-04 NOTE — Care Management Important Message (Signed)
Important Message  Patient Details IM Letter given to the Patient. Name: Sue [name] MRN: [id number] Date of Birth: Jun 27, 1930   Medicare Important Message Given:  Yes     Caren Macadam 03/04/2021, 11:00 AM

## 2021-03-04 NOTE — Progress Notes (Signed)
PROGRESS NOTE    Sue [name]  DGU:[id number] DOB: April 22, 1930 DOA: 02/26/2021 PCP: Sigmund Hazel, MD    Brief Narrative:  This 87 years old female with PMH significant for chronic diastolic CHF, type 2 diabetes, hypertension, hyperlipidemia, hypothyroidism, GERD, depression, hard of hearing and recent admission for acute CVA presented in the ED with generalized weakness. Patient was recently admitted from 01/25/2021 - 02/01/2021 after developing sudden onset of right-sided weakness.She was found to have suspected cardioembolic acute and subacute infarcts of the bilateral basal ganglia and corona radiata as well as right occipitoparietal lobes.  She received tPA.  She was started on DAPT with aspirin/Plavix for 3 weeks, now on aspirin alone. Patient was discharged to CIR where she remained until discharge to home on 02/23/21. She has developed acute urinary retention requiring Foley catheter and UTI growing E. coli and Citrobacter,  she was treated with Macrobid twice in rehab.  Patient was admitted for generalized weakness, She was found to have hypokalemia ( potassium 2.5). Which was replaced and corrected. Patient was also continued on IV hydration.  She feels much improved.  PT and OT recommended home PT/OT.  Home health services been arranged.  Patient was discharged 6/26 . She c/o: burning urination, UA consistent with UTI,  started on ceftriaxone,  urine culture positive for E. coli, pending susceptibility, recently had pansensitive E. coli UTI. Will need 2 weeks of antibiotics for frequently reoccurring UTI, transition to Keflex, medically stable for discharge but unable to reach family, per patient they are out of town??  Assessment & Plan:   Principal Problem:   Hypokalemia Active Problems:   Chronic diastolic CHF (congestive heart failure) (HCC)   Hypertension associated with diabetes (HCC)   Hyperlipidemia associated with type 2 diabetes mellitus (HCC)   Hypothyroidism   Diabetes  mellitus type 2 in obese [facility])   Urinary retention   Generalized weakness  UTI: UA : Leukocytes large, nitrite positive. Urine culture with E. coli-pending susceptibility, recent urine cultures done on 02/16/2021 with pansensitive E. Coli.  Remained afebrile. -Stop ceftriaxone and start her on Keflex-we will do 2 weeks of antibiotics.  Hypokalemia: Resolved. Patient presented with generalized weakness, found to have potassium 2.3, Likely sec. to Lasix.  Denies any GI losses.   Resume Lasix.  Generalized weakness with falls at home: In setting of recent prolonged admission after acute CVA and hypokalemia.   As reported PT/OT/SLP set up for home but has not begun yet. PT/ OT recommended home PT and OT.  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 Continue aspirin and statin Follow-up with neurology outpatient.   Urinary retention : Resolved. She has developed urinary retention while in inpatient rehab.   Foley catheter remained in place.  Recently treated for Citrobacter and E. coli UTI with Macrobid.   Urinalysis on admission likely colonized without acute infection. Foley catheter removed, she voided . Discontinue sertraline.   Chronic diastolic CHF: Stable without acute decompensation.   Lasix resumed. Monitor strict I/O's.   Type 2 diabetes: A1c 6.9%.  Not on medical management as an outpatient.   Continue Regular insulin sliding scale while in hospital.  Hypertension: Continue Coreg.   Hypothyroidism: Continue Synthroid.   Hyperlipidemia: Continue simvastatin.   Depression: Continue bupropion.   DVT prophylaxis: Lovenox Code Status: Full code. Family Communication: Unable to reach any family member on phone.. Disposition Plan:    Status  is: Inpatient  Remains inpatient appropriate  because:Inpatient level of care appropriate due to severity of illness  Dispo: The patient is from: Home              Anticipated d/c is to: Home              Patient currently is medically stable for discharge, unsafe discharge as family cannot be reached at this time.   Difficult to place patient No   Consultants:  None  Procedures: None  Antimicrobials:   Anti-infectives (From admission, onward)    Start     Dose/Rate Route Frequency Ordered Stop   03/04/21 1400  cephALEXin (KEFLEX) capsule 500 mg        500 mg Oral 4 times daily 03/04/21 1142     03/02/21 1400  cefTRIAXone (ROCEPHIN) 1 g in sodium chloride 0.9 % 100 mL IVPB  Status:  Discontinued        1 g 200 mL/hr over 30 Minutes Intravenous Every 24 hours 03/02/21 1334 03/04/21 1142      Subjective: Patient was seen and examined today.  No new complaint.  She is very hard of hearing and difficult to communicate with.  Per patient she lives with son who is currently out of town and there is no one at home so she cannot go.  Objective: Vitals:   03/03/21 1221 03/03/21 2010 03/04/21 0442 03/04/21 1356  BP: (!) 146/86 (!) 147/89 (!) 153/85 119/73  Pulse: 86 77 75 80  Resp: 18 20 18 20   Temp:  97.8 F (36.6 C) 97.7 F (36.5 C) (!) 97.5 F (36.4 C)  TempSrc:  Oral  Oral  SpO2: 95% 93% 91% 98%  Weight:      Height:        Intake/Output Summary (Last 24 hours) at 03/04/2021 1538 Last data filed at 03/04/2021 1300 Gross per 24 hour  Intake 60 ml  Output --  Net 60 ml    Filed Weights   03/01/21 0500 03/02/21 0500 03/03/21 0500  Weight: 80 kg 79.5 kg 79.7 kg    Examination:  General.  Well-developed elderly lady, in no acute distress. Pulmonary.  Lungs clear bilaterally, normal respiratory effort. CV.  Regular rate and rhythm, no JVD, rub or murmur. Abdomen.  Soft, nontender, nondistended, BS positive. CNS.  Alert and oriented .  No focal neurologic deficit. Extremities.  No edema, no cyanosis, pulses  intact and symmetrical. Psychiatry.  Judgment and insight appears normal.    Data Reviewed: I have personally reviewed following labs and imaging studies  CBC: Recent Labs  Lab 02/26/21 1933 02/27/21 0340 02/28/21 0424 03/03/21 0321  WBC 11.6* 11.3* 10.2 9.4  NEUTROABS 8.1*  --   --   --   HGB 16.7* 16.8* 14.2 15.6*  HCT 50.1* 52.7* 44.2 48.3*  MCV 96.7 99.1 99.8 98.4  PLT 528* 472* 484* 443*    Basic Metabolic Panel: Recent Labs  Lab 02/26/21 1933 02/27/21 0340 02/28/21 0424 03/03/21 0321  NA 141 138 137 138  K 2.5* 3.6 3.9 3.9  CL 108 104 105 105  CO2 25 23 27 28   GLUCOSE 114* 103* 96 104*  BUN 12 14 15 12   CREATININE 0.83 0.82 0.84 0.87  CALCIUM 7.9* 8.9 9.0 9.2  MG 1.8 2.0 2.2 2.2  PHOS  --   --  3.8 4.1    GFR: Estimated Creatinine Clearance: 44.8 mL/min (by C-G formula based on SCr of 0.87 mg/dL). Liver Function Tests: Recent  Labs  Lab 02/27/21 0340  AST 24  ALT 21  ALKPHOS 74  BILITOT 0.6  PROT 6.6  ALBUMIN 3.5    No results for input(s): LIPASE, AMYLASE in the last 168 hours. No results for input(s): AMMONIA in the last 168 hours. Coagulation Profile: No results for input(s): INR, PROTIME in the last 168 hours. Cardiac Enzymes: No results for input(s): CKTOTAL, CKMB, CKMBINDEX, TROPONINI in the last 168 hours. BNP (last 3 results) No results for input(s): PROBNP in the last 8760 hours. HbA1C: No results for input(s): HGBA1C in the last 72 hours. CBG: Recent Labs  Lab 03/03/21 1200 03/03/21 1626 03/03/21 2013 03/04/21 0711 03/04/21 1137  GLUCAP 153* 90 129* 95 136*    Lipid Profile: No results for input(s): CHOL, HDL, LDLCALC, TRIG, CHOLHDL, LDLDIRECT in the last 72 hours. Thyroid Function Tests: No results for input(s): TSH, T4TOTAL, FREET4, T3FREE, THYROIDAB in the last 72 hours. Anemia Panel: No results for input(s): VITAMINB12, FOLATE, FERRITIN, TIBC, IRON, RETICCTPCT in the last 72 hours. Sepsis Labs: No results for  input(s): PROCALCITON, LATICACIDVEN in the last 168 hours.  Recent Results (from the past 240 hour(s))  SARS CORONAVIRUS 2 (TAT 6-24 HRS) Nasopharyngeal Nasopharyngeal Swab     Status: None   Collection Time: 02/26/21 11:17 PM   Specimen: Nasopharyngeal Swab  Result Value Ref Range Status   SARS Coronavirus 2 NEGATIVE NEGATIVE Final    Comment: (NOTE) SARS-CoV-2 target nucleic acids are NOT DETECTED.  The SARS-CoV-2 RNA is generally detectable in upper and lower respiratory specimens during the acute phase of infection. Negative results do not preclude SARS-CoV-2 infection, do not rule out co-infections with other pathogens, and should not be used as the sole basis for treatment or other patient management decisions. Negative results must be combined with clinical observations, patient history, and epidemiological information. The expected result is Negative.  Fact Sheet for Patients: HairSlick.no  Fact Sheet for Healthcare Providers: quierodirigir.com  This test is not yet approved or cleared by the [place] FDA and  has been authorized for detection and/or diagnosis of SARS-CoV-2 by FDA under an Emergency Use Authorization (EUA). This EUA will remain  in effect (meaning this test can be used) for the duration of the COVID-19 declaration under Se ction 564(b)(1) of the Act, 21 U.S.C. section 360bbb-3(b)(1), unless the authorization is terminated or revoked sooner.  Performed at [facility], [address], [place], [place] [address]   Culture, Urine     Status: Abnormal (Preliminary result)   Collection Time: 03/02/21 12:46 PM   Specimen: Urine, Clean Catch  Result Value Ref Range Status   Specimen Description   Final    URINE, CLEAN CATCH Performed at [facility], [address] [place], [place] [address]    Special Requests   Final    NONE Performed at [facility], [address], [place], [place] [address]    Culture >=100,000 COLONIES/mL ESCHERICHIA COLI (A)  Final   Report Status PENDING  Incomplete     Radiology Studies: No results found.  Scheduled Meds:  aspirin  81 mg Oral Daily   buPROPion  150 mg Oral BID   carvedilol  3.125 mg Oral BID WC   cephALEXin  500 mg Oral QID   enoxaparin (LOVENOX) injection  40 mg Subcutaneous Q24H   insulin aspart  0-9 Units Subcutaneous TID WC   levothyroxine  112 mcg Oral Q0600   potassium chloride SA  20 mEq  Oral Daily   simvastatin  40 mg Oral q1800   sodium chloride flush  3 mL Intravenous Q12H   Continuous Infusions:     LOS: 3 days    Time spent: 25 MINS  This record has been created using Conservation officer, historic buildingsDragon voice recognition software. Errors have been sought and corrected,but may not always be located. Such creation errors do not reflect on the standard of care.   Arnetha CourserSumayya Koua Deeg, MD [facility]   If 7PM-7AM, please contact night-coverage

## 2021-03-04 NOTE — TOC Progression Note (Signed)
Transition of Care [facility]) - Progression Note    Patient Details  Name: Sue [name] MRN: [id number] Date of Birth: 02-10-1930  Transition [facility] CM/SW Contact  Geni Bers, RN Phone Number: 03/04/2021, 3:03 PM  Clinical Narrative:     A call was made to pt's son with no answer. A text was also sent to son for him to return a call.   Expected Discharge Plan: Home w Home Health Services Barriers to Discharge: Barriers Resolved  Expected Discharge Plan and Services Expected Discharge Plan: Home w Home Health Services   Discharge Planning Services: CM Consult Post Acute Care Choice: Home Health Living arrangements for the past 2 months: Single Family Home Expected Discharge Date: 03/01/21                         HH Arranged: PT, OT HH Agency: [facility] Date [facility] Agency Contacted: 03/01/21 Time HH Agency Contacted: 1204 Representative spoke with at [facility] Agency: Kandee Keen   Social Determinants of Health (SDOH) Interventions    Readmission Risk Interventions No flowsheet data found.

## 2021-03-05 LAB — URINE CULTURE: Culture: >=100000 — AB

## 2021-03-05 LAB — GLUCOSE, CAPILLARY
Glucose-Capillary: 105 mg/dL — ABNORMAL HIGH (ref 70–99)
Glucose-Capillary: 95 mg/dL (ref 70–99)
Glucose-Capillary: 102 mg/dL — ABNORMAL HIGH (ref 70–99)

## 2021-03-05 NOTE — Progress Notes (Signed)
PROGRESS NOTE    Sue [name]  CHE:[id number] DOB: [birth year] DOA: 02/26/2021 PCP: Sigmund Hazel, MD    Brief Narrative:  This 87 years old female with PMH significant for chronic diastolic CHF, type 2 diabetes, hypertension, hyperlipidemia, hypothyroidism, GERD, depression, hard of hearing and recent admission for acute CVA presented in the ED with generalized weakness. Patient was recently admitted from 01/25/2021 - 02/01/2021 after developing sudden onset of right-sided weakness.She was found to have suspected cardioembolic acute and subacute infarcts of the bilateral basal ganglia and corona radiata as well as right occipitoparietal lobes.  She received tPA.  She was started on DAPT with aspirin/Plavix for 3 weeks, now on aspirin alone. Patient was discharged to CIR where she remained until discharge to home on 02/23/21. She has developed acute urinary retention requiring Foley catheter and UTI growing E. coli and Citrobacter,  she was treated with Macrobid twice in rehab.  Patient was admitted for generalized weakness, She was found to have hypokalemia ( potassium 2.5). Which was replaced and corrected. Patient was also continued on IV hydration.  She feels much improved.  PT and OT recommended home PT/OT.  Home health services been arranged.  Patient was discharged 6/26 . She c/o: burning urination, UA consistent with UTI,  started on ceftriaxone,  urine culture positive for E. coli, pending susceptibility, recently had pansensitive E. coli UTI. Will need 2 weeks of antibiotics for frequently reoccurring UTI, transition to Keflex, medically stable for discharge but unable to reach family, per patient they are out of town??  Eventually TOC was able to reach 1 son, who provided another number, see TOC note. He promised to take her back home tomorrow??  Assessment & Plan:   Principal Problem:   Hypokalemia Active Problems:   Chronic diastolic CHF (congestive heart failure) (HCC)    Hypertension associated with diabetes (HCC)   Hyperlipidemia associated with type 2 diabetes mellitus (HCC)   Hypothyroidism   Diabetes mellitus type 2 in obese [facility])   Urinary retention   Generalized weakness  UTI: UA : Leukocytes large, nitrite positive. Urine culture with pansensitive E. coli as she had before on culture done on 02/16/2021.  Remained afebrile.  Initially received ceftriaxone and later transition to Keflex -Stop ceftriaxone and start her on Keflex-we will do 2 weeks of antibiotics. -Might be a colonization but patient continued to complain intermittently about lower abdominal pain.  Hypokalemia: Resolved. Patient presented with generalized weakness, found to have potassium 2.3, Likely sec. to Lasix.  Denies any GI losses.   Resume Lasix.  Generalized weakness with falls at home: In setting of recent prolonged admission after acute CVA and hypokalemia.   As reported PT/OT/SLP set up for home but has not begun yet. PT/ OT recommended home PT and OT-ordered  Suspected cardioembolic CVA: MRI brain 5/23 showed small acute/subacute infarcts involving b/l basal ganglia and corona radiata, small-moderate infarct with petechial hemorrhage right occipitoparietal lobes. Continue aspirin and statin Follow-up with neurology outpatient.   Urinary retention : Resolved. She has developed urinary retention while in inpatient rehab.   Foley catheter remained in place.  Recently treated for Citrobacter and E. coli UTI with Macrobid.   Urinalysis on admission likely colonized without acute infection. Foley catheter removed, she voided . Discontinue sertraline.   Chronic diastolic CHF: Stable without acute decompensation.   Lasix resumed. Monitor strict I/O's.   Type 2 diabetes: A1c 6.9%.  Not on medical management as an outpatient.   Continue Regular insulin sliding scale while  in hospital.  Hypertension: Continue Coreg.   Hypothyroidism: Continue Synthroid.    Hyperlipidemia: Continue simvastatin.   Depression: Continue bupropion.   DVT prophylaxis: Lovenox Code Status: Full code. Family Communication: TOC was eventually able to reach 1 son.  Called that number with no response. Disposition Plan:    Status is: Inpatient  Remains inpatient appropriate because:Inpatient level of care appropriate due to severity of illness  Dispo: The patient is from: Home              Anticipated d/c is to: Home              Patient currently is medically stable for discharge, unsafe discharge as family cannot be reached at this time.   Difficult to place patient No   Consultants:  None  Procedures: None  Antimicrobials:   Anti-infectives (From admission, onward)    Start     Dose/Rate Route Frequency Ordered Stop   03/04/21 1400  cephALEXin (KEFLEX) capsule 500 mg        500 mg Oral 4 times daily 03/04/21 1142     03/02/21 1400  cefTRIAXone (ROCEPHIN) 1 g in sodium chloride 0.9 % 100 mL IVPB  Status:  Discontinued        1 g 200 mL/hr over 30 Minutes Intravenous Every 24 hours 03/02/21 1334 03/04/21 1142      Subjective: Patient was resting comfortably when seen today.  No new complaint.  Very hard of hearing.  Still does not think that anybody at home.  Objective: Vitals:   03/04/21 2125 03/05/21 0456 03/05/21 0500 03/05/21 1355  BP: 140/90 139/69  (!) 139/54  Pulse: 75 68  74  Resp: 20 18  18   Temp: 98.1 F (36.7 C) 97.8 F (36.6 C)  98 F (36.7 C)  TempSrc: Oral Oral  Oral  SpO2: 95% 97%  99%  Weight:   79.5 kg   Height:        Intake/Output Summary (Last 24 hours) at 03/05/2021 1600 Last data filed at 03/05/2021 1350 Gross per 24 hour  Intake 180 ml  Output 500 ml  Net -320 ml    Filed Weights   03/02/21 0500 03/03/21 0500 03/05/21 0500  Weight: 79.5 kg 79.7 kg 79.5 kg    Examination:  General.  Frail elderly lady, in no acute distress. Pulmonary.  Lungs clear bilaterally, normal respiratory effort. CV.  Regular  rate and rhythm, no JVD, rub or murmur. Abdomen.  Soft, nontender, nondistended, BS positive. CNS.  Alert and oriented .  No focal neurologic deficit. Extremities.  No edema, no cyanosis, pulses intact and symmetrical. Psychiatry.  Judgment and insight appears normal.   Data Reviewed: I have personally reviewed following labs and imaging studies  CBC: Recent Labs  Lab 02/26/21 1933 02/27/21 0340 02/28/21 0424 03/03/21 0321  WBC 11.6* 11.3* 10.2 9.4  NEUTROABS 8.1*  --   --   --   HGB 16.7* 16.8* 14.2 15.6*  HCT 50.1* 52.7* 44.2 48.3*  MCV 96.7 99.1 99.8 98.4  PLT 528* 472* 484* 443*    Basic Metabolic Panel: Recent Labs  Lab 02/26/21 1933 02/27/21 0340 02/28/21 0424 03/03/21 0321  NA 141 138 137 138  K 2.5* 3.6 3.9 3.9  CL 108 104 105 105  CO2 25 23 27 28   GLUCOSE 114* 103* 96 104*  BUN 12 14 15 12   CREATININE 0.83 0.82 0.84 0.87  CALCIUM 7.9* 8.9 9.0 9.2  MG 1.8 2.0 2.2 2.2  PHOS  --   --  3.8 4.1    GFR: Estimated Creatinine Clearance: 44.7 mL/min (by C-G formula based on SCr of 0.87 mg/dL). Liver Function Tests: Recent Labs  Lab 02/27/21 0340  AST 24  ALT 21  ALKPHOS 74  BILITOT 0.6  PROT 6.6  ALBUMIN 3.5    No results for input(s): LIPASE, AMYLASE in the last 168 hours. No results for input(s): AMMONIA in the last 168 hours. Coagulation Profile: No results for input(s): INR, PROTIME in the last 168 hours. Cardiac Enzymes: No results for input(s): CKTOTAL, CKMB, CKMBINDEX, TROPONINI in the last 168 hours. BNP (last 3 results) No results for input(s): PROBNP in the last 8760 hours. HbA1C: No results for input(s): HGBA1C in the last 72 hours. CBG: Recent Labs  Lab 03/04/21 1137 03/04/21 1633 03/04/21 2124 03/05/21 0744 03/05/21 1115  GLUCAP 136* 119* 99 105* 95    Lipid Profile: No results for input(s): CHOL, HDL, LDLCALC, TRIG, CHOLHDL, LDLDIRECT in the last 72 hours. Thyroid Function Tests: No results for input(s): TSH, T4TOTAL,  FREET4, T3FREE, THYROIDAB in the last 72 hours. Anemia Panel: No results for input(s): VITAMINB12, FOLATE, FERRITIN, TIBC, IRON, RETICCTPCT in the last 72 hours. Sepsis Labs: No results for input(s): PROCALCITON, LATICACIDVEN in the last 168 hours.  Recent Results (from the past 240 hour(s))  SARS CORONAVIRUS 2 (TAT 6-24 HRS) Nasopharyngeal Nasopharyngeal Swab     Status: None   Collection Time: 02/26/21 11:17 PM   Specimen: Nasopharyngeal Swab  Result Value Ref Range Status   SARS Coronavirus 2 NEGATIVE NEGATIVE Final    Comment: (NOTE) SARS-CoV-2 target nucleic acids are NOT DETECTED.  The SARS-CoV-2 RNA is generally detectable in upper and lower respiratory specimens during the acute phase of infection. Negative results do not preclude SARS-CoV-2 infection, do not rule out co-infections with other pathogens, and should not be used as the sole basis for treatment or other patient management decisions. Negative results must be combined with clinical observations, patient history, and epidemiological information. The expected result is Negative.  Fact Sheet for Patients: HairSlick.no  Fact Sheet for Healthcare Providers: quierodirigir.com  This test is not yet approved or cleared by the [place] FDA and  has been authorized for detection and/or diagnosis of SARS-CoV-2 by FDA under an Emergency Use Authorization (EUA). This EUA will remain  in effect (meaning this test can be used) for the duration of the COVID-19 declaration under Se ction 564(b)(1) of the Act, 21 U.S.C. section 360bbb-3(b)(1), unless the authorization is terminated or revoked sooner.  Performed at [facility], [address] [place], [place] [address]   Culture, Urine     Status: Abnormal   Collection Time: 03/02/21 12:46 PM   Specimen: Urine, Clean Catch  Result Value Ref Range Status   Specimen Description   Final    URINE, CLEAN  CATCH Performed at [facility], [address] [place], [place] [address]    Special Requests   Final    NONE Performed at [facility], [address], [place], [place] [address]    Culture >=100,000 COLONIES/mL ESCHERICHIA COLI (A)  Final   Report Status 03/05/2021 FINAL  Final   Organism ID, Bacteria ESCHERICHIA COLI (A)  Final      Susceptibility   Escherichia coli - MIC*    AMPICILLIN 4 SENSITIVE Sensitive     CEFAZOLIN <=4 SENSITIVE Sensitive     CEFEPIME <=0.12 SENSITIVE Sensitive     CEFTRIAXONE <=0.25 SENSITIVE Sensitive  CIPROFLOXACIN <=0.25 SENSITIVE Sensitive     GENTAMICIN <=1 SENSITIVE Sensitive     IMIPENEM <=0.25 SENSITIVE Sensitive     NITROFURANTOIN <=16 SENSITIVE Sensitive     TRIMETH/SULFA <=20 SENSITIVE Sensitive     AMPICILLIN/SULBACTAM <=2 SENSITIVE Sensitive     PIP/TAZO <=4 SENSITIVE Sensitive     * >=100,000 COLONIES/mL ESCHERICHIA COLI     Radiology Studies: No results found.  Scheduled Meds:  aspirin  81 mg Oral Daily   buPROPion  150 mg Oral BID   carvedilol  3.125 mg Oral BID WC   cephALEXin  500 mg Oral QID   enoxaparin (LOVENOX) injection  40 mg Subcutaneous Q24H   insulin aspart  0-9 Units Subcutaneous TID WC   levothyroxine  112 mcg Oral Q0600   potassium chloride SA  20 mEq Oral Daily   simvastatin  40 mg Oral q1800   sodium chloride flush  3 mL Intravenous Q12H   Continuous Infusions:     LOS: 4 days    Time spent: 25 MINS  This record has been created using Conservation officer, historic buildingsDragon voice recognition software. Errors have been sought and corrected,but may not always be located. Such creation errors do not reflect on the standard of care.   Arnetha CourserSumayya Shannah Conteh, MD [facility]   If 7PM-7AM, please contact night-coverage

## 2021-03-05 NOTE — Plan of Care (Signed)
  Problem: Clinical Measurements: Goal: Diagnostic test results will improve Outcome: Progressing Goal: Respiratory complications will improve Outcome: Progressing   

## 2021-03-05 NOTE — Progress Notes (Signed)
Patient has not voided during shift. Bladder scan showed 280 ml. Patient attempted to void but was not successful. Informed patient that an In and out cath would need to be done if unable to void and patient refused. Encouraged patient to drink fluids. Will continue to monitor closely.

## 2021-03-05 NOTE — TOC Progression Note (Signed)
Transition of Care [facility]) - Progression Note    Patient Details  Name: Sue [name] MRN: [id number] Date of Birth: 08/30/1930  Transition of Care [facility]) CM/SW Contact  Geni Bers, RN Phone Number: 03/05/2021, 1:14 PM  Clinical Narrative:     A call was made again to pt's son Thermon Leyland, who she lives with no answer. Pt's son Riley Lam [phone] was called who gave Blaine's cell number to CM [phone]. Added to Epic. Plan for pt to discharge in AM and [facility] agreed.   Expected Discharge Plan: Home w Home Health Services Barriers to Discharge: Barriers Resolved  Expected Discharge Plan and Services Expected Discharge Plan: Home w Home Health Services   Discharge Planning Services: CM Consult Post Acute Care Choice: Home Health Living arrangements for the past 2 months: Single Family Home Expected Discharge Date: 03/01/21                         HH Arranged: PT, OT HH Agency: [facility] Date [facility] Agency Contacted: 03/01/21 Time HH Agency Contacted: 1204 Representative spoke with at [facility] Agency: Kandee Keen   Social Determinants of Health (SDOH) Interventions    Readmission Risk Interventions No flowsheet data found.

## 2021-03-06 ENCOUNTER — Ambulatory Visit (INDEPENDENT_AMBULATORY_CARE_PROVIDER_SITE_OTHER): Payer: HMO

## 2021-03-06 DIAGNOSIS — I5042 Chronic combined systolic (congestive) and diastolic (congestive) heart failure: Secondary | ICD-10-CM | POA: Diagnosis not present

## 2021-03-06 LAB — GLUCOSE, CAPILLARY
Glucose-Capillary: 138 mg/dL — ABNORMAL HIGH (ref 70–99)
Glucose-Capillary: 103 mg/dL — ABNORMAL HIGH (ref 70–99)

## 2021-03-06 MED ORDER — TAMSULOSIN HCL 0.4 MG PO CAPS: 0.4000 mg | ORAL_CAPSULE | Freq: Every day | ORAL | 0 refills | Status: AC

## 2021-03-06 MED ORDER — CEPHALEXIN 500 MG PO CAPS: 500.0000 mg | ORAL_CAPSULE | Freq: Four times a day (QID) | ORAL | 0 refills | Status: AC

## 2021-03-06 MED ORDER — TAMSULOSIN HCL 0.4 MG PO CAPS: 0.4000 mg | ORAL_CAPSULE | Freq: Every day | ORAL | Status: DC

## 2021-03-06 NOTE — TOC Transition Note (Signed)
Transition of Care [facility]) - CM/SW Discharge Note   Patient Details  Name: ALPHA CHOUINARD MRN: [id number] Date of Birth: December 01, 1929  Transition [facility] Care [facility]) CM/SW Contact:  Ida Rogue, LCSW Phone Number: 03/06/2021, 12:33 PM   Clinical Narrative:   Patient who is stable for d/c will return home via PTAR.  Spoke with son to confirm.  PTAR arranged. Nursing, please call son when Sharin Mons arrives to let him know she is on her way.  TOC sign off.    Final next level of care: Home w Home Health Services Barriers to Discharge: Barriers Resolved   Patient Goals and CMS Choice Patient states their goals for this hospitalization and ongoing recovery are:: To return back home with home health. CMS Medicare.gov Compare Post Acute Care list provided to:: Patient Represenative (must comment) Choice offered to / list presented to : Adult Children  Discharge Placement                       Discharge Plan and Services   Discharge Planning Services: CM Consult Post Acute Care Choice: Home Health                    HH Arranged: PT, OT [facility] Agency: [facility] Date [facility] Agency Contacted: 03/01/21 Time HH Agency Contacted: 1204 Representative spoke with at [facility] Agency: Kandee Keen  Social Determinants of Health (SDOH) Interventions     Readmission Risk Interventions No flowsheet data found.

## 2021-03-06 NOTE — Discharge Summary (Signed)
Physician Discharge Summary  Sue [name]:[id number] DOB: 1930/08/28 DOA: 02/26/2021  PCP: Sigmund Hazel, MD  Admit date: 02/26/2021 Discharge date: 03/06/2021  Admitted From: Home Disposition: Home with home health  Recommendations for Outpatient Follow-up:  Follow up with PCP in 1-2 weeks Please obtain BMP/CBC in one week your next doctors visit.  Take oral Keflex to complete 2-week course Flomax started to help with urinary retention.  Eventually she may require periodic straight cath at home.  I discussed this with son.  Educated son on recurrent urinary retention can lead to recurrent urine infections.  Home Health: PT/OT/RN Discharge Condition: Stable CODE STATUS: Full Diet recommendation: Diabetic  Brief/Interim Summary: 87 years old female with PMH significant for chronic diastolic CHF, type 2 diabetes, hypertension, hyperlipidemia, hypothyroidism, GERD, depression, hard of hearing and recent admission for acute CVA presented in the ED with generalized weakness. Patient was recently admitted from 01/25/2021 - 02/01/2021 after developing sudden onset of right-sided weakness.She was found to have suspected cardioembolic acute and subacute infarcts of the bilateral basal ganglia and corona radiata as well as right occipitoparietal lobes.  She received tPA.  She was started on DAPT with aspirin/Plavix for 3 weeks, now on aspirin alone. Patient was discharged to CIR where she remained until discharge to home on 02/23/21. She has developed acute urinary retention requiring Foley catheter and UTI growing E. coli and Citrobacter,  she was treated with Macrobid twice in rehab. Patient was admitted for generalized weakness, She was found to have hypokalemia ( potassium 2.5). Which was replaced and corrected. Patient was also continued on IV hydration.  She feels much improved.  PT and OT recommended home PT/OT.  Home health services been arranged.  Patient was discharged 6/26 . She c/o: burning  urination, UA consistent with UTI,  started on ceftriaxone,  urine culture positive for E. Coli.  It was pansensitive therefore transition to oral Keflex to complete 2-week course.  Foley catheter was removed, her urinary retention improved.  But upon repeat bladder scan she was retaining about 300 cc of urine.  I suspect recurrent urinary retention is leading to her recurrent UTI.  I have started her on Flomax and also advised patient's son that she may require periodic straight cath in the future to avoid recurrent infection.  Foley catheter may not likely be a good idea at this time but if they have trouble straight cathing at home then Foley will be last option.  Patient's son updated by me.  Stable for discharge.  Body mass index is 30.08 kg/m.     Urinary tract infection secondary to recurrent urinary retention E. coli, pansensitive - We will transition IV Rocephin to Keflex to complete 2-week course.  Flomax started to help with retention.  Eventually down the road she may require periodic straight cath at home.  Son is agreeable to this and will have her reevaluated as outpatient.  Generalized weakness with multiple falls - From recent CVA.  Seen by PT/OT.  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  Outpatient neurology follow-up.  Diabetes mellitus type 2-stable.  Resume home meds Essential hypertension-resume home meds Hypothyroidism-Synthroid Hyperlipidemia-statin    Discharge Diagnoses:  Principal Problem:   Hypokalemia Active Problems:   Chronic diastolic CHF (congestive heart failure) (HCC)   Hypertension associated with diabetes (HCC)   Hyperlipidemia associated with type 2 diabetes mellitus (HCC)   Hypothyroidism   Diabetes mellitus type 2 in obese (HCC)  Urinary retention   Generalized weakness    Subjective: Patient is hard of hearing but no problems, no acute events overnight.  This morning she had urinary  retention therefore straight cath performed.  Discussed this in detail with patient's son plain as well.  Discharge Exam: Vitals:   03/05/21 2047 03/06/21 0559  BP: (!) 141/90 133/89  Pulse: 77 77  Resp: 20 20  Temp: 97.7 F (36.5 C) 98 F (36.7 C)  SpO2: 98% 91%   Vitals:   03/05/21 0500 03/05/21 1355 03/05/21 2047 03/06/21 0559  BP:  (!) 139/54 (!) 141/90 133/89  Pulse:  74 77 77  Resp:  18 20 20   Temp:  98 F (36.7 C) 97.7 F (36.5 C) 98 F (36.7 C)  TempSrc:  Oral Oral Oral  SpO2:  99% 98% 91%  Weight: 79.5 kg     Height:        General: Pt is alert, awake, not in acute distress, hard of hearing.  Elderly. Cardiovascular: RRR, S1/S2 +, no rubs, no gallops Respiratory: CTA bilaterally, no wheezing, no rhonchi Abdominal: Soft, NT, ND, bowel sounds + Extremities: no edema, no cyanosis  Discharge Instructions  Discharge Instructions     Call MD for:  difficulty breathing, headache or visual disturbances   Complete by: As directed    Call MD for:  persistant dizziness or light-headedness   Complete by: As directed    Call MD for:  persistant nausea and vomiting   Complete by: As directed    Diet - low sodium heart healthy   Complete by: As directed    Diet Carb Modified   Complete by: As directed    Discharge instructions   Complete by: As directed    Advised to follow-up with primary care physician in 1 week. Advised to continue home PT and OT, appointment has been made.   Increase activity slowly   Complete by: As directed       Allergies as of 03/06/2021   No Known Allergies      Medication List     STOP taking these medications    benzocaine 10 % mucosal gel Commonly known as: ORAJEL   diclofenac Sodium 1 % Gel Commonly known as: VOLTAREN   mouth rinse Liqd solution   pantoprazole 40 MG tablet Commonly known as: PROTONIX   senna-docusate 8.6-50 MG tablet Commonly known as: Senokot-S   tobramycin-dexamethasone ophthalmic  solution Commonly known as: TOBRADEX       TAKE these medications    acetaminophen 325 MG tablet Commonly known as: TYLENOL Take 1-2 tablets (325-650 mg total) by mouth every 4 (four) hours as needed for mild pain.   aspirin 81 MG chewable tablet Chew 1 tablet (81 mg total) by mouth daily.   buPROPion 150 MG 12 hr tablet Commonly known as: WELLBUTRIN SR Take 1 tablet (150 mg total) by mouth 2 (two) times daily.   carvedilol 3.125 MG tablet Commonly known as: COREG Take 1 tablet (3.125 mg total) by mouth 2 (two) times daily with a meal.   cephALEXin 500 MG capsule Commonly known as: KEFLEX Take 1 capsule (500 mg total) by mouth 4 (four) times daily for 7 days.   furosemide 40 MG tablet Commonly known as: LASIX Take 1 tablet (40 mg total) by mouth 2 (two) times daily.   levothyroxine 112 MCG tablet Commonly known as: SYNTHROID Take 1 tablet (112 mcg total) by mouth daily before breakfast.   nitrofurantoin (macrocrystal-monohydrate) 100 MG capsule Commonly  known as: MACROBID Take 1 capsule (100 mg total) by mouth every 12 (twelve) hours.   polyethylene glycol 17 g packet Commonly known as: MIRALAX / GLYCOLAX Take 17 g by mouth daily. What changed:  when to take this reasons to take this   potassium chloride SA 20 MEQ tablet Commonly known as: KLOR-CON Take 1 tablet (20 mEq total) by mouth daily.   simvastatin 40 MG tablet Commonly known as: ZOCOR Take 1 tablet (40 mg total) by mouth daily at 6 PM. What changed: when to take this   tamsulosin 0.4 MG Caps capsule Commonly known as: FLOMAX Take 1 capsule (0.4 mg total) by mouth daily after supper.        Follow-up Information     Sigmund HazelMiller, Lisa, MD Follow up in 1 week(s).   Specialty: Family Medicine Contact information: [address] [place] [address] [phone]         Jodelle Redhristopher, Bridgette, MD .   Specialty: Cardiology Contact information: [address] [place]  [id number] [phone]         Care, [facility] Follow [facility].   Specialty: Home Health Services [facility] Contact information: [address] [place] [id number] [phone]                No Known Allergies  You were cared for by a hospitalist during your hospital stay. If you have any questions about your discharge medications or the care you received while you were in the hospital after you are discharged, you can call the unit and asked to speak with the hospitalist on call if the hospitalist that took care of you is not available. Once you are discharged, your primary care physician will handle any further medical issues. Please note that no refills for any discharge medications will be authorized once you are discharged, as it is imperative that you return to your primary care physician (or establish a relationship with a primary care physician if you do not have one) for your aftercare needs so that they can reassess your need for medications and monitor your lab values.   Procedures/Studies: No results found.   The results of significant diagnostics from this hospitalization (including imaging, microbiology, ancillary and laboratory) are listed below for reference.     Microbiology: Recent Results (from the past 240 hour(s))  SARS CORONAVIRUS 2 (TAT 6-24 HRS) Nasopharyngeal Nasopharyngeal Swab     Status: None   Collection Time: 02/26/21 11:17 PM   Specimen: Nasopharyngeal Swab  Result Value Ref Range Status   SARS Coronavirus 2 NEGATIVE NEGATIVE Final    Comment: (NOTE) SARS-CoV-2 target nucleic acids are NOT DETECTED.  The SARS-CoV-2 RNA is generally detectable in upper and lower respiratory specimens during the acute phase of infection. Negative results do not preclude SARS-CoV-2 infection, do not rule out co-infections with other pathogens, and should not be used as the sole basis for treatment or other  patient management decisions. Negative results must be combined with clinical observations, patient history, and epidemiological information. The expected result is Negative.  Fact Sheet for Patients: HairSlick.nohttps://www.fda.gov/media/138098/download  Fact Sheet for Healthcare Providers: quierodirigir.comhttps://www.fda.gov/media/138095/download  This test is not yet approved or cleared by the [place] FDA and  has been authorized for detection and/or diagnosis of SARS-CoV-2 by FDA under an Emergency Use Authorization (EUA). This EUA will remain  in effect (meaning this test can be used) for the duration of the COVID-19 declaration under Se  ction 564(b)(1) of the Act, 21 U.S.C. section 360bbb-3(b)(1), unless the authorization is terminated or revoked sooner.  Performed at [facility], [address], [place], [place] [address]   Culture, Urine     Status: Abnormal   Collection Time: 03/02/21 12:46 PM   Specimen: Urine, Clean Catch  Result Value Ref Range Status   Specimen Description   Final    URINE, CLEAN CATCH Performed at [facility], [address] [place], [place] [address]    Special Requests   Final    NONE Performed at [facility], [address] [place], [place] [address]    Culture >=100,000 COLONIES/mL ESCHERICHIA COLI (A)  Final   Report Status 03/05/2021 FINAL  Final   Organism ID, Bacteria ESCHERICHIA COLI (A)  Final      Susceptibility   Escherichia coli - MIC*    AMPICILLIN 4 SENSITIVE Sensitive     CEFAZOLIN <=4 SENSITIVE Sensitive     CEFEPIME <=0.12 SENSITIVE Sensitive     CEFTRIAXONE <=0.25 SENSITIVE Sensitive     CIPROFLOXACIN <=0.25 SENSITIVE Sensitive     GENTAMICIN <=1 SENSITIVE Sensitive     IMIPENEM <=0.25 SENSITIVE Sensitive     NITROFURANTOIN <=16 SENSITIVE Sensitive     TRIMETH/SULFA <=20 SENSITIVE Sensitive     AMPICILLIN/SULBACTAM <=2 SENSITIVE Sensitive     PIP/TAZO <=4 SENSITIVE Sensitive     * >=100,000  COLONIES/mL ESCHERICHIA COLI     Labs: BNP (last 3 results) Recent Labs    01/09/21 1027  BNP 57.0   Basic Metabolic Panel: Recent Labs  Lab 02/28/21 0424 03/03/21 0321  NA 137 138  K 3.9 3.9  CL 105 105  CO2 27 28  GLUCOSE 96 104*  BUN 15 12  CREATININE 0.84 0.87  CALCIUM 9.0 9.2  MG 2.2 2.2  PHOS 3.8 4.1   Liver Function Tests: No results for input(s): AST, ALT, ALKPHOS, BILITOT, PROT, ALBUMIN in the last 168 hours. No results for input(s): LIPASE, AMYLASE in the last 168 hours. No results for input(s): AMMONIA in the last 168 hours. CBC: Recent Labs  Lab 02/28/21 0424 03/03/21 0321  WBC 10.2 9.4  HGB 14.2 15.6*  HCT 44.2 48.3*  MCV 99.8 98.4  PLT 484* 443*   Cardiac Enzymes: No results for input(s): CKTOTAL, CKMB, CKMBINDEX, TROPONINI in the last 168 hours. BNP: Invalid input(s): POCBNP CBG: Recent Labs  Lab 03/05/21 0744 03/05/21 1115 03/05/21 1622 03/06/21 0807 03/06/21 1115  GLUCAP 105* 95 102* 138* 103*   D-Dimer No results for input(s): DDIMER in the last 72 hours. Hgb A1c No results for input(s): HGBA1C in the last 72 hours. Lipid Profile No results for input(s): CHOL, HDL, LDLCALC, TRIG, CHOLHDL, LDLDIRECT in the last 72 hours. Thyroid function studies No results for input(s): TSH, T4TOTAL, T3FREE, THYROIDAB in the last 72 hours.  Invalid input(s): FREET3 Anemia work up No results for input(s): VITAMINB12, FOLATE, FERRITIN, TIBC, IRON, RETICCTPCT in the last 72 hours. Urinalysis    Component Value Date/Time   COLORURINE YELLOW 03/02/2021 1246   APPEARANCEUR CLOUDY (A) 03/02/2021 1246   LABSPEC 1.010 03/02/2021 1246   PHURINE 6.0 03/02/2021 1246   GLUCOSEU NEGATIVE 03/02/2021 1246   HGBUR SMALL (A) 03/02/2021 1246   BILIRUBINUR NEGATIVE 03/02/2021 1246   KETONESUR NEGATIVE 03/02/2021 1246   PROTEINUR NEGATIVE 03/02/2021 1246   NITRITE POSITIVE (A) 03/02/2021 1246   LEUKOCYTESUR LARGE (A) 03/02/2021 1246   Sepsis Labs Invalid  input(s): PROCALCITONIN,  WBC,  LACTICIDVEN Microbiology Recent Results (from the past 240 hour(s))  SARS CORONAVIRUS 2 (TAT 6-24 HRS) Nasopharyngeal Nasopharyngeal Swab     Status: None   Collection Time: 02/26/21 11:17 PM   Specimen: Nasopharyngeal Swab  Result Value Ref Range Status   SARS Coronavirus 2 NEGATIVE NEGATIVE Final    Comment: (NOTE) SARS-CoV-2 target nucleic acids are NOT DETECTED.  The SARS-CoV-2 RNA is generally detectable in upper and lower respiratory specimens during the acute phase of infection. Negative results do not preclude SARS-CoV-2 infection, do not rule out co-infections with other pathogens, and should not be used as the sole basis for treatment or other patient management decisions. Negative results must be combined with clinical observations, patient history, and epidemiological information. The expected result is Negative.  Fact Sheet for Patients: HairSlick.no  Fact Sheet for Healthcare Providers: quierodirigir.com  This test is not yet approved or cleared by the [place] FDA and  has been authorized for detection and/or diagnosis of SARS-CoV-2 by FDA under an Emergency Use Authorization (EUA). This EUA will remain  in effect (meaning this test can be used) for the duration of the COVID-19 declaration under Se ction 564(b)(1) of the Act, 21 U.S.C. section 360bbb-3(b)(1), unless the authorization is terminated or revoked sooner.  Performed at [facility], [address] [place], [place] [address]   Culture, Urine     Status: Abnormal   Collection Time: 03/02/21 12:46 PM   Specimen: Urine, Clean Catch  Result Value Ref Range Status   Specimen Description   Final    URINE, CLEAN CATCH Performed at [facility], [address] [place], [place] [address]    Special Requests   Final    NONE Performed at [facility], [address]  [place], [place] [address]    Culture >=100,000 COLONIES/mL ESCHERICHIA COLI (A)  Final   Report Status 03/05/2021 FINAL  Final   Organism ID, Bacteria ESCHERICHIA COLI (A)  Final      Susceptibility   Escherichia coli - MIC*    AMPICILLIN 4 SENSITIVE Sensitive     CEFAZOLIN <=4 SENSITIVE Sensitive     CEFEPIME <=0.12 SENSITIVE Sensitive     CEFTRIAXONE <=0.25 SENSITIVE Sensitive     CIPROFLOXACIN <=0.25 SENSITIVE Sensitive     GENTAMICIN <=1 SENSITIVE Sensitive     IMIPENEM <=0.25 SENSITIVE Sensitive     NITROFURANTOIN <=16 SENSITIVE Sensitive     TRIMETH/SULFA <=20 SENSITIVE Sensitive     AMPICILLIN/SULBACTAM <=2 SENSITIVE Sensitive     PIP/TAZO <=4 SENSITIVE Sensitive     * >=100,000 COLONIES/mL ESCHERICHIA COLI     Time coordinating discharge:  I have spent 35 minutes face to face with the patient and on the ward discussing the patients care, assessment, plan and disposition with other care givers. >50% of the time was devoted counseling the patient about the risks and benefits of treatment/Discharge disposition and coordinating care.   SIGNED:   Dimple Nanas, MD  [facility] 03/06/2021, 11:55 AM   If 7PM-7AM, please contact night-coverage

## 2021-03-07 ENCOUNTER — Emergency Department (HOSPITAL_COMMUNITY): Payer: HMO

## 2021-03-07 ENCOUNTER — Encounter (HOSPITAL_COMMUNITY): Payer: Self-pay | Admitting: Radiology

## 2021-03-07 ENCOUNTER — Emergency Department (HOSPITAL_COMMUNITY)
Admission: EM | Admit: 2021-03-07 | Discharge: 2021-03-10 | Disposition: A | Discharge status: Home / Self Care | Payer: HMO | Attending: Emergency Medicine | Admitting: Emergency Medicine

## 2021-03-07 DIAGNOSIS — Z20822 Contact with and (suspected) exposure to covid-19: Secondary | ICD-10-CM | POA: Insufficient documentation

## 2021-03-07 DIAGNOSIS — Z9181 History of falling: Secondary | ICD-10-CM | POA: Diagnosis not present

## 2021-03-07 DIAGNOSIS — W06XXXA Fall from bed, initial encounter: Secondary | ICD-10-CM | POA: Insufficient documentation

## 2021-03-07 DIAGNOSIS — I5033 Acute on chronic diastolic (congestive) heart failure: Secondary | ICD-10-CM | POA: Diagnosis not present

## 2021-03-07 DIAGNOSIS — Z7409 Other reduced mobility: Secondary | ICD-10-CM | POA: Insufficient documentation

## 2021-03-07 DIAGNOSIS — S32010A Wedge compression fracture of first lumbar vertebra, initial encounter for closed fracture: Secondary | ICD-10-CM

## 2021-03-07 DIAGNOSIS — Z79899 Other long term (current) drug therapy: Secondary | ICD-10-CM | POA: Diagnosis not present

## 2021-03-07 DIAGNOSIS — W19XXXA Unspecified fall, initial encounter: Secondary | ICD-10-CM

## 2021-03-07 DIAGNOSIS — R2689 Other abnormalities of gait and mobility: Secondary | ICD-10-CM

## 2021-03-07 DIAGNOSIS — I110 Hypertensive heart disease with heart failure: Secondary | ICD-10-CM | POA: Diagnosis not present

## 2021-03-07 DIAGNOSIS — E119 Type 2 diabetes mellitus without complications: Secondary | ICD-10-CM | POA: Insufficient documentation

## 2021-03-07 DIAGNOSIS — Z87891 Personal history of nicotine dependence: Secondary | ICD-10-CM | POA: Insufficient documentation

## 2021-03-07 DIAGNOSIS — S3992XA Unspecified injury of lower back, initial encounter: Secondary | ICD-10-CM | POA: Diagnosis present

## 2021-03-07 DIAGNOSIS — Z7982 Long term (current) use of aspirin: Secondary | ICD-10-CM | POA: Insufficient documentation

## 2021-03-07 DIAGNOSIS — R531 Weakness: Secondary | ICD-10-CM | POA: Diagnosis not present

## 2021-03-07 DIAGNOSIS — S32019A Unspecified fracture of first lumbar vertebra, initial encounter for closed fracture: Secondary | ICD-10-CM | POA: Diagnosis not present

## 2021-03-07 DIAGNOSIS — E039 Hypothyroidism, unspecified: Secondary | ICD-10-CM | POA: Insufficient documentation

## 2021-03-07 DIAGNOSIS — S22060A Wedge compression fracture of T7-T8 vertebra, initial encounter for closed fracture: Secondary | ICD-10-CM

## 2021-03-07 DIAGNOSIS — S22069A Unspecified fracture of T7-T8 vertebra, initial encounter for closed fracture: Secondary | ICD-10-CM | POA: Diagnosis not present

## 2021-03-07 LAB — CBC WITH DIFFERENTIAL/PLATELET
Abs Immature Granulocytes: 0.06 10*3/uL (ref 0.00–0.07)
Basophils Absolute: 0.1 10*3/uL (ref 0.0–0.1)
Basophils Relative: 1 %
Eosinophils Absolute: 0.1 10*3/uL (ref 0.0–0.5)
Eosinophils Relative: 2 %
HCT: 48 % — ABNORMAL HIGH (ref 36.0–46.0)
Hemoglobin: 15.6 g/dL — ABNORMAL HIGH (ref 12.0–15.0)
Immature Granulocytes: 1 %
Lymphocytes Relative: 18 %
Lymphs Abs: 1.4 10*3/uL (ref 0.7–4.0)
MCH: 31.9 pg (ref 26.0–34.0)
MCHC: 32.5 g/dL (ref 30.0–36.0)
MCV: 98.2 fL (ref 80.0–100.0)
Monocytes Absolute: 0.8 10*3/uL (ref 0.1–1.0)
Monocytes Relative: 11 %
Neutro Abs: 5.3 10*3/uL (ref 1.7–7.7)
Neutrophils Relative %: 67 %
Platelets: 512 10*3/uL — ABNORMAL HIGH (ref 150–400)
RBC: 4.89 MIL/uL (ref 3.87–5.11)
RDW: 15.1 % (ref 11.5–15.5)
WBC: 7.8 10*3/uL (ref 4.0–10.5)
nRBC: 0 % (ref 0.0–0.2)

## 2021-03-07 LAB — BASIC METABOLIC PANEL
Anion gap: 9 (ref 5–15)
BUN: 12 mg/dL (ref 8–23)
CO2: 25 mmol/L (ref 22–32)
Calcium: 9.4 mg/dL (ref 8.9–10.3)
Chloride: 107 mmol/L (ref 98–111)
Creatinine, Ser: 0.89 mg/dL (ref 0.44–1.00)
GFR, Estimated: >60 mL/min (ref >60)
Glucose, Bld: 114 mg/dL — ABNORMAL HIGH (ref 70–99)
Potassium: 4 mmol/L (ref 3.5–5.1)
Sodium: 141 mmol/L (ref 135–145)

## 2021-03-07 LAB — URINALYSIS, ROUTINE W REFLEX MICROSCOPIC
Bilirubin Urine: NEGATIVE
Glucose, UA: NEGATIVE mg/dL
Hgb urine dipstick: NEGATIVE
Ketones, ur: 5 mg/dL — AB
Nitrite: NEGATIVE
Protein, ur: NEGATIVE mg/dL
Specific Gravity, Urine: 1.017 (ref 1.005–1.030)
pH: 5 (ref 5.0–8.0)

## 2021-03-07 LAB — RESP PANEL BY RT-PCR (FLU A&B, COVID) ARPGX2
Influenza A by PCR: NEGATIVE
Influenza B by PCR: NEGATIVE
SARS Coronavirus 2 by RT PCR: NEGATIVE

## 2021-03-07 MED ORDER — CARVEDILOL 3.125 MG PO TABS
3.1250 mg | ORAL_TABLET | Freq: Two times a day (BID) | ORAL | Status: DC
  Administered 2021-03-08 – 2021-03-10 (×5): 3.125 mg via ORAL
  Filled 2021-03-07 (×5): qty 1

## 2021-03-07 MED ORDER — POLYETHYLENE GLYCOL 3350 17 G PO PACK
17.0000 g | PACK | Freq: Every day | ORAL | Status: DC
  Administered 2021-03-09: 17 g via ORAL
  Filled 2021-03-07 (×3): qty 1

## 2021-03-07 MED ORDER — FUROSEMIDE 40 MG PO TABS
40.0000 mg | ORAL_TABLET | Freq: Two times a day (BID) | ORAL | Status: DC
  Administered 2021-03-07 – 2021-03-10 (×6): 40 mg via ORAL
  Filled 2021-03-07 (×6): qty 1

## 2021-03-07 MED ORDER — HYDROXYZINE HCL 25 MG PO TABS
25.0000 mg | ORAL_TABLET | Freq: Once | ORAL | Status: AC
  Administered 2021-03-07: 25 mg via ORAL
  Filled 2021-03-07: qty 1

## 2021-03-07 MED ORDER — SIMVASTATIN 20 MG PO TABS
40.0000 mg | ORAL_TABLET | Freq: Every day | ORAL | Status: DC
  Administered 2021-03-08 – 2021-03-09 (×2): 40 mg via ORAL
  Filled 2021-03-07 (×2): qty 2

## 2021-03-07 MED ORDER — LEVOTHYROXINE SODIUM 112 MCG PO TABS
112.0000 ug | ORAL_TABLET | Freq: Every day | ORAL | Status: DC
  Administered 2021-03-08 – 2021-03-10 (×3): 112 ug via ORAL
  Filled 2021-03-07 (×3): qty 1

## 2021-03-07 MED ORDER — ASPIRIN 81 MG PO CHEW
81.0000 mg | CHEWABLE_TABLET | Freq: Every day | ORAL | Status: DC
  Administered 2021-03-07 – 2021-03-10 (×4): 81 mg via ORAL
  Filled 2021-03-07 (×4): qty 1

## 2021-03-07 MED ORDER — ACETAMINOPHEN 325 MG PO TABS
325.0000 mg | ORAL_TABLET | ORAL | Status: DC | PRN
  Administered 2021-03-09 – 2021-03-10 (×2): 650 mg via ORAL
  Filled 2021-03-07 (×2): qty 2

## 2021-03-07 MED ORDER — CEPHALEXIN 500 MG PO CAPS
500.0000 mg | ORAL_CAPSULE | Freq: Four times a day (QID) | ORAL | Status: DC
  Administered 2021-03-07 – 2021-03-10 (×8): 500 mg via ORAL
  Filled 2021-03-07 (×10): qty 1

## 2021-03-07 MED ORDER — POTASSIUM CHLORIDE CRYS ER 20 MEQ PO TBCR
20.0000 meq | EXTENDED_RELEASE_TABLET | Freq: Every day | ORAL | Status: DC
  Administered 2021-03-07 – 2021-03-10 (×4): 20 meq via ORAL
  Filled 2021-03-07 (×4): qty 1

## 2021-03-07 MED ORDER — BUPROPION HCL ER (SR) 150 MG PO TB12
150.0000 mg | ORAL_TABLET | Freq: Two times a day (BID) | ORAL | Status: DC
  Administered 2021-03-08 – 2021-03-10 (×5): 150 mg via ORAL
  Filled 2021-03-07 (×6): qty 1

## 2021-03-07 MED ORDER — TAMSULOSIN HCL 0.4 MG PO CAPS
0.4000 mg | ORAL_CAPSULE | Freq: Every day | ORAL | Status: DC
  Administered 2021-03-07 – 2021-03-09 (×3): 0.4 mg via ORAL
  Filled 2021-03-07 (×3): qty 1

## 2021-03-07 MED ORDER — ACETAMINOPHEN 500 MG PO TABS
1000.0000 mg | ORAL_TABLET | Freq: Once | ORAL | Status: AC
  Administered 2021-03-08: 1000 mg via ORAL
  Filled 2021-03-07 (×2): qty 2

## 2021-03-07 NOTE — ED Notes (Signed)
Pt in CT.

## 2021-03-07 NOTE — ED Provider Notes (Signed)
[name] Provider Note   CSN: [id number] Arrival date & time: 03/07/21  [id number]     History Chief Complaint  Patient presents with   Sue [name]    Sue [name] is a 87 y.o. female.  Sue NeighborJocelyn J [name] is a 87 y.o. female with hx hyperlipidemia, DM, CHF, HTN, stroke, who presents to the emergency department via EMS for evaluation after fall.  Patient reports she was trying to get out from bed with her walker to use the bathroom, got up and took a few steps but then her leg started to give out and she fell landing on her back.  She is not sure if she hit her head but denies any headache, vision changes or vomiting.  Initially denies pain but then reports some pain throughout her back, in particular in her low back.  No pain over her chest, or abdomen.  No chest pain or shortness of breath preceding fall.  Was not feeling lightheaded.  Reports she has had progressive Lee worsening issues with walking and weakness and feeling a bit wobbly and unsteady recently.  She does report some numbness in her feet bilaterally which she thinks is new today.  Reports she lives at home with her son who helps to care for her but she has been dealing with progressive worsening of her mobility over the past few months.  Has had some previous falls.  No fevers or chills.  No other aggravating relieving factors.  The history is provided by the patient.      Past Medical History:  Diagnosis Date   CHF (congestive heart failure) (HCC)    Diabetes mellitus without complication (HCC)    Hyperlipidemia    Thyroid disease     Patient Active Problem List   Diagnosis Date Noted   Hypokalemia 02/26/2021   Generalized weakness 02/26/2021   UTI (urinary tract infection) 02/23/2021   Urinary retention 02/23/2021   Embolic stroke (HCC) 02/01/2021   Acute ischemic stroke (HCC)    Leukocytosis    Dyslipidemia    Diabetes mellitus type 2 in obese (HCC)    Congestive heart failure (HCC)     Stroke (cerebrum) (HCC) 01/25/2021   Acute on chronic diastolic CHF (congestive heart failure) (HCC) 01/10/2021   Acute respiratory failure with hypoxemia (HCC) 01/10/2021   Pneumonia 01/09/2021   Osteoarthritis of ankle and foot 01/09/2021   Chronic diastolic CHF (congestive heart failure) (HCC) 01/09/2021   Chronic fatigue syndrome 01/09/2021   Hypertension associated with diabetes (HCC) 01/09/2021   Hyperlipidemia associated with type 2 diabetes mellitus (HCC) 01/09/2021   Hypothyroidism 01/09/2021   Diabetes (HCC) 04/14/2020    Past Surgical History:  Procedure Laterality Date   LOOP RECORDER INSERTION N/A 01/28/2021   Procedure: LOOP RECORDER INSERTION;  Surgeon: Regan Lemmingamnitz, Will Martin, MD;  Location: [facility];  Service: Cardiovascular;  Laterality: N/A;     OB History   No obstetric history on file.     Family History  Problem Relation Age of Onset   Diabetes Mother     Social History   Tobacco Use   Smoking status: Former    Pack years: 0.00    Types: Cigarettes    Quit date: 09/06/1958    Years since quitting: 62.5   Smokeless tobacco: Never  Vaping Use   Vaping Use: Never used  Substance Use Topics   Alcohol use: No   Drug use: No    Home Medications Prior to Admission medications  Medication Sig Start Date End Date Taking? Authorizing Provider  acetaminophen (TYLENOL) 325 MG tablet Take 1-2 tablets (325-650 mg total) by mouth every 4 (four) hours as needed for mild pain. 02/23/21   Love, Evlyn Kanner, PA-C  aspirin 81 MG chewable tablet Chew 1 tablet (81 mg total) by mouth daily. 02/23/21   Love, Evlyn Kanner, PA-C  buPROPion (WELLBUTRIN SR) 150 MG 12 hr tablet Take 1 tablet (150 mg total) by mouth 2 (two) times daily. 02/23/21   Love, Evlyn Kanner, PA-C  carvedilol (COREG) 3.125 MG tablet Take 1 tablet (3.125 mg total) by mouth 2 (two) times daily with a meal. 02/23/21   Love, Evlyn Kanner, PA-C  cephALEXin (KEFLEX) 500 MG capsule Take 1 capsule (500 mg total) by  mouth 4 (four) times daily for 7 days. 03/06/21 03/13/21  Amin, Loura Halt, MD  furosemide (LASIX) 40 MG tablet Take 1 tablet (40 mg total) by mouth 2 (two) times daily. 02/23/21   Love, Evlyn Kanner, PA-C  levothyroxine (SYNTHROID) 112 MCG tablet Take 1 tablet (112 mcg total) by mouth daily before breakfast. 02/23/21   Love, Evlyn Kanner, PA-C  nitrofurantoin, macrocrystal-monohydrate, (MACROBID) 100 MG capsule Take 1 capsule (100 mg total) by mouth every 12 (twelve) hours. 02/23/21   Love, Evlyn Kanner, PA-C  polyethylene glycol (MIRALAX / GLYCOLAX) 17 g packet Take 17 g by mouth daily. 02/23/21   Love, Evlyn Kanner, PA-C  potassium chloride SA (KLOR-CON) 20 MEQ tablet Take 1 tablet (20 mEq total) by mouth daily. 02/23/21   Love, Evlyn Kanner, PA-C  simvastatin (ZOCOR) 40 MG tablet Take 1 tablet (40 mg total) by mouth daily at 6 PM. 02/23/21   Love, Evlyn Kanner, PA-C  tamsulosin (FLOMAX) 0.4 MG CAPS capsule Take 1 capsule (0.4 mg total) by mouth daily after supper. 03/06/21   Amin, Loura Halt, MD  QUEtiapine (SEROQUEL) 25 MG tablet Take 1 tablet (25 mg total) by mouth at bedtime. Patient not taking: No sig reported 02/23/21 02/26/21  Jacquelynn Cree, PA-C    Allergies    Patient has no known allergies.  Review of Systems   Review of Systems  Constitutional:  Negative for chills and fever.  Respiratory:  Negative for cough and shortness of breath.   Cardiovascular:  Negative for chest pain.  Gastrointestinal:  Negative for abdominal pain, nausea and vomiting.  Musculoskeletal:  Positive for back pain. Negative for arthralgias and myalgias.  Skin:  Negative for color change and rash.  Neurological:  Positive for weakness and numbness. Negative for dizziness, syncope, speech difficulty and headaches.  All other systems reviewed and are negative.  Physical Exam Updated Vital Signs BP (!) 149/87   Pulse 82   Temp (!) 97.5 F (36.4 C) (Oral)   Resp 18   Ht 5\' 4"  (1.626 m)   Wt 79.5 kg   SpO2 93%   BMI 30.08 kg/m    Physical Exam Vitals and nursing note reviewed.  Constitutional:      General: She is not in acute distress.    Appearance: Normal appearance. She is well-developed and normal weight. She is not ill-appearing or diaphoretic.  HENT:     Head: Normocephalic and atraumatic.     Comments: No hematoma, step-off or deformity noted. Eyes:     General:        Right eye: No discharge.        Left eye: No discharge.     Pupils: Pupils are equal, round, and reactive to light.  Neck:     Comments: No tenderness or step-off noted over the C-spine. Cardiovascular:     Rate and Rhythm: Normal rate and regular rhythm.     Pulses: Normal pulses.     Heart sounds: Normal heart sounds.  Pulmonary:     Effort: Pulmonary effort is normal. No respiratory distress.     Breath sounds: Normal breath sounds. No wheezing or rales.     Comments: Respirations equal and unlabored, patient able to speak in full sentences, lungs clear to auscultation bilaterally  Abdominal:     General: Bowel sounds are normal. There is no distension.     Palpations: Abdomen is soft. There is no mass.     Tenderness: There is no abdominal tenderness. There is no guarding.     Comments: Abdomen soft, nondistended, nontender to palpation in all quadrants without guarding or peritoneal signs  Musculoskeletal:        General: Tenderness present. No deformity.     Cervical back: Neck supple.     Comments: There is diffuse tenderness noted throughout the thoracic and lumbar spine without palpable step-off or deformity.  No tenderness or instability over the hips, no shortening or rotation, no pain or tenderness over the extremities. All joints supple and easily movable, all compartments soft.  Skin:    General: Skin is warm and dry.     Capillary Refill: Capillary refill takes less than 2 seconds.  Neurological:     Mental Status: She is alert and oriented to person, place, and time.     Coordination: Coordination normal.      Comments: Speech is clear, able to follow commands CN III-XII intact Normal strength in upper and lower extremities bilaterally including dorsiflexion and plantar flexion, strong and equal grip strength Sensation intact in bilateral upper extremities, and lower extremities patient reports decreased sensation in the shin and feet compared to her normal bilaterally. Moves extremities without ataxia, coordination intact  Psychiatric:        Mood and Affect: Mood normal.        Behavior: Behavior normal.    ED Results / Procedures / Treatments   Labs (all labs ordered are listed, but only abnormal results are displayed) Labs Reviewed  CBC WITH DIFFERENTIAL/PLATELET - Abnormal; Notable for the following components:      Result Value   Hemoglobin 15.6 (*)    HCT 48.0 (*)    Platelets 512 (*)    All other components within normal limits  BASIC METABOLIC PANEL  URINALYSIS, ROUTINE W REFLEX MICROSCOPIC    EKG None  Radiology CT Head Wo Contrast  Result Date: 03/07/2021 CLINICAL DATA:  87 year old female status post fall from bed onto floor. History of right PCA and deep gray nuclei infarcts in May. EXAM: CT HEAD WITHOUT CONTRAST TECHNIQUE: Contiguous axial images were obtained from the base of the skull through the vertex without intravenous contrast. COMPARISON:  Brain MRI 01/26/2021 and earlier. FINDINGS: Brain: Developing encephalomalacia in the right PCA territory, and heterogeneity in the right deep gray matter nuclei corresponding to the May infarcts. No acute intracranial hemorrhage identified. No midline shift, mass effect, or evidence of intracranial mass lesion. No ventriculomegaly. Stable patchy white matter hypodensity elsewhere. No new cortically based infarct. Vascular: Mild Calcified atherosclerosis at the skull base. No suspicious intracranial vascular hyperdensity. Skull: Stable and intact. Sinuses/Orbits: Visualized paranasal sinuses and mastoids are stable and well aerated.  Other: No orbit or scalp soft tissue injury. IMPRESSION: 1. No acute intracranial abnormality.  No acute traumatic injury identified. 2. Expected evolution of the right PCA and right deep gray matter infarcts since the May. Electronically Signed   By: Odessa Fleming M.D.   On: 03/07/2021 11:25   CT Cervical Spine Wo Contrast  Result Date: 03/07/2021 CLINICAL DATA:  87 year old female status post fall from bed onto floor. EXAM: CT CERVICAL SPINE WITHOUT CONTRAST TECHNIQUE: Multidetector CT imaging of the cervical spine was performed without intravenous contrast. Multiplanar CT image reconstructions were also generated. COMPARISON:  CTA head and neck 01/25/2021. FINDINGS: Alignment: Stable since May. Mild reversal of cervical lordosis. Cervicothoracic junction alignment is within normal limits. Bilateral posterior element alignment is within normal limits. Skull base and vertebrae: Osteopenia. Visualized skull base is intact. No atlanto-occipital dissociation. C1 and C2 appear intact and aligned. No acute osseous abnormality identified. Soft tissues and spinal canal: No prevertebral fluid or swelling. No visible canal hematoma. Tortuosity of the left vertebral artery in the left C5 neural foramen. Negative visible noncontrast neck soft tissues. Disc levels: Chronic disc and endplate degeneration. Evidence of mild chronic degenerative spinal stenosis at C5-C6. No other significant spinal stenosis. Upper chest: Visible upper thoracic levels appear stable. Clear lung apices. IMPRESSION: 1. No acute traumatic injury identified in the cervical spine. 2. Osteopenia and mild C5-C6 degenerative spinal stenosis. Electronically Signed   By: Odessa Fleming M.D.   On: 03/07/2021 11:30   CT Thoracic Spine Wo Contrast  Result Date: 03/07/2021 CLINICAL DATA:  87 year old female status post fall from bed onto floor. EXAM: CT THORACIC SPINE WITHOUT CONTRAST TECHNIQUE: Multidetector CT images of the thoracic were obtained using the standard  protocol without intravenous contrast. COMPARISON:  Neck 01/25/2021. Cervical spine CT today. CTA chest radiographs 06/02/2018. FINDINGS: Limited cervical spine imaging:  Reported separately today. Thoracic spine segmentation:  Appears normal. Alignment: Moderate dextroconvex thoracic scoliosis. Stable thoracic kyphosis since 2019. Vertebrae: Osteopenia. The upper thoracic levels T1 through T4 appears stable from the May CTA. T5 and T6 appear intact. There is a mild T7 superior endplate compression fracture which is age indeterminate (series 9, image 23. Suspicious linear lucency along the left lateral superior endplate. No retropulsion of bone. T7 pedicles and posterior elements appear intact. T8 through T12 appear intact. Visible posterior ribs appear intact. Paraspinal and other soft tissues: Calcified aortic atherosclerosis. No pericardial or pleural effusion. Mild perihilar and dependent atelectasis. Negative visible noncontrast upper abdominal viscera. Thoracic paraspinal soft tissues remain normal. Disc levels: Generally mild for age thoracic spine degeneration. Capacious thoracic spinal canal. There is moderate to severe left side lower thoracic facet hypertrophy at T11-T12 and T12-L1. Associated severe left T11 foraminal stenosis. IMPRESSION: 1. Osteopenia with mild T7 superior endplate compression fracture, age indeterminate but suspicious for acute injury in this setting. If specific therapy such as vertebroplasty is desired, Thoracic MRI without contrast or Nuclear Medicine Whole-body Bone Scan would best determine acuity. 2. No other acute osseous abnormality in the thoracic spine. Generally mild for age thoracic spine degeneration. 3. Aortic Atherosclerosis (ICD10-I70.0). Electronically Signed   By: Odessa Fleming M.D.   On: 03/07/2021 11:36   CT Lumbar Spine Wo Contrast  Result Date: 03/07/2021 CLINICAL DATA:  87 year old female status post fall from bed onto floor. EXAM: CT LUMBAR SPINE WITHOUT CONTRAST  TECHNIQUE: Multidetector CT imaging of the lumbar spine was performed without intravenous contrast administration. Multiplanar CT image reconstructions were also generated. COMPARISON:  Thoracic spine CT today reported separately. FINDINGS: Segmentation: Normal, concordant with the thoracic numbering today. Alignment: Moderate levoconvex  lumbar scoliosis. Mild straightening of lumbar lordosis. Vertebrae: Osteopenia. Degenerative sclerosis along the endplates of L2 through L5. The T12 level appears intact. Mild L1 superior endplate compression is superimposed on a small Schmorl's node and is age indeterminate. The L1 pedicles and posterior elements appear intact. Other lumbar vertebrae appear intact. Intact visible sacrum and SI joints. Paraspinal and other soft tissues: Calcified aortic atherosclerosis. Exophytic left lower pole renal cyst with simple fluid density. Otherwise negative visible abdominal viscera. Negative lumbar paraspinal soft tissues. Disc levels: Capacious lumbar spinal canal with no spinal stenosis despite advanced disc degeneration with vacuum disc and endplate spurring except at L1. Comparatively mild lumbar facet degeneration. IMPRESSION: 1. Osteopenia. Mild L1 superior endplate compression fracture is age indeterminate. If specific therapy such as vertebroplasty is desired, Lumbar MRI or Nuclear Medicine Whole-body Bone Scan would best determine acuity. 2. No other acute osseous abnormality in the lumbar spine. 3. Moderate levoconvex lumbar scoliosis with advanced disc and endplate degeneration, but no spinal stenosis. 4. Aortic Atherosclerosis (ICD10-I70.0). Electronically Signed   By: Odessa Fleming M.D.   On: 03/07/2021 11:41    Procedures Procedures   Medications Ordered in ED Medications  acetaminophen (TYLENOL) tablet 1,000 mg (1,000 mg Oral Patient Refused/Not Given 03/07/21 1039)    ED Course  I have reviewed the triage vital signs and the nursing notes.  Pertinent labs & imaging  results that were available during my care of the patient were reviewed by me and considered in my medical decision making (see chart for details).    MDM Rules/Calculators/A&P                         87 y.o. female presents to the ED for ration after a fall.  Reportedly was trying to get up from bed this morning, was able to take a step or 2 with her walker and then her legs gave out and she fell to the ground landing on her back, is unsure if she hit her head.  Reports her legs have been feeling weak and she also noticed some numbness and thinks this numbness in her lower extremities bilaterally may be new since the fall.  Her only complaint since the fall is some pain in her back.  She has diffuse tenderness throughout the thoracic and lumbar spine.  We will get CTs of the head, cervical spine, thoracic and lumbar spine.  We will also check basic labs and urinalysis.  Patient has been having issues with intermittent urinary retention.  Was recently admitted and just discharged yesterday home with home health.  On arrival pt is nontoxic, vitals are unremarkable aside from mild hypertension  Additional history obtained from medical records. Previous records obtained and reviewed via EMR.  Attempted to call patient's son multiple times to obtain additional history about patient's fall today, but he has not answered the phone or return my calls.  Lab Tests:  I Ordered, reviewed, and interpreted labs, which included:  CBC: No leukocytosis, hemoglobin slightly elevated at 15.6 BMP: Glucose 114, no other electrolyte derangements, normal renal function UA: Trace leukocytes, rare bacteria, significantly improved from urinalysis during recent admission.  Imaging Studies ordered:  I ordered imaging studies which included CT of the head, cervical spine, thoracic spine, lumbar spine, I independently visualized and interpreted imaging which showed osteopenia with mild T7 superior endplate compression fracture  and L1 superior endplate compression fracture, both age-indeterminate. No acute abnormalities noted in the head or cervical spine.  Discussed compression fractures with attending Dr. Rhunette Croft, who has seen an examined patient, pt reports some decrease sensation but on exam intact in bilateral lower extremities with normal proprioception and temperature.  She has some chronic intermittent urinary retention, but has normal rectal tone and normal strength and reflexes in bilateral lower extremity.  Low suspicion for acute cord compromise in the setting of compression fractures.  Dr. Rhunette Croft does not recommend MRIs, or further emergent evaluation of compression fractures  ED Course:   Patient with age-indeterminate compression fractures has been having multiple recent falls lately.  Was just discharged home from the hospital yesterday after admission for generalized weakness with intermittent urinary retention and UTIs.  Was discharged home with home health care but I doubt that they had already been out to the house today and patient had another fall.  On today's evaluation aside from compression fractures her work-up is unremarkable, lab work looks good and urine is now clear.  No clear indication for readmission to the hospital.  Attempted to have patient ambulate with walker and she was unable to even take a few steps, during PT eval yesterday it is reported that she was able to walk slowly with a walker about 20 feet.  Due to patient's worsening mobility do not feel that she is safe to go home, in particular because I suspect home health will not be out until after the holiday weekend.  I have attempted to reach patient's son Etter Royall whom she lives with multiple times and left voicemail for him but have not gotten a call back.  At this point feel patient will need to board in the ED and will likely need placement due to worsening mobility.  Transitions of care consult and PT evaluation ordered,  and discussed with Child psychotherapist.  She has tried to reach patient's son multiple times as well.  Patient's home medications ordered, she also has orders for bladder scan and straight cath as needed due to history of intermittent urinary retention.  Diet ordered as well.  PT to evaluate and treat tomorrow and social work will work on placement.  Portions of this note were generated with Scientist, clinical (histocompatibility and immunogenetics). Dictation errors may occur despite best attempts at proofreading.   Final Clinical Impression(s) / ED Diagnoses Final diagnoses:  Fall, initial encounter  Compression fracture of T7 vertebra, initial encounter (HCC)  Compression fracture of L1 lumbar vertebra, closed, initial encounter [facility])  Decreased mobility    Rx / DC Orders ED Discharge Orders     None        Legrand Rams 03/07/21 2028    Derwood Kaplan, MD 03/09/21 [id number]

## 2021-03-07 NOTE — Social Work (Signed)
Unable to reach son, Virgel Bouquet, with whom Pt lives via telephone.  Per chart review located son, Nathaneil Canary @ [phone] in an effort to contact [facility] for collateral.  Nathaneil Canary stated that he would attempt to contact [facility].  With Pt's permission, Nathaneil Canary' number was added to demographics.  CSW met with Pt at bedside. Pt states that she is very anxious and nervous and requested "a pill to help me to calm down". Pt reports that she is not prescribed any medication for anxiety, but wishes to have medication at this time. Pt also reports being very hungry. Charge RN notified, diet order placed. CSW gave Pt cheese and crackers and ice water.

## 2021-03-07 NOTE — ED Notes (Signed)
Pt repositioned in bed - pt eating supper at this time

## 2021-03-07 NOTE — Care Management (Signed)
Is active (new) with [facility] home Health for PT and OT.

## 2021-03-07 NOTE — ED Notes (Signed)
Nurse entered room to give pt nightime dose of medications, pt said "get out, I don't want to take anything. I just want to sleep."

## 2021-03-07 NOTE — ED Notes (Signed)
Pt was unable to take a step forward to walk

## 2021-03-07 NOTE — ED Notes (Signed)
Patient transported to CT 

## 2021-03-07 NOTE — ED Triage Notes (Signed)
Ems brings pt in from home for a fall. States pt had a fall from the bed to the carpet floor. Pt denies any pain.

## 2021-03-08 MED ORDER — HYDROXYZINE HCL 50 MG/ML IM SOLN
25.0000 mg | Freq: Once | INTRAMUSCULAR | Status: AC
  Administered 2021-03-08: 25 mg via INTRAMUSCULAR
  Filled 2021-03-08: qty 1

## 2021-03-08 NOTE — Evaluation (Signed)
Physical Therapy Evaluation Patient Details Name: Sue [name] MRN: [id number] DOB: 10/24/1929 Today's Date: 03/08/2021   History of Present Illness  87 yo female admitted with weakness after sustaining a fall at home. Hx of CVA, hearing loss, CHF, DM, depression  Clinical Impression  On eval in ED, pt required Min A for mobility. She walked ~25 feet around the room with a RW. Pt presents with general weakness, decreased activity tolerance, and impaired gait and balance. No family present during session. Recommend ST rehab at SNF if pt/family are agreeable. If refuse placement, recommend HHPT and 24/7 supervision/assist.    Follow Up Recommendations SNF (unless pt will have 24/7 supervision/assist in the home)    Equipment Recommendations  None recommended by PT    Recommendations for Other Services       Precautions / Restrictions Precautions Precautions: Fall Restrictions Weight Bearing Restrictions: No      Mobility  Bed Mobility Overal bed mobility: Needs Assistance Bed Mobility: Sit to Supine       Sit to supine: Mod assist   General bed mobility comments: Assist for bil LEs onto stretches. Cues required.    Transfers Overall transfer level: Needs assistance Equipment used: Rolling walker (2 wheeled) Transfers: Sit to/from Stand Sit to Stand: Min assist         General transfer comment: Assist to rise, steady, control descent Cues for safety, technique.  Ambulation/Gait Ambulation/Gait assistance: Min assist Gait Distance (Feet): 25 Feet Assistive device: Rolling walker (2 wheeled) Gait Pattern/deviations: Step-through pattern;Decreased stride length;Trunk flexed     General Gait Details: Assist to stabilize throughout short distance. Dyspnea 2/4. Cues for safety, RW proximity  Stairs            Wheelchair Mobility    Modified Rankin (Stroke Patients Only)       Balance Overall balance assessment: Needs assistance;History of Falls          Standing balance support: Bilateral upper extremity supported Standing balance-Leahy Scale: Poor                               Pertinent Vitals/Pain Pain Assessment: Faces Faces Pain Scale: No hurt    Home Living Family/patient expects to be discharged to:: Unsure Living Arrangements: Children Available Help at Discharge:  (unsure) Type of Home: Apartment Home Access: Level entry     Home Layout: One level Home Equipment: Walker - 2 wheels;Shower seat Additional Comments: info taken from prior notes    Prior Function Level of Independence: Needs assistance   Gait / Transfers Assistance Needed: pt reports amb with rollator           Hand Dominance        Extremity/Trunk Assessment   Upper Extremity Assessment Upper Extremity Assessment: Defer to OT evaluation    Lower Extremity Assessment Lower Extremity Assessment: Generalized weakness    Cervical / Trunk Assessment Cervical / Trunk Assessment: Normal  Communication   Communication: HOH  Cognition Arousal/Alertness: Awake/alert Behavior During Therapy: WFL for tasks assessed/performed Overall Cognitive Status: No family/caregiver present to determine baseline cognitive functioning                                        General Comments      Exercises     Assessment/Plan    PT Assessment Patient needs continued PT  services  PT Problem List Decreased strength;Decreased mobility;Decreased activity tolerance;Decreased balance;Decreased knowledge of use of DME;Decreased safety awareness       PT Treatment Interventions DME instruction;Gait training;Therapeutic activities;Therapeutic exercise;Patient/family education;Balance training;Functional mobility training    PT Goals (Current goals can be found in the Care Plan section)  Acute Rehab PT Goals Patient Stated Goal: none stated PT Goal Formulation: With patient Time For Goal Achievement: 03/22/21 Potential to  Achieve Goals: Good    Frequency Min 2X/week   Barriers to discharge        Co-evaluation               AM-PAC PT "6 Clicks" Mobility  Outcome Measure Help needed turning from your back to your side while in a flat bed without using bedrails?: A Lot Help needed moving from lying on your back to sitting on the side of a flat bed without using bedrails?: A Lot Help needed moving to and from a bed to a chair (including a wheelchair)?: A Little Help needed standing up from a chair using your arms (e.g., wheelchair or bedside chair)?: A Little Help needed to walk in hospital room?: A Little Help needed climbing 3-5 steps with a railing? : A Lot 6 Click Score: 15    End of Session   Activity Tolerance: Patient tolerated treatment well Patient left: in bed;with call bell/phone within reach;with bed alarm set   PT Visit Diagnosis: History of falling (Z91.81);Muscle weakness (generalized) (M62.81)    Time: 7092-9574 PT Time Calculation (min) (ACUTE ONLY): 8 min   Charges:   PT Evaluation $PT Eval Moderate Complexity: 1 Mod             Faye Ramsay, PT Acute Rehabilitation  Office: [phone] Pager: [phone]

## 2021-03-08 NOTE — ED Notes (Signed)
Pt pleasant and willing to take all medications.  Pt informed we are still unable to contact son.

## 2021-03-08 NOTE — ED Notes (Addendum)
Staff attempted to bladder scan pt. Pt protested and said she "just wanted to sleep." She demanded that staff leave. Provider notified.

## 2021-03-08 NOTE — ED Notes (Signed)
Patient attempting to get out of bed. Agitated. Linton Flemings messaged for medication orders.

## 2021-03-08 NOTE — ED Notes (Signed)
Pt assisted to bedside commode with moderate assistance.  No updates have been provided by TOC.

## 2021-03-08 NOTE — Progress Notes (Signed)
TOC CSW contacted [employer] to do a well check on Sue [name]/pts son.  Dispatch informed CSW that she will be contacted once the officer makes contact.  CSW also provided contact information to dispatch.  Sue [name], MSW, LCSW-A Pronouns:  She, Her, Hers                  Sue [name] ED Transitions of CareClinical Social Worker [name] [phone]

## 2021-03-08 NOTE — Progress Notes (Signed)
TOC CSW has attempted to contact both Thermon Leyland and Wynn Banker, but no success.  CSW left HIPPA compliant message with my contact information.   Song Myre Tarpley-Carter, MSW, LCSW-A Pronouns:  She, Her, Hers                  [facility] ED Transitions of CareClinical Social Worker [email or web address] [phone]

## 2021-03-08 NOTE — Progress Notes (Signed)
TOC CSW was contacted by [employer].  Officer Anola Gurney stated no one was at the residence, no one came to the door.  Officer Eder left a note on the door with CSW's information to contact.  Sue [name], MSW, LCSW-A Pronouns:  She, Her, Hers                  Sue [name] ED Transitions of CareClinical Social Worker [name] [phone]

## 2021-03-08 NOTE — ED Notes (Signed)
Pt refused the bladder scan order. She said that she is too tired and to leave her alone.

## 2021-03-08 NOTE — ED Notes (Signed)
Per chart review, PT is recommending SNF placement and TOC has been unable to contact Pt's son.  [employer] performed a wellness class and no one was at her residence.

## 2021-03-09 LAB — CUP PACEART REMOTE DEVICE CHECK
Date Time Interrogation Session: 20220701162913
Implantable Pulse Generator Implant Date: 20220525

## 2021-03-09 NOTE — Progress Notes (Signed)
..  Transition of Care [facility]) - Emergency Department Mini Assessment   Patient Details  Name: Sue [name] MRN: [id number] Date of Birth: [birth year]  [facility] CM/SW Contact:    Larrie Kass, LCSW Phone Number: 03/09/2021, 9:56 AM   Clinical Narrative: CSW spoke with pt at bedside about recommendations to SNF for PT and OT. Pt did agree with short term rehab placement. CSW spoke with pt son [name] ([phone]), whom also agreed to SNF placement for his mother. CSW provided choice to family and pt, family and pt did not have any preference. CSW will submit FL2 and work on authorizations.    ED Mini Assessment: What brought you to the Emergency Department? : Fall at home  Barriers to Discharge: SNF Pending bed offer             Patient Contact and Communications        ,                 Admission diagnosis:  fall Patient Active Problem List   Diagnosis Date Noted   Hypokalemia 02/26/2021   Generalized weakness 02/26/2021   UTI (urinary tract infection) 02/23/2021   Urinary retention 02/23/2021   Embolic stroke (HCC) 02/01/2021   Acute ischemic stroke (HCC)    Leukocytosis    Dyslipidemia    Diabetes mellitus type 2 in obese (HCC)    Congestive heart failure (HCC)    Stroke (cerebrum) (HCC) 01/25/2021   Acute on chronic diastolic CHF (congestive heart failure) (HCC) 01/10/2021   Acute respiratory failure with hypoxemia (HCC) 01/10/2021   Pneumonia 01/09/2021   Osteoarthritis of ankle and foot 01/09/2021   Chronic diastolic CHF (congestive heart failure) (HCC) 01/09/2021   Chronic fatigue syndrome 01/09/2021   Hypertension associated with diabetes (HCC) 01/09/2021   Hyperlipidemia associated with type 2 diabetes mellitus (HCC) 01/09/2021   Hypothyroidism 01/09/2021   Diabetes (HCC) 04/14/2020   PCP:  Sigmund Hazel, MD Pharmacy:   [facility] [id number] [place], [place] - [address] [place] [address] Phone: [phone] Fax: [phone]

## 2021-03-09 NOTE — Progress Notes (Addendum)
11:00  CSW submitted referrals to SNF and started insurance Auth.      1:29 CSW received 7 day Auth approval [id number] and PTAR [phone] from [employer].  CSW spoke with pt and family about bed offers, patient's son Carlena [phone]) reported giving CSW a call back about the decision on what facility they prefer.   Valentina Shaggy.Arlean Thies, MSW, LCSWA [facility]  Transitions of Care Clinical Social Worker I Direct Dial: [phone]  Fax: [phone] [email or web address]

## 2021-03-09 NOTE — Evaluation (Signed)
Occupational Therapy Evaluation Patient Details Name: Sue [name] MRN: [id number] DOB: [birth year] Today's Date: 03/09/2021    History of Present Illness 87 yo female admitted on 7/02/22with weakness after sustaining a fall at home. Pt had just discharged home from hospital on 03/06/21. Hx of CVA, hearing loss, CHF, DM, depression   Clinical Impression   Patient is currently requiring assistance with ADLs including minimal to moderate assist with toileting, moderate to maximum assist with LE dressing, minimal to moderate assist with bathing, and setup/supervision to min guard assist with seated UE dressing and grooming, all of which is below patient's typical baseline prior to her last very recent hospitalization, however pt was home for only 1 day before having another fall and returning to hospital.  During this evaluation, patient was limited by fatigue, generalized weakness, limited activity tolerance and blood pressure changes as recored in Flowsheet of: Supine: 99/87; EOB: 100/72; Standing 87/60 with symptomatic dizziness, all of which has the potential to impact patient's safety and independence during functional mobility, as well as performance for ADLs. [employer] AM-PAC "6-clicks" Daily Activity Inpatient Short Form score of 18/24 this session. Patient lives with her son, who is able to provide "nearly" 24/7 supervision and assistance, however pt is an unreliable historian at this time.  Patient demonstrates fair rehab potential, and should benefit from continued skilled occupational therapy services while in acute care to maximize safety, independence and quality of life at home.  Continued occupational therapy services in a SNF setting prior to return home is recommended.  ?   Follow Up Recommendations  SNF    Equipment Recommendations  3 in 1 bedside commode    Recommendations for Other Services       Precautions / Restrictions Precautions Precautions: Fall Precaution  Comments: pt reports 2 falls in past 1 year, now admitted with 3rd fall. Mon BP Restrictions Weight Bearing Restrictions: No      Mobility Bed Mobility Overal bed mobility: Needs Assistance Bed Mobility: Sit to Supine;Supine to Sit     Supine to sit: HOB elevated;Mod assist Sit to supine: Mod assist   General bed mobility comments: Assist for bil LEs onto stretcher. Cues required.    Transfers Overall transfer level: Needs assistance Equipment used: Rolling walker (2 wheeled) Transfers: Sit to/from Stand Sit to Stand: Min assist         General transfer comment: Pt required increased assistance to maximum to scoot to edge of stretcher. Stood from high surface with Min As. Pt leaning to LT ontotherapist for balance while taking standing BP.    Balance Overall balance assessment: Needs assistance;History of Falls Sitting-balance support: Single extremity supported;Feet unsupported Sitting balance-Leahy Scale: Fair     Standing balance support: Bilateral upper extremity supported Standing balance-Leahy Scale: Poor Standing balance comment: reliant on UEs, leans to LT onto therapist for support.                           ADL either performed or assessed with clinical judgement   ADL Overall ADL's : Needs assistance/impaired Eating/Feeding: Set up   Grooming: Wash/dry face;Bed level;Set up;Supervision/safety   Upper Body Bathing: Minimal assistance;Sitting   Lower Body Bathing: Moderate assistance;Sitting/lateral leans   Upper Body Dressing : Sitting;Min guard   Lower Body Dressing: Moderate assistance;Sitting/lateral leans   Toilet Transfer: Stand-pivot;Grab bars;Ambulation;Minimal assistance;+2 for physical assistance Toilet Transfer Details (indicate cue type and reason): with rolling walker. Toileting- Clothing Manipulation and Hygiene: Sitting/lateral lean;Minimal  assistance Toileting - Clothing Manipulation Details (indicate cue type and reason):  Currently on Pure wick     Functional mobility during ADLs: Moderate assistance;Rolling walker General ADL Comments: with rolling walker.     Vision Baseline Vision/History: Wears glasses Wears Glasses: Reading only Patient Visual Report: No change from baseline Vision Assessment?: No apparent visual deficits     Perception     Praxis      Pertinent Vitals/Pain Pain Assessment: Faces Faces Pain Scale: Hurts little more Pain Location: back once sitting at edge of stretcher Pain Descriptors / Indicators: Sore;Tightness Pain Intervention(s): Limited activity within patient's tolerance;Monitored during session;Repositioned     Hand Dominance Right   Extremity/Trunk Assessment Upper Extremity Assessment Upper Extremity Assessment: Generalized weakness   Lower Extremity Assessment Lower Extremity Assessment: Generalized weakness   Cervical / Trunk Assessment Cervical / Trunk Assessment: Normal   Communication Communication Communication: Expressive difficulties;HOH (Speech intermittently slurs. Spoke with RW about this who reports that she has also observed this.)   Cognition Arousal/Alertness: Awake/alert Behavior During Therapy: Flat affect (Needs encouragement to participate) Overall Cognitive Status: No family/caregiver present to determine baseline cognitive functioning                                 General Comments: Per RN, patient experiences sundowning later in day.   General Comments       Exercises     Shoulder Instructions      Home Living Family/patient expects to be discharged to:: Skilled nursing facility Living Arrangements: Children Available Help at Discharge: Family (pt reports son gives "nearly" 24/7 supervision) Type of Home: Apartment Home Access: Level entry     Home Layout: One level     Bathroom Shower/Tub: Chief Strategy Officer: Standard Bathroom Accessibility: Yes How Accessible: Accessible via  wheelchair Home Equipment: Walker - 2 wheels;Shower seat   Additional Comments: info taken from prior notes  Lives With: Son    Prior Functioning/Environment Level of Independence: Needs assistance  Gait / Transfers Assistance Needed: pt reports amb with rollator ADL's / Homemaking Assistance Needed: patient reported using AE for LB dressing IE sock aid. patient reported needing assistance for LB dressing tasks but later would report being able to complete independently. patient reported being able to complete bathing at sink and toileting tasks MI with walker at home. patient is a questionable historian will need to follow up with family.   Comments: pt is not an accurate historian        OT Problem List: Decreased strength;Decreased activity tolerance;Impaired balance (sitting and/or standing);Decreased safety awareness;Decreased knowledge of use of DME or AE;Decreased knowledge of precautions;Pain      OT Treatment/Interventions: Self-care/ADL training;Balance training;DME and/or AE instruction;Patient/family education;Neuromuscular education;Therapeutic activities    OT Goals(Current goals can be found in the care plan section) Acute Rehab OT Goals Patient Stated Goal: To sleep. OT Goal Formulation: Patient unable to participate in goal setting Time For Goal Achievement: 03/23/21 Potential to Achieve Goals: Fair ADL Goals Pt Will Perform Grooming: standing;with supervision Pt Will Perform Lower Body Dressing: with adaptive equipment;sitting/lateral leans;sit to/from stand;with set-up;with supervision Pt Will Transfer to Toilet: with modified independence;ambulating Pt Will Perform Toileting - Clothing Manipulation and hygiene: with modified independence;sitting/lateral leans;sit to/from stand Pt/caregiver will Perform Home Exercise Program: Increased strength;Both right and left upper extremity;With theraband;With Supervision Additional ADL Goal #1: Patient will identify at least  3 fall prevention strategies to employ  at home in order to maximize function and safety during ADLs and decrease caregiver burden while preventing possible injury and rehospitalization.  OT Frequency: Min 2X/week   Barriers to D/C:            Co-evaluation              AM-PAC OT "6 Clicks" Daily Activity     Outcome Measure Help from another person eating meals?: None Help from another person taking care of personal grooming?: A Little Help from another person toileting, which includes using toliet, bedpan, or urinal?: A Little Help from another person bathing (including washing, rinsing, drying)?: A Little Help from another person to put on and taking off regular upper body clothing?: A Little Help from another person to put on and taking off regular lower body clothing?: A Lot 6 Click Score: 18   End of Session Equipment Utilized During Treatment: Rolling walker Nurse Communication: Mobility status (slurring of speech, BP changes)  Activity Tolerance: Patient tolerated treatment well Patient left: in bed;with call bell/phone within reach;with bed alarm set (staff inroom)  OT Visit Diagnosis: Muscle weakness (generalized) (M62.81);History of falling (Z91.81);Repeated falls (R29.6);Pain Pain - part of body:  (back)                Time: 0981-1914 OT Time Calculation (min): 26 min Charges:  OT General Charges $OT Visit: 1 Visit OT Evaluation $OT Eval Low Complexity: 1 Low OT Treatments $Self Care/Home Management : 8-22 mins  Victorino Dike, OT Acute Rehab Services Office: [phone] 03/09/2021 Theodoro Clock 03/09/2021, 9:33 AM

## 2021-03-09 NOTE — [place] FL2 (Signed)
[place] MEDICAID FL2 LEVEL OF CARE SCREENING TOOL     IDENTIFICATION  Patient Name: Sue [name] Birthdate: [birth year] Sex: female Admission Date (Current Location): 03/07/2021  [facility] and [place] Number:  Producer, television/film/video and Address:  [facility],  [address] [place], [place] [address]      Provider Number: [id number]  Attending Physician Name and Address:  Default, Provider, MD  Relative Name and Phone Number:  Angelique, Chevalier [phone],  Mijangos,Douglass Son     [phone]    Current Level of Care: Hospital Recommended Level of Care: Skilled Nursing Facility Prior Approval Number:    Date Approved/Denied:   PASRR Number: [id number]  Discharge Plan: SNF    Current Diagnoses: Patient Active Problem List   Diagnosis Date Noted   Hypokalemia 02/26/2021   Generalized weakness 02/26/2021   UTI (urinary tract infection) 02/23/2021   Urinary retention 02/23/2021   Embolic stroke (HCC) 02/01/2021   Acute ischemic stroke (HCC)    Leukocytosis    Dyslipidemia    Diabetes mellitus type 2 in obese (HCC)    Congestive heart failure (HCC)    Stroke (cerebrum) (HCC) 01/25/2021   Acute on chronic diastolic CHF (congestive heart failure) (HCC) 01/10/2021   Acute respiratory failure with hypoxemia (HCC) 01/10/2021   Pneumonia 01/09/2021   Osteoarthritis of ankle and foot 01/09/2021   Chronic diastolic CHF (congestive heart failure) (HCC) 01/09/2021   Chronic fatigue syndrome 01/09/2021   Hypertension associated with diabetes (HCC) 01/09/2021   Hyperlipidemia associated with type 2 diabetes mellitus (HCC) 01/09/2021   Hypothyroidism 01/09/2021   Diabetes (HCC) 04/14/2020    Orientation RESPIRATION BLADDER Height & Weight     Self, Time, Situation, Place  Normal Continent Weight: 175 lb 4.3 oz (79.5 kg) Height:  5\' 4"  (162.6 cm)  BEHAVIORAL SYMPTOMS/MOOD NEUROLOGICAL BOWEL NUTRITION STATUS      Continent Diet (regular)  AMBULATORY STATUS  COMMUNICATION OF NEEDS Skin   Supervision Verbally Normal                       Personal Care Assistance Level of Assistance  Bathing, Feeding, Dressing Bathing Assistance: Limited assistance Feeding assistance: Limited assistance Dressing Assistance: Limited assistance     Functional Limitations Info  Sight, Hearing, Speech Sight Info: Adequate (wears glasses for reading) Hearing Info: Impaired (Expressive difficulties , HOH) Speech Info: Adequate    SPECIAL CARE FACTORS FREQUENCY  PT (By licensed PT), OT (By licensed OT)     PT Frequency: 5 x a week OT Frequency: 5 x a week            Contractures Contractures Info: Not present    Additional Factors Info  Code Status, Allergies, Psychotropic Code Status Info: Full Code Allergies Info: none Psychotropic Info: buPROPion (WELLBUTRIN SR) 12 hr tablet 150 mg         Current Medications (03/09/2021):  This is the current hospital active medication list Current Facility-Administered Medications  Medication Dose Route Frequency Provider Last Rate Last Admin   acetaminophen (TYLENOL) tablet 325-650 mg  325-650 mg Oral Q4H PRN 05/10/2021, PA-C       aspirin chewable tablet 81 mg  81 mg Oral Daily Dartha Lodge N, PA-C   81 mg at 03/08/21 1017   buPROPion (WELLBUTRIN SR) 12 hr tablet 150 mg  150 mg Oral BID 05/09/21 N, PA-C   150 mg at 03/08/21 2226   carvedilol (COREG) tablet 3.125 mg  3.125 mg Oral  BID WC Jodi Geralds N, PA-C   3.125 mg at 03/09/21 0102   cephALEXin (KEFLEX) capsule 500 mg  500 mg Oral QID Dartha Lodge, PA-C   500 mg at 03/09/21 7253   furosemide (LASIX) tablet 40 mg  40 mg Oral BID Dartha Lodge, [place]   40 mg at 03/09/21 6644   levothyroxine (SYNTHROID) tablet 112 mcg  112 mcg Oral QAC breakfast Dartha Lodge, [place]   112 mcg at 03/09/21 0347   polyethylene glycol (MIRALAX / GLYCOLAX) packet 17 g  17 g Oral Daily Ford, Kelsey N, PA-C       potassium chloride SA (KLOR-CON) CR tablet 20 mEq  20  mEq Oral Daily Jodi Geralds N, PA-C   20 mEq at 03/08/21 1018   simvastatin (ZOCOR) tablet 40 mg  40 mg Oral q1800 Dartha Lodge, [place]   40 mg at 03/08/21 1709   tamsulosin (FLOMAX) capsule 0.4 mg  0.4 mg Oral QPC supper Dartha Lodge, PA-C   0.4 mg at 03/08/21 1709   Current Outpatient Medications  Medication Sig Dispense Refill   acetaminophen (TYLENOL) 325 MG tablet Take 1-2 tablets (325-650 mg total) by mouth every 4 (four) hours as needed for mild pain.     aspirin 81 MG chewable tablet Chew 1 tablet (81 mg total) by mouth daily. 360 tablet 0   buPROPion (WELLBUTRIN SR) 150 MG 12 hr tablet Take 1 tablet (150 mg total) by mouth 2 (two) times daily. 60 tablet 0   carvedilol (COREG) 3.125 MG tablet Take 1 tablet (3.125 mg total) by mouth 2 (two) times daily with a meal. 60 tablet 0   cephALEXin (KEFLEX) 500 MG capsule Take 1 capsule (500 mg total) by mouth 4 (four) times daily for 7 days. 28 capsule 0   furosemide (LASIX) 40 MG tablet Take 1 tablet (40 mg total) by mouth 2 (two) times daily. 60 tablet 0   levothyroxine (SYNTHROID) 112 MCG tablet Take 1 tablet (112 mcg total) by mouth daily before breakfast. 30 tablet 0   polyethylene glycol (MIRALAX / GLYCOLAX) 17 g packet Take 17 g by mouth daily. (Patient taking differently: Take 17 g by mouth daily as needed for mild constipation.) 30 each 0   potassium chloride SA (KLOR-CON) 20 MEQ tablet Take 1 tablet (20 mEq total) by mouth daily. 30 tablet 0   simvastatin (ZOCOR) 40 MG tablet Take 1 tablet (40 mg total) by mouth daily at 6 PM. 30 tablet 0   tamsulosin (FLOMAX) 0.4 MG CAPS capsule Take 1 capsule (0.4 mg total) by mouth daily after supper. 30 capsule 0     Discharge Medications: Please see discharge summary for a list of discharge medications.  Relevant Imaging Results:  Relevant Lab Results:   Additional Information SSN: [id number]  Valentina Shaggy Tymeir [place], LCSW

## 2021-03-09 NOTE — ED Notes (Signed)
Pt complaining of vaginal itching. Provider notified.

## 2021-03-09 NOTE — ED Notes (Signed)
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

## 2021-03-10 NOTE — ED Notes (Signed)
[facility] notified to transport patient from the hospital to [facility].

## 2021-03-10 NOTE — ED Notes (Signed)
Attempted to call [facility]. Call was sent to voicemail after 2 rings.

## 2021-03-10 NOTE — ED Provider Notes (Signed)
Emergency Medicine Observation Re-evaluation Note  Sue [name] is a 87 y.o. female, seen on rounds today.  Pt initially presented to the ED for complaints of Fall Currently, the patient is waiting for placement.  Physical Exam  BP 119/75   Pulse 85   Temp 98.1 F (36.7 C) (Oral)   Resp 18   Ht 5\' 4"  (1.626 m)   Wt 79.5 kg   SpO2 97%   BMI 30.08 kg/m  Physical Exam General: no acute distress Lungs: normal effort Psych: no agitation  ED Course / MDM  EKG:EKG Interpretation  Date/Time:  Saturday March 07 2021 11:17:44 EDT Ventricular Rate:  75 PR Interval:  152 QRS Duration: 138 QT Interval:  418 QTC Calculation: 466 R Axis:   14 Text Interpretation: Normal sinus rhythm Right bundle branch block Abnormal ECG Confirmed by [id number] [phone]) on 03/08/2021 1:45:18 PM  I have reviewed the labs performed to date as well as medications administered while in observation.  Recent changes in the last 24 hours: None.  Plan  Current plan is for Placement to [facility]. Patient is not under full IVC at this time.   [facility], MD 03/10/21 1251

## 2021-03-10 NOTE — Progress Notes (Signed)
CSW spoke with pt about d/c, pt stated she wanted to stay in [place].  CSW informed pt [facility] is in [place]. CSW informed pt she will be d/c today, going for short term rehab for PT and OT. CSW called to inform pt's son of d/c , received no response left VM.   Pt room 128A call report 1 floor nurse station [phone].   Valentina Shaggy.Zeke Aker, MSW, LCSWA [facility]  Transitions of Care Clinical Social Worker I Direct Dial: [phone]  Fax: [phone] [email or web address]

## 2021-03-10 NOTE — Discharge Instructions (Addendum)
If you develop worsening, recurrent, or continued back pain, numbness or weakness in the legs, incontinence of your bowels or bladders, numbness of your buttocks, fever, abdominal pain, or any other new/concerning symptoms then return to the ER for evaluation.  

## 2021-03-10 NOTE — ED Notes (Signed)
Patient pulled up in bed and sit up with breakfast tray in front of her. Water provided with medication and patient tolerated well.

## 2021-03-25 NOTE — Progress Notes (Signed)
[facility] Summary Report / Loop Recorder 

## 2021-04-06 DEATH — deceased

## 2021-06-10 ENCOUNTER — Inpatient Hospital Stay: Payer: HMO | Admitting: Neurology

## 2022-10-23 IMAGING — CT CT ANGIO HEAD-NECK (W OR W/O PERF)
3 of 7 series · 10 of 36 positions shown · IV contrast (OMNI 350)
Comparison: CT head 01/25/2021

CLINICAL DATA: Acute neuro deficit.  Right-sided weakness.

EXAM:
CT ANGIOGRAPHY HEAD AND NECK
TECHNIQUE: Multidetector CT imaging of the head and neck was performed using
the standard protocol during bolus administration of intravenous
contrast. Multiplanar CT image reconstructions and MIPs were
obtained to evaluate the vascular anatomy. Carotid stenosis
measurements (when applicable) are obtained utilizing NASCET
criteria, using the distal internal carotid diameter as the
denominator.
CONTRAST:  50mL OMNIPAQUE IOHEXOL 350 MG/ML SOLN

[Series 5: cta neck · axial · 0.47mm/px · z∈[-263,-145]mm · 2 of 179 slices shown]
[im 60/179  soft-tissue]
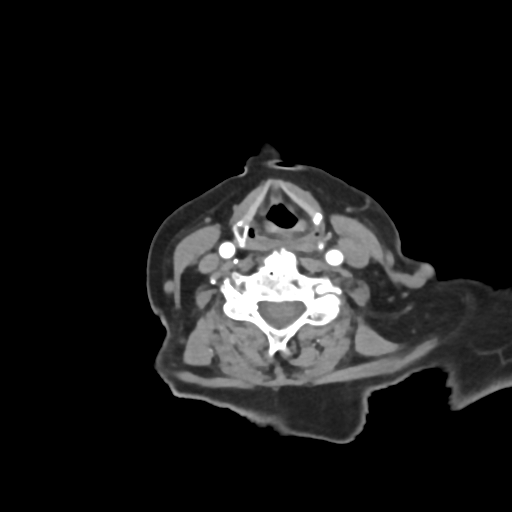
[im 119/179  soft-tissue]
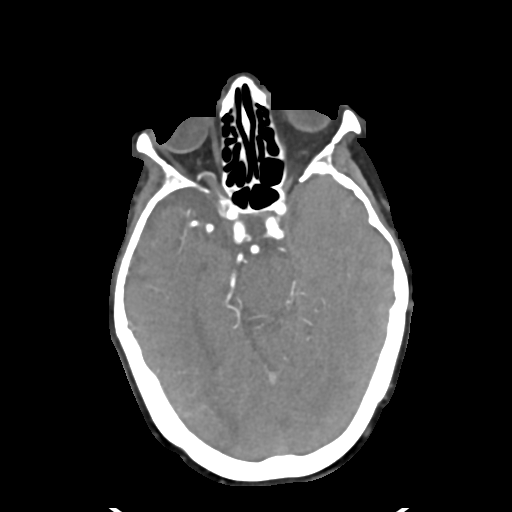

[Series 7: cta neck axial · axial · 0.39mm/px · z∈[-336,-85]mm · 6 of 348 slices shown]
[im 50/348  soft-tissue]
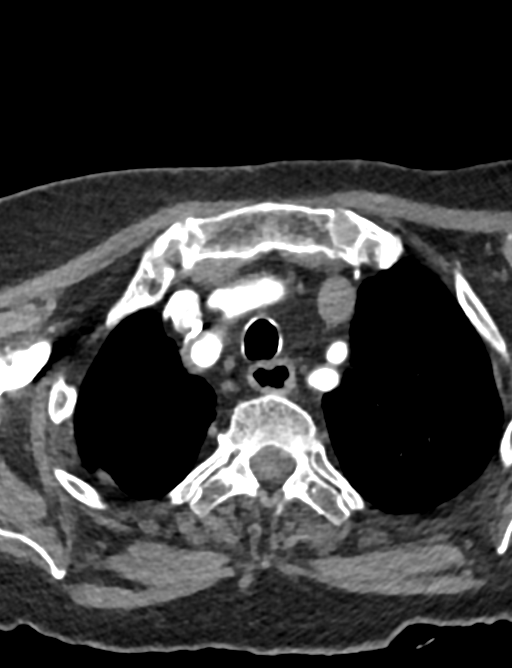
[im 100/348  bone]
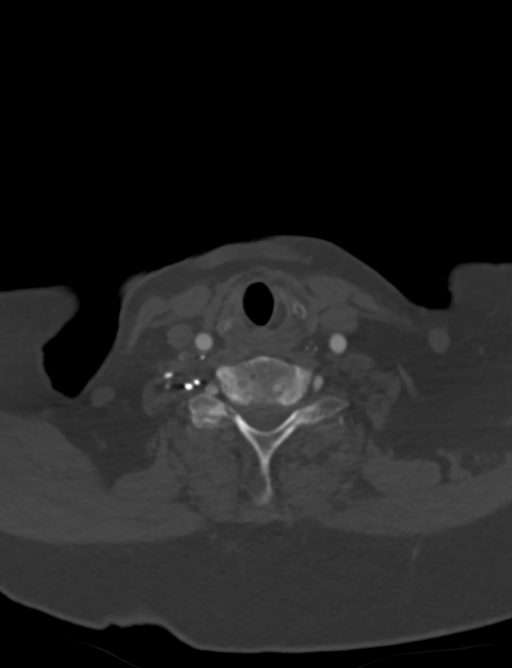
[im 149/348  soft-tissue]
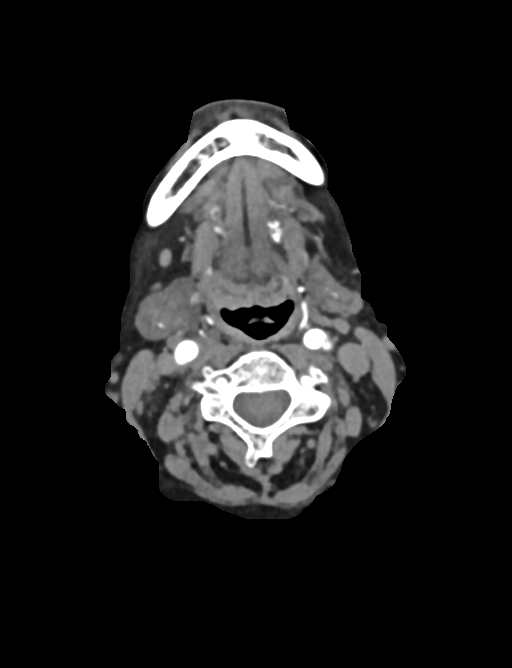
[im 199/348  bone]
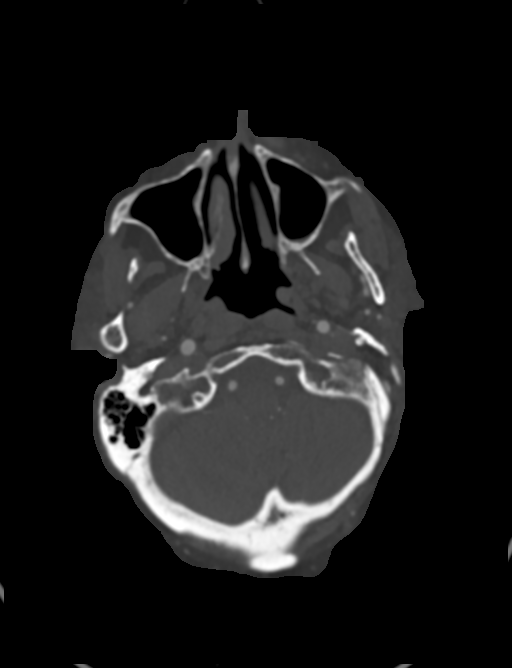
[im 248/348  soft-tissue]
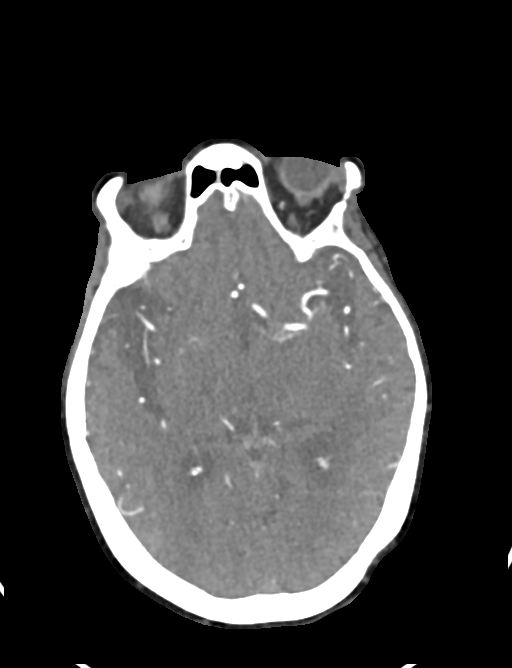
[im 298/348  bone]
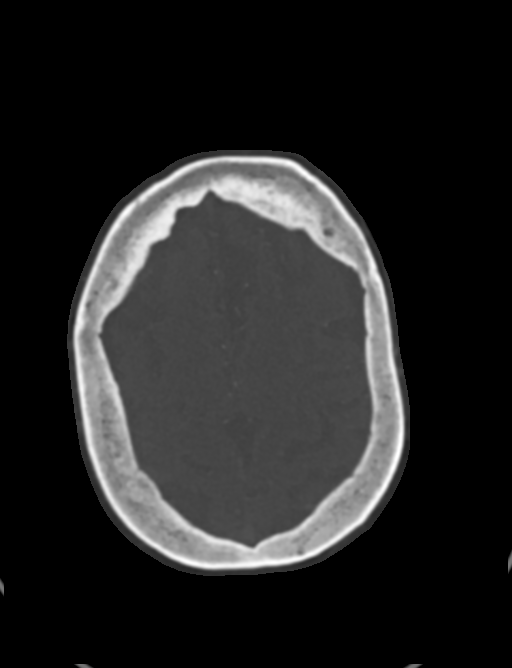

[Series 9: cta neck sagittal · sagittal · 0.51mm/px · 2 of 201 slices shown]
[im 15/201  soft-tissue]
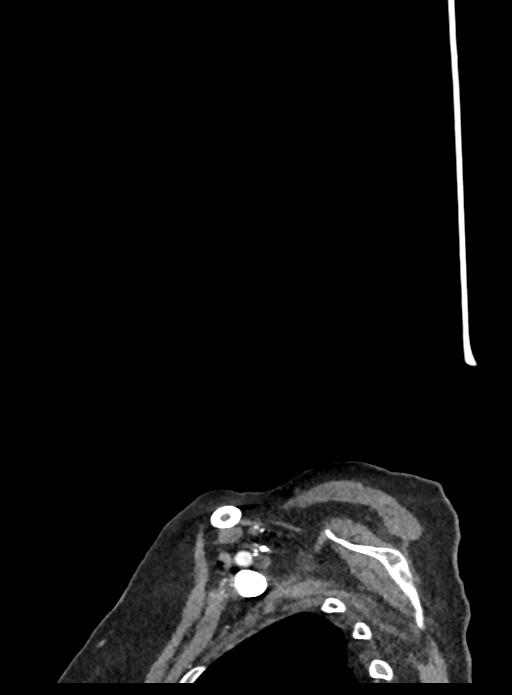
[im 187/201  soft-tissue]
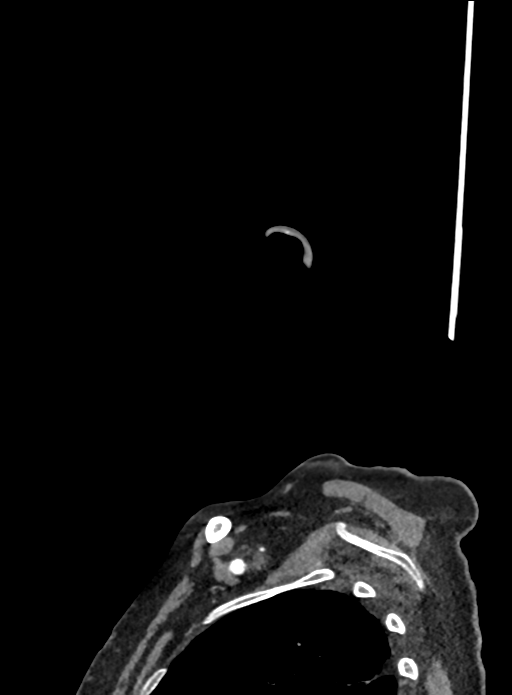

[10 of 36 positions shown; findings below may reference images not displayed]

FINDINGS: CTA NECK FINDINGS

Aortic arch: Mild atherosclerotic disease in the aortic arch.
Proximal great vessels widely patent.

Right carotid system: Right carotid widely patent without
significant stenosis.

Left carotid system: Left carotid widely patent without significant
stenosis.

Vertebral arteries: Both vertebral arteries are widely patent to the
basilar without stenosis.

Skeleton: Cervical spondylosis.  No acute abnormality.

Other neck: Negative for mass or adenopathy in the neck.

Upper chest: Lung apices clear bilaterally.

Review of the MIP images confirms the above findings

CTA HEAD FINDINGS

Anterior circulation: Cavernous carotid widely patent bilaterally.
Anterior and middle cerebral arteries patent bilaterally without
stenosis or large vessel occlusion.

Posterior circulation: Both vertebral arteries patent to the
basilar. PICA patent bilaterally. Basilar patent with mild
atherosclerotic irregularity. Anterior middle cerebral arteries
patent bilaterally. Mild stenosis in the right P2 segment. No large
vessel occlusion.

Venous sinuses: Normal venous enhancement.

Anatomic variants: None

Review of the MIP images confirms the above findings
IMPRESSION: 1. Negative for intracranial large vessel occlusion
2. No significant carotid or vertebral artery stenosis in the neck.

## 2024-04-05 ENCOUNTER — Other Ambulatory Visit (HOSPITAL_COMMUNITY): Payer: Self-pay
